# Patient Record
Sex: Female | Born: 1980 | Race: Black or African American | Hispanic: No | Marital: Single | Smoking: Current every day smoker
Health system: Southern US, Community
[De-identification: ages and names within clinical notes are randomized; demographics above are authoritative.]

## PROBLEM LIST (undated history)

## (undated) ENCOUNTER — Ambulatory Visit: Admission: EM | Payer: Medicare Other | Source: Home / Self Care

## (undated) DIAGNOSIS — R609 Edema, unspecified: Secondary | ICD-10-CM

## (undated) DIAGNOSIS — T884XXA Failed or difficult intubation, initial encounter: Secondary | ICD-10-CM

## (undated) DIAGNOSIS — E876 Hypokalemia: Secondary | ICD-10-CM

## (undated) DIAGNOSIS — R0600 Dyspnea, unspecified: Secondary | ICD-10-CM

## (undated) DIAGNOSIS — R55 Syncope and collapse: Secondary | ICD-10-CM

## (undated) DIAGNOSIS — R768 Other specified abnormal immunological findings in serum: Secondary | ICD-10-CM

## (undated) DIAGNOSIS — F329 Major depressive disorder, single episode, unspecified: Secondary | ICD-10-CM

## (undated) DIAGNOSIS — I890 Lymphedema, not elsewhere classified: Secondary | ICD-10-CM

## (undated) DIAGNOSIS — N840 Polyp of corpus uteri: Secondary | ICD-10-CM

## (undated) DIAGNOSIS — E559 Vitamin D deficiency, unspecified: Secondary | ICD-10-CM

## (undated) DIAGNOSIS — R600 Localized edema: Secondary | ICD-10-CM

## (undated) DIAGNOSIS — N920 Excessive and frequent menstruation with regular cycle: Secondary | ICD-10-CM

## (undated) DIAGNOSIS — G894 Chronic pain syndrome: Secondary | ICD-10-CM

## (undated) DIAGNOSIS — G8929 Other chronic pain: Secondary | ICD-10-CM

## (undated) DIAGNOSIS — M419 Scoliosis, unspecified: Secondary | ICD-10-CM

## (undated) DIAGNOSIS — G4733 Obstructive sleep apnea (adult) (pediatric): Secondary | ICD-10-CM

## (undated) DIAGNOSIS — K2970 Gastritis, unspecified, without bleeding: Secondary | ICD-10-CM

## (undated) DIAGNOSIS — F32A Depression, unspecified: Secondary | ICD-10-CM

## (undated) DIAGNOSIS — E538 Deficiency of other specified B group vitamins: Secondary | ICD-10-CM

## (undated) DIAGNOSIS — R7303 Prediabetes: Secondary | ICD-10-CM

## (undated) DIAGNOSIS — R0609 Other forms of dyspnea: Secondary | ICD-10-CM

## (undated) DIAGNOSIS — F419 Anxiety disorder, unspecified: Secondary | ICD-10-CM

## (undated) DIAGNOSIS — N3281 Overactive bladder: Secondary | ICD-10-CM

## (undated) DIAGNOSIS — I509 Heart failure, unspecified: Secondary | ICD-10-CM

## (undated) DIAGNOSIS — J45909 Unspecified asthma, uncomplicated: Secondary | ICD-10-CM

## (undated) DIAGNOSIS — R000 Tachycardia, unspecified: Secondary | ICD-10-CM

## (undated) DIAGNOSIS — M45A8 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: Secondary | ICD-10-CM

## (undated) DIAGNOSIS — R0902 Hypoxemia: Secondary | ICD-10-CM

## (undated) DIAGNOSIS — K219 Gastro-esophageal reflux disease without esophagitis: Secondary | ICD-10-CM

## (undated) DIAGNOSIS — Z9884 Bariatric surgery status: Secondary | ICD-10-CM

## (undated) DIAGNOSIS — M47817 Spondylosis without myelopathy or radiculopathy, lumbosacral region: Secondary | ICD-10-CM

## (undated) DIAGNOSIS — D509 Iron deficiency anemia, unspecified: Secondary | ICD-10-CM

## (undated) HISTORY — DX: Hypoxemia: R09.02

## (undated) HISTORY — PX: OTHER SURGICAL HISTORY: SHX169

## (undated) HISTORY — PX: HERNIA REPAIR: SHX51

## (undated) HISTORY — PX: TUBAL LIGATION: SHX77

---

## 2004-10-28 ENCOUNTER — Emergency Department: Payer: Self-pay | Admitting: Emergency Medicine

## 2004-10-28 IMAGING — CT CT HEAD WITHOUT CONTRAST
1 series · 16 of 28 positions shown, 20 images · non-contrast
Comparison: none

REASON FOR EXAM: Headache
COMMENTS:  LMP: N/A

PROCEDURE:     CT  - CT HEAD WITHOUT CONTRAST  - [DATE]  [DATE]
RESULT:     There is no evidence of intra-axial or extra-axial fluid
collections or evidence of acute hemorrhage. No secondary signs are
appreciated to suggest mass effect or subacute or chronic infarction.

[Series 2: without · axial · non-contrast · 0.42mm/px · z∈[-175,-50]mm · 16 of 28 slices shown, 20 images]
[im 2/28  brain]
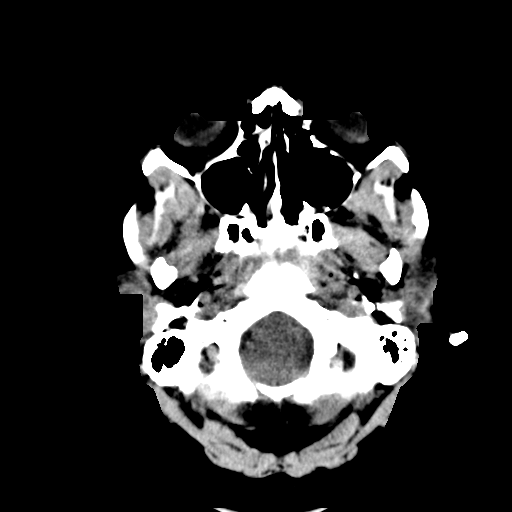
[im 2/28  bone]
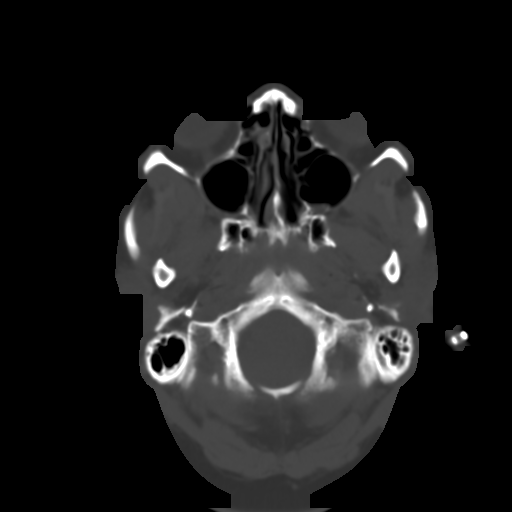
[im 4/28  brain]
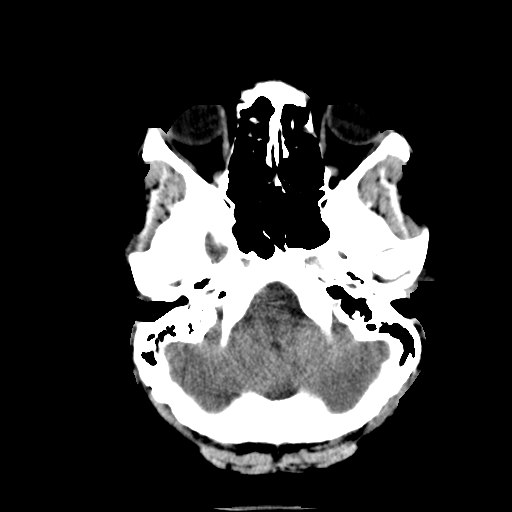
[im 6/28  brain]
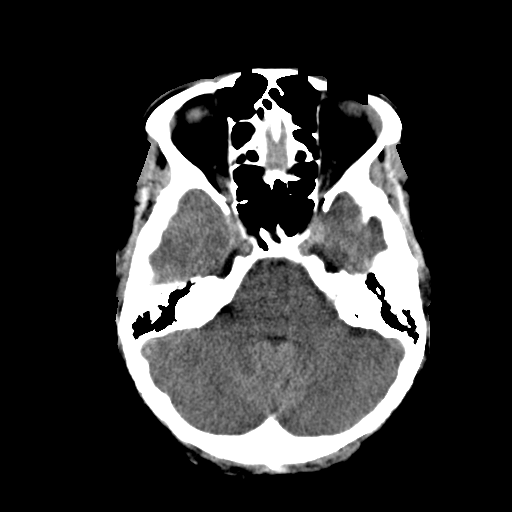
[im 7/28  brain]
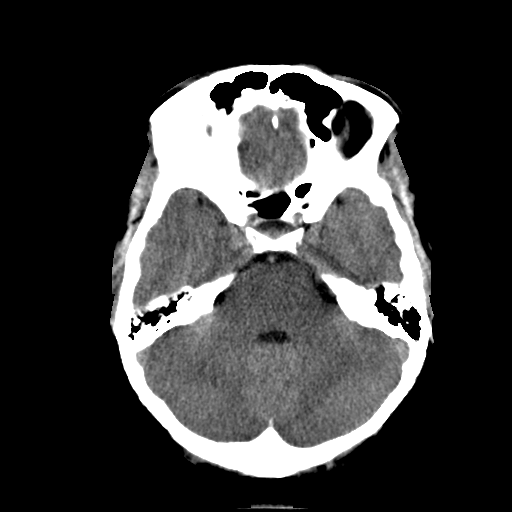
[im 9/28  brain]
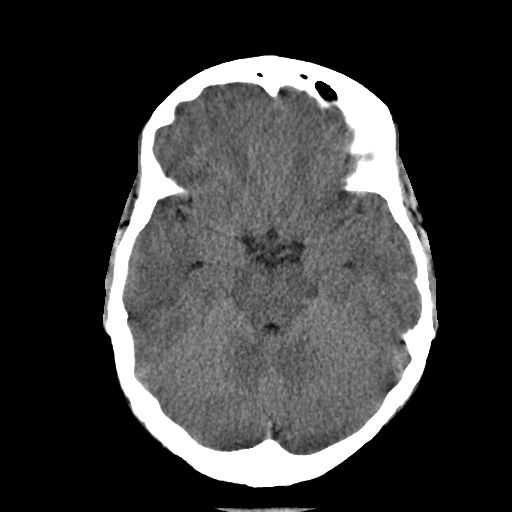
[im 9/28  bone]
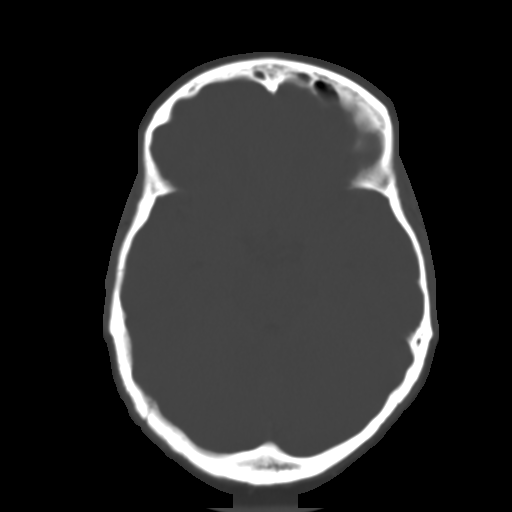
[im 10/28  brain]
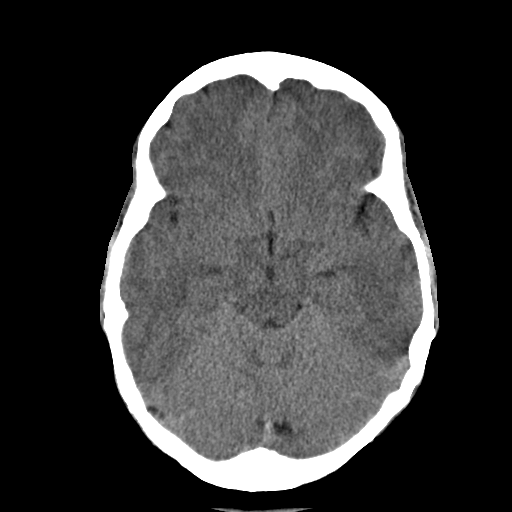
[im 12/28  brain]
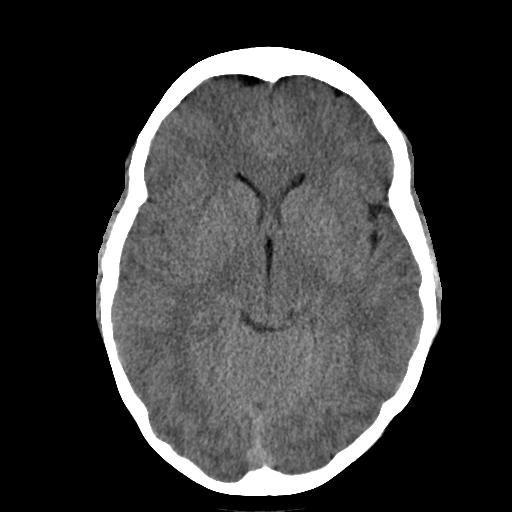
[im 14/28  brain]
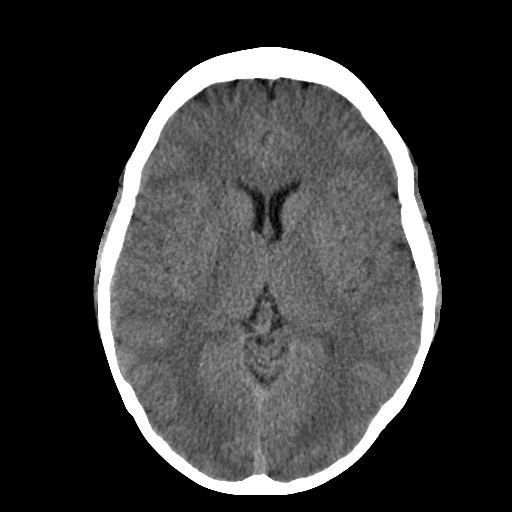
[im 15/28  brain]
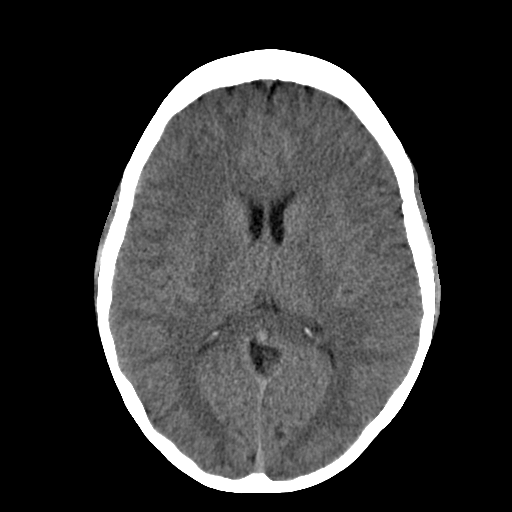
[im 15/28  bone]
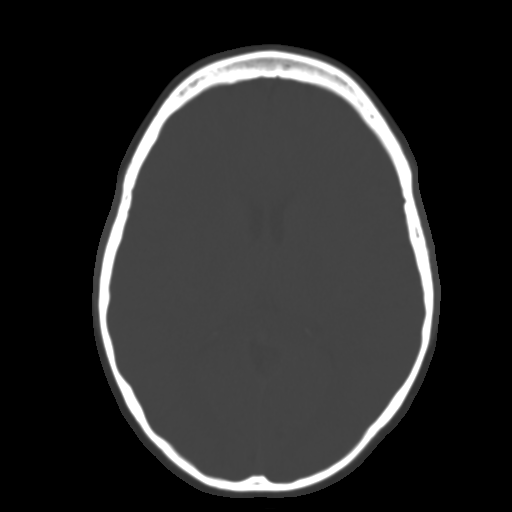
[im 17/28  brain]
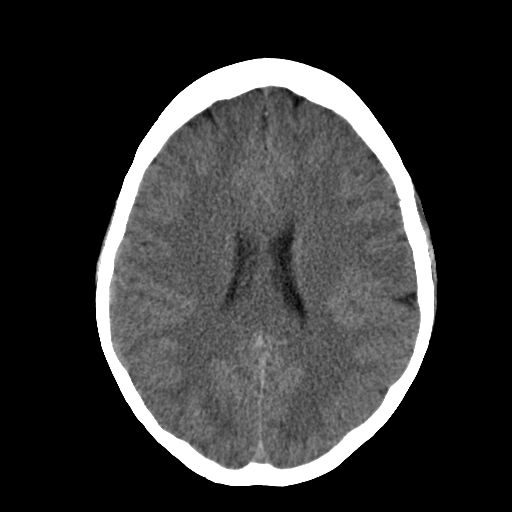
[im 19/28  brain]
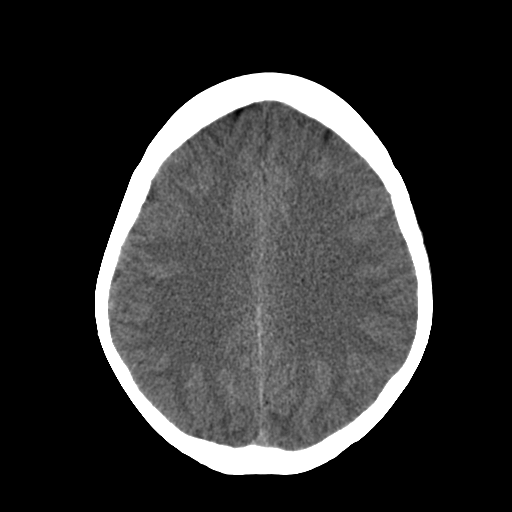
[im 20/28  brain]
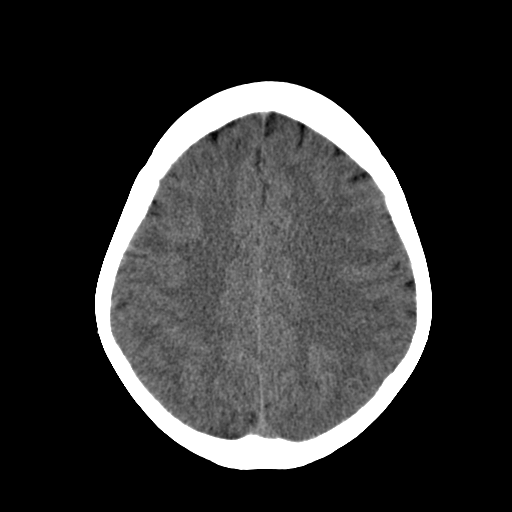
[im 22/28  brain]
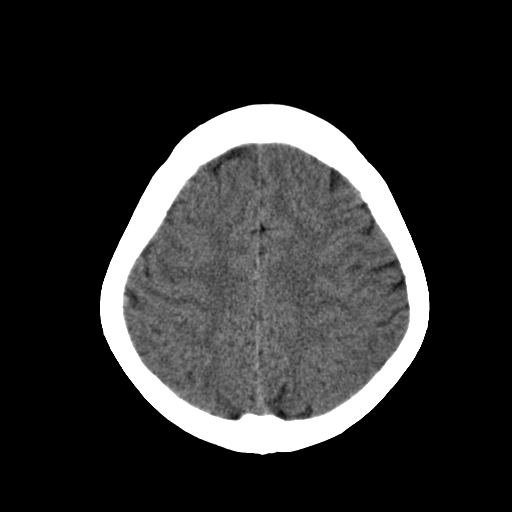
[im 22/28  bone]
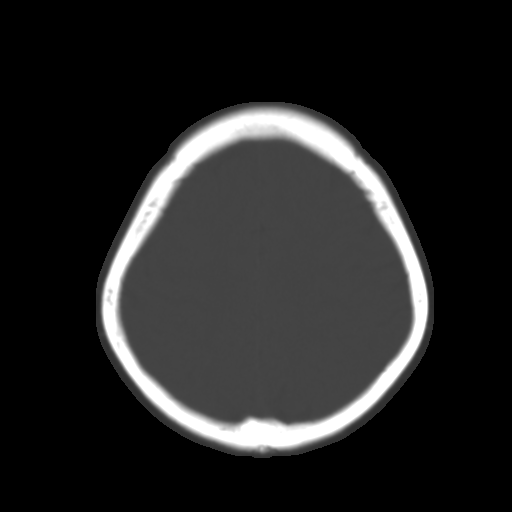
[im 23/28  brain]
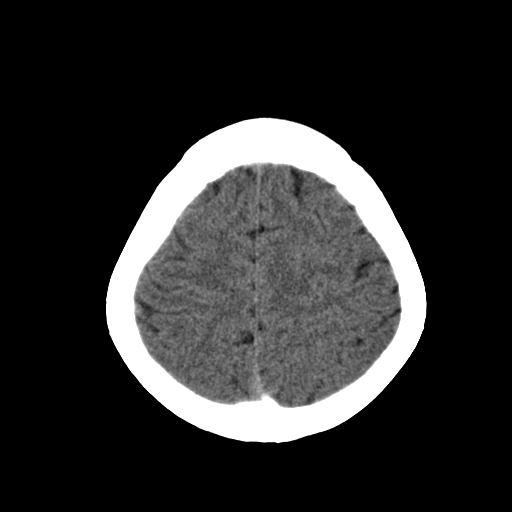
[im 25/28  brain]
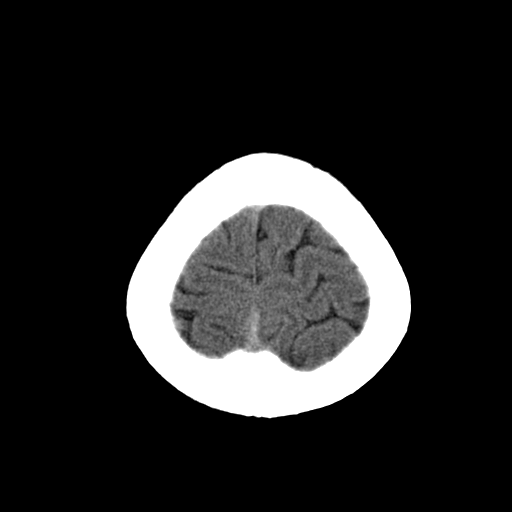
[im 27/28  brain]
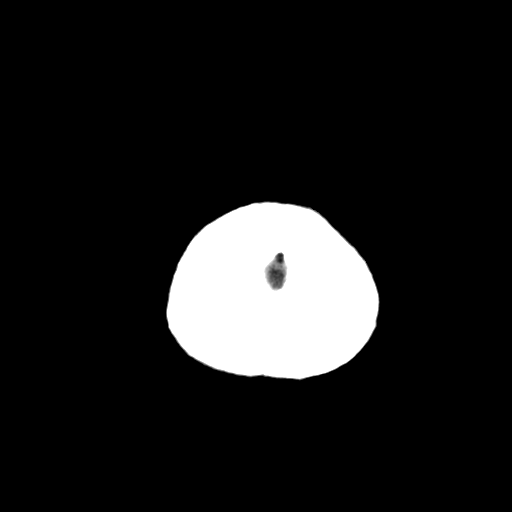

[16 of 28 positions shown; findings below may reference images not displayed]

IMPRESSION: 1.     Unremarkable emergent head CT as described above.
2.     Dr. TIGER of the Emergency Department was informed of these
findings at the time of the initial interpretation.

## 2006-01-06 ENCOUNTER — Emergency Department: Payer: Self-pay | Admitting: Emergency Medicine

## 2006-08-29 ENCOUNTER — Emergency Department: Payer: Self-pay | Admitting: Emergency Medicine

## 2006-11-26 ENCOUNTER — Ambulatory Visit: Payer: Self-pay | Admitting: Certified Nurse Midwife

## 2006-11-26 IMAGING — US US OB US >=[ID] SNGL FETUS
1 series · 17 of 28 positions shown · non-contrast
Comparison: none

REASON FOR EXAM: Anatomy and Placenta Location
COMMENTS:

[Series 1: us ob us >=(id) sngl fetus · 17 of 58 slices shown]
[im 1/58]
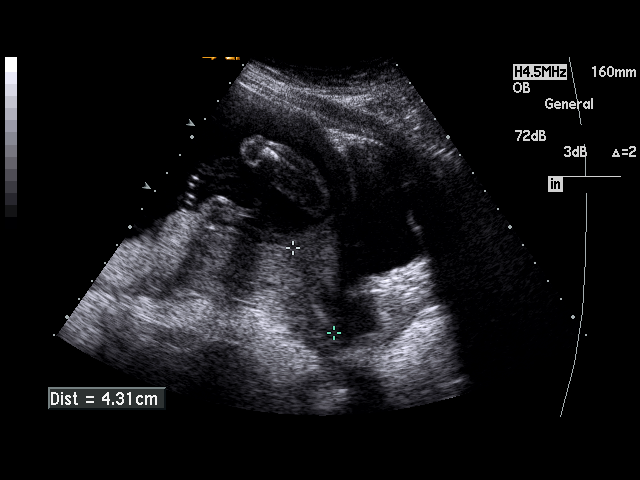
[im 5/58]
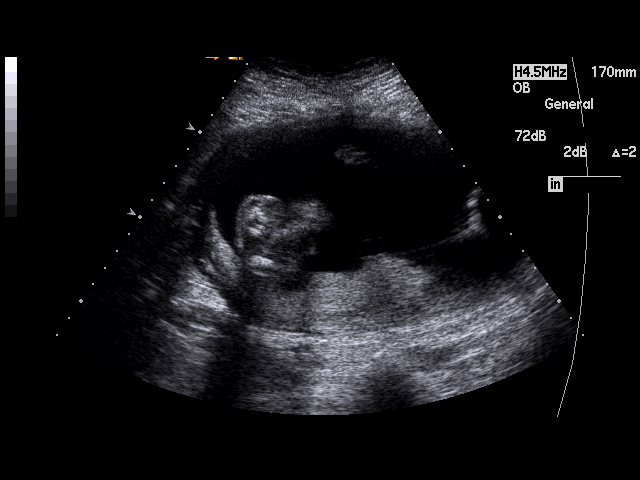
[im 9/58]
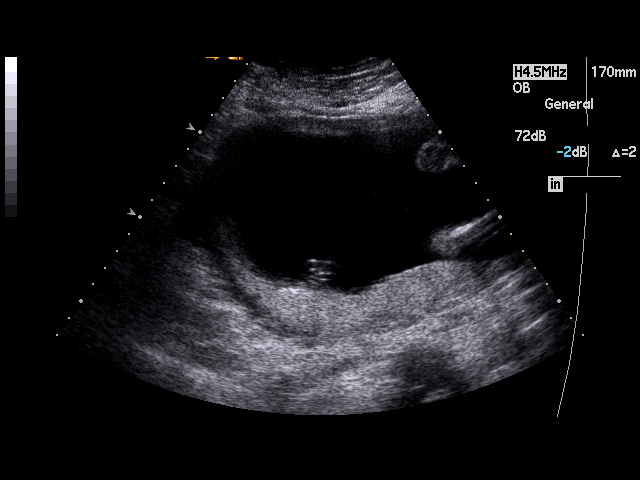
[im 11/58]
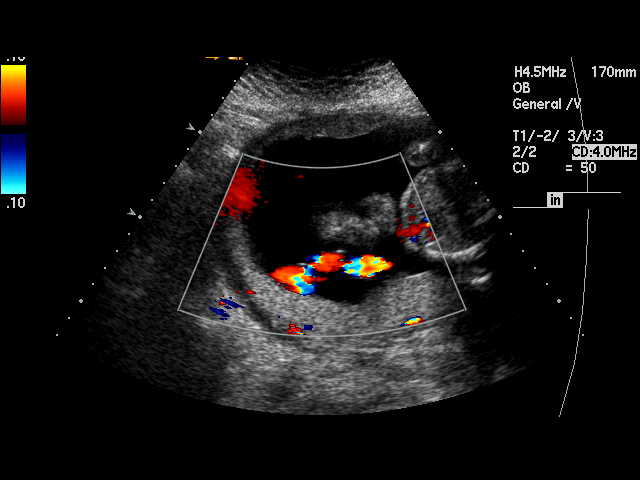
[im 15/58]
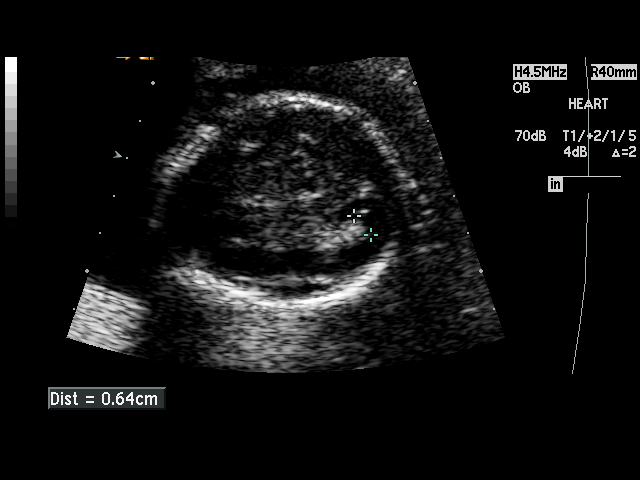
[im 20/58]
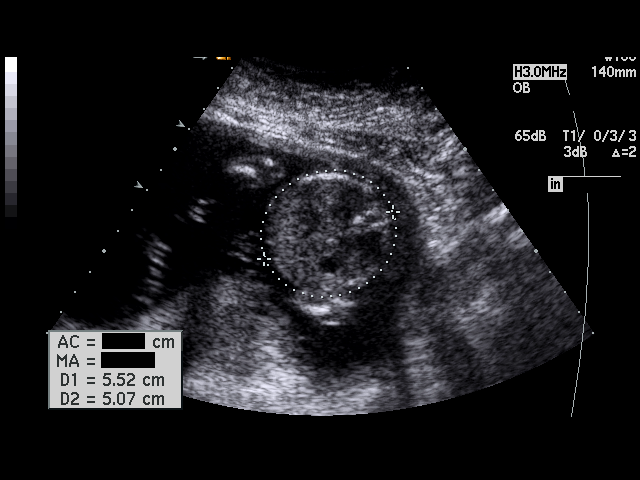
[im 22/58]
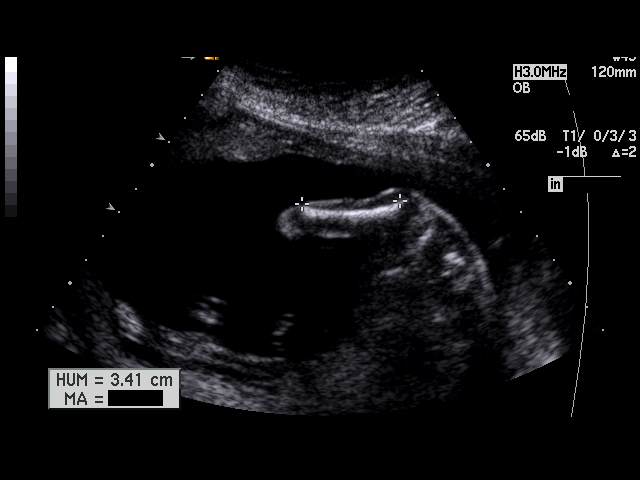
[im 26/58]
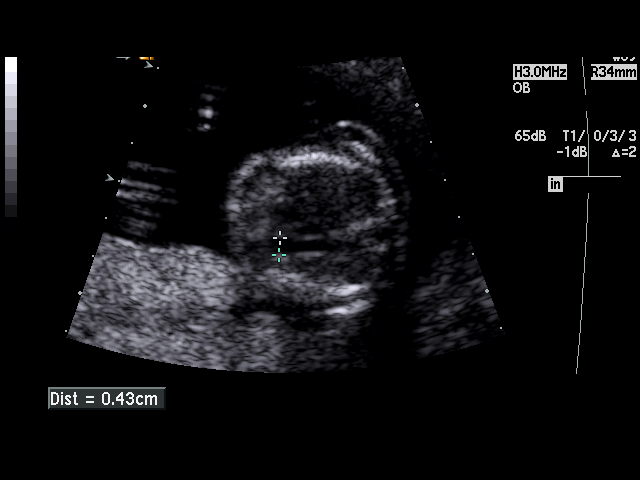
[im 30/58]
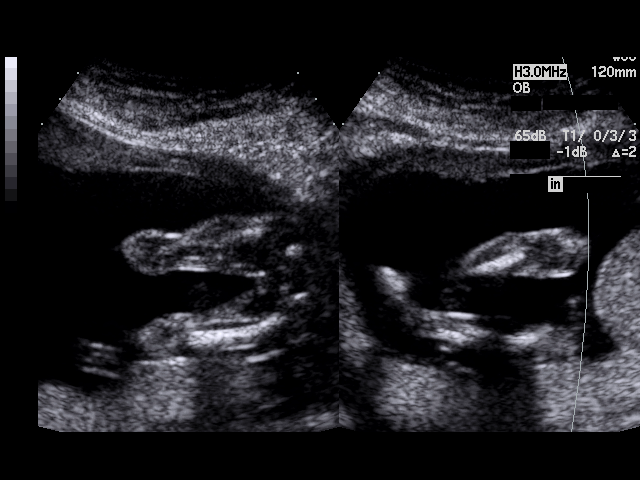
[im 32/58]
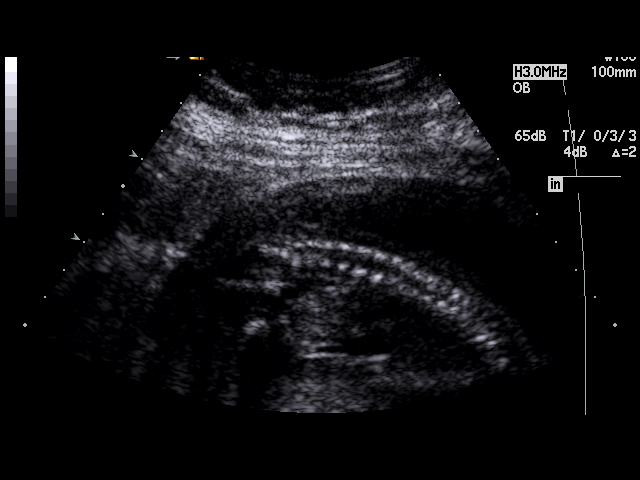
[im 36/58]
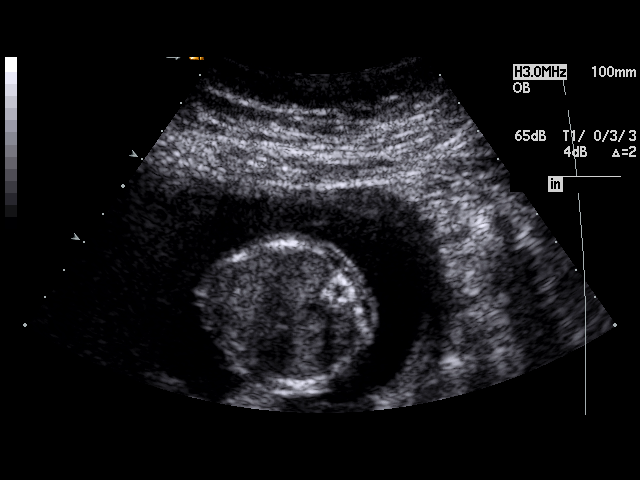
[im 39/58]
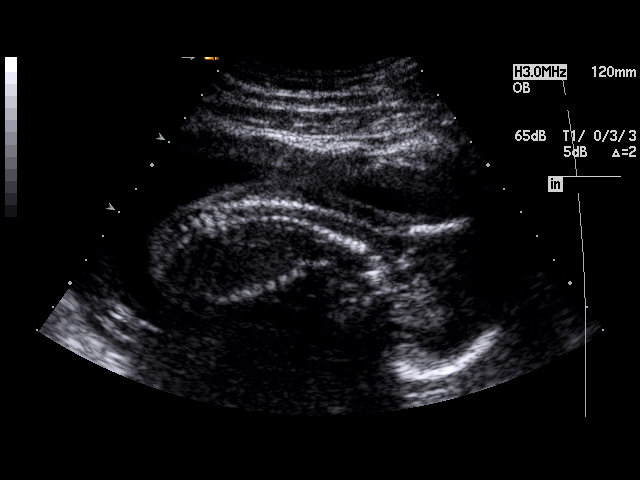
[im 43/58]
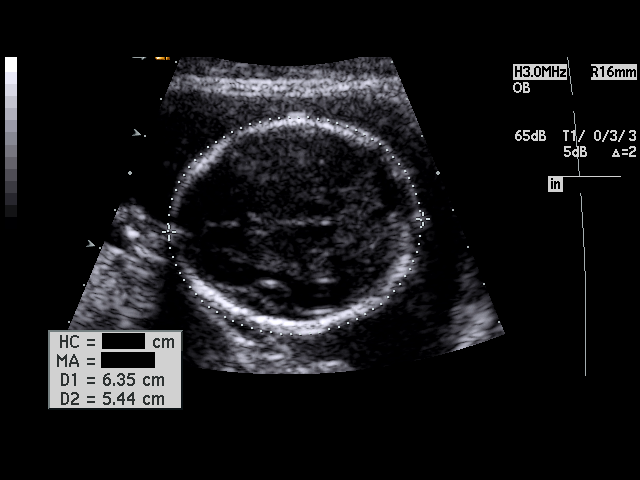
[im 47/58]
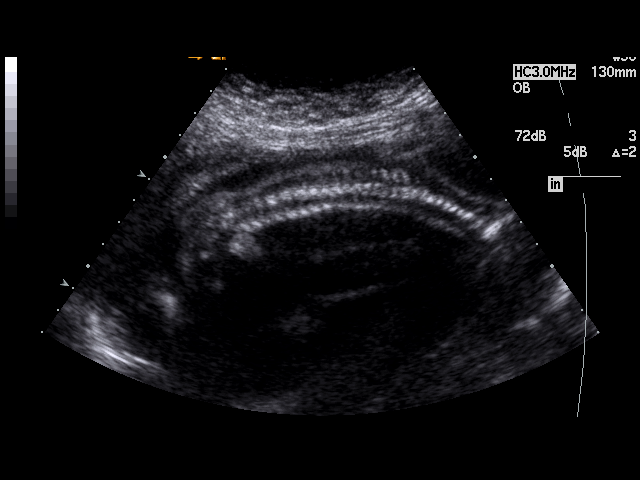
[im 49/58]
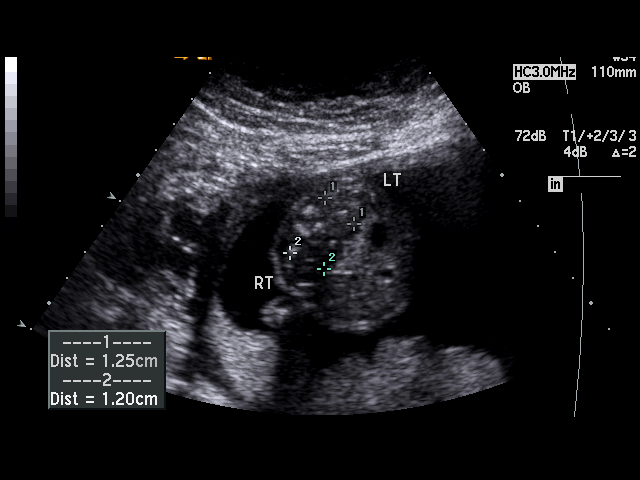
[im 53/58]
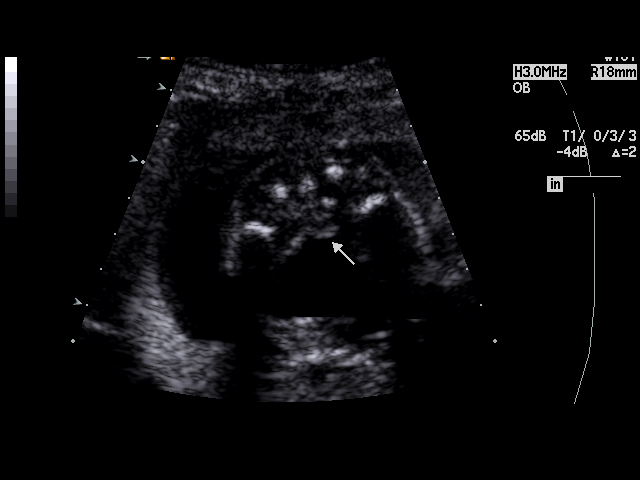
[im 58/58]
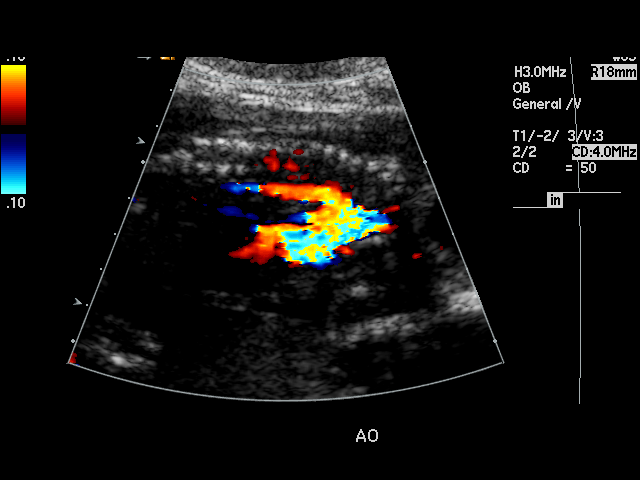

[17 of 28 positions shown; findings below may reference images not displayed]

PROCEDURE:     US  - US OB GREATER/OR EQUAL TO [XU]  - [DATE] [DATE]

RESULT:     There is noted a single living intrauterine gestation.
Presentation is variable during the course of this exam. Fetal heart rate
was monitored at 141 beats per minute. Amnionic fluid volume appears normal.
The placenta is posterior, to the RIGHT and high. The fetal heart, stomach,
and urinary bladder are visualized. No hydrocephalus or hydronephrosis is
seen. Fetal measurements are as follows:

BPD: 5.06 cm (21 [XU] days)
HC: 18.56 cm (20 [XU] days)
AC: 16.43 cm (21 [XU] days)
FL 3.27 cm (20 [XU] days)

EFW = 414 grams. Average ultrasound age is 21 [XU] day. Ultrasound EDD is
[DATE].
IMPRESSION: 1.     Please see above.

## 2007-02-06 ENCOUNTER — Ambulatory Visit: Payer: Self-pay | Admitting: Certified Nurse Midwife

## 2007-02-06 IMAGING — US US OB US >=[ID] SNGL FETUS
1 series · 17 of 28 positions shown · non-contrast
Comparison: none

REASON FOR EXAM: EFW  growth
COMMENTS:

[Series 1: us ob us >=(id) sngl fetus · 17 of 37 slices shown]
[im 1/37]
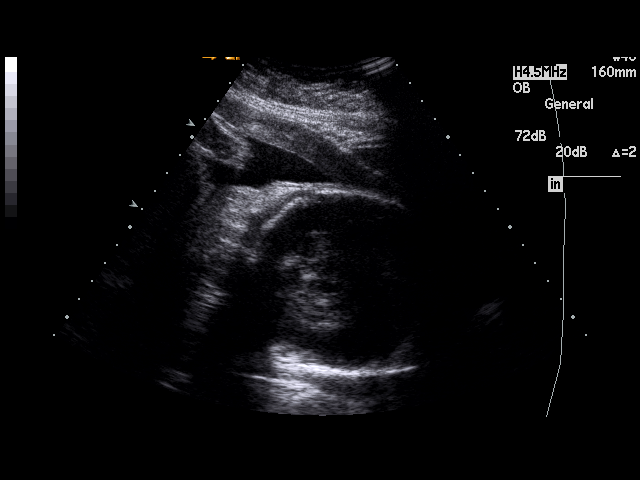
[im 3/37]
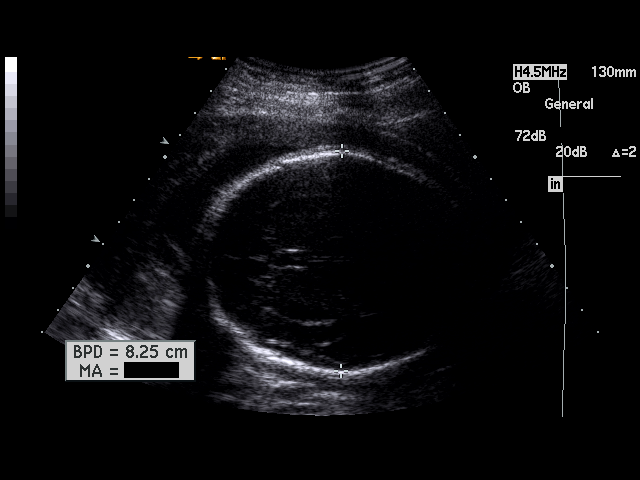
[im 6/37]
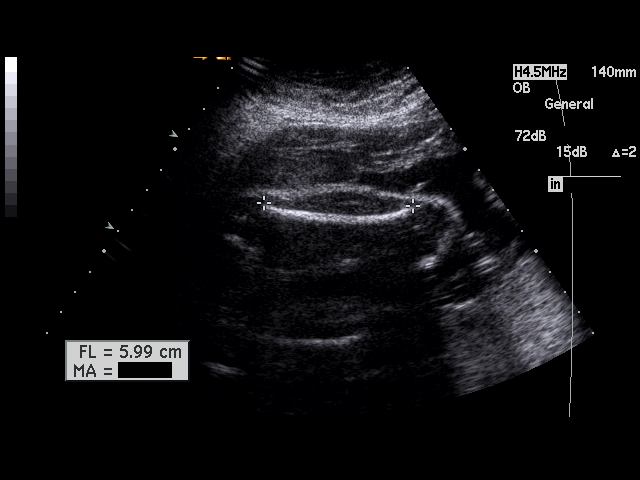
[im 7/37]
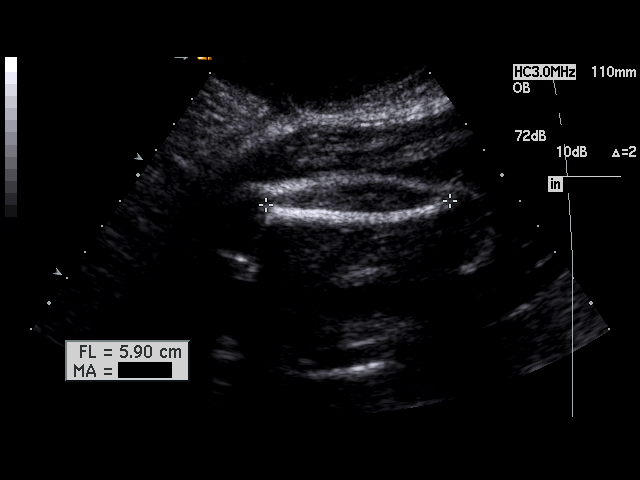
[im 10/37]
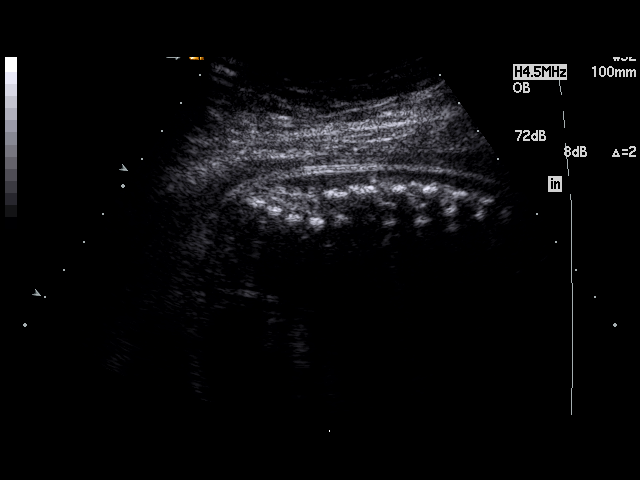
[im 13/37]
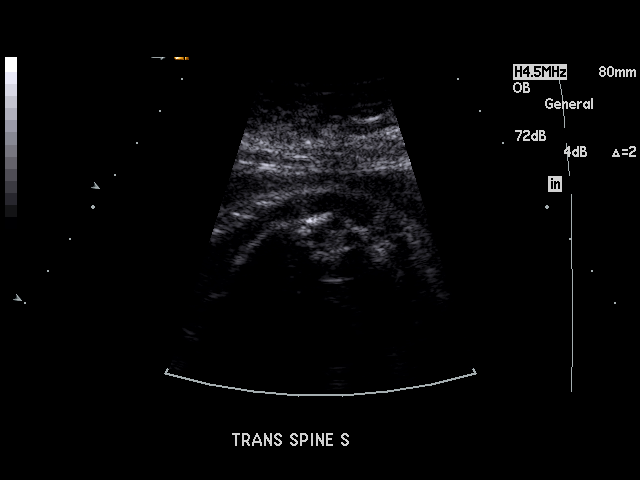
[im 14/37]
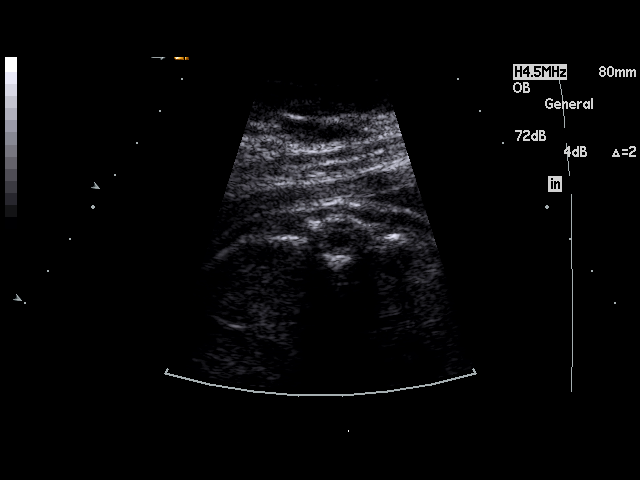
[im 17/37]
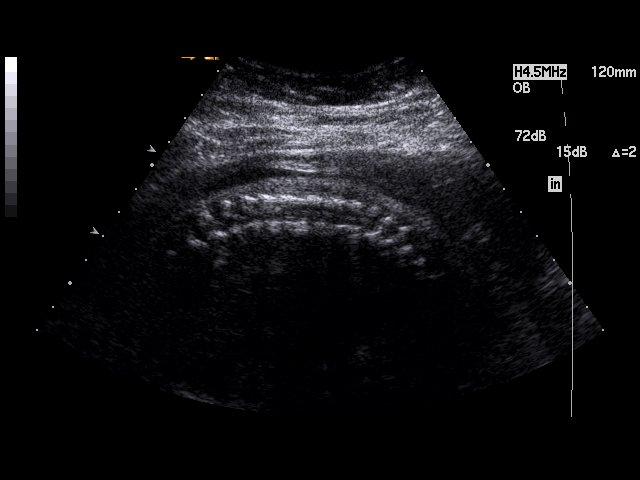
[im 19/37]
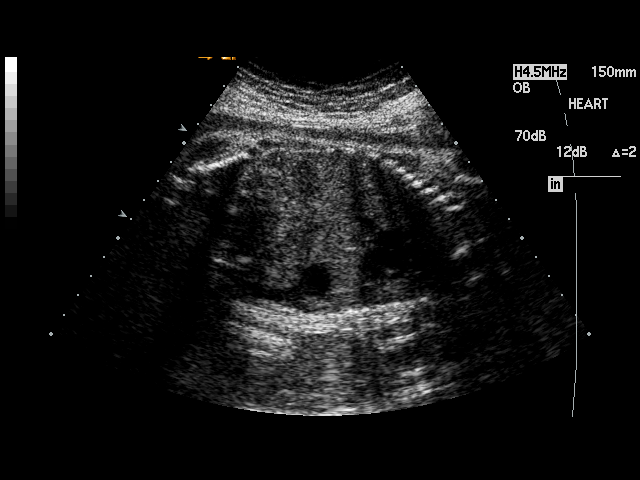
[im 21/37]
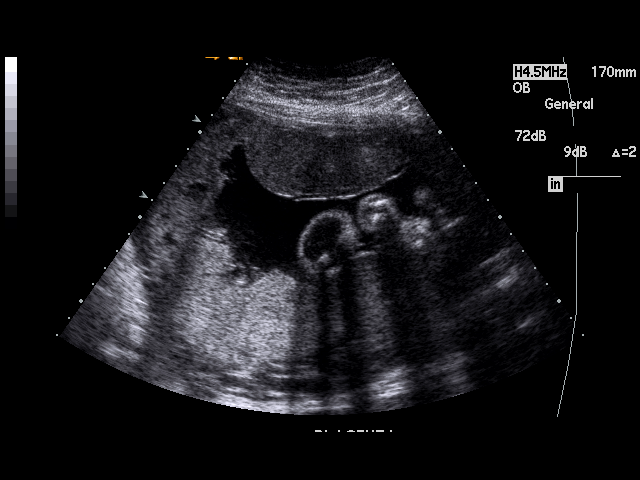
[im 23/37]
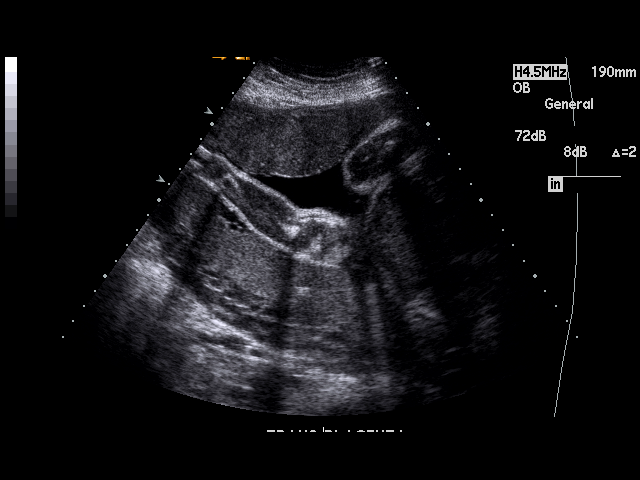
[im 25/37]
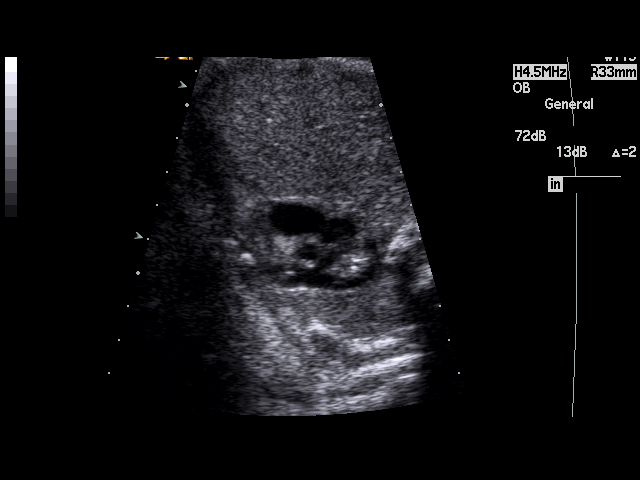
[im 27/37]
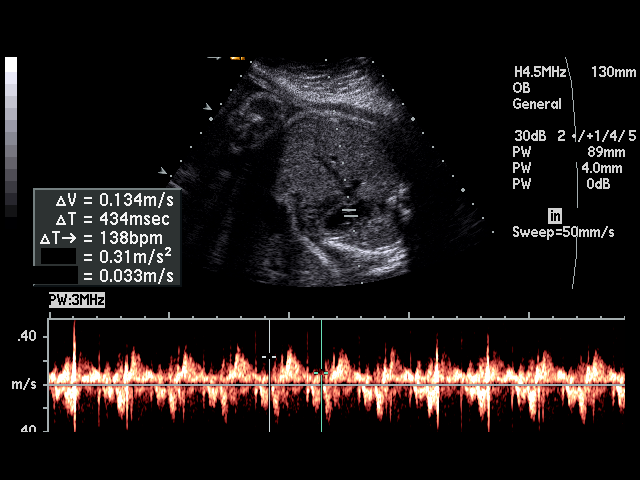
[im 30/37]
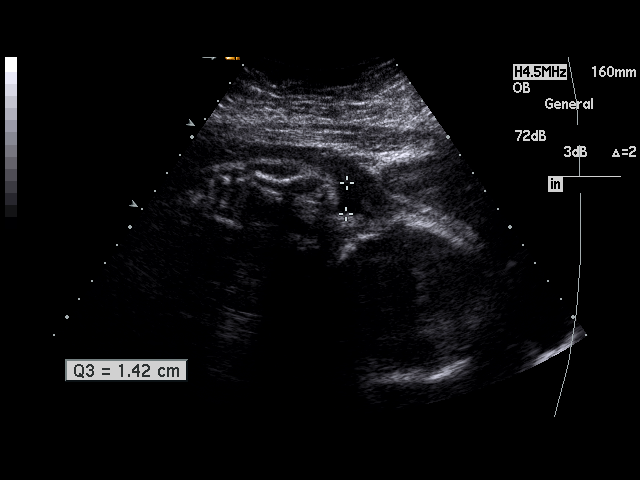
[im 31/37]
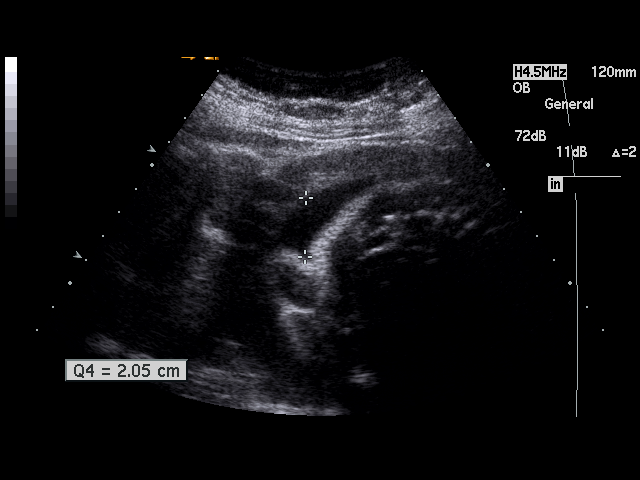
[im 34/37]
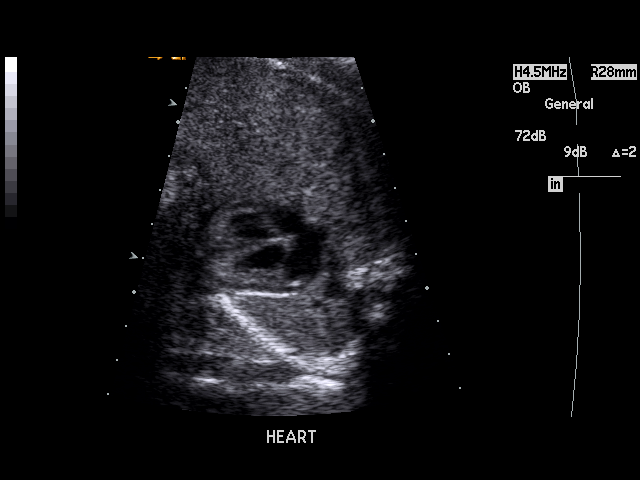
[im 37/37]
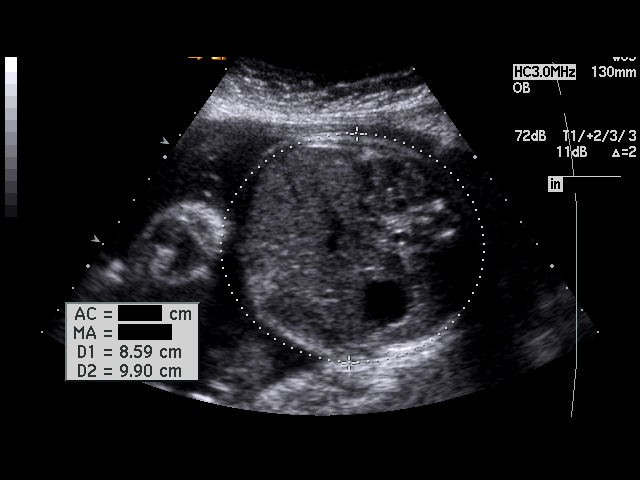

[17 of 28 positions shown; findings below may reference images not displayed]

PROCEDURE:     US  - US OB GREATER/OR EQUAL TO [W3]  - [DATE]  [DATE]

RESULT:     There is observed a living intrauterine gestation. Presentation
currently is cephalic. Amnionic fluid volume appears normal. The placenta is
fundal and to the RIGHT.  Fetal heart rate was monitored at 138 beats per
minute. The fetal heart, stomach and urinary bladder are visualized.  No
hydrocephalus or hydronephrosis is seen. Fetal measurements are as follows:

BPD: 8.21 cm (33 [W3] days)
HC: 29.44 cm (32 [W3] days)
AC: 29.09 cm (33 [W3] day)
FL: 5.91 cm (30 [W3] days)

EFW is 1,957 grams.  AFI measures 11.49 cm which is between the 5th and 50th
percentile. Average ultrasound age based on today's measurements is 32
[W3] days. Ultrasound EDD is [DATE]. This estimation as compared
with previous ultrasound EDD report on the prior exam of [DATE] is
compatible with appropriate interval growth.
IMPRESSION: 1.     Please see above.

## 2007-04-14 ENCOUNTER — Observation Stay: Payer: Self-pay | Admitting: Obstetrics and Gynecology

## 2007-04-14 ENCOUNTER — Inpatient Hospital Stay: Payer: Self-pay

## 2007-05-16 ENCOUNTER — Inpatient Hospital Stay: Payer: Self-pay | Admitting: Vascular Surgery

## 2007-05-16 IMAGING — CT CT ABD-PELV W/ CM
1 of 2 series · 15 of 32 positions shown, 19 images · non-contrast
Comparison: none

REASON FOR EXAM: (1) abd pain; (2) abd pain
COMMENTS:

PROCEDURE:     CT  - CT ABDOMEN / PELVIS  W  - [DATE]  [DATE]
RESULT:     Comparison: No available comparison exam.
TECHNIQUE: CT examination of the abdomen and pelvis was performed after
intravenous administration of 100 ml of [29] nonionic contrast in
addition to oral contrast. Collimation is 3 mm.

[Series 2: abd/pelvis · axial · 0.83mm/px · z∈[-416,-47]mm · 15 of 135 slices shown, 19 images]
[im 6/135  soft-tissue]
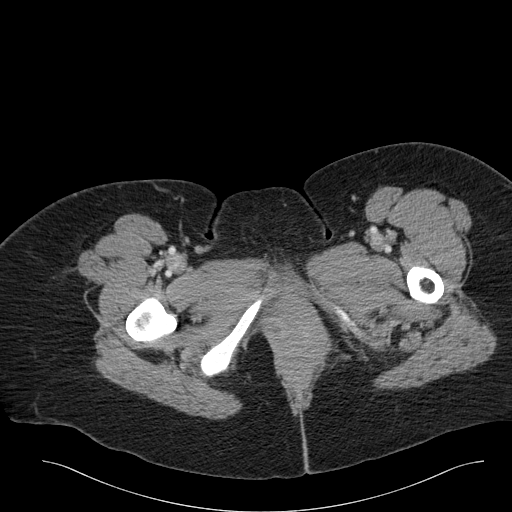
[im 6/135  bone]
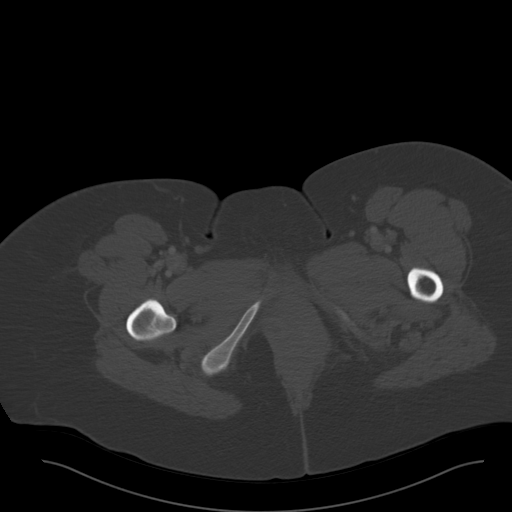
[im 17/135  soft-tissue]
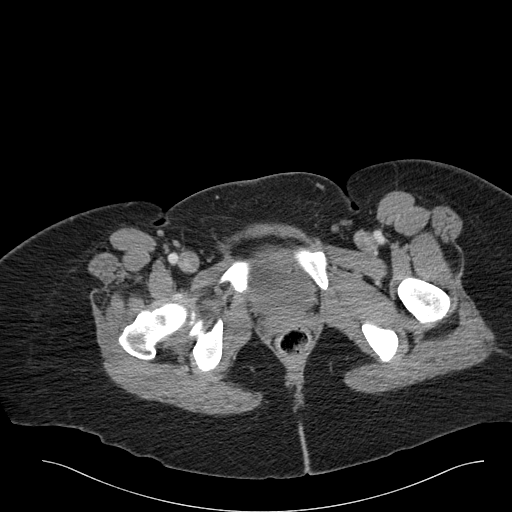
[im 28/135  soft-tissue]
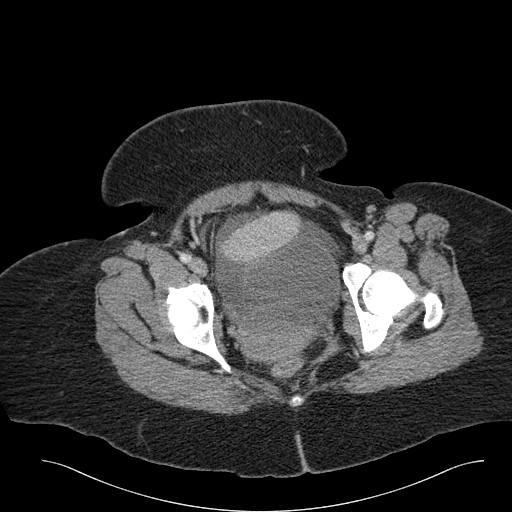
[im 40/135  soft-tissue]
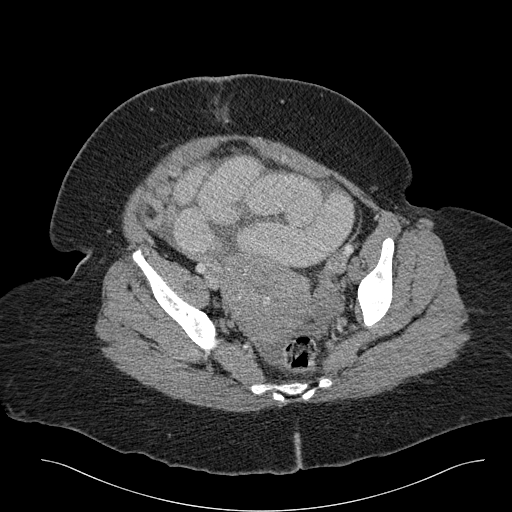
[im 45/135  soft-tissue]
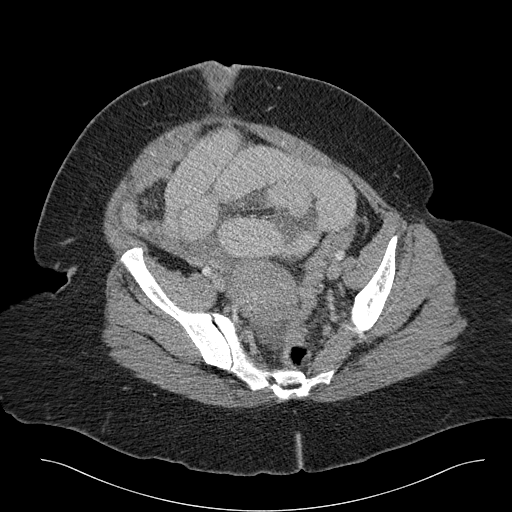
[im 56/135  soft-tissue]
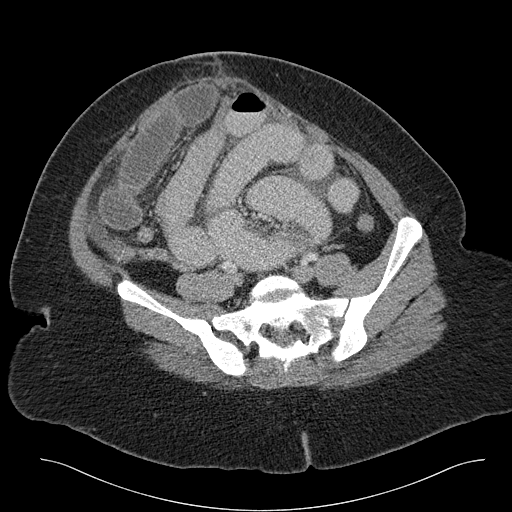
[im 68/135  soft-tissue]
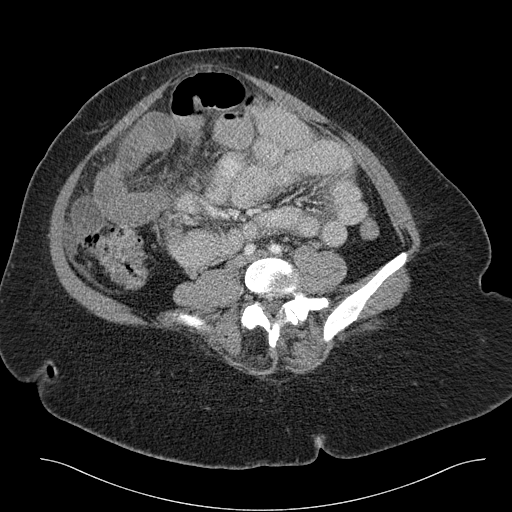
[im 79/135  soft-tissue]
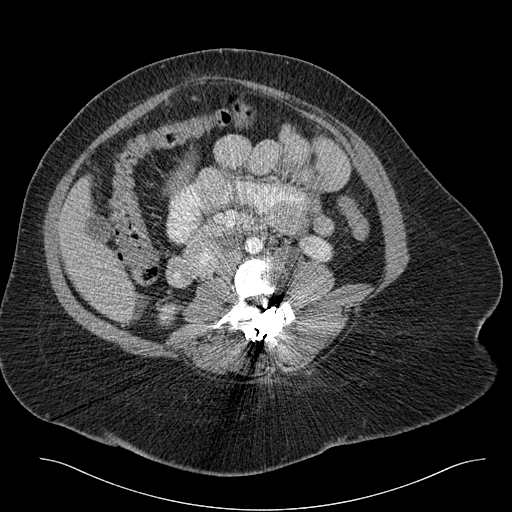
[im 90/135  soft-tissue]
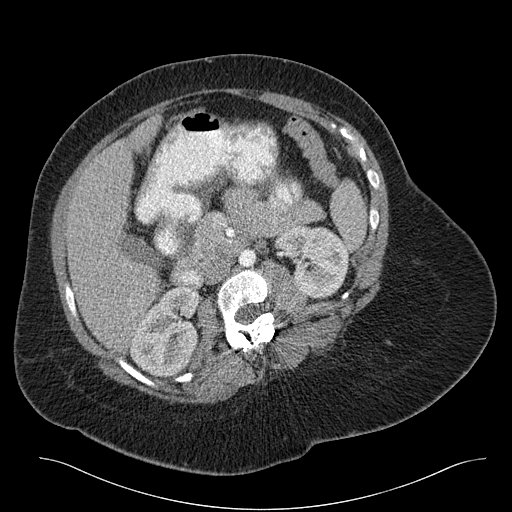
[im 90/135  bone]
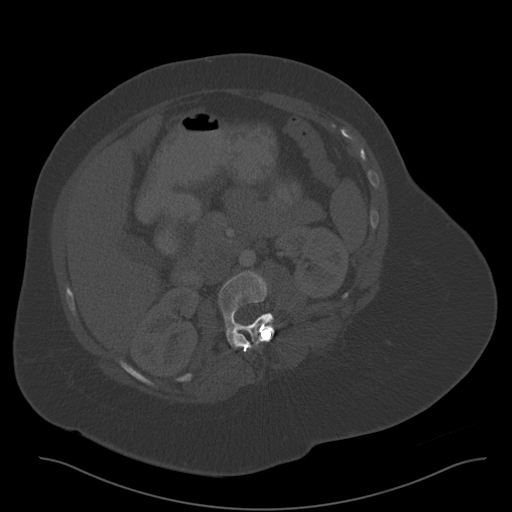
[im 95/135  soft-tissue]
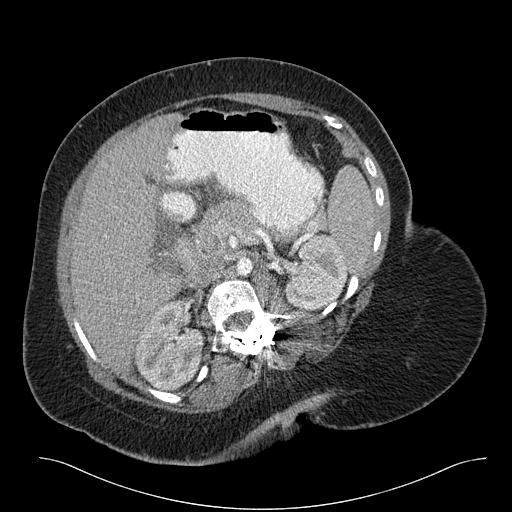
[im 107/135  soft-tissue]
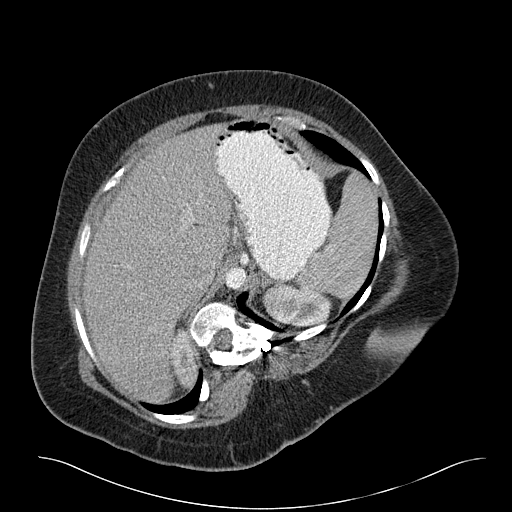
[im 112/135  lung]
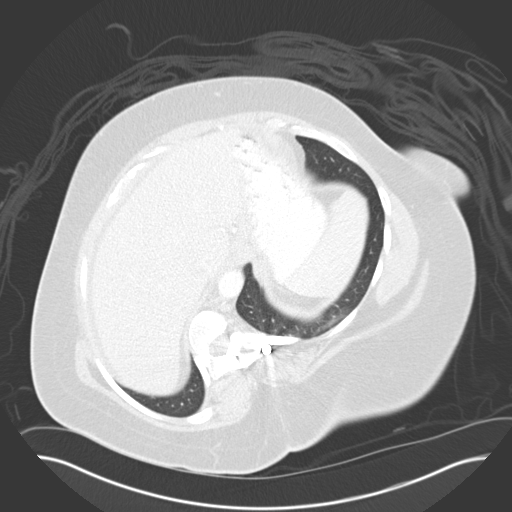
[im 118/135  soft-tissue]
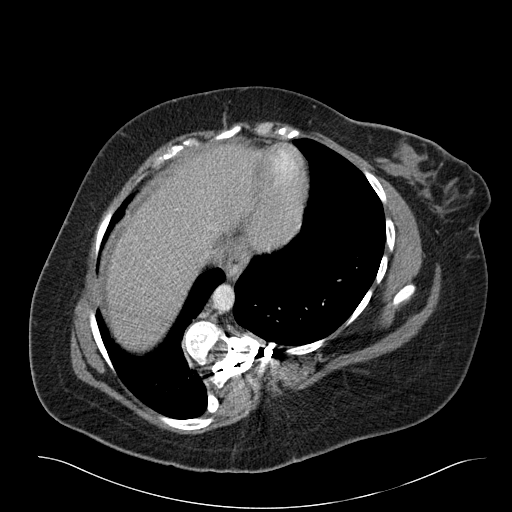
[im 118/135  lung]
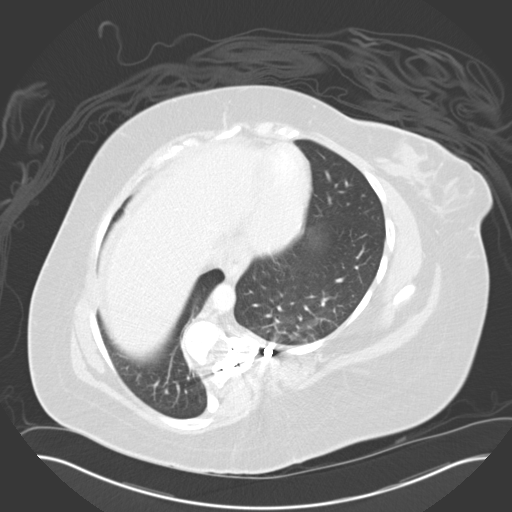
[im 123/135  lung]
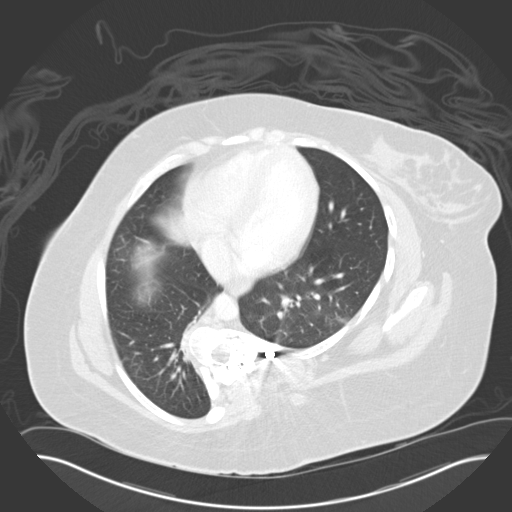
[im 129/135  soft-tissue]
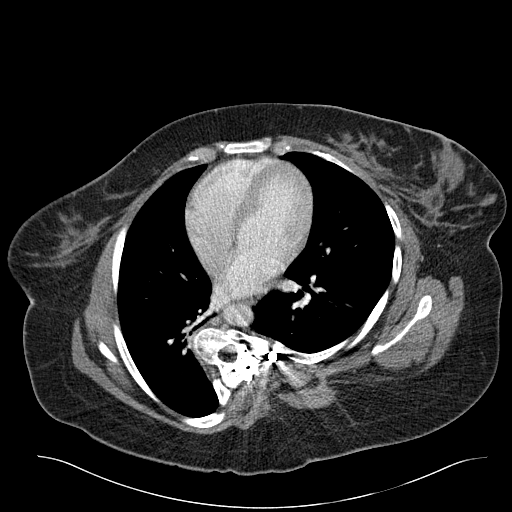
[im 129/135  lung]
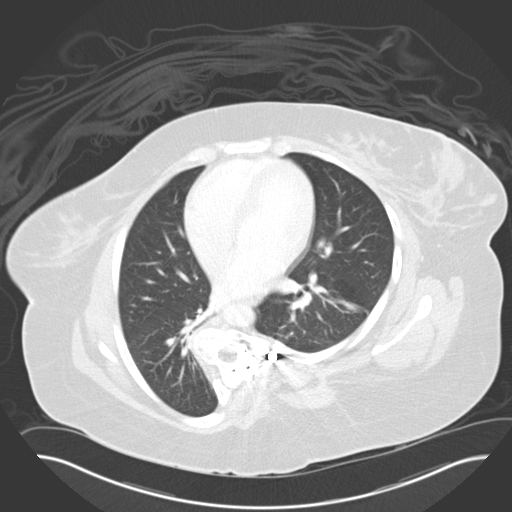

[15 of 32 positions shown; findings below may reference images not displayed]

FINDINGS: Limited evaluation of the lung bases is unremarkable.

A small calcified gallstone is noted. The liver, spleen, pancreas, adrenal
glands, and kidneys are unremarkable.

There is mild dilatation of multiple loops of small bowel. The distal-most
small bowel is decompressed. Findings are suggestive of small bowel
obstruction. Transition point is noted at the periumbilical hernia
containing a mildly dilated loop of small bowel. Minimal fat stranding is
seen in the region of the hernia. A small amount of abdominal free down and
pelvic free fluid is noted. There is no intraperitoneal free air. There is
no pneumatosis. There is no discrete fluid collection to suggest an abscess.
The appendix is unremarkable. No enlarged abdominal or pelvic lymph nodes
are noted. The uterus is present.

There is moderate dextro-convex curvature of the thoracolumbar spine.
Postsurgical changes are seen involving the thoracolumbar spine. Hardware
involving the thoracolumbar spine are partially visualized.
IMPRESSION: 1. Findings are suggestive of small bowel obstruction secondary to
incarcerated right periumbilical hernia that contains a loop of mildly
dilated small bowel. Small amount of ascites is noted.

2. A small gallstone is noted within the gallbladder.

Preliminary report was made available to the clinical service and faxed to
the emergency room by the night radiologist shortly after the study was
performed.

## 2008-09-13 ENCOUNTER — Emergency Department: Payer: Self-pay | Admitting: Emergency Medicine

## 2010-02-24 ENCOUNTER — Emergency Department: Payer: Self-pay | Admitting: Emergency Medicine

## 2010-02-24 IMAGING — CT CT ABD-PELV W/ CM
1 of 2 series · 15 of 32 positions shown, 19 images · non-contrast
Comparison: none

REASON FOR EXAM: (1) abdominal pain, hx of hernia; (2) abdominal pain, hx
of hernia
COMMENTS:

[Series 2: 3mm soft tissue · axial · 0.93mm/px · z∈[-944,-562]mm · 15 of 141 slices shown, 19 images]
[im 7/141  soft-tissue]
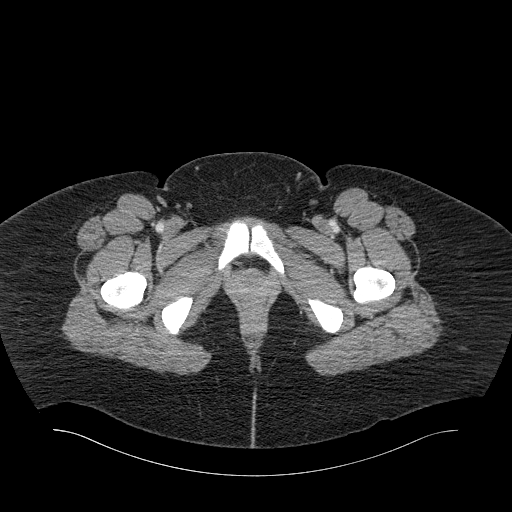
[im 7/141  bone]
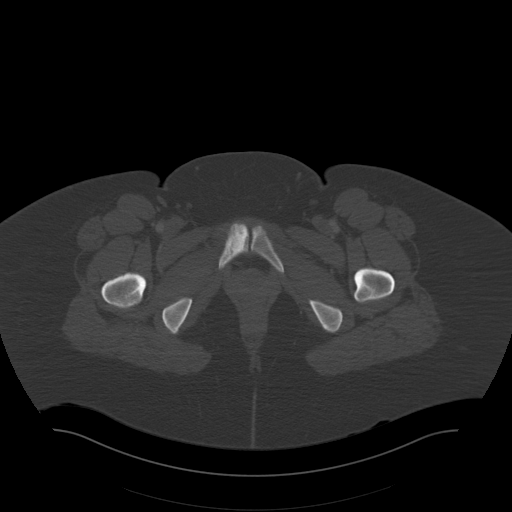
[im 19/141  soft-tissue]
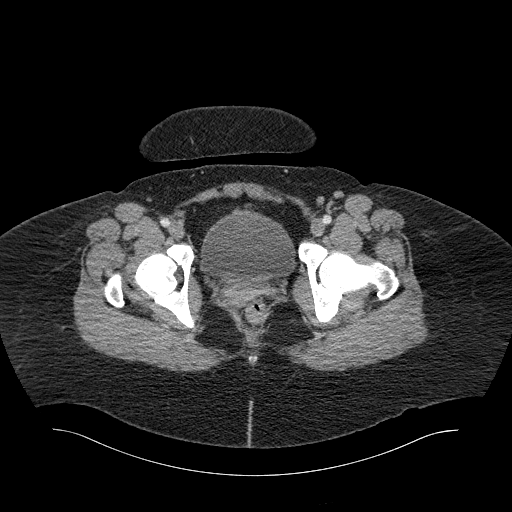
[im 31/141  soft-tissue]
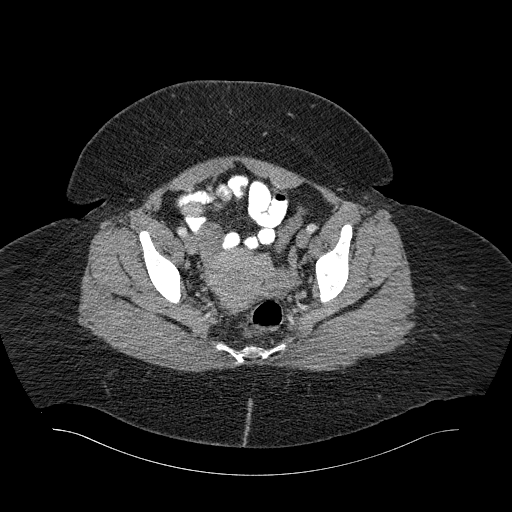
[im 37/141  soft-tissue]
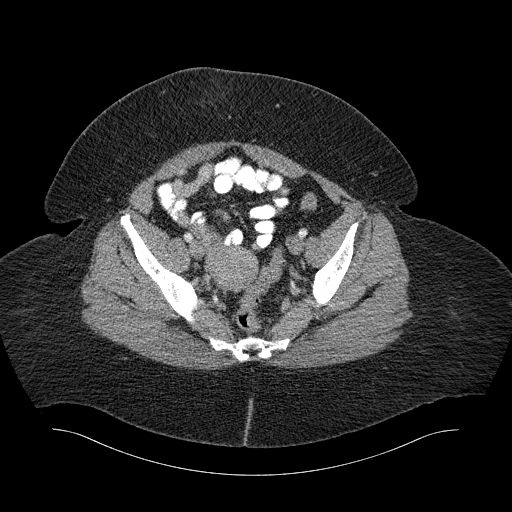
[im 49/141  soft-tissue]
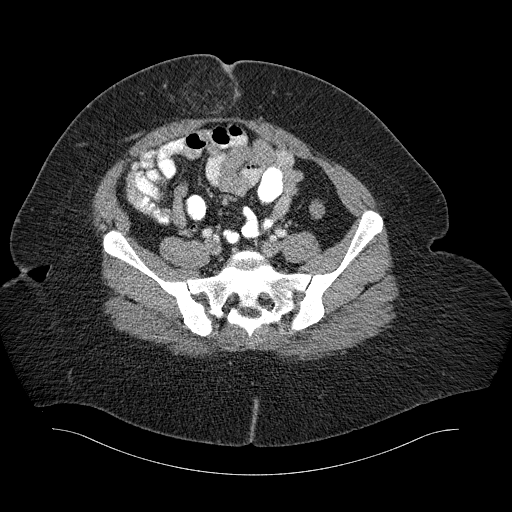
[im 61/141  soft-tissue]
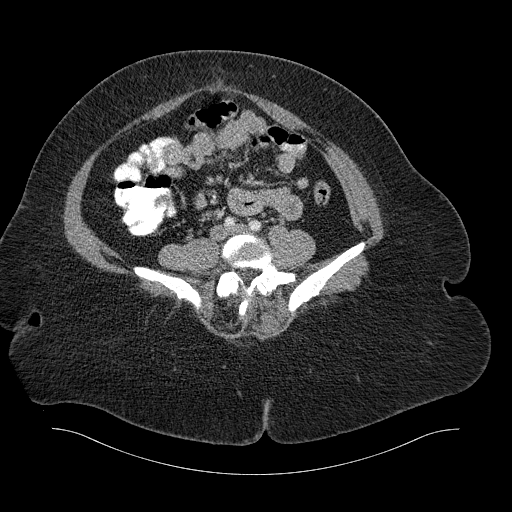
[im 74/141  soft-tissue]
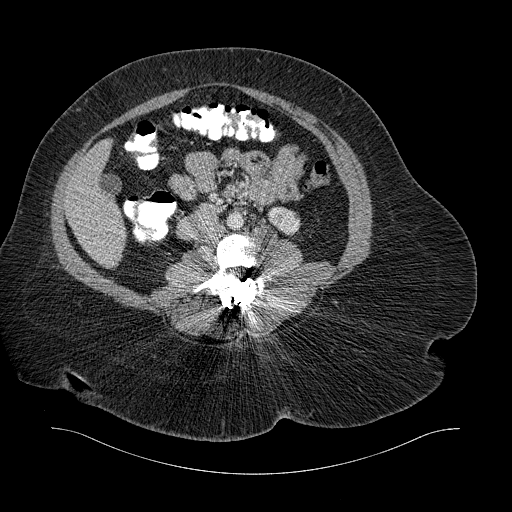
[im 80/141  soft-tissue]
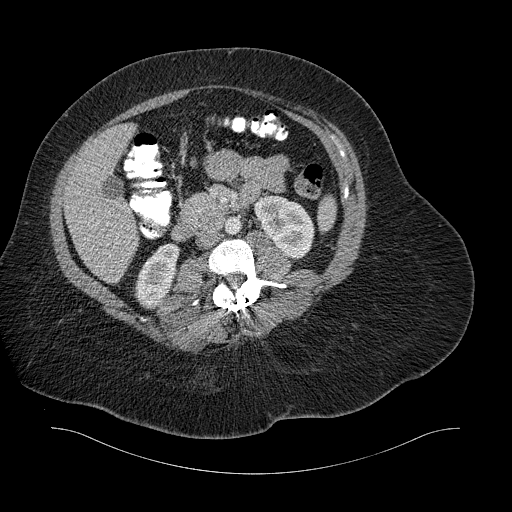
[im 92/141  soft-tissue]
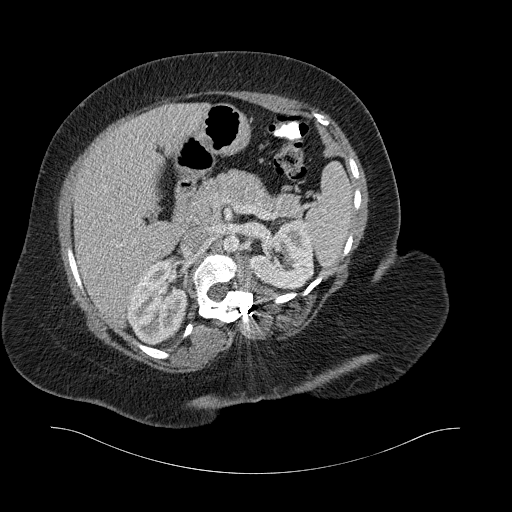
[im 92/141  bone]
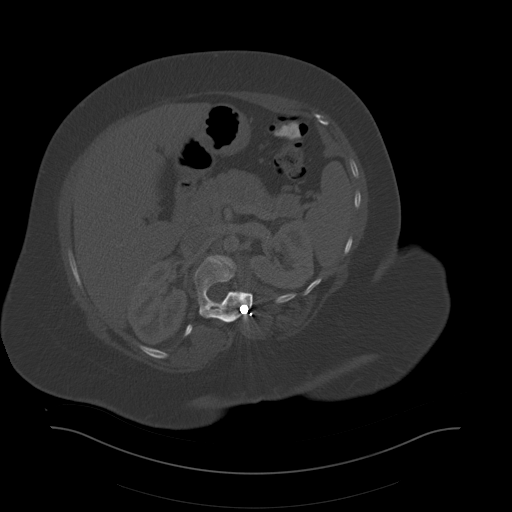
[im 104/141  soft-tissue]
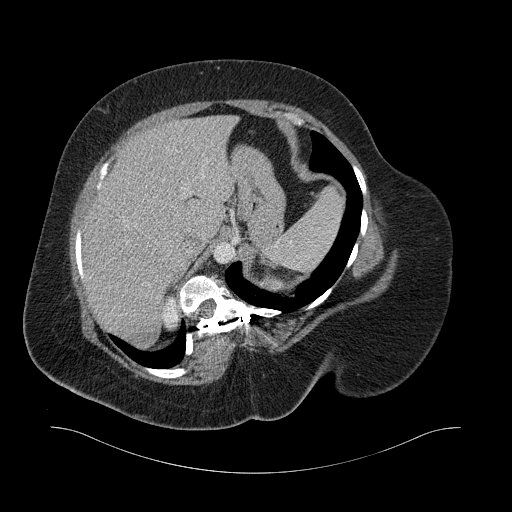
[im 110/141  soft-tissue]
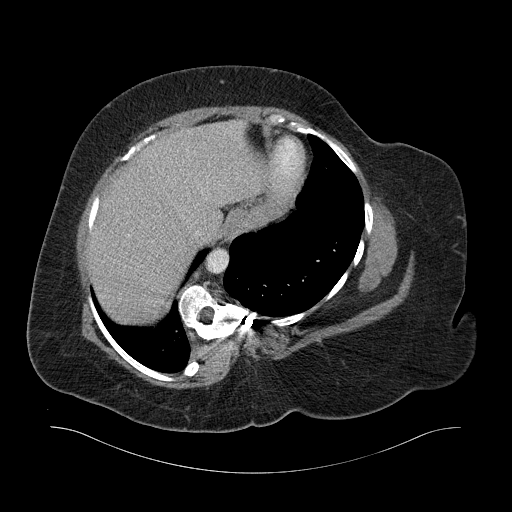
[im 116/141  lung]
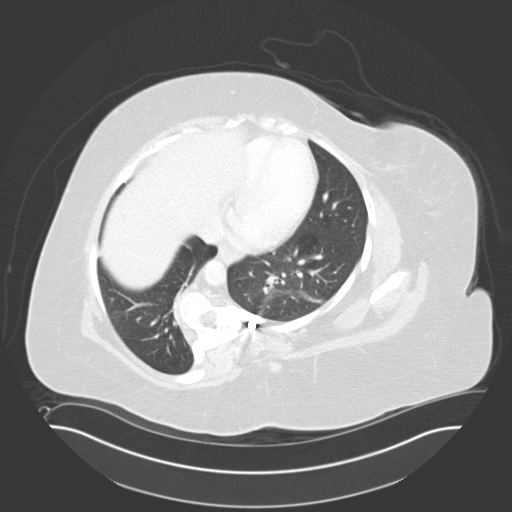
[im 122/141  soft-tissue]
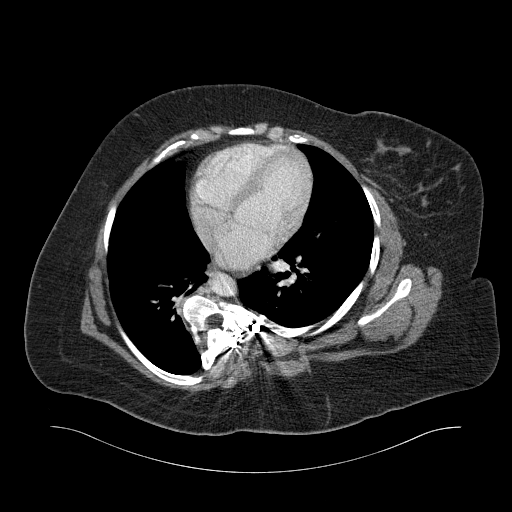
[im 122/141  lung]
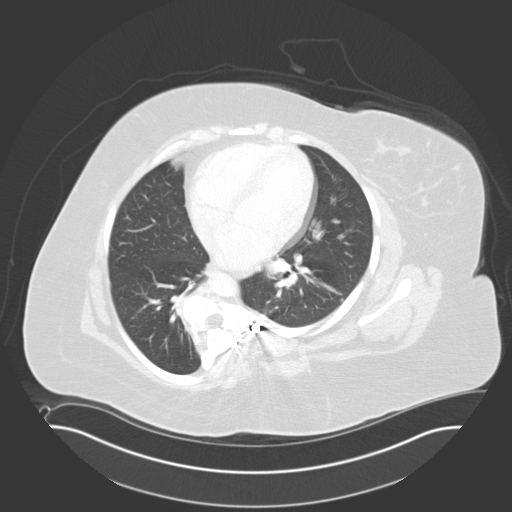
[im 128/141  lung]
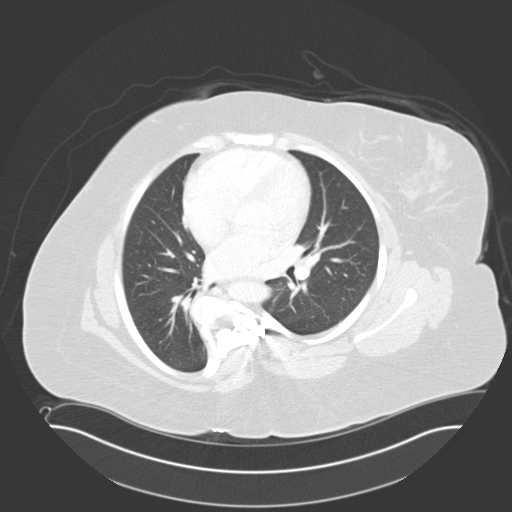
[im 134/141  soft-tissue]
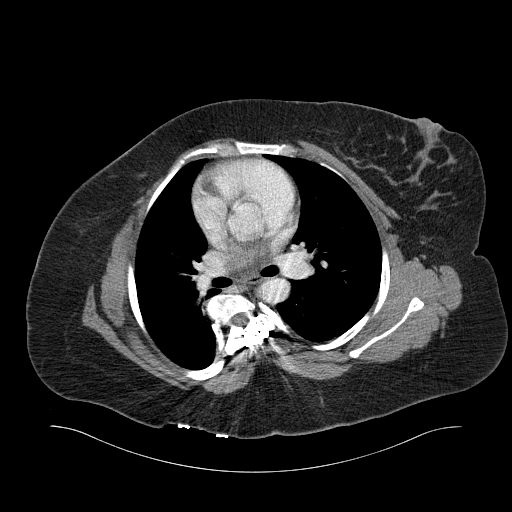
[im 134/141  lung]
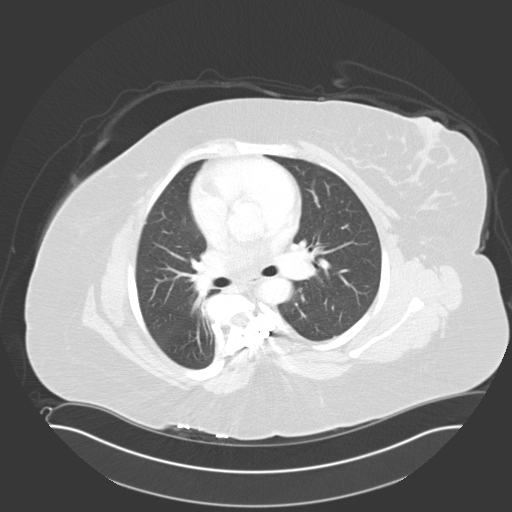

[15 of 32 positions shown; findings below may reference images not displayed]

PROCEDURE:     CT  - CT ABDOMEN / PELVIS  W  - [DATE] [DATE]

RESULT:     CT of the abdomen and pelvis is performed with 100 mL of
[B6] iodinated intravenous contrast. Images are reconstructed at 3 mm
slice thickness.

The lung bases appear clear. Thoracolumbar scoliosis is present concave to
the left with postoperative changes present. There is a ventral hernia with
some increased attenuation suggestive of edema in the periumbilical region.
The urinary bladder, uterus and adnexal structures appear unremarkable.
There is no ascites or abscess. The appendix appears normal. No abnormal
bowel distention is present. There is a small low-attenuation area in the
dome of the right hepatic lobe which is too small for accurate
characterization. The kidneys show no obstruction or discrete mass. The
spleen is unremarkable. The pancreas is within normal limits. There is a
stone near the fundus of the gallbladder in the dependent region. The aorta
appears normal. There is no adenopathy.
IMPRESSION: 1. Fat-filled ventral hernia possibly with some edema.
2. Low-attenuation area in the dome of the right hepatic lobe which is of
uncertain significance.
3. Cholelithiasis.
3. Scoliosis with spinal rod present.

## 2010-03-26 ENCOUNTER — Ambulatory Visit: Payer: Self-pay | Admitting: Surgery

## 2010-07-08 ENCOUNTER — Emergency Department: Payer: Self-pay | Admitting: Emergency Medicine

## 2010-07-08 IMAGING — CR DG ABDOMEN 1V
1 series · 2 of 2 positions shown · non-contrast
Comparison: none

REASON FOR EXAM: constipation; pt in [facility]
COMMENTS:   LMP: One week ago

[Series 1: view not recorded · 0.17mm/px · 2 of 2 slices shown]
[im 1/2]
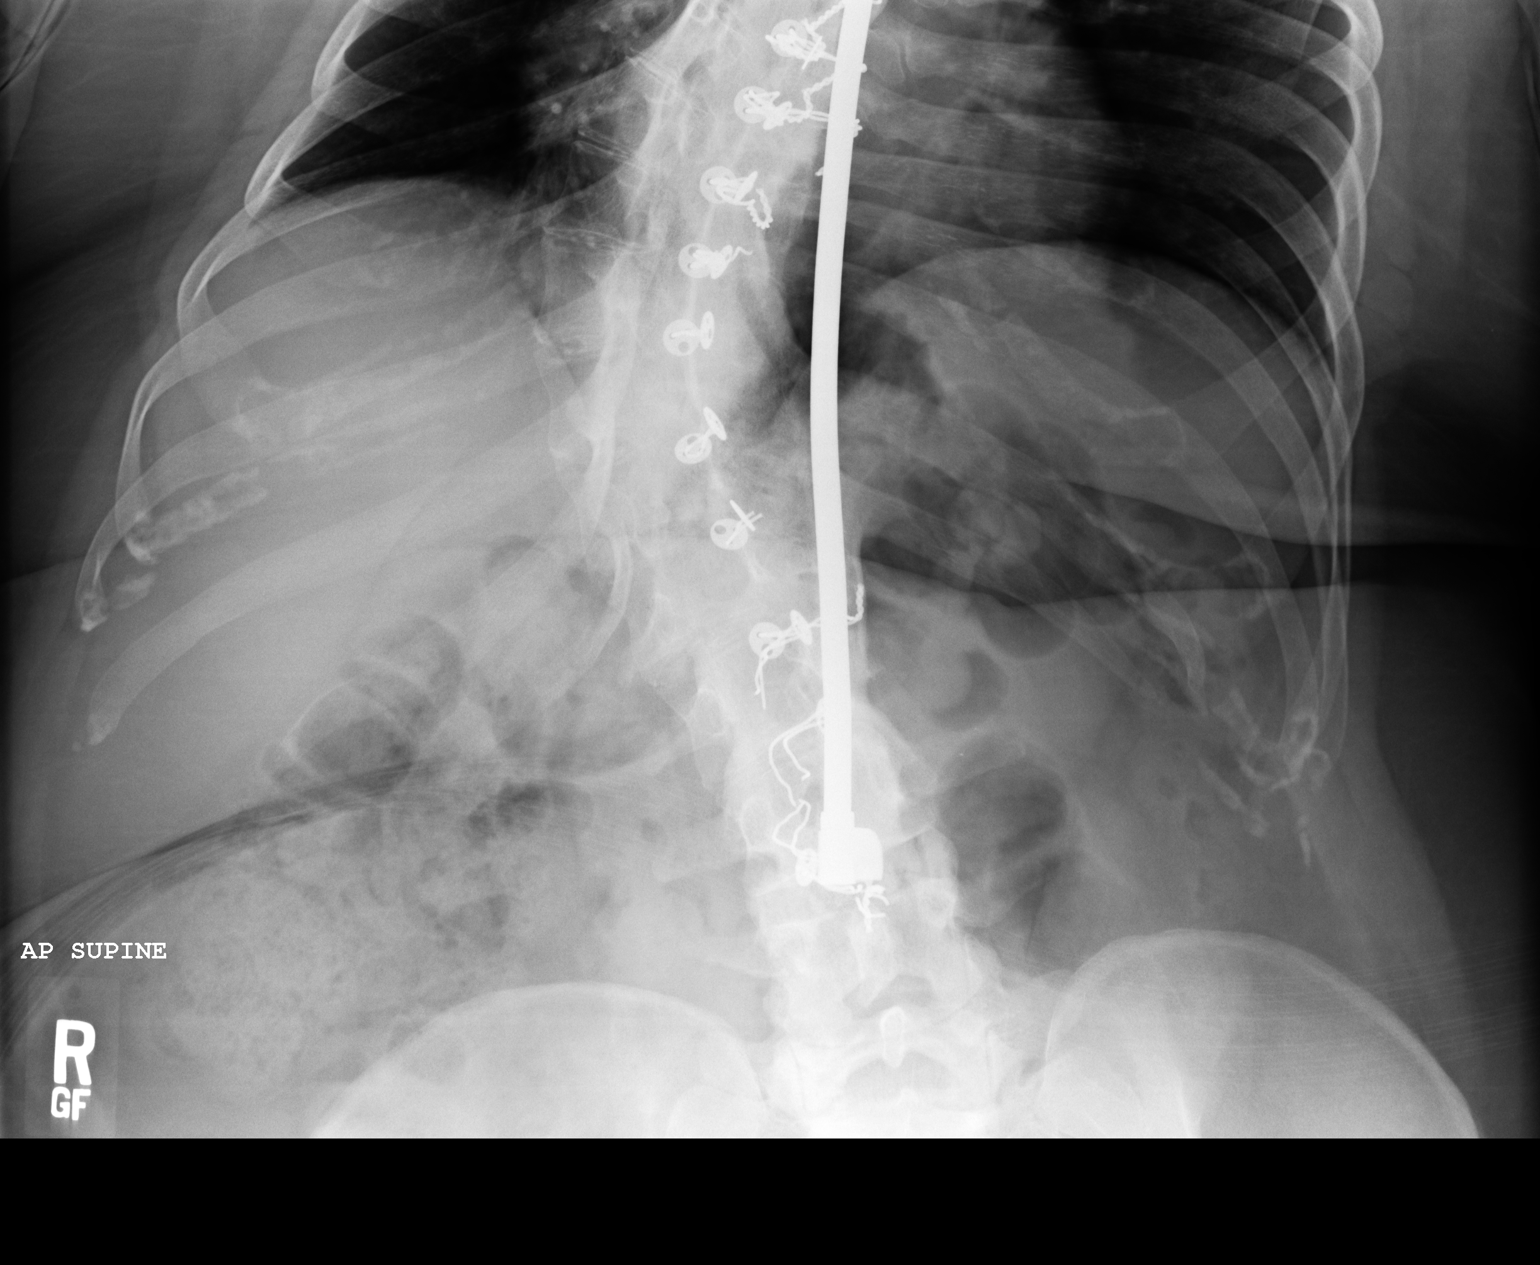
[im 2/2]
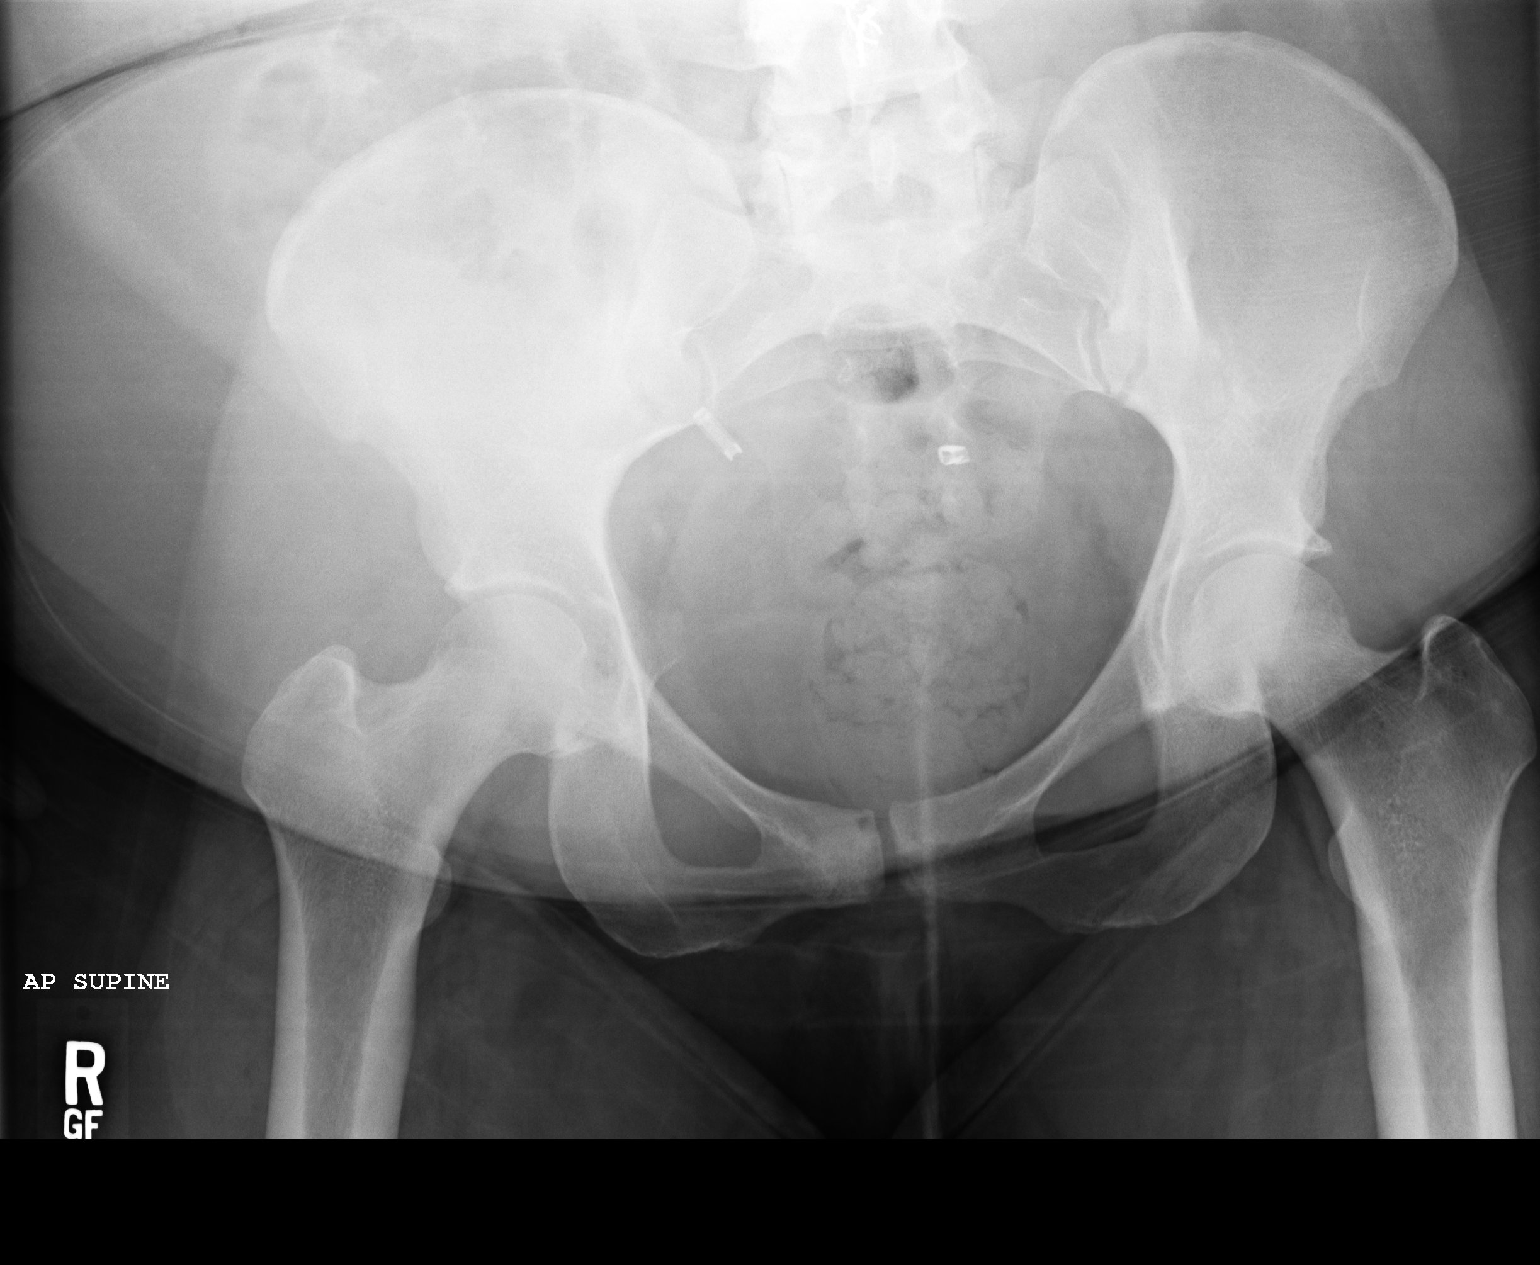

[2 of 2 positions shown; findings below may reference images not displayed]

PROCEDURE:     DXR - DXR KIDNEY URETER BLADDER  - [DATE]  [DATE]

RESULT:     Air is seen within nondilated loops of large and small bowel. A
moderate to large amount of stool is appreciated. The patient is status post
spinal fixation. Hardware appears intact. There is marked dextroscoliosis of
the thoracolumbar spine.
IMPRESSION: Nonobstructive bowel gas pattern with a moderate to large amount of stool.

## 2010-11-14 ENCOUNTER — Other Ambulatory Visit: Payer: Self-pay | Admitting: Internal Medicine

## 2010-11-14 IMAGING — CR DG CHEST 2V
1 series · 3 of 3 positions shown · non-contrast
Comparison: none

REASON FOR EXAM: shortness of breath
COMMENTS:

[Series 1: w chest pa · 0.14mm/px · 3 of 3 slices shown]
[im 1/3]
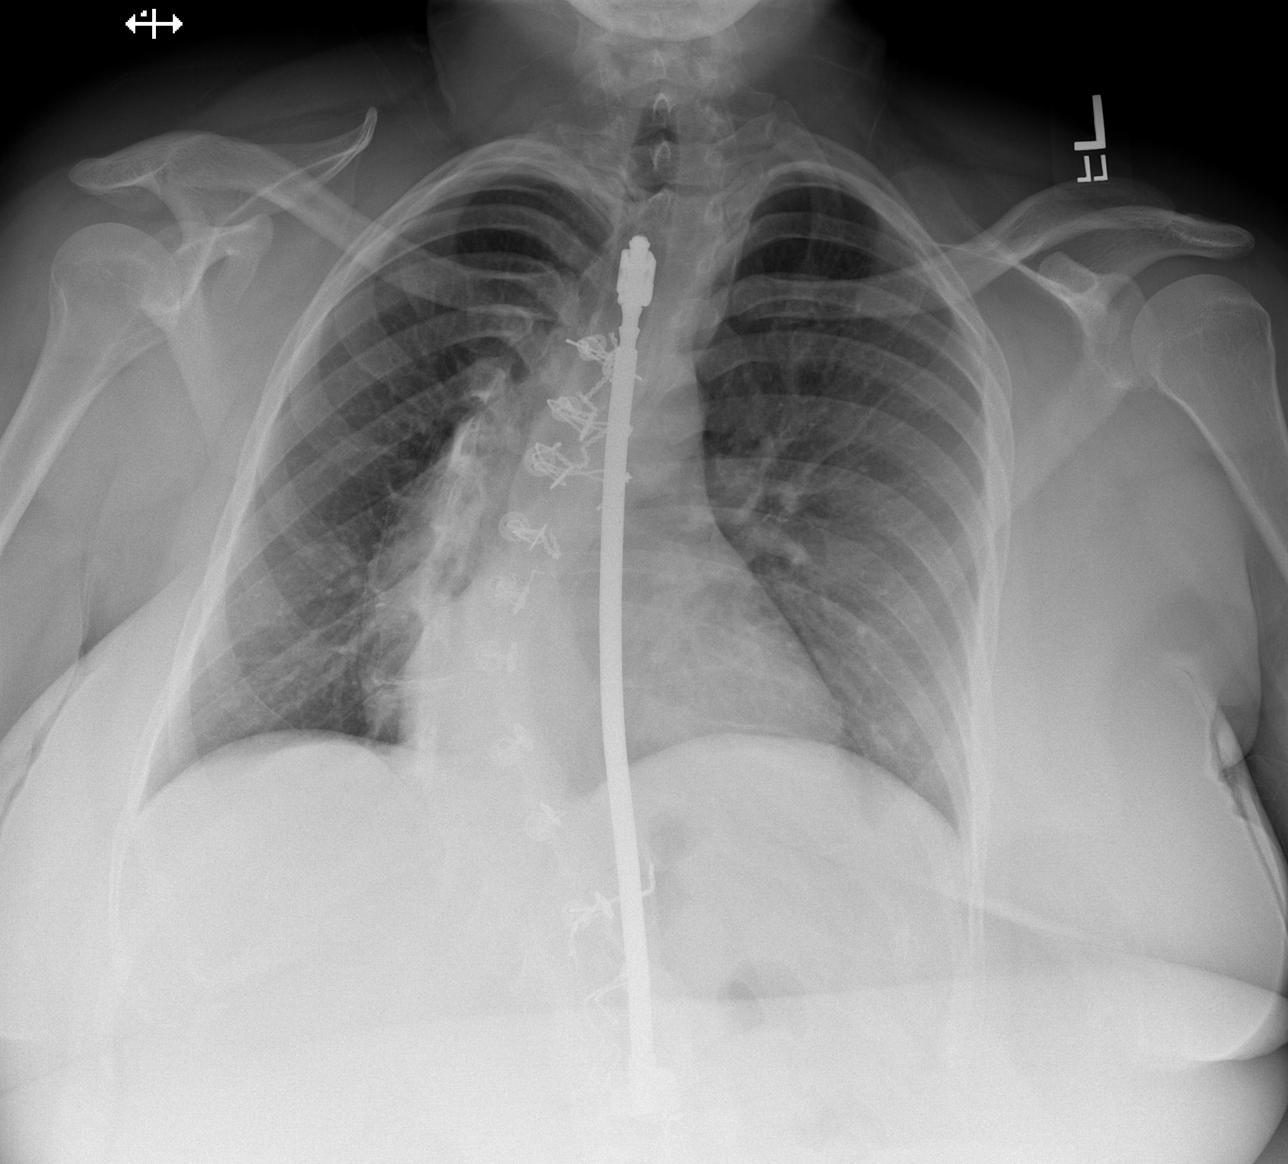
[im 2/3]
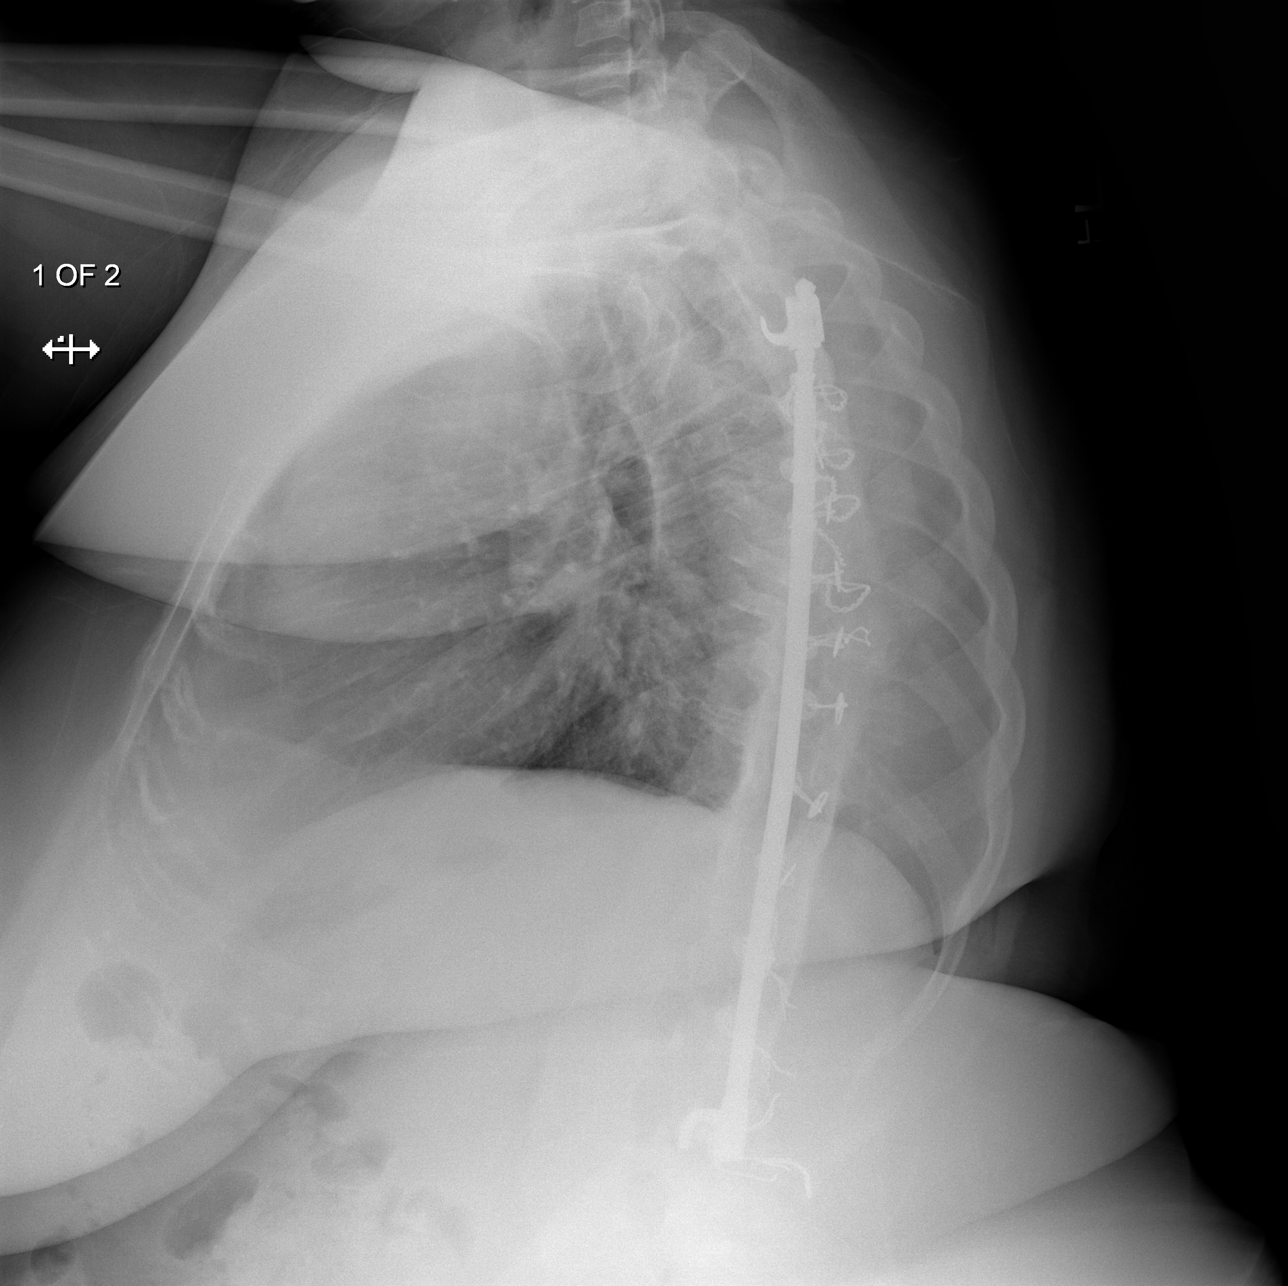
[im 3/3]
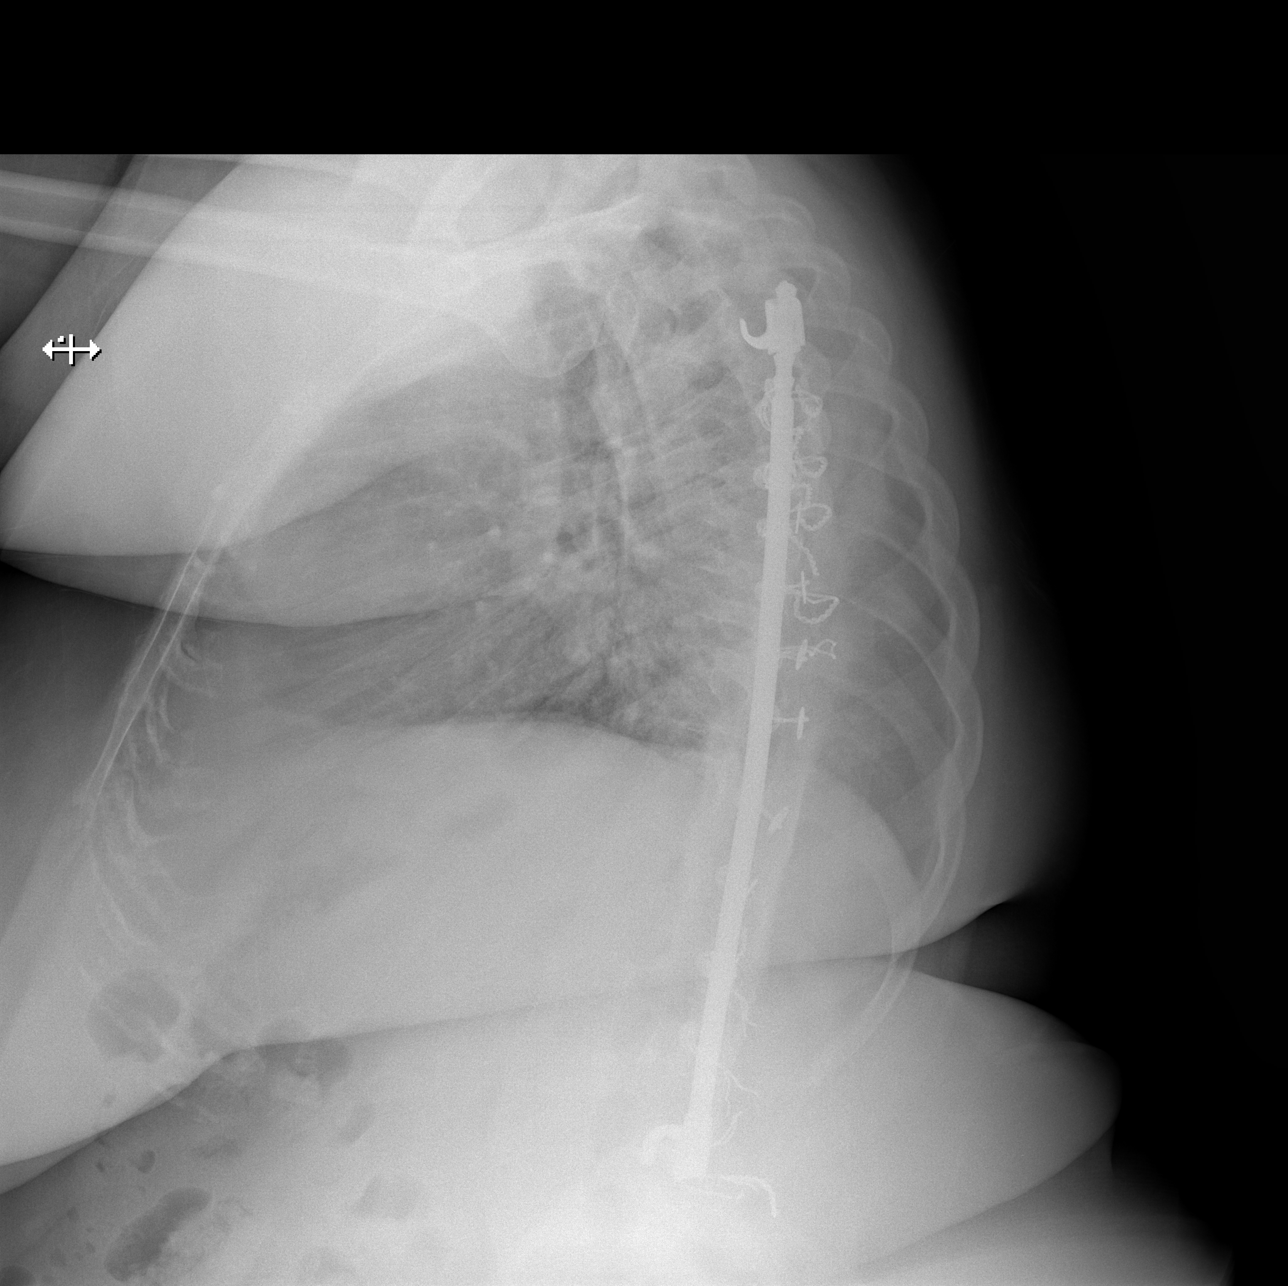

[3 of 3 positions shown; findings below may reference images not displayed]

PROCEDURE:     DXR - DXR CHEST PA (OR AP) AND LATERAL  - [DATE]  [DATE]

RESULT:     The lung fields are clear. No pneumonia, pneumothorax or pleural
effusion is seen. Heart size is normal. There is observed a thoracic
scoliosis that has been stabilized by a Harrington rod. No acute bony
abnormalities are seen.
IMPRESSION: 1. The lung fields are clear.
2. Heart size is normal.
3. There is a moderate thoracic scoliosis that has been stabilized by a
Harrington rod.

## 2013-08-30 IMAGING — US US EXTREM LOW VENOUS*R*
1 series · 13 of 24 positions shown · non-contrast
Comparison: None.

CLINICAL DATA: Right lower extremity swelling 1 month with lower
extremity pain 3 days.



[Series 1: us extrem low venous*right* · 0.09mm/px · 13 of 31 slices shown]
[im 1/31]
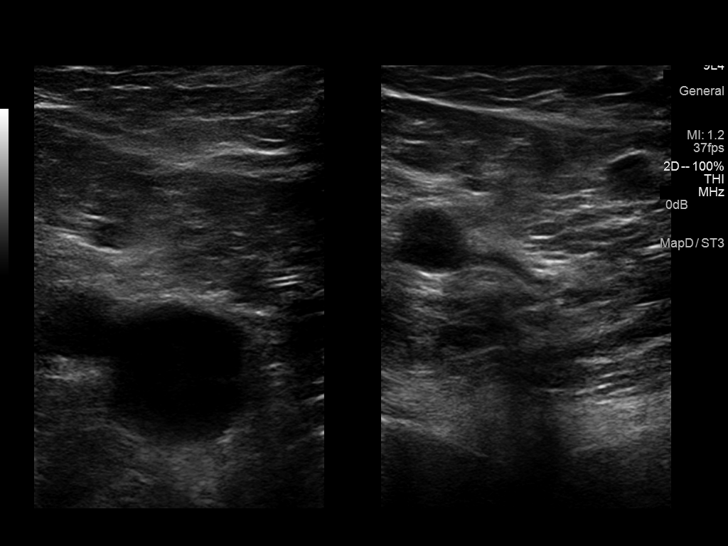
[im 3/31]
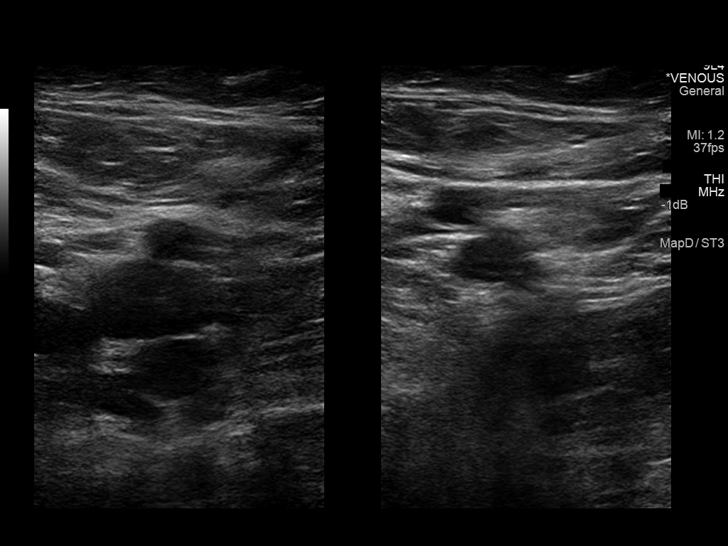
[im 6/31]
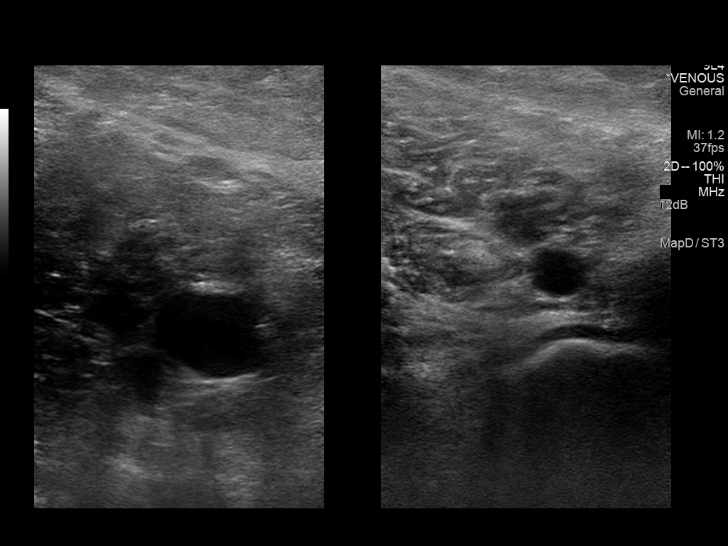
[im 8/31]
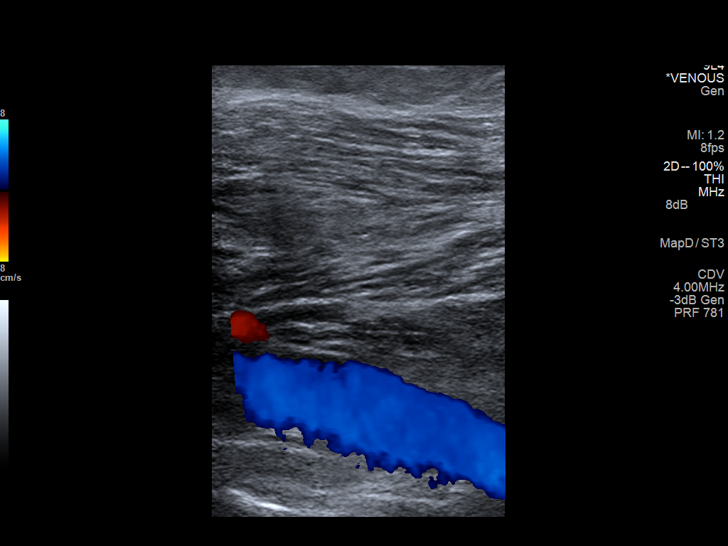
[im 11/31]
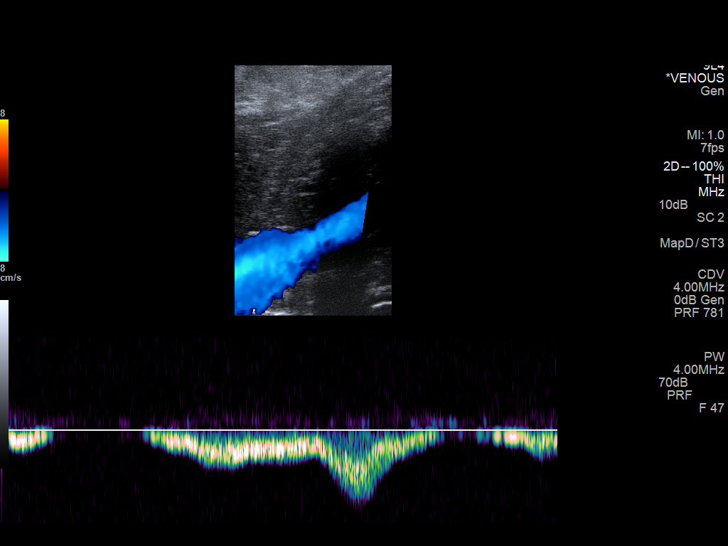
[im 14/31]
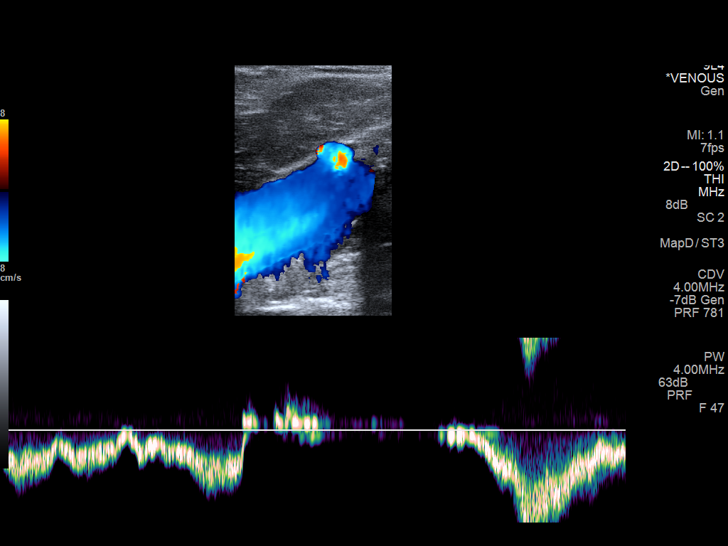
[im 16/31]
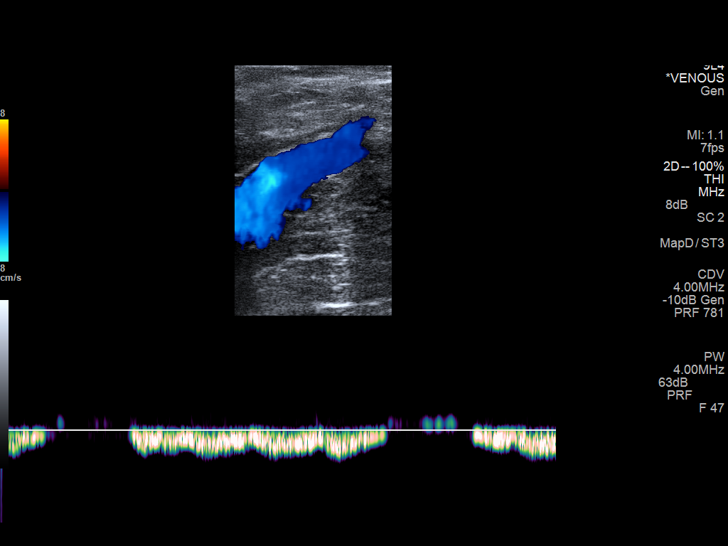
[im 17/31]
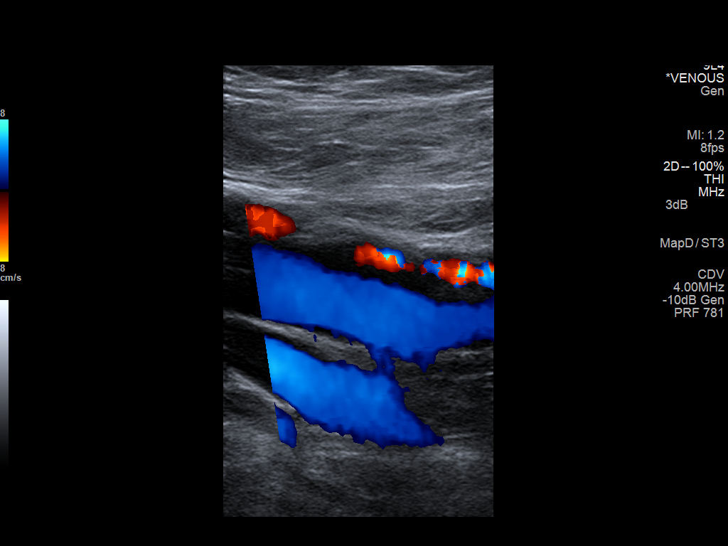
[im 20/31]
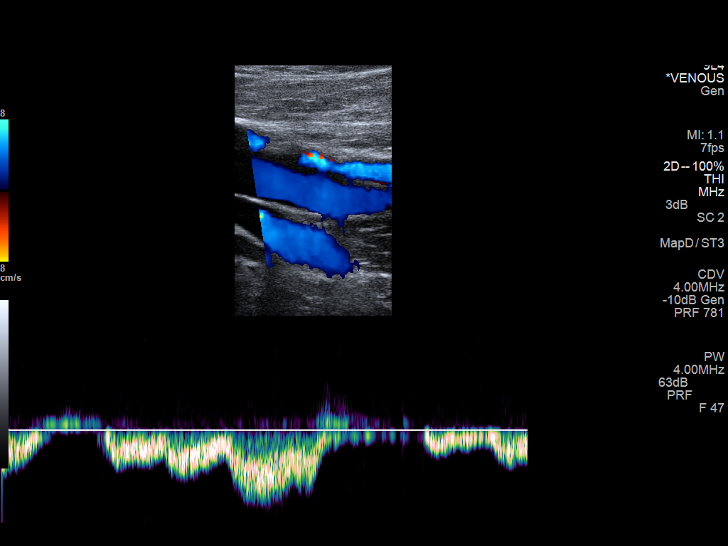
[im 23/31]
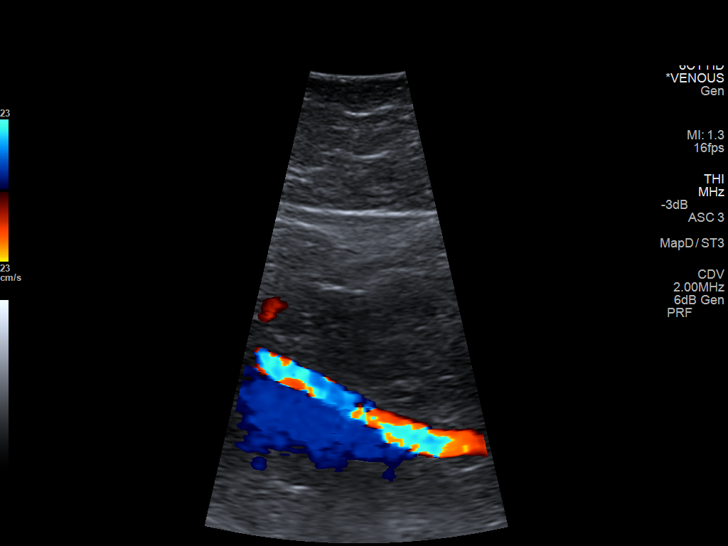
[im 25/31]
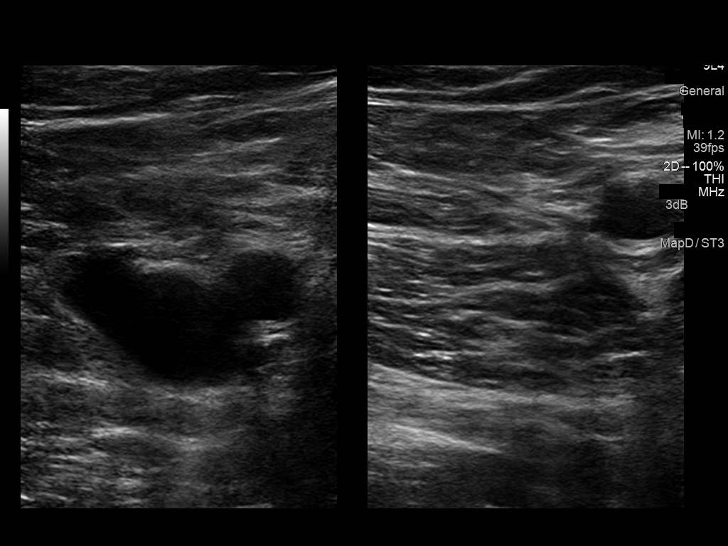
[im 28/31]
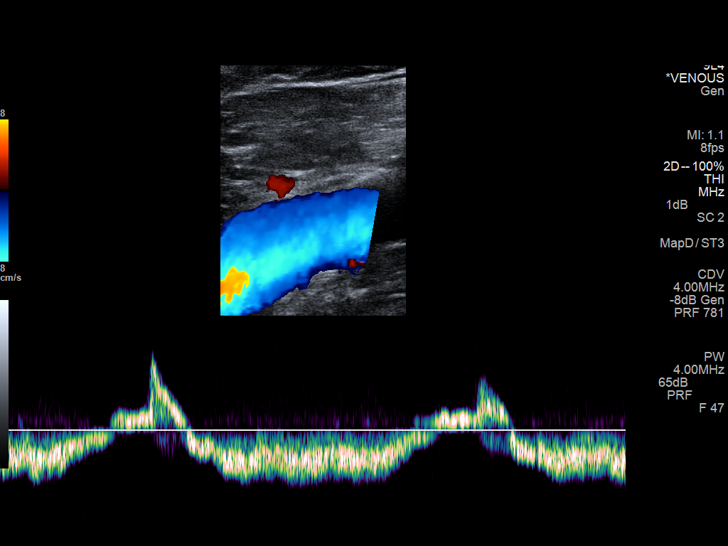
[im 31/31]
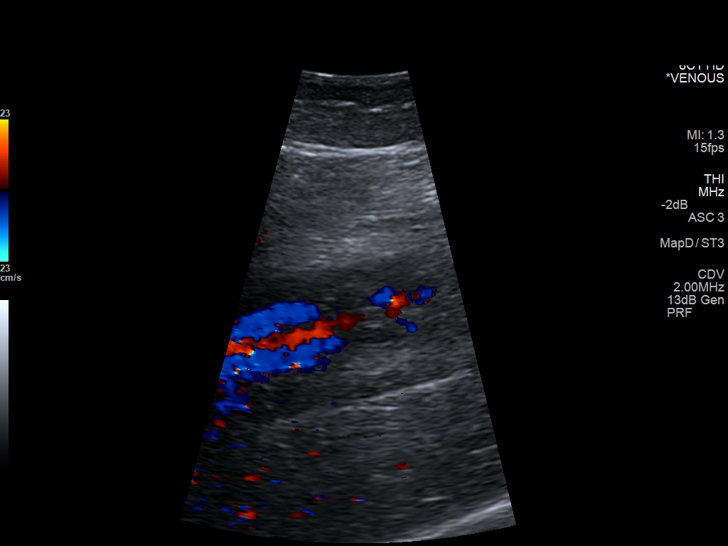

[13 of 24 positions shown; findings below may reference images not displayed]

FINDINGS: Contralateral Common Femoral Vein: Respiratory phasicity is normal
and symmetric with the symptomatic side. No evidence of thrombus.
Normal compressibility.

Common Femoral Vein: No evidence of thrombus. Normal
compressibility, respiratory phasicity and response to augmentation.

Saphenofemoral Junction: No evidence of thrombus. Normal
compressibility and flow on color Doppler imaging.

Profunda Femoral Vein: No evidence of thrombus. Normal
compressibility and flow on color Doppler imaging.

Femoral Vein: No evidence of thrombus. Normal compressibility,
respiratory phasicity and response to augmentation.

Popliteal Vein: No evidence of thrombus. Normal compressibility,
respiratory phasicity and response to augmentation.

Calf Veins: Posterior tibial veins within normal. Peroneal veins not
visualized.

Superficial Great Saphenous Vein: No evidence of thrombus. Normal
compressibility and flow on color Doppler imaging.

Venous Reflux:  None.

Other Findings:  None.
IMPRESSION: No evidence of deep venous thrombosis.

## 2013-12-18 ENCOUNTER — Emergency Department: Payer: Self-pay | Admitting: Emergency Medicine

## 2013-12-18 LAB — CBC
HCT: 37.5 % (ref 35.0–47.0)
HGB: 11.7 g/dL — AB (ref 12.0–16.0)
MCH: 25.4 pg — AB (ref 26.0–34.0)
MCHC: 31.1 g/dL — AB (ref 32.0–36.0)
MCV: 82 fL (ref 80–100)
PLATELETS: 261 10*3/uL (ref 150–440)
RBC: 4.58 10*6/uL (ref 3.80–5.20)
RDW: 14.8 % — ABNORMAL HIGH (ref 11.5–14.5)
WBC: 11.3 10*3/uL — ABNORMAL HIGH (ref 3.6–11.0)

## 2013-12-18 LAB — BASIC METABOLIC PANEL
Anion Gap: 9 (ref 7–16)
BUN: 10 mg/dL (ref 7–18)
CALCIUM: 7.8 mg/dL — AB (ref 8.5–10.1)
CO2: 28 mmol/L (ref 21–32)
Chloride: 105 mmol/L (ref 98–107)
Creatinine: 0.84 mg/dL (ref 0.60–1.30)
EGFR (Non-African Amer.): >60
Glucose: 97 mg/dL (ref 65–99)
Osmolality: 282 (ref 275–301)
Potassium: 3.8 mmol/L (ref 3.5–5.1)
SODIUM: 142 mmol/L (ref 136–145)

## 2013-12-18 LAB — TROPONIN I: Troponin-I: <0.02 ng/mL

## 2013-12-18 IMAGING — CR DG CHEST 2V
1 series · 3 of 3 positions shown · non-contrast
Comparison: Chest radiographs [DATE].

CLINICAL DATA: Generalized chest pain with shortness of breath and
cough for 2 days. Initial encounter.

EXAM:
CHEST  2 VIEW

[Series 1: dxr chest pa (or ap) and lateral · 0.14mm/px · 3 of 3 slices shown]
[im 1/3]
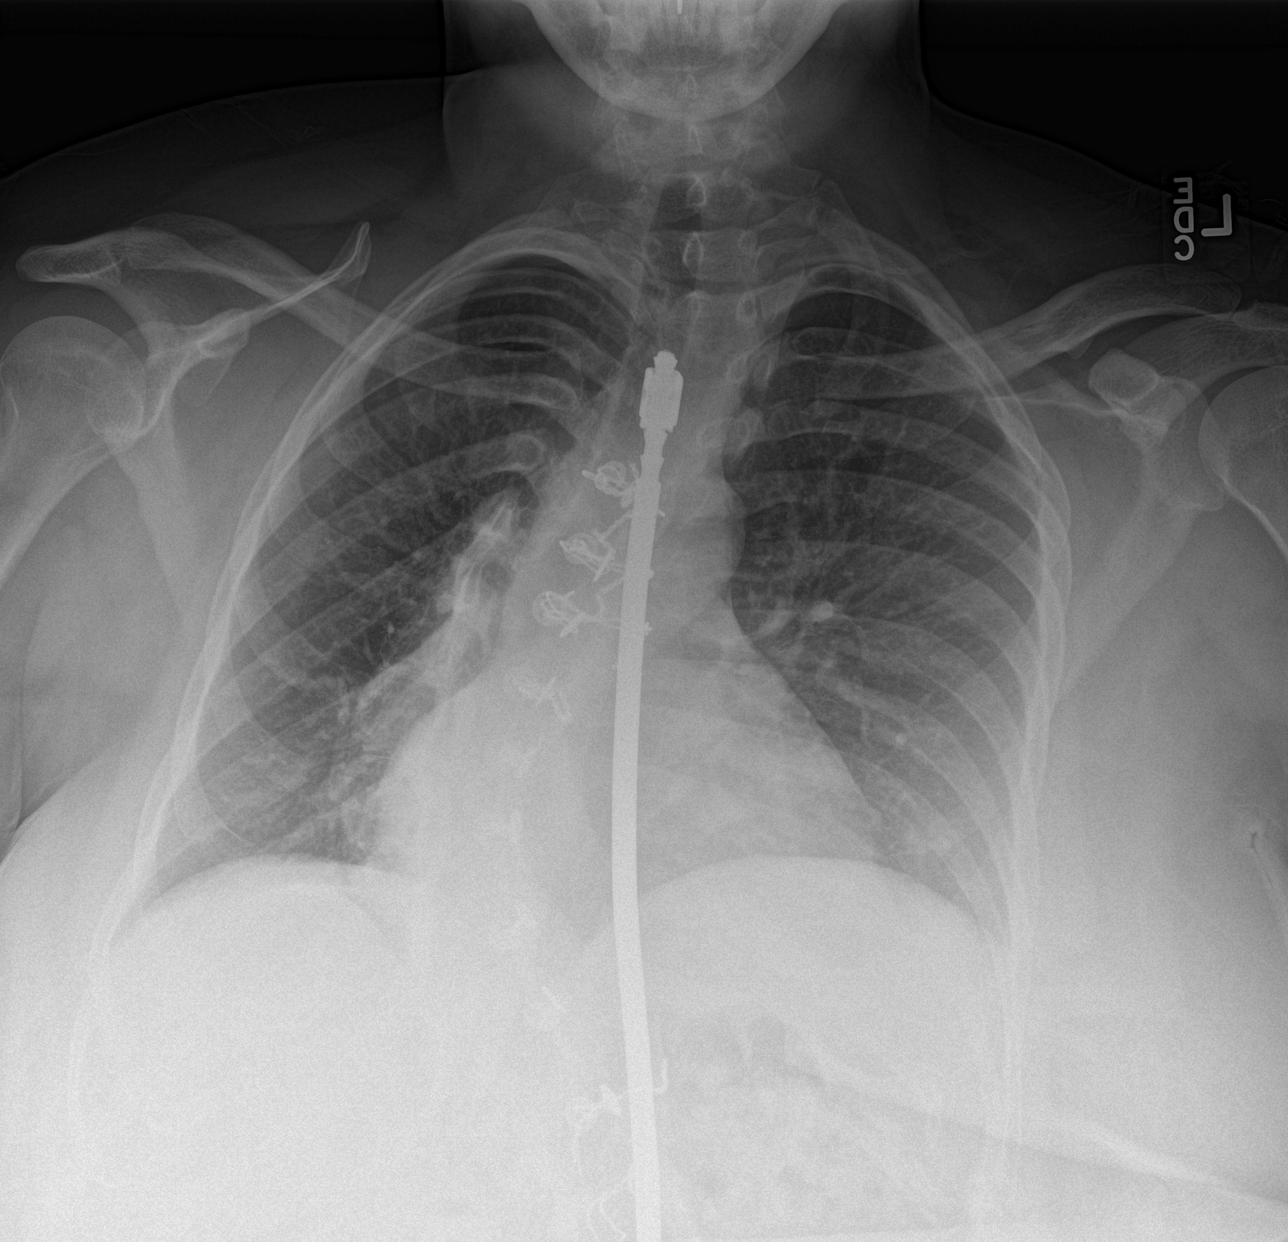
[im 2/3]
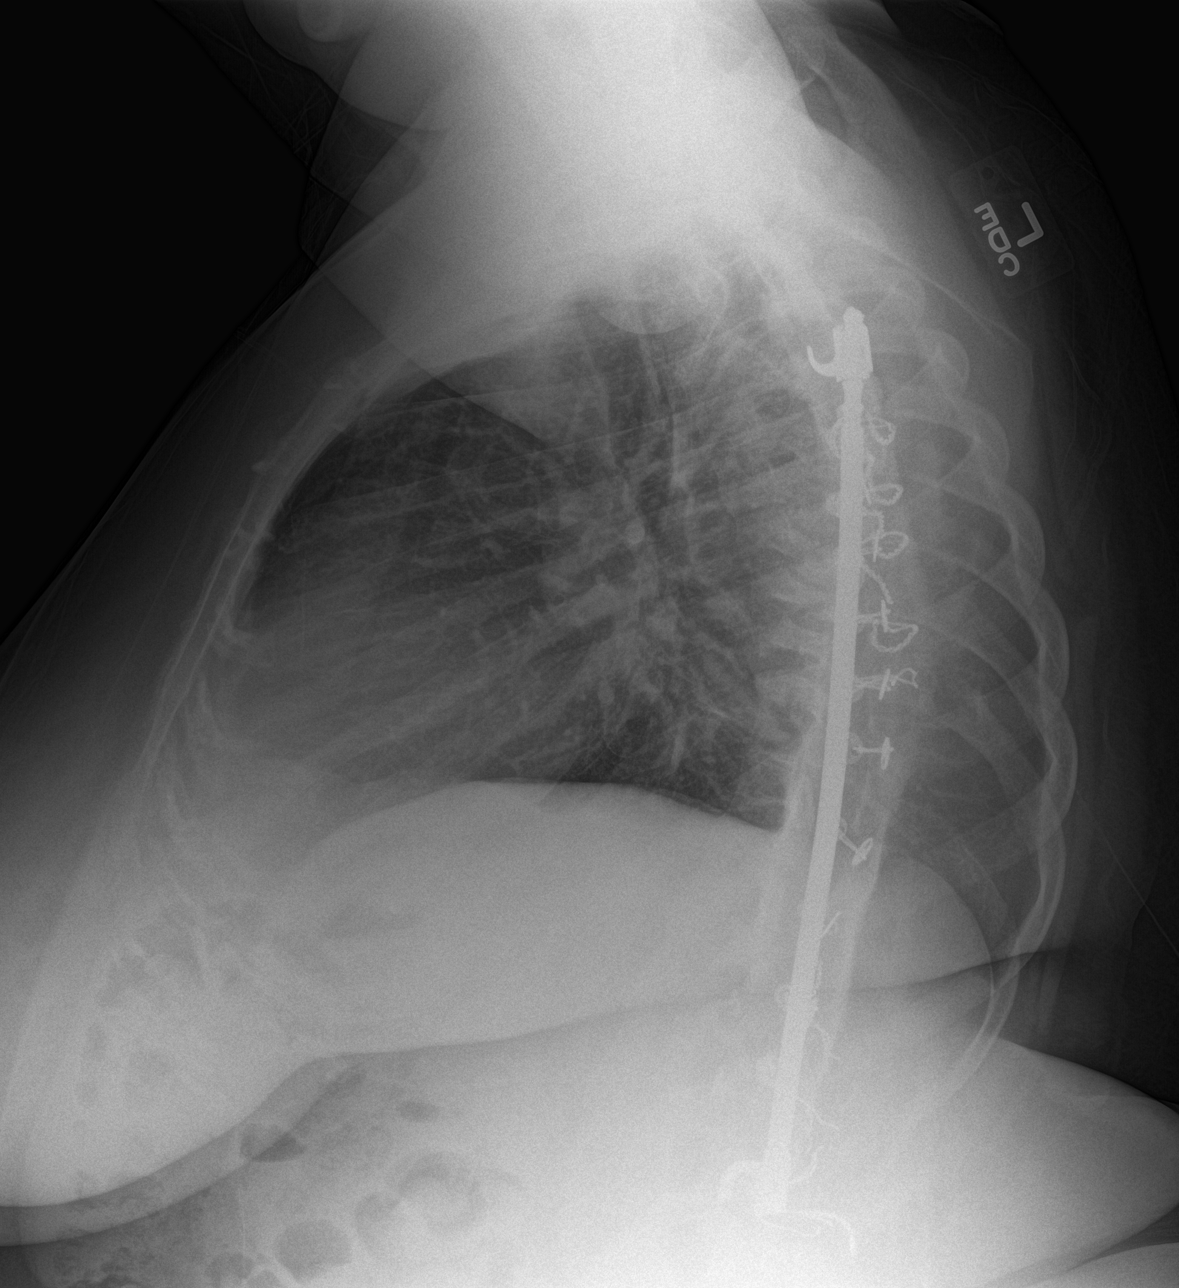
[im 3/3]
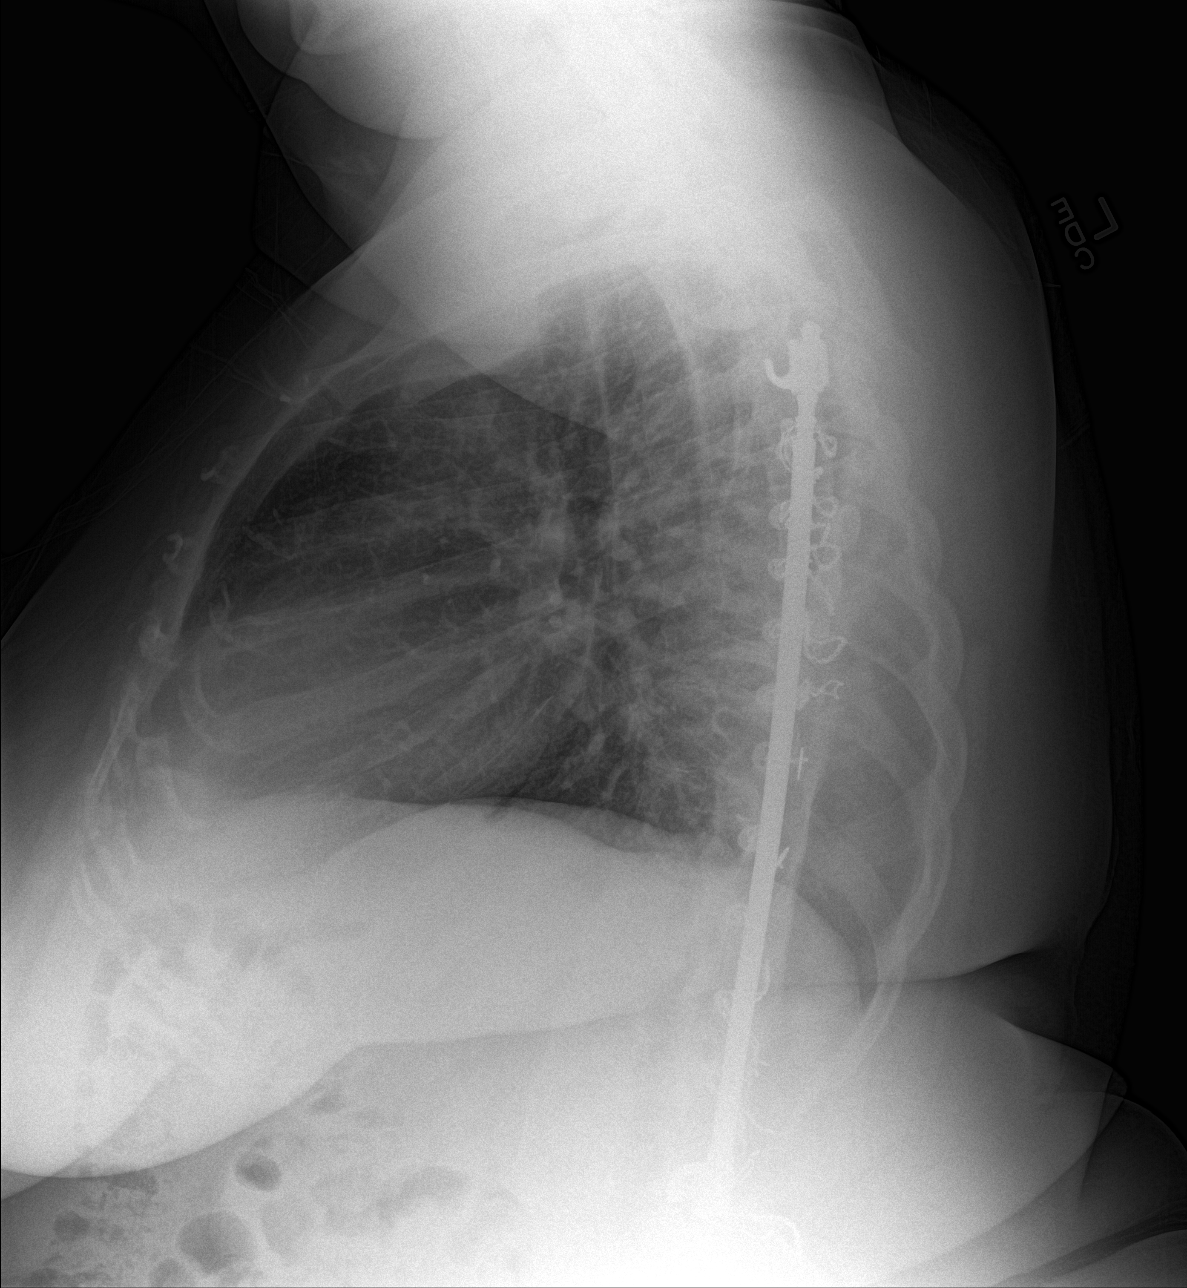

[3 of 3 positions shown; findings below may reference images not displayed]

FINDINGS: The heart size and mediastinal contours are stable. The lungs are
clear. There is no pleural effusion or pneumothorax. Thoracolumbar
Harrington rods and moderate convex right scoliosis appear
unchanged. No acute osseous findings demonstrated.
IMPRESSION: Stable examination.  No acute cardiopulmonary process identified.

## 2014-01-19 ENCOUNTER — Emergency Department: Payer: Self-pay | Admitting: Emergency Medicine
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
## 2014-01-25 ENCOUNTER — Emergency Department: Payer: Self-pay | Admitting: Internal Medicine

## 2014-06-28 ENCOUNTER — Other Ambulatory Visit: Payer: Self-pay

## 2014-06-28 ENCOUNTER — Emergency Department: Payer: BLUE CROSS/BLUE SHIELD

## 2014-06-28 ENCOUNTER — Emergency Department
Admission: EM | Admit: 2014-06-28 | Discharge: 2014-06-28 | Disposition: A | Discharge status: Home / Self Care | Payer: BLUE CROSS/BLUE SHIELD | Attending: Emergency Medicine | Admitting: Emergency Medicine

## 2014-06-28 DIAGNOSIS — Z720 Tobacco use: Secondary | ICD-10-CM | POA: Insufficient documentation

## 2014-06-28 DIAGNOSIS — R0789 Other chest pain: Secondary | ICD-10-CM | POA: Diagnosis not present

## 2014-06-28 DIAGNOSIS — R0602 Shortness of breath: Secondary | ICD-10-CM | POA: Diagnosis not present

## 2014-06-28 DIAGNOSIS — Z79899 Other long term (current) drug therapy: Secondary | ICD-10-CM | POA: Insufficient documentation

## 2014-06-28 DIAGNOSIS — R079 Chest pain, unspecified: Secondary | ICD-10-CM

## 2014-06-28 HISTORY — DX: Scoliosis, unspecified: M41.9

## 2014-06-28 LAB — CBC
HEMATOCRIT: 36.9 % (ref 35.0–47.0)
HEMOGLOBIN: 11.5 g/dL — AB (ref 12.0–16.0)
MCH: 24.3 pg — ABNORMAL LOW (ref 26.0–34.0)
MCHC: 31.2 g/dL — AB (ref 32.0–36.0)
MCV: 77.9 fL — ABNORMAL LOW (ref 80.0–100.0)
Platelets: 245 10*3/uL (ref 150–440)
RBC: 4.74 MIL/uL (ref 3.80–5.20)
RDW: 14.5 % (ref 11.5–14.5)
WBC: 8.3 10*3/uL (ref 3.6–11.0)

## 2014-06-28 LAB — TROPONIN I: Troponin I: <0.03 ng/mL (ref <0.031)

## 2014-06-28 LAB — BASIC METABOLIC PANEL
ANION GAP: 11 (ref 5–15)
BUN: 15 mg/dL (ref 6–20)
CALCIUM: 9.3 mg/dL (ref 8.9–10.3)
CO2: 26 mmol/L (ref 22–32)
CREATININE: 0.9 mg/dL (ref 0.44–1.00)
Chloride: 102 mmol/L (ref 101–111)
GFR calc Af Amer: >60 mL/min (ref >60)
GFR calc non Af Amer: >60 mL/min (ref >60)
GLUCOSE: 109 mg/dL — AB (ref 65–99)
Potassium: 3.8 mmol/L (ref 3.5–5.1)
Sodium: 139 mmol/L (ref 135–145)

## 2014-06-28 IMAGING — DX DG CHEST 2V
2 series · 2 of 2 positions shown · non-contrast
Comparison: [DATE]

CLINICAL DATA: Left-sided chest pain

EXAM:
CHEST  2 VIEW

[chest pa]
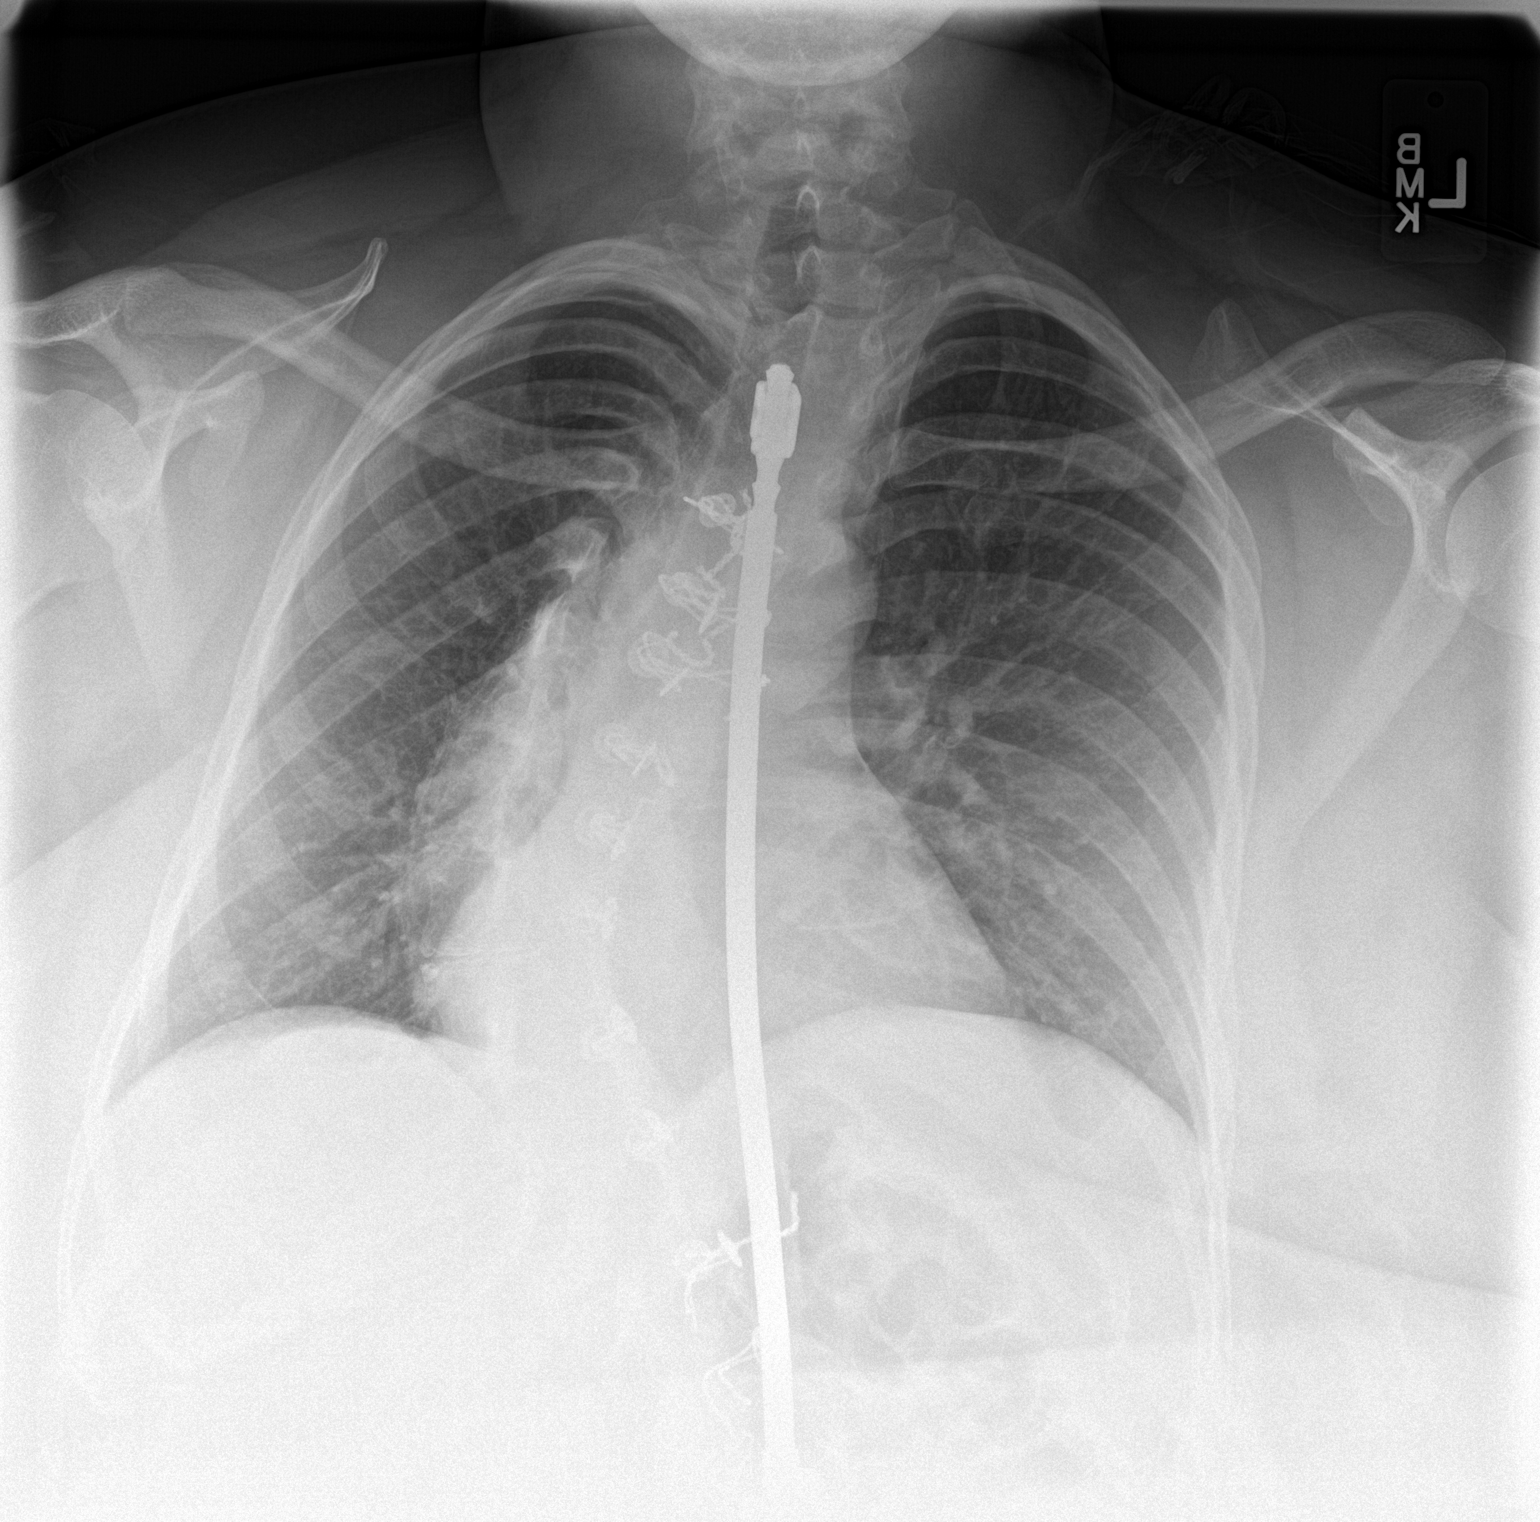

[chest lat]
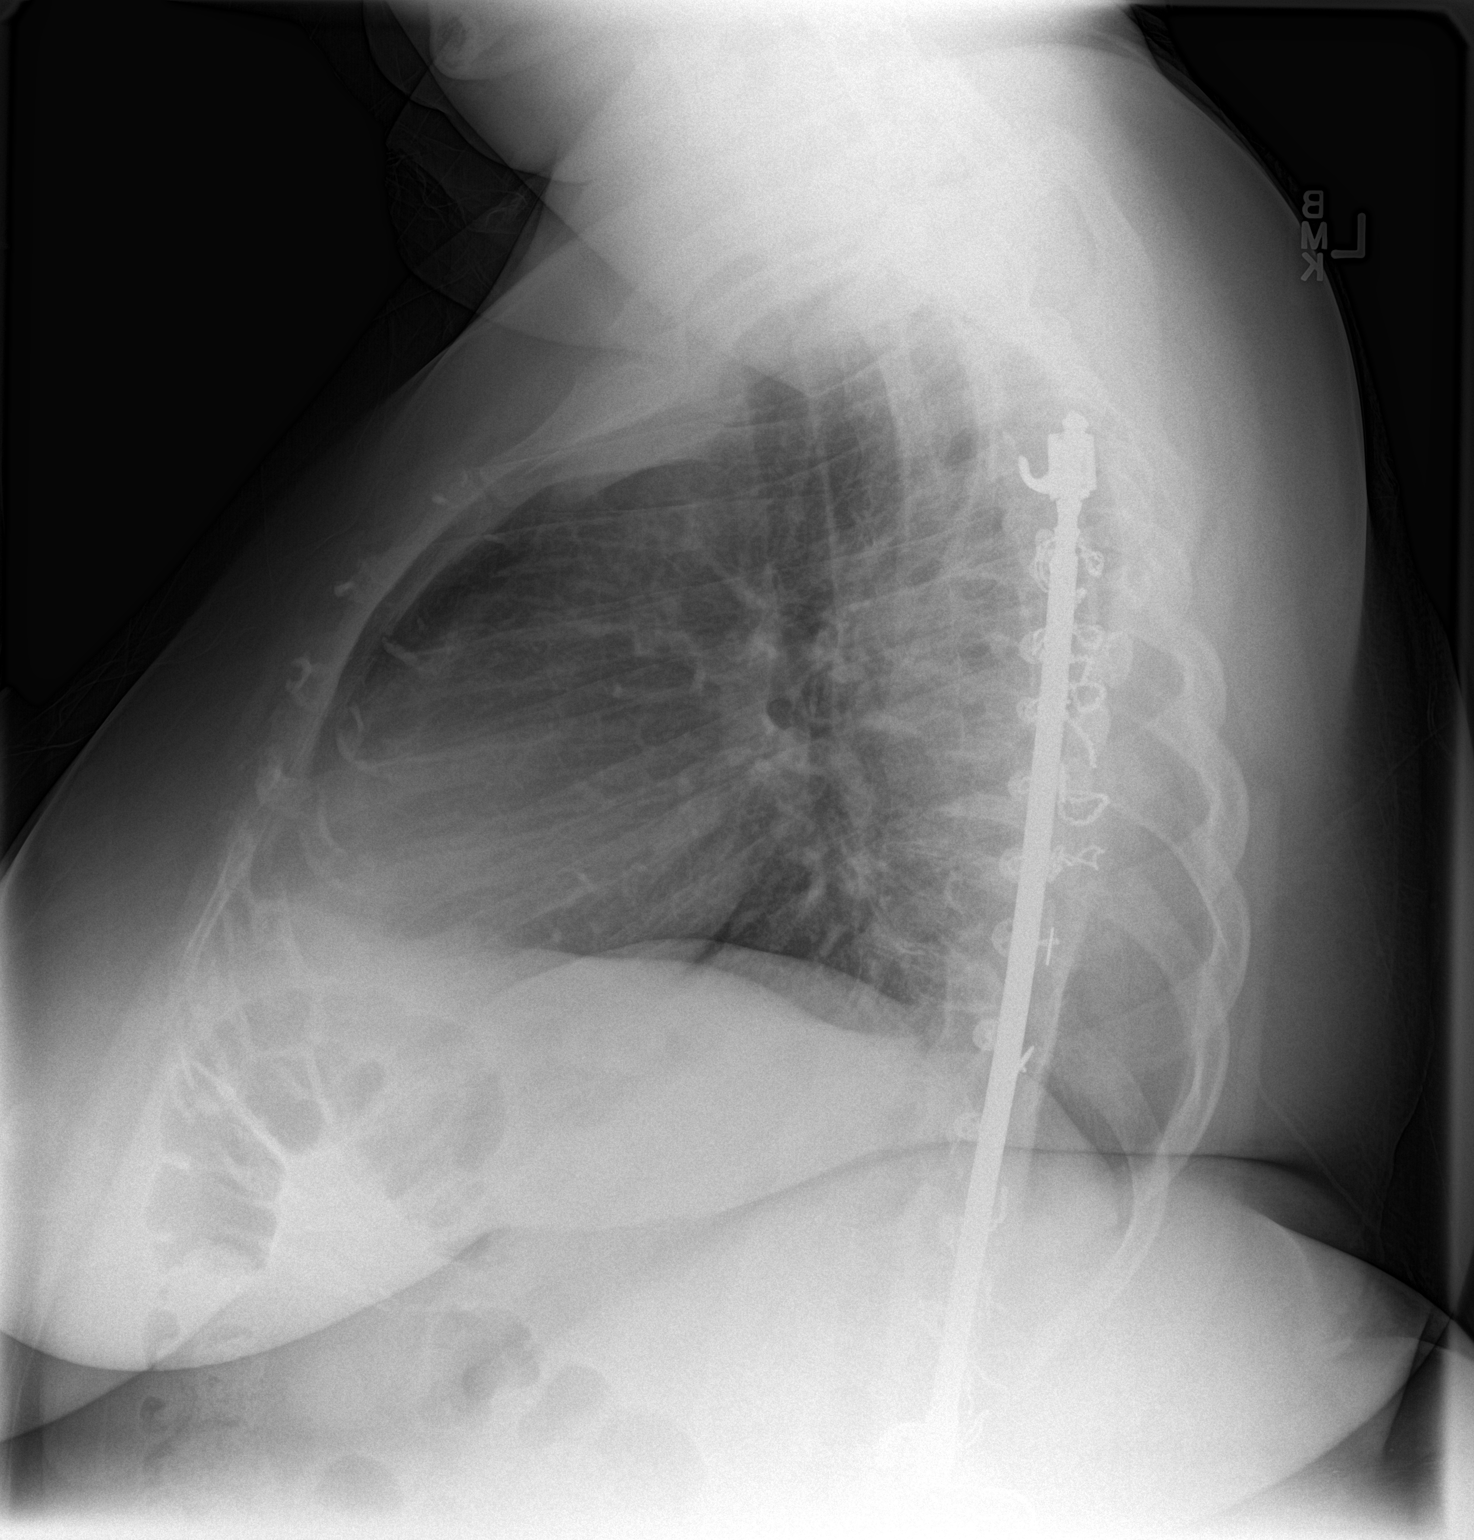

[2 of 2 positions shown; findings below may reference images not displayed]

FINDINGS: Cardiac shadow is at the upper limits of normal in size. Scoliosis
with previous surgical changes is seen. The lungs are clear
bilaterally. No sizable effusion is noted. No acute bony abnormality
is noted.
IMPRESSION: Chronic changes without acute abnormality.

## 2014-06-28 MED ORDER — IBUPROFEN 800 MG PO TABS
ORAL_TABLET | ORAL | Status: AC
  Administered 2014-06-28: 800 mg via ORAL
  Filled 2014-06-28: qty 1

## 2014-06-28 MED ORDER — IBUPROFEN 800 MG PO TABS
800.0000 mg | ORAL_TABLET | Freq: Once | ORAL | Status: AC
  Administered 2014-06-28: 800 mg via ORAL

## 2014-06-28 NOTE — ED Provider Notes (Signed)
[facility] Emergency Department Provider Note   ____________________________________________  Time seen: 2218  I have reviewed the triage vital signs and the nursing notes.   HISTORY  Chief Complaint Chest Pain and Shortness of Breath   History limited by: Not Limited   HPI Tracey White is a 34 y.o. female who presents to the emergency department because of left-sided chest pain. She has been having symptoms on and off for the past week. She states for the past 2 days and is been worse. It is located in the left chest and radiates to her left shoulder and left neck. Etc. these have to do with anxiety and has been working with her primary care doctor to get her anxiety and stress under control. Patient denies any shortness of breath. Denies any fevers.   Patient denied any family history of heart disease.     Past Medical History  Diagnosis Date  . Scoliosis     There are no active problems to display for this patient.   Past Surgical History  Procedure Laterality Date  . Tubal ligation    . Hernia repair    . Scoliosis repair      Current Outpatient Rx  Name  Route  Sig  Dispense  Refill  . ferrous fumarate (HEMOCYTE - 106 MG FE) 325 (106 FE) MG TABS tablet   Oral   Take 1 tablet by mouth 2 (two) times daily.         . sertraline (ZOLOFT) 100 MG tablet   Oral   Take 100 mg by mouth daily.           Allergies Aspirin  No family history on file.  Social History History  Substance Use Topics  . Smoking status: Current Some Day Smoker -- 0.50 packs/day    Types: Cigarettes  . Smokeless tobacco: Never Used  . Alcohol Use: No    Review of Systems  Constitutional: Negative for fever. Cardiovascular: Positive for chest pain. Respiratory: Negative for shortness of breath. Gastrointestinal: Negative for abdominal pain, vomiting and diarrhea. Genitourinary: Negative for dysuria. Musculoskeletal: Negative for back  pain. Skin: Negative for rash. Neurological: Negative for headaches, focal weakness or numbness.   10-point ROS otherwise negative.  ____________________________________________   PHYSICAL EXAM:  VITAL SIGNS: ED Triage Vitals  Enc Vitals Group     BP 06/28/14 1638 124/79 mmHg     Pulse Rate 06/28/14 1638 79     Resp 06/28/14 1638 18     Temp 06/28/14 1638 98.1 F (36.7 C)     Temp Source 06/28/14 1638 Oral     SpO2 06/28/14 1638 99 %     Weight 06/28/14 1638 283 lb (128.368 kg)     Height 06/28/14 1638 5\' 1"  (1.549 m)     Head Cir --      Peak Flow --      Pain Score 06/28/14 1639 8     Pain Loc --      Pain Edu? --      Excl. in GC? --      Constitutional: Alert and oriented. Well appearing and in no distress. Eyes: Conjunctivae are normal. PERRL. Normal extraocular movements. ENT   Head: Normocephalic and atraumatic.   Nose: No congestion/rhinnorhea.   Mouth/Throat: Mucous membranes are moist.   Neck: No stridor. Hematological/Lymphatic/Immunilogical: No cervical lymphadenopathy. Cardiovascular: Normal rate, regular rhythm.  No murmurs, rubs, or gallops. Mildly tender to palpation of the left upper chest. Respiratory: Normal  respiratory effort without tachypnea nor retractions. Breath sounds are clear and equal bilaterally. No wheezes/rales/rhonchi. Gastrointestinal: Soft and nontender. No distention.  Genitourinary: Deferred Musculoskeletal: Normal range of motion in all extremities. No joint effusions.  No lower extremity tenderness nor edema. Neurologic:  Normal speech and language. No gross focal neurologic deficits are appreciated. Speech is normal.  Skin:  Skin is warm, dry and intact. No rash noted. Psychiatric: Mood and affect are normal. Speech and behavior are normal. Patient exhibits appropriate insight and judgment.  ____________________________________________    LABS (pertinent positives/negatives)  Labs Reviewed  CBC - Abnormal;  Notable for the following:    Hemoglobin 11.5 (*)    MCV 77.9 (*)    MCH 24.3 (*)    MCHC 31.2 (*)    All other components within normal limits  BASIC METABOLIC PANEL - Abnormal; Notable for the following:    Glucose, Bld 109 (*)    All other components within normal limits  TROPONIN I  TROPONIN I    ____________________________________________   EKG  I, Phineas Semen, attending physician, personally viewed and interpreted this EKG  EKG Time: 1656 Rate: 81 Rhythm: normal sinus rhythm Axis: normal Intervals: normal QRS: normal ST changes: no st elevation   ____________________________________________    RADIOLOGY  Chest x-ray IMPRESSION: Chronic changes without acute abnormality. ____________________________________________   PROCEDURES  Procedure(s) performed: None  Critical Care performed: No  ____________________________________________   INITIAL IMPRESSION / ASSESSMENT AND PLAN / ED COURSE  Pertinent labs & imaging results that were available during my care of the patient were reviewed by me and considered in my medical decision making (see chart for details).  Patient here with left-sided chest pain that is been on and off for roughly 1 week but worse the past couple of days. On exam patient in no acute distress. Some tenderness to palpation of the chest wall. EKG, blood work and chest x-ray without any acute pathology. Discussed and encouraged patient to follow up with her primary care physician.  ____________________________________________   FINAL CLINICAL IMPRESSION(S) / ED DIAGNOSES  Final diagnoses:  Chest pain, unspecified chest pain type     Phineas Semen, MD 06/28/14 2253

## 2014-06-28 NOTE — ED Notes (Signed)
Pt c/o chest pain/pressure on the left side that radiates into the left shoulder intermittent for the past 2 days, "feels like I have to make an effort to take a deep breathe".the patient is in NAD, respirations WNL, skin is warm and dry..Marland Kitchen

## 2014-06-28 NOTE — Discharge Instructions (Signed)
Please seek medical attention for any high fevers, chest pain, shortness of breath, change in behavior, persistent vomiting, bloody stool or any other new or concerning symptoms. ° °Chest Pain (Nonspecific) °It is often hard to give a specific diagnosis for the cause of chest pain. There is always a chance that your pain could be related to something serious, such as a heart attack or a blood clot in the lungs. You need to follow up with your health care provider for further evaluation. °CAUSES  °· Heartburn. °· Pneumonia or bronchitis. °· Anxiety or stress. °· Inflammation around your heart (pericarditis) or lung (pleuritis or pleurisy). °· A blood clot in the lung. °· A collapsed lung (pneumothorax). It can develop suddenly on its own (spontaneous pneumothorax) or from trauma to the chest. °· Shingles infection (herpes zoster virus). °The chest wall is composed of bones, muscles, and cartilage. Any of these can be the source of the pain. °· The bones can be bruised by injury. °· The muscles or cartilage can be strained by coughing or overwork. °· The cartilage can be affected by inflammation and become sore (costochondritis). °DIAGNOSIS  °Lab tests or other studies may be needed to find the cause of your pain. Your health care provider may have you take a test called an ambulatory electrocardiogram (ECG). An ECG records your heartbeat patterns over a 24-hour period. You may also have other tests, such as: °· Transthoracic echocardiogram (TTE). During echocardiography, sound waves are used to evaluate how blood flows through your heart. °· Transesophageal echocardiogram (TEE). °· Cardiac monitoring. This allows your health care provider to monitor your heart rate and rhythm in real time. °· Holter monitor. This is a portable device that records your heartbeat and can help diagnose heart arrhythmias. It allows your health care provider to track your heart activity for several days, if needed. °· Stress tests by  exercise or by giving medicine that makes the heart beat faster. °TREATMENT  °· Treatment depends on what may be causing your chest pain. Treatment may include: °¨ Acid blockers for heartburn. °¨ Anti-inflammatory medicine. °¨ Pain medicine for inflammatory conditions. °¨ Antibiotics if an infection is present. °· You may be advised to change lifestyle habits. This includes stopping smoking and avoiding alcohol, caffeine, and chocolate. °· You may be advised to keep your head raised (elevated) when sleeping. This reduces the chance of acid going backward from your stomach into your esophagus. °Most of the time, nonspecific chest pain will improve within 2-3 days with rest and mild pain medicine.  °HOME CARE INSTRUCTIONS  °· If antibiotics were prescribed, take them as directed. Finish them even if you start to feel better. °· For the next few days, avoid physical activities that bring on chest pain. Continue physical activities as directed. °· Do not use any tobacco products, including cigarettes, chewing tobacco, or electronic cigarettes. °· Avoid drinking alcohol. °· Only take medicine as directed by your health care provider. °· Follow your health care provider's suggestions for further testing if your chest pain does not go away. °· Keep any follow-up appointments you made. If you do not go to an appointment, you could develop lasting (chronic) problems with pain. If there is any problem keeping an appointment, call to reschedule. °SEEK MEDICAL CARE IF:  °· Your chest pain does not go away, even after treatment. °· You have a rash with blisters on your chest. °· You have a fever. °SEEK IMMEDIATE MEDICAL CARE IF:  °· You have increased chest   pain or pain that spreads to your arm, neck, jaw, back, or abdomen. °· You have shortness of breath. °· You have an increasing cough, or you cough up blood. °· You have severe back or abdominal pain. °· You feel nauseous or vomit. °· You have severe weakness. °· You  faint. °· You have chills. °This is an emergency. Do not wait to see if the pain will go away. Get medical help at once. Call your local emergency services (911 in [place] Do not drive yourself to the hospital. °MAKE SURE YOU:  °· Understand these instructions. °· Will watch your condition. °· Will get help right away if you are not doing well or get worse. °Document Released: 10/24/2004 Document Revised: 01/19/2013 Document Reviewed: 08/20/2007 °[employer]® Patient Information ©2015 [employer]. This information is not intended to replace advice given to you by your health care provider. Make sure you discuss any questions you have with your health care provider. ° °

## 2014-08-10 ENCOUNTER — Emergency Department: Payer: 59

## 2014-08-10 ENCOUNTER — Encounter: Payer: Self-pay | Admitting: Emergency Medicine

## 2014-08-10 ENCOUNTER — Emergency Department
Admission: EM | Admit: 2014-08-10 | Discharge: 2014-08-10 | Disposition: A | Discharge status: Home / Self Care | Payer: 59 | Attending: Emergency Medicine | Admitting: Emergency Medicine

## 2014-08-10 ENCOUNTER — Other Ambulatory Visit: Payer: Self-pay

## 2014-08-10 DIAGNOSIS — R079 Chest pain, unspecified: Secondary | ICD-10-CM | POA: Diagnosis not present

## 2014-08-10 DIAGNOSIS — Z79899 Other long term (current) drug therapy: Secondary | ICD-10-CM | POA: Diagnosis not present

## 2014-08-10 DIAGNOSIS — Z720 Tobacco use: Secondary | ICD-10-CM | POA: Diagnosis not present

## 2014-08-10 LAB — CBC
HEMATOCRIT: 36.2 % (ref 35.0–47.0)
HEMOGLOBIN: 11.3 g/dL — AB (ref 12.0–16.0)
MCH: 23.8 pg — ABNORMAL LOW (ref 26.0–34.0)
MCHC: 31.1 g/dL — AB (ref 32.0–36.0)
MCV: 76.4 fL — AB (ref 80.0–100.0)
Platelets: 256 10*3/uL (ref 150–440)
RBC: 4.74 MIL/uL (ref 3.80–5.20)
RDW: 14.1 % (ref 11.5–14.5)
WBC: 7.5 10*3/uL (ref 3.6–11.0)

## 2014-08-10 LAB — BASIC METABOLIC PANEL
ANION GAP: 5 (ref 5–15)
BUN: 12 mg/dL (ref 6–20)
CALCIUM: 8.8 mg/dL — AB (ref 8.9–10.3)
CO2: 26 mmol/L (ref 22–32)
Chloride: 105 mmol/L (ref 101–111)
Creatinine, Ser: 0.74 mg/dL (ref 0.44–1.00)
GFR calc Af Amer: >60 mL/min (ref >60)
GFR calc non Af Amer: >60 mL/min (ref >60)
GLUCOSE: 96 mg/dL (ref 65–99)
POTASSIUM: 4 mmol/L (ref 3.5–5.1)
Sodium: 136 mmol/L (ref 135–145)

## 2014-08-10 LAB — BRAIN NATRIURETIC PEPTIDE: B NATRIURETIC PEPTIDE 5: 6 pg/mL (ref 0.0–100.0)

## 2014-08-10 LAB — TROPONIN I: Troponin I: <0.03 ng/mL (ref <0.031)

## 2014-08-10 IMAGING — CR DG CHEST 2V
2 series · 2 of 2 positions shown · non-contrast
Comparison: [DATE]

CLINICAL DATA: Left side chest pain

EXAM:
CHEST  2 VIEW

[chest pa]
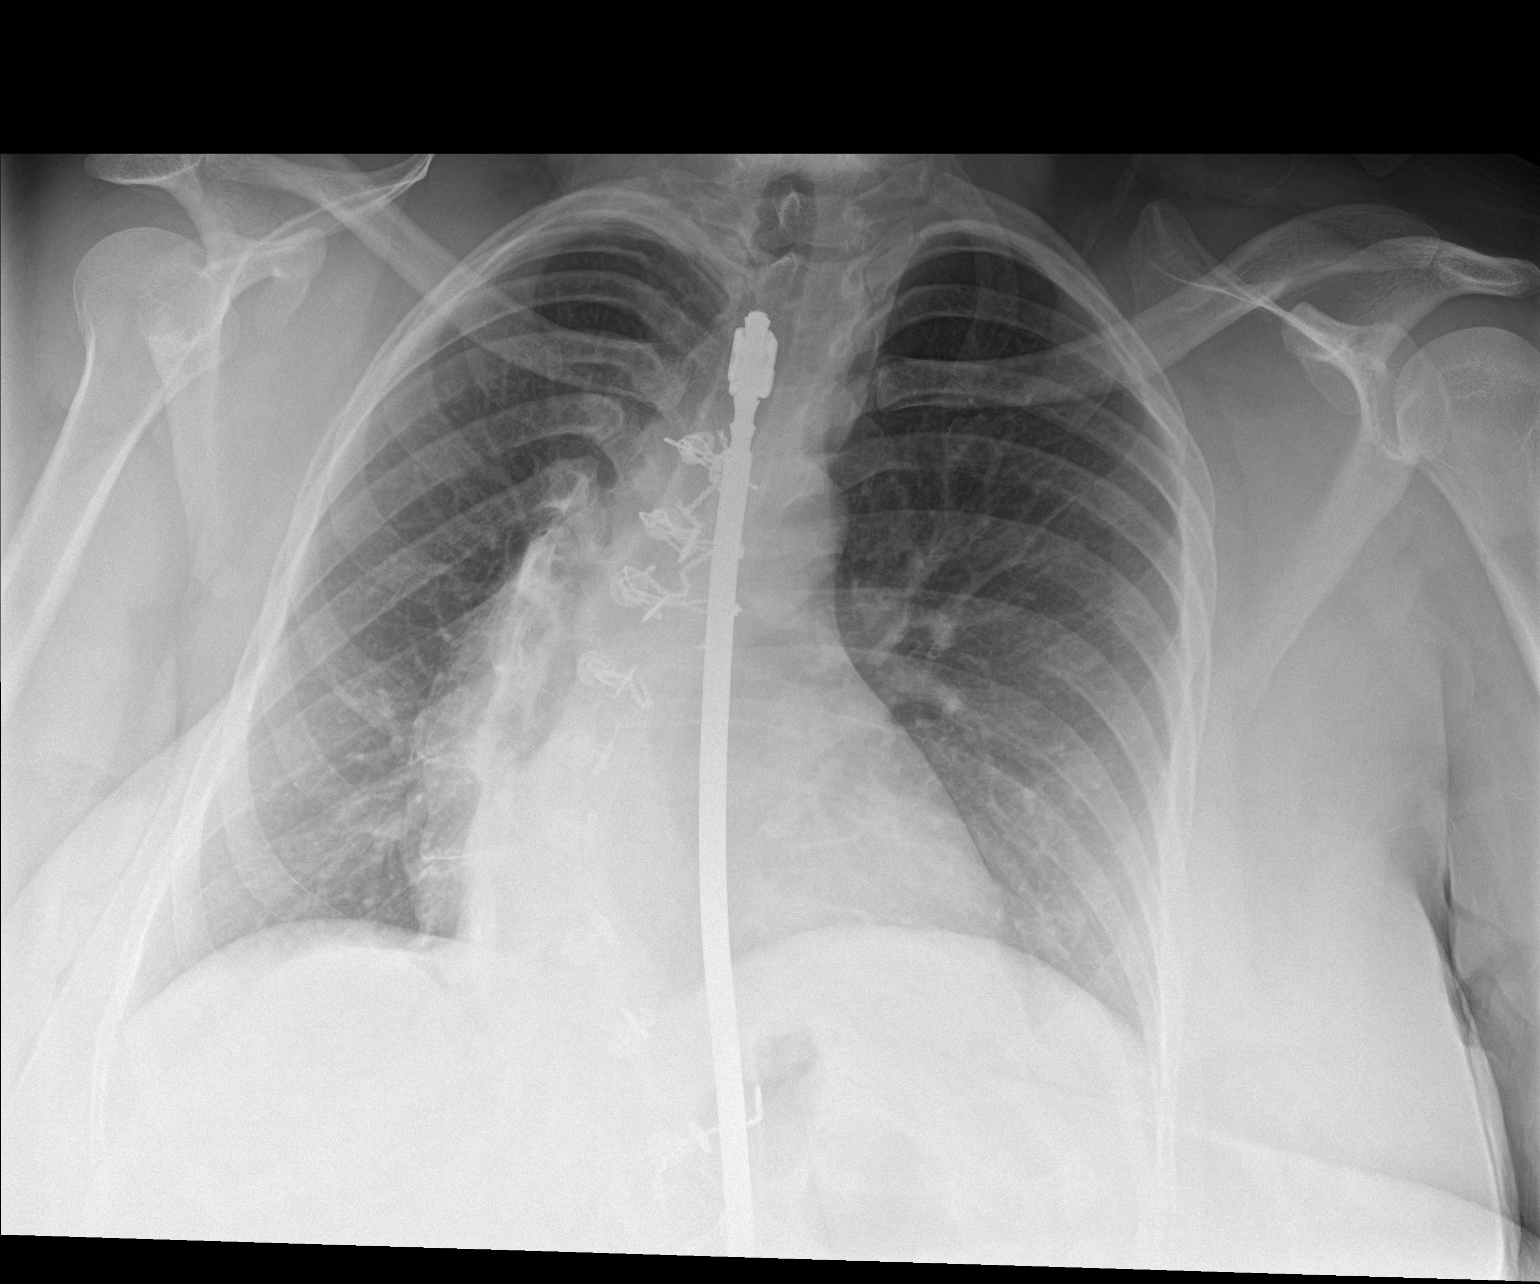

[chest lat]
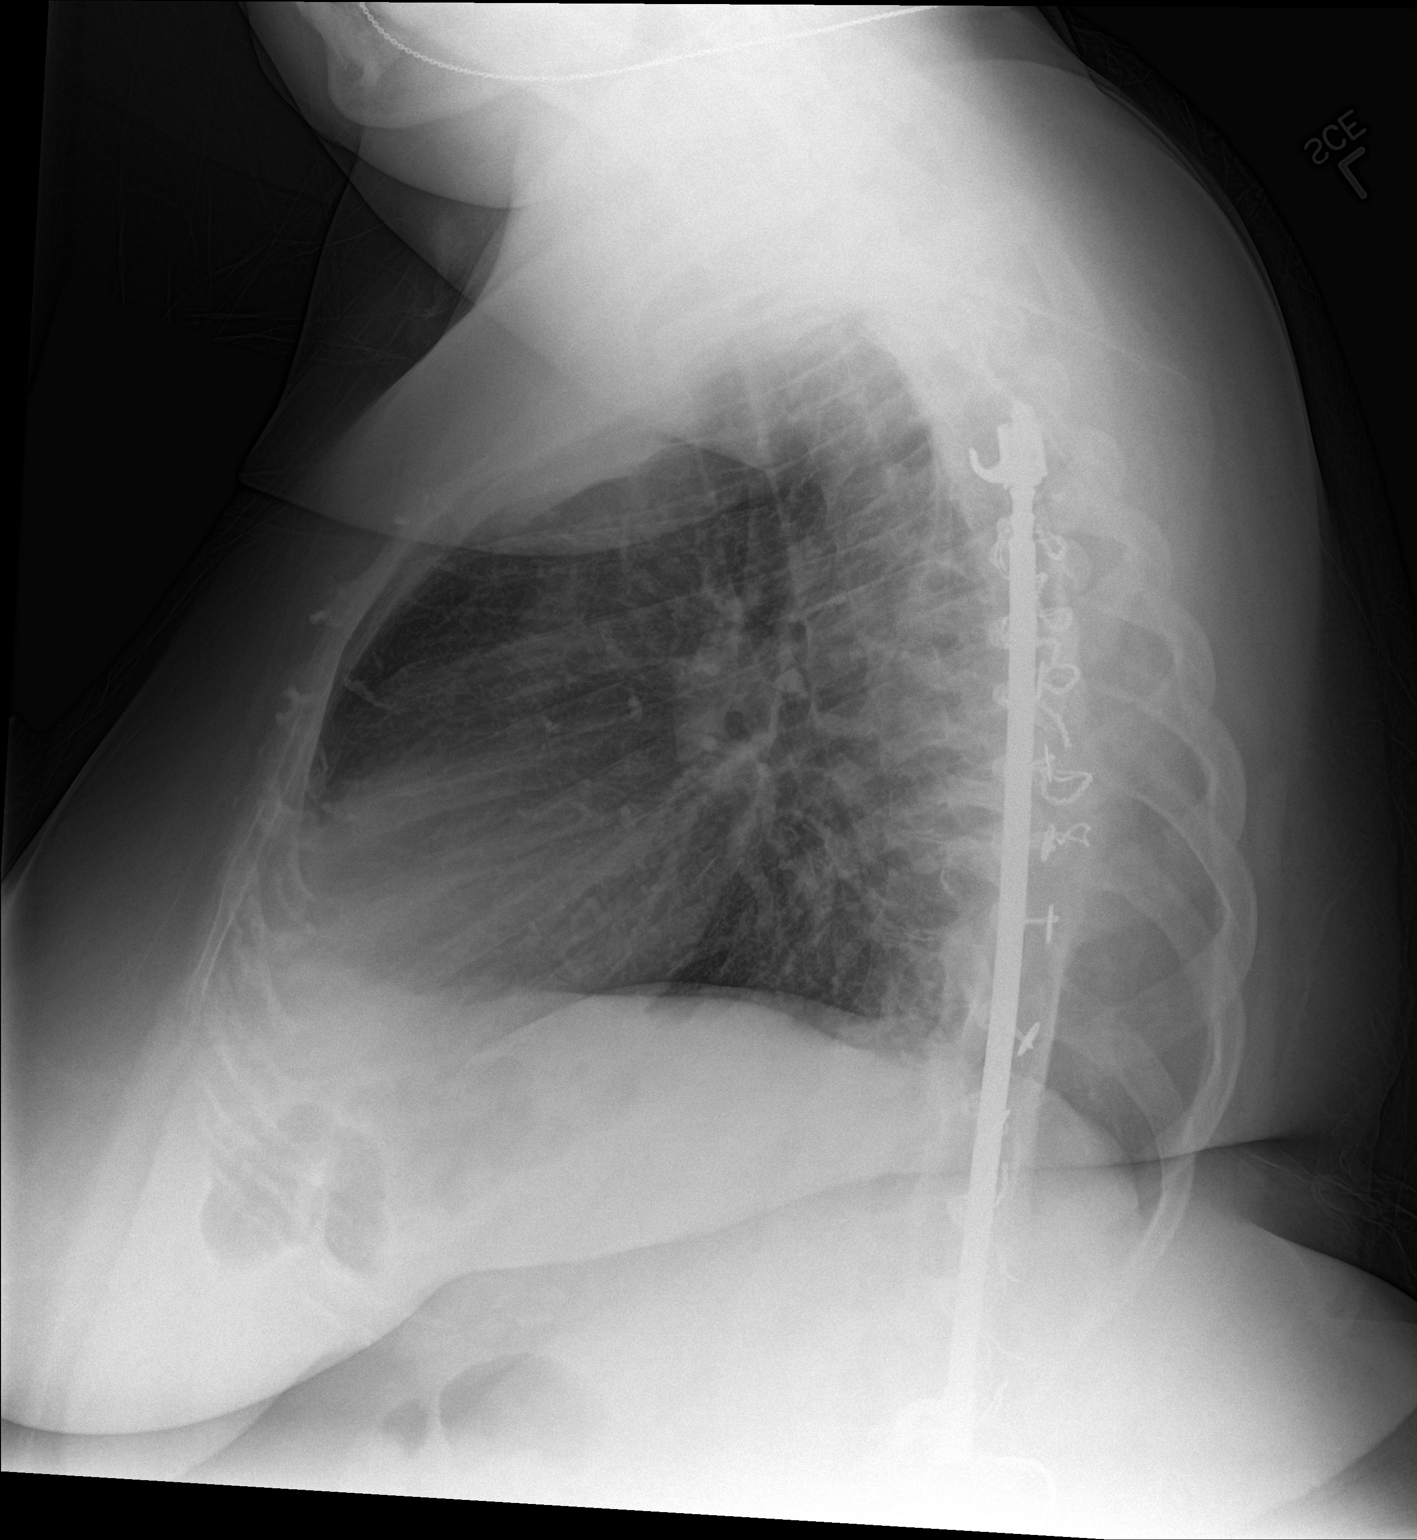

[2 of 2 positions shown; findings below may reference images not displayed]

FINDINGS: Cardiomediastinal silhouette is stable. Thoracic dextroscoliosis
again noted. Metallic fixation rods thoracolumbar spine again noted.
No acute infiltrate or pleural effusion. No pulmonary edema.
IMPRESSION: No active disease. Stable thoracic dextroscoliosis and postsurgical
changes thoracolumbar spine.

## 2014-08-10 MED ORDER — CYCLOBENZAPRINE HCL 10 MG PO TABS: 10.0000 mg | ORAL_TABLET | Freq: Three times a day (TID) | ORAL | Status: DC | PRN

## 2014-08-10 MED ORDER — NAPROXEN 500 MG PO TABS: 500.0000 mg | ORAL_TABLET | Freq: Two times a day (BID) | ORAL | Status: DC | PRN

## 2014-08-10 MED ORDER — KETOROLAC TROMETHAMINE 60 MG/2ML IM SOLN
60.0000 mg | Freq: Once | INTRAMUSCULAR | Status: AC
  Administered 2014-08-10: 60 mg via INTRAMUSCULAR
  Filled 2014-08-10: qty 2

## 2014-08-10 NOTE — ED Provider Notes (Signed)
[facility] Emergency Department Provider Note  ____________________________________________  Time seen: Approximately 110 PM  I have reviewed the triage vital signs and the nursing notes.   HISTORY  Chief Complaint Chest Pain    HPI Tracey White is a 34 y.o. female with history of anxiety who presents today with chest pain since 7 AM. The patient says that she awoke with this pain that is across her chest and radiating into her left arm into her back. She says that she has a heaviness with breathing. She says that pushing on her chest as well as moving her left arm hurts. She does not have any family history of cardiac disease. So does not have any history of blood clots. Does not take any hormone supplements. Has nausea but no vomiting. No diaphoresis.Denies any recent heavy lifting or strenuous activity that could've caused the pain to her chest and back.   Past Medical History  Diagnosis Date  . Scoliosis     There are no active problems to display for this patient.   Past Surgical History  Procedure Laterality Date  . Tubal ligation    . Hernia repair    . Scoliosis repair      Current Outpatient Rx  Name  Route  Sig  Dispense  Refill  . sertraline (ZOLOFT) 100 MG tablet   Oral   Take 100 mg by mouth daily.           Allergies Aspirin  No family history on file.  Social History History  Substance Use Topics  . Smoking status: Current Every Day Smoker -- 0.50 packs/day    Types: Cigarettes  . Smokeless tobacco: Never Used  . Alcohol Use: No    Review of Systems Constitutional: No fever/chills Eyes: No visual changes. ENT: No sore throat. Cardiovascular: As above  Respiratory: As above  Gastrointestinal: No abdominal pain.  no vomiting.  No diarrhea.  No constipation. Genitourinary: Negative for dysuria. Musculoskeletal: Negative for back pain. Skin: Negative for rash. Neurological: Negative for headaches, focal weakness  or numbness.  10-point ROS otherwise negative.  ____________________________________________   PHYSICAL EXAM:  VITAL SIGNS: ED Triage Vitals  Enc Vitals Group     BP 08/10/14 1011 121/90 mmHg     Pulse Rate 08/10/14 1011 81     Resp 08/10/14 1011 20     Temp 08/10/14 1011 97.9 F (36.6 C)     Temp Source 08/10/14 1011 Oral     SpO2 08/10/14 1011 99 %     Weight 08/10/14 1011 283 lb (128.368 kg)     Height 08/10/14 1011  (1.549 m)     Head Cir --      Peak Flow --      Pain Score 08/10/14 1012 10     Pain Loc --      Pain Edu? --      Excl. in GC? --     Constitutional: Alert and oriented. Well appearing and in no acute distress. Eyes: Conjunctivae are normal. PERRL. EOMI. Head: Atraumatic. Nose: No congestion/rhinnorhea. Mouth/Throat: Mucous membranes are moist.  Oropharynx non-erythematous. Neck: No stridor.   Cardiovascular: Normal rate, regular rhythm. Grossly normal heart sounds.  Good peripheral circulation. Reproducible tenderness to the anterior chest wall as well as the left thoracic spine over the soft tissue.   Respiratory: Normal respiratory effort.  No retractions. Lungs CTAB. Gastrointestinal: Soft and nontender. No distention. No abdominal bruits. No CVA tenderness. Musculoskeletal: No lower extremity  tenderness nor edema.  No joint effusions. Neurologic:  Normal speech and language. No gross focal neurologic deficits are appreciated. No gait instability. Skin:  Skin is warm, dry and intact. No rash noted. Psychiatric: Mood and affect are normal. Speech and behavior are normal.  ____________________________________________   LABS (all labs ordered are listed, but only abnormal results are displayed)  Labs Reviewed  BASIC METABOLIC PANEL - Abnormal; Notable for the following:    Calcium 8.8 (*)    All other components within normal limits  CBC - Abnormal; Notable for the following:    Hemoglobin 11.3 (*)    MCV 76.4 (*)    MCH 23.8 (*)    MCHC  31.1 (*)    All other components within normal limits  TROPONIN I  BRAIN NATRIURETIC PEPTIDE   ____________________________________________  EKG  ED ECG REPORT I, Arelia LongestSchaevitz,  Sujata Maines M, the attending physician, personally viewed and interpreted this ECG.   Date: 08/10/2014  EKG Time: 1011  Rate: 86  Rhythm: normal sinus rhythm. EKG read is undetermined rhythm but P waves seen in the rhythm strip of lead 2.  Axis: Normal axis  Intervals:none  ST&T Change: No abnormal T-wave inversions. No ST elevations or depressions.  ____________________________________________  RADIOLOGY  Chest x-ray without acute disease. I personally reviewed these images. ____________________________________________   PROCEDURES    ____________________________________________   INITIAL IMPRESSION / ASSESSMENT AND PLAN / ED COURSE  Pertinent labs & imaging results that were available during my care of the patient were reviewed by me and considered in my medical decision making (see chart for details).  ----------------------------------------- 2:48 PM on 08/10/2014 -----------------------------------------  Patient is resting comfortably. Asleep when I entered the room. Says the pain is improved after Toradol. Equal and bilateral radial as well as posterior tibial pulses. Feel that her pain is likely related to musculoskeletal issues. Furthermore, there was no neck tenderness and unlikely to be radiculopathy. We'll discharge home with Flexeril as well as Naprosyn. Counseled that she may also use icy hot or BenGay for muscle relief. [facility]. Patient is perc negative. ____________________________________________   FINAL CLINICAL IMPRESSION(S) / ED DIAGNOSES  Acute chest pain. Initial visit.    Myrna Blazeravid Matthew Takari Duncombe, MD 08/10/14 1450

## 2014-08-10 NOTE — Discharge Instructions (Signed)

## 2014-08-10 NOTE — ED Notes (Signed)
Pt presents with left sided chest pain and heaviness going down into left arm started this am.

## 2014-09-07 IMAGING — US US EXTREM LOW VENOUS BILAT
1 series · 13 of 24 positions shown · non-contrast
Comparison: None.

CLINICAL DATA: Bilateral lower extremity pain for 3 days



[Series 1: us extrem low venous bilat · 0.12mm/px · 13 of 59 slices shown]
[im 1/59]
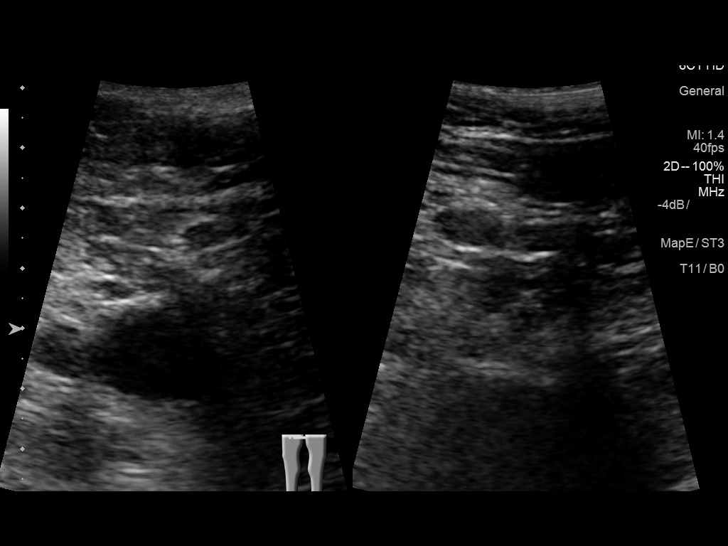
[im 6/59]
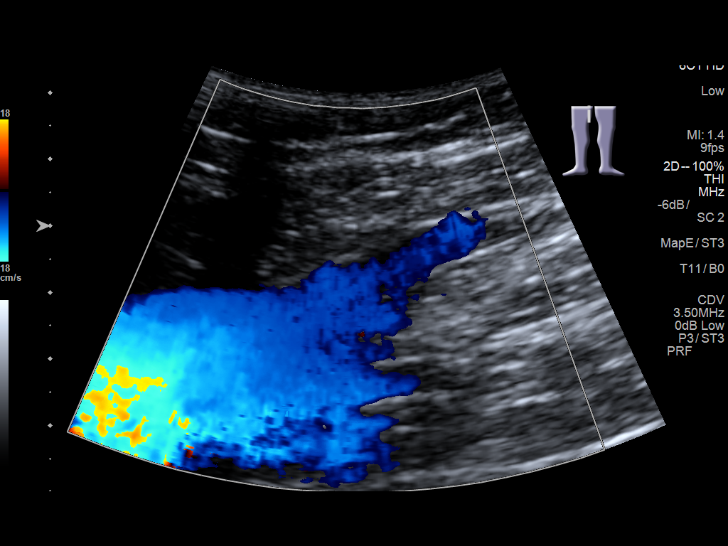
[im 11/59]
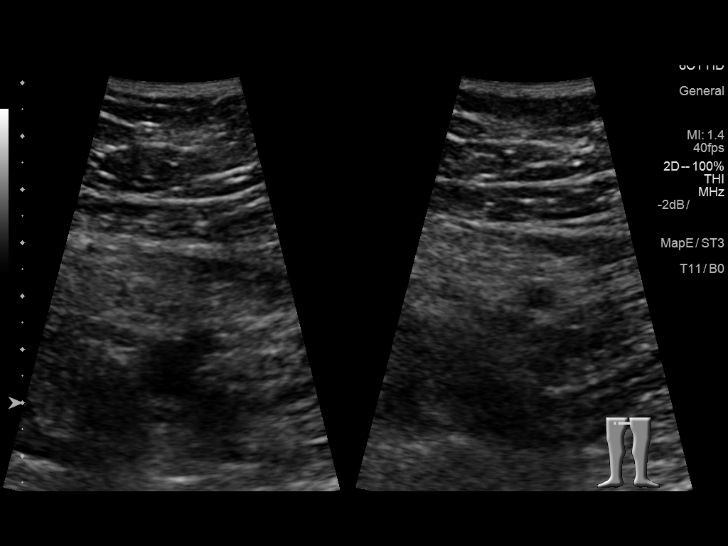
[im 16/59]
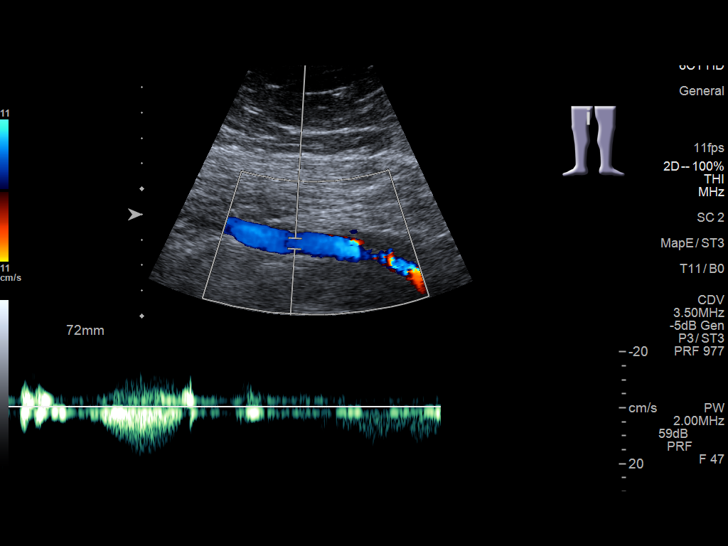
[im 21/59]
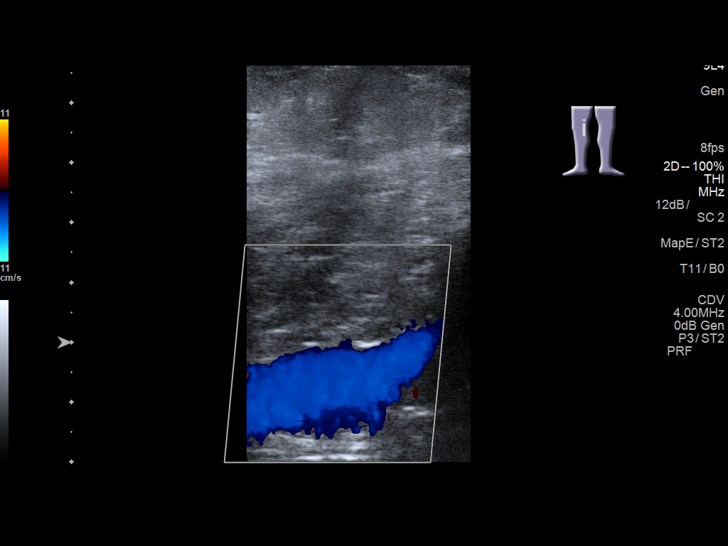
[im 26/59]
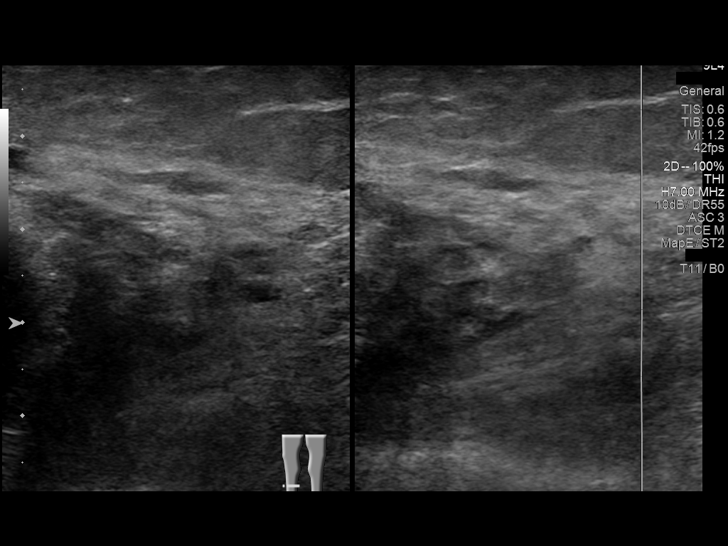
[im 31/59]
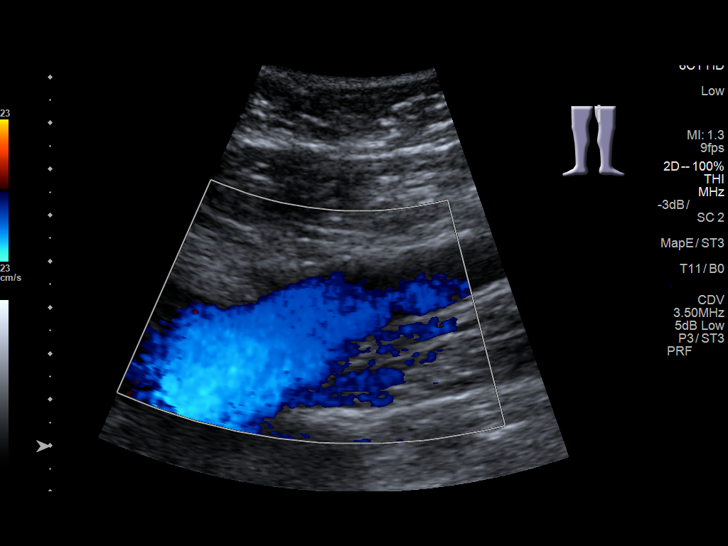
[im 33/59]
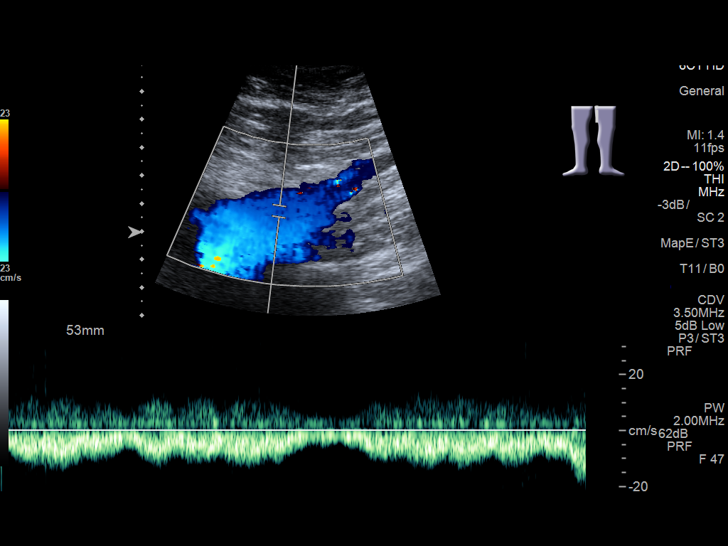
[im 38/59]
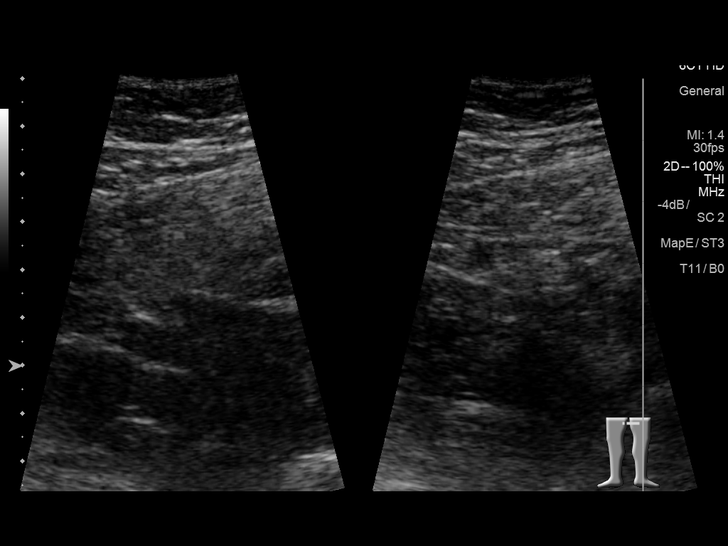
[im 43/59]
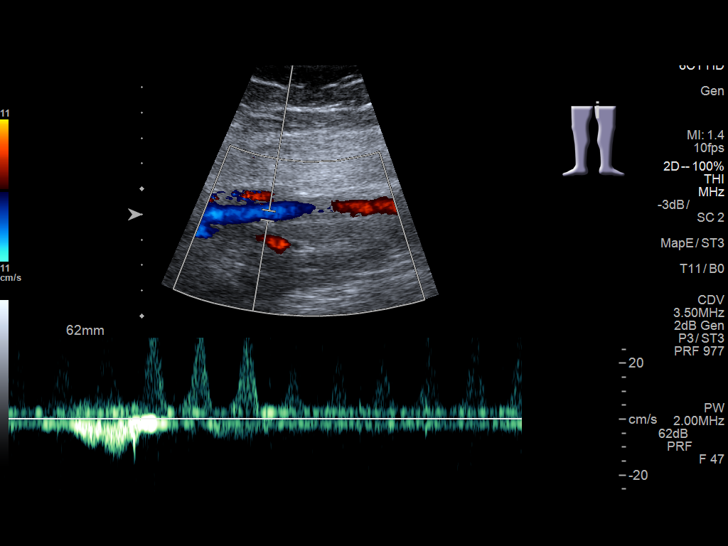
[im 48/59]
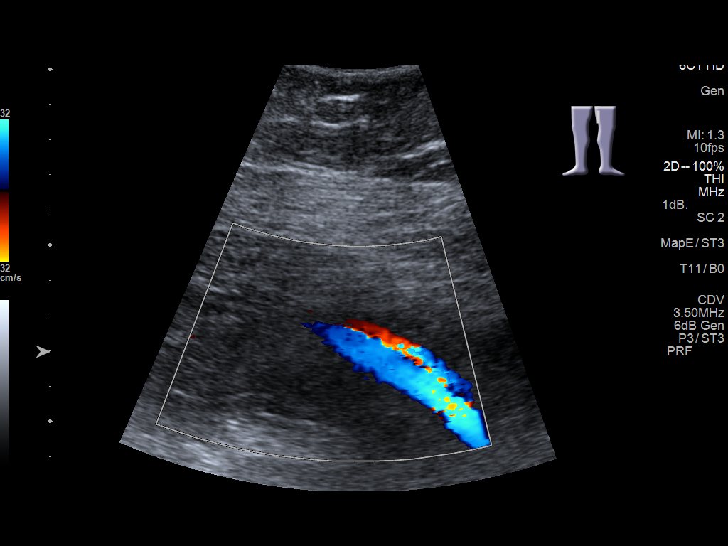
[im 53/59]
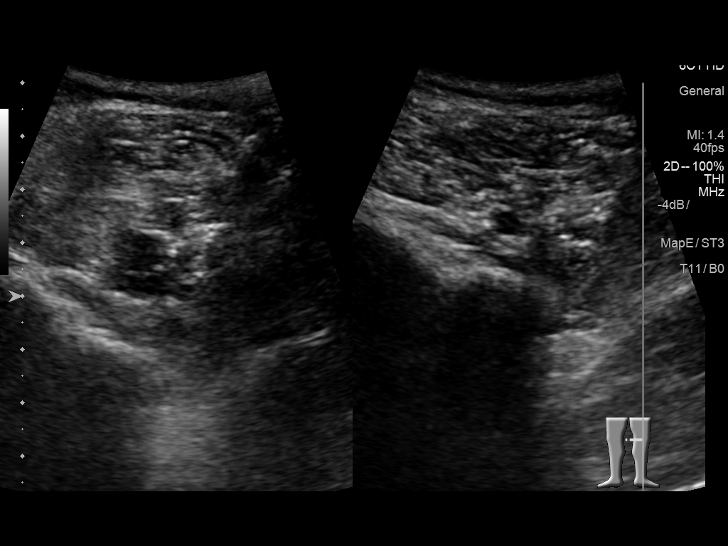
[im 59/59]
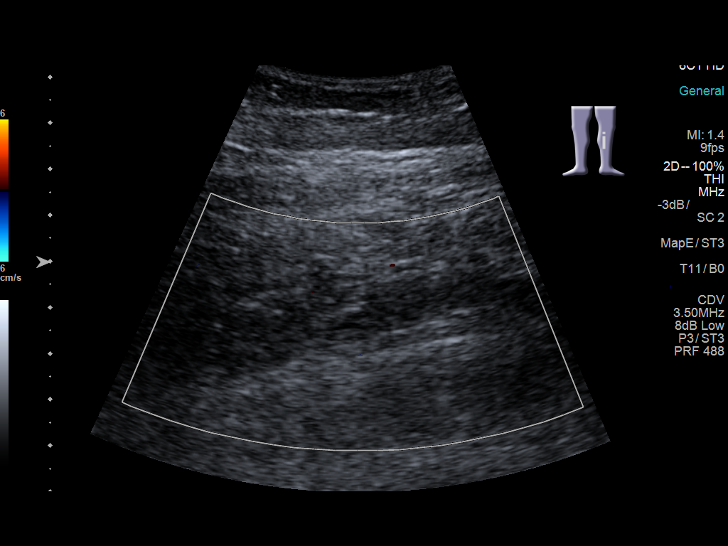

[13 of 24 positions shown; findings below may reference images not displayed]

FINDINGS: RIGHT LOWER EXTREMITY

Common Femoral Vein: No evidence of thrombus. Normal
compressibility, respiratory phasicity and response to augmentation.

Saphenofemoral Junction: No evidence of thrombus. Normal
compressibility and flow on color Doppler imaging.

Profunda Femoral Vein: No evidence of thrombus. Normal
compressibility and flow on color Doppler imaging.

Femoral Vein: No evidence of thrombus. Normal compressibility,
respiratory phasicity and response to augmentation.

Popliteal Vein: No evidence of thrombus. Normal compressibility,
respiratory phasicity and response to augmentation.

Calf Veins: Posterior tibial veins are visualized, compressible and
appear patent. Peroneal veins not visualized.

Superficial Great Saphenous Vein: No evidence of thrombus. Normal
compressibility and flow on color Doppler imaging.

Venous Reflux:  None.

Other Findings:  None.

LEFT LOWER EXTREMITY

Common Femoral Vein: No evidence of thrombus. Normal
compressibility, respiratory phasicity and response to augmentation.

Saphenofemoral Junction: No evidence of thrombus. Normal
compressibility and flow on color Doppler imaging.

Profunda Femoral Vein: No evidence of thrombus. Normal
compressibility and flow on color Doppler imaging.

Femoral Vein: No evidence of thrombus. Normal compressibility,
respiratory phasicity and response to augmentation.

Popliteal Vein: No evidence of thrombus. Normal compressibility,
respiratory phasicity and response to augmentation.

Calf Veins: Posterior tibial veins are visualized, compressible and
appear patent. Peroneal veins not visualized.

Superficial Great Saphenous Vein: No evidence of thrombus. Normal
compressibility and flow on color Doppler imaging.

Venous Reflux:  None.

Other Findings:  None.
IMPRESSION: No significant occlusive femoral popliteal DVT in either extremity.
Limited assessment of the calf veins because of body habitus.

## 2014-10-10 ENCOUNTER — Emergency Department: Payer: 59

## 2014-10-10 ENCOUNTER — Emergency Department
Admission: EM | Admit: 2014-10-10 | Discharge: 2014-10-10 | Disposition: A | Discharge status: Home / Self Care | Payer: 59 | Attending: Emergency Medicine | Admitting: Emergency Medicine

## 2014-10-10 ENCOUNTER — Encounter: Payer: Self-pay | Admitting: Emergency Medicine

## 2014-10-10 DIAGNOSIS — R0602 Shortness of breath: Secondary | ICD-10-CM | POA: Diagnosis not present

## 2014-10-10 DIAGNOSIS — Z720 Tobacco use: Secondary | ICD-10-CM | POA: Insufficient documentation

## 2014-10-10 DIAGNOSIS — M79604 Pain in right leg: Secondary | ICD-10-CM | POA: Diagnosis not present

## 2014-10-10 DIAGNOSIS — Z79899 Other long term (current) drug therapy: Secondary | ICD-10-CM | POA: Diagnosis not present

## 2014-10-10 DIAGNOSIS — R609 Edema, unspecified: Secondary | ICD-10-CM

## 2014-10-10 DIAGNOSIS — R05 Cough: Secondary | ICD-10-CM | POA: Insufficient documentation

## 2014-10-10 DIAGNOSIS — R2243 Localized swelling, mass and lump, lower limb, bilateral: Secondary | ICD-10-CM | POA: Insufficient documentation

## 2014-10-10 LAB — CBC WITH DIFFERENTIAL/PLATELET
Basophils Absolute: 0 10*3/uL (ref 0–0.1)
Basophils Relative: 0 %
EOS ABS: 0.3 10*3/uL (ref 0–0.7)
EOS PCT: 3 %
HCT: 35.1 % (ref 35.0–47.0)
HEMOGLOBIN: 11.3 g/dL — AB (ref 12.0–16.0)
LYMPHS ABS: 1.8 10*3/uL (ref 1.0–3.6)
Lymphocytes Relative: 20 %
MCH: 24.7 pg — AB (ref 26.0–34.0)
MCHC: 32.1 g/dL (ref 32.0–36.0)
MCV: 76.9 fL — ABNORMAL LOW (ref 80.0–100.0)
MONO ABS: 0.5 10*3/uL (ref 0.2–0.9)
Monocytes Relative: 6 %
NEUTROS PCT: 71 %
Neutro Abs: 6.2 10*3/uL (ref 1.4–6.5)
Platelets: 256 10*3/uL (ref 150–440)
RBC: 4.57 MIL/uL (ref 3.80–5.20)
RDW: 15.1 % — ABNORMAL HIGH (ref 11.5–14.5)
WBC: 8.8 10*3/uL (ref 3.6–11.0)

## 2014-10-10 LAB — BASIC METABOLIC PANEL
Anion gap: 6 (ref 5–15)
BUN: 10 mg/dL (ref 6–20)
CHLORIDE: 106 mmol/L (ref 101–111)
CO2: 24 mmol/L (ref 22–32)
CREATININE: 0.69 mg/dL (ref 0.44–1.00)
Calcium: 8.9 mg/dL (ref 8.9–10.3)
GFR calc Af Amer: >60 mL/min (ref >60)
GFR calc non Af Amer: >60 mL/min (ref >60)
Glucose, Bld: 93 mg/dL (ref 65–99)
Potassium: 4.1 mmol/L (ref 3.5–5.1)
SODIUM: 136 mmol/L (ref 135–145)

## 2014-10-10 LAB — BRAIN NATRIURETIC PEPTIDE: B NATRIURETIC PEPTIDE 5: 12 pg/mL (ref 0.0–100.0)

## 2014-10-10 LAB — TROPONIN I: Troponin I: <0.03 ng/mL (ref <0.031)

## 2014-10-10 IMAGING — CR DG CHEST 2V
2 series · 2 of 2 positions shown · non-contrast
Comparison: [DATE] and prior radiographs dating back to
[DATE]

CLINICAL DATA: Shortness of breath, cough and headache for 3 days.

EXAM:
CHEST  2 VIEW

[chest pa]
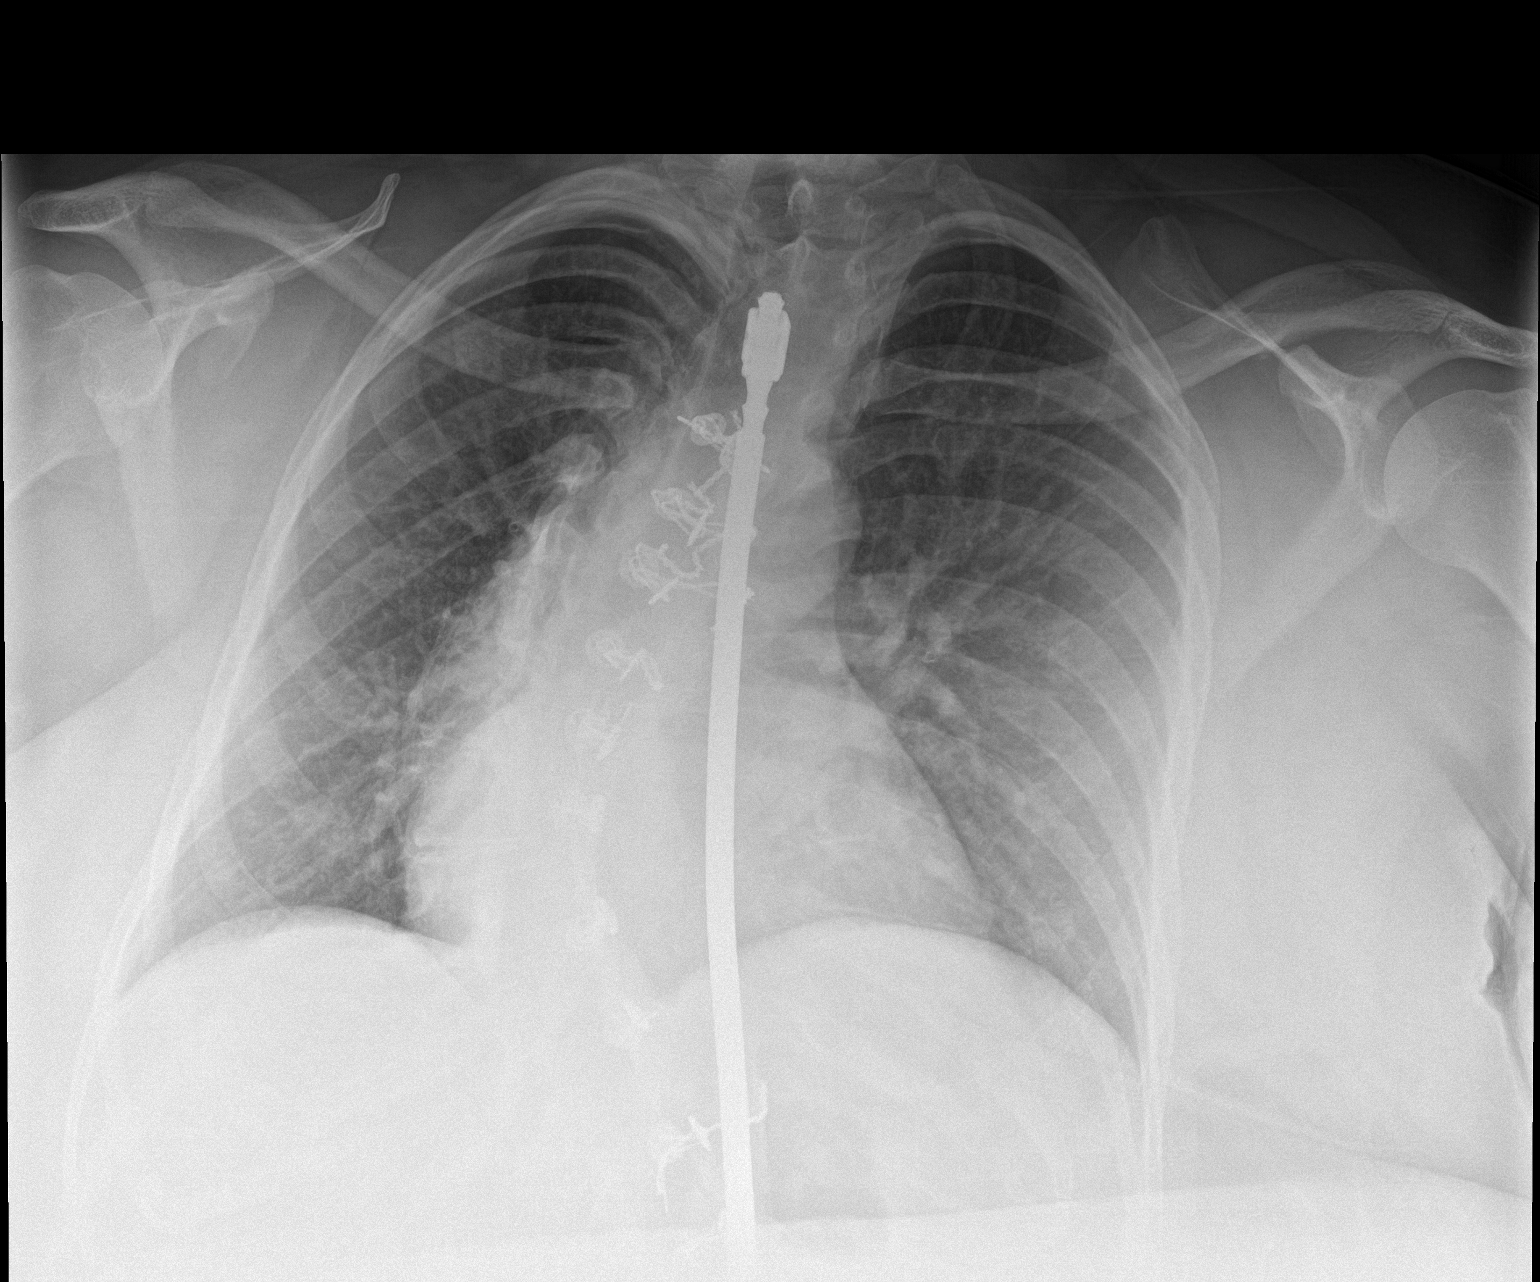

[chest lat]
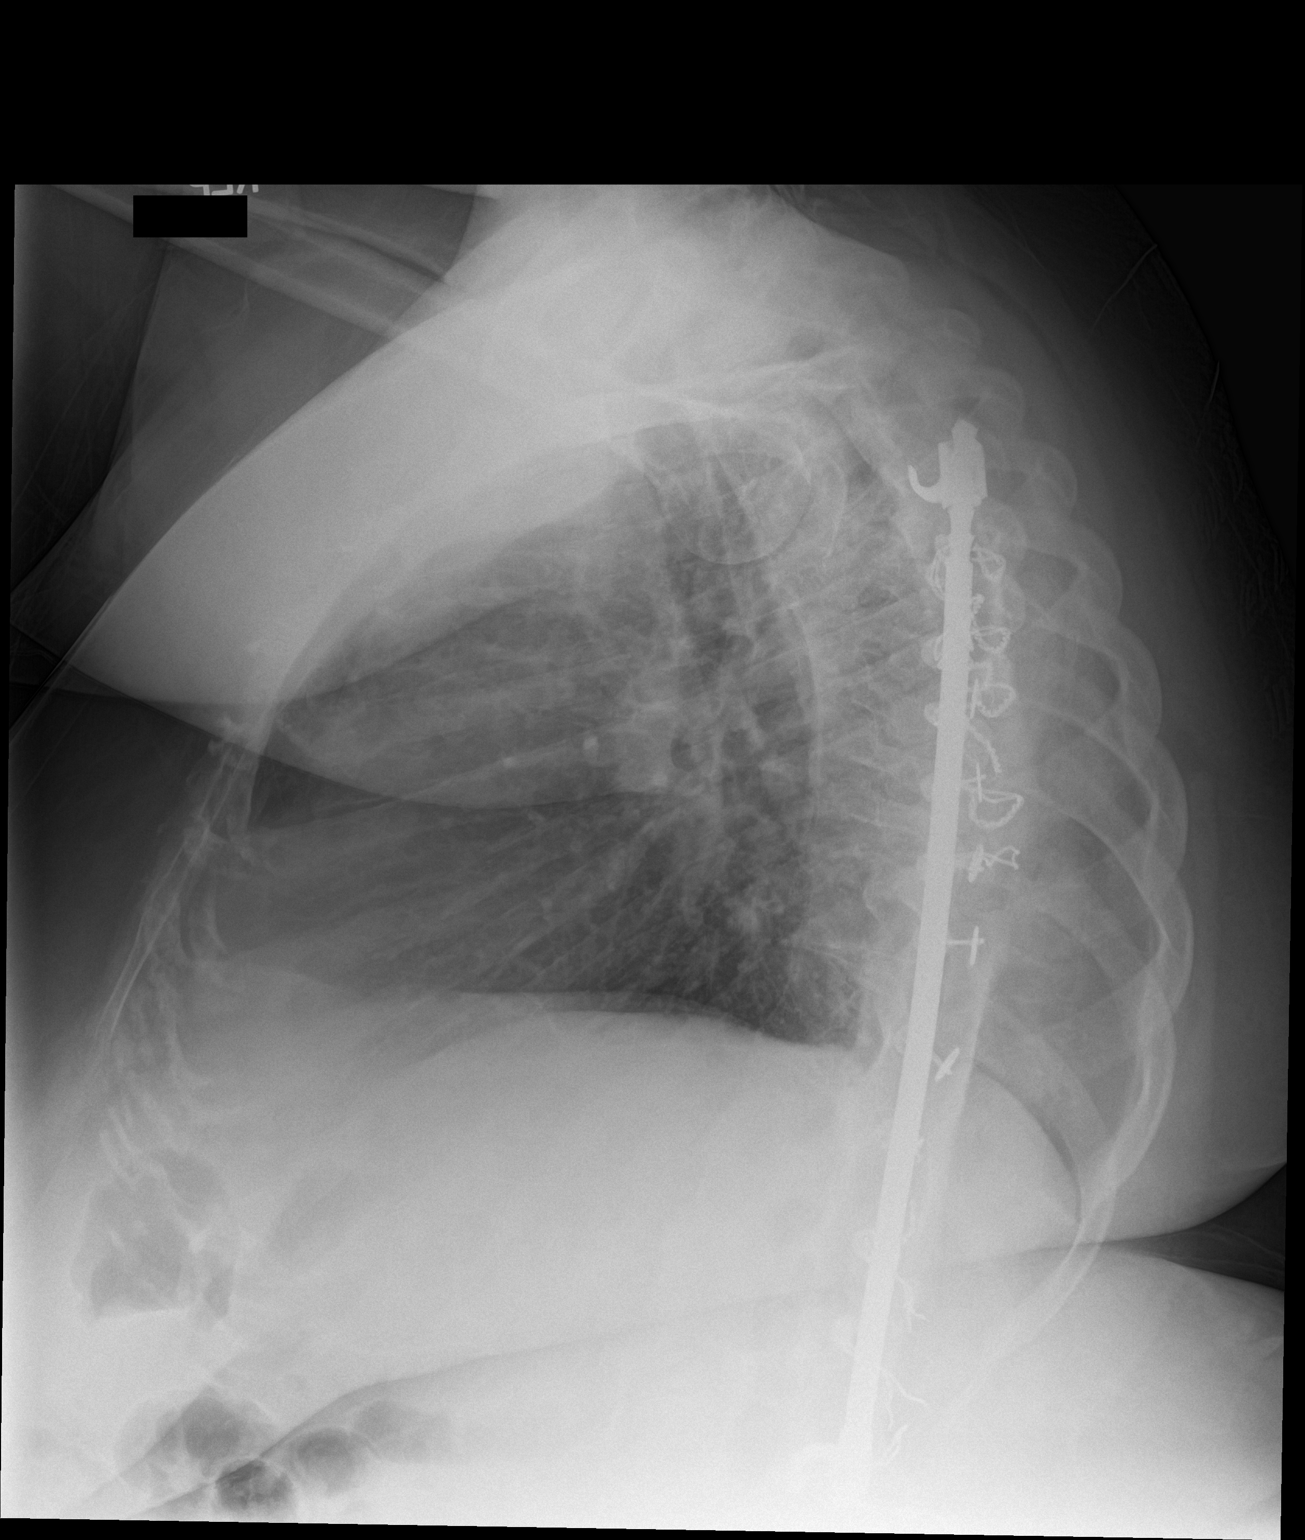

[2 of 2 positions shown; findings below may reference images not displayed]

FINDINGS: Mild cardiomegaly and mild pulmonary vascular congestion noted.

There is no evidence of focal airspace disease, pulmonary edema,
suspicious pulmonary nodule/mass, pleural effusion, or pneumothorax.

No acute bony abnormalities are identified.

Severe thoracolumbar scoliosis and surgical hardware again noted.
IMPRESSION: Mild cardiomegaly with mild pulmonary vascular congestion.

## 2014-10-10 MED ORDER — ACETAMINOPHEN 325 MG PO TABS
650.0000 mg | ORAL_TABLET | Freq: Once | ORAL | Status: AC
  Administered 2014-10-10: 650 mg via ORAL
  Filled 2014-10-10: qty 2

## 2014-10-10 NOTE — ED Provider Notes (Signed)
[facility] Emergency Department Provider Note   ____________________________________________  Time seen: 1830  I have reviewed the triage vital signs and the nursing notes.   HISTORY  Chief Complaint Leg Swelling   History limited by: Not Limited   HPI Tracey White is a 34 y.o. female who presents to the emergency department today because of concerns for lower extremity swelling, difficulty breathing and cough.The patient states that she has been having bilateral lower extremity swelling for the past month. She states it has been progressive. She states that she has also had some right lower extremity pain. Patient denies any recent travel however is a smoker. Denies any estrogen use. Denies any history of blood clots. Today the patient had worsening shortness of breath. She states this started yesterday. She states overnight she had to sit up. She has had accompanying cough. She has never had symptoms like these in the past. No fevers.     Past Medical History  Diagnosis Date  . Scoliosis     There are no active problems to display for this patient.   Past Surgical History  Procedure Laterality Date  . Tubal ligation    . Hernia repair    . Scoliosis repair      Current Outpatient Rx  Name  Route  Sig  Dispense  Refill  . cyclobenzaprine (FLEXERIL) 10 MG tablet   Oral   Take 1 tablet (10 mg total) by mouth 3 (three) times daily as needed for muscle spasms.   15 tablet   1   . naproxen (NAPROSYN) 500 MG tablet   Oral   Take 1 tablet (500 mg total) by mouth 2 (two) times daily as needed for mild pain or moderate pain.   10 tablet   0   . sertraline (ZOLOFT) 100 MG tablet   Oral   Take 100 mg by mouth daily.           Allergies Aspirin  No family history on file.  Social History Social History  Substance Use Topics  . Smoking status: Current Every Day Smoker -- 0.50 packs/day    Types: Cigarettes  . Smokeless tobacco: Never  Used  . Alcohol Use: No    Review of Systems  Constitutional: Negative for fever. Cardiovascular: Negative for chest pain. Respiratory: Positive for shortness of breath. Gastrointestinal: Negative for abdominal pain, vomiting and diarrhea. Genitourinary: Negative for dysuria. Musculoskeletal: Negative for back pain. Positive for lower extremity swelling Skin: Negative for rash. Neurological: Negative for headaches, focal weakness or numbness.   10-point ROS otherwise negative.  ____________________________________________   PHYSICAL EXAM:  VITAL SIGNS: ED Triage Vitals  Enc Vitals Group     BP 10/10/14 1548 134/85 mmHg     Pulse Rate 10/10/14 1548 99     Resp 10/10/14 1548 22     Temp 10/10/14 1548 98.2 F (36.8 C)     Temp Source 10/10/14 1548 Oral     SpO2 10/10/14 1548 96 %     Weight 10/10/14 1548 294 lb (133.358 kg)     Height 10/10/14 1548 5\' 1"  (1.549 m)     Head Cir --      Peak Flow --      Pain Score 10/10/14 1549 10   Constitutional: Alert and oriented. Well appearing and in no distress. Eyes: Conjunctivae are normal. PERRL. Normal extraocular movements. ENT   Head: Normocephalic and atraumatic.   Nose: No congestion/rhinnorhea.   Mouth/Throat: Mucous membranes are moist.  Neck: No stridor. Hematological/Lymphatic/Immunilogical: No cervical lymphadenopathy. Cardiovascular: Normal rate, regular rhythm.  No murmurs, rubs, or gallops. Respiratory: Normal respiratory effort without tachypnea nor retractions. Breath sounds are clear and equal bilaterally. No wheezes/rales/rhonchi. Gastrointestinal: Soft and nontender. No distention. Genitourinary: Deferred Musculoskeletal: Normal range of motion in all extremities. No joint effusions. Bilateral lower extremity swelling, right greater than left. Additionally patient with tenderness to palpation of the right calf and positive Homans sign on the right. Neurologic:  Normal speech and language. No  gross focal neurologic deficits are appreciated. Speech is normal.  Skin:  Skin is warm, dry and intact. No rash noted. Psychiatric: Mood and affect are normal. Speech and behavior are normal. Patient exhibits appropriate insight and judgment.  ____________________________________________    LABS (pertinent positives/negatives)  Labs Reviewed  CBC WITH DIFFERENTIAL/PLATELET - Abnormal; Notable for the following:    Hemoglobin 11.3 (*)    MCV 76.9 (*)    MCH 24.7 (*)    RDW 15.1 (*)    All other components within normal limits  BASIC METABOLIC PANEL  BRAIN NATRIURETIC PEPTIDE  TROPONIN I     ____________________________________________   EKG  I, [facility] Semen, attending physician, personally viewed and interpreted this EKG  EKG Time: 1601 Rate: 90 Rhythm: NSR Axis: normal Intervals: qtc 440 QRS: narrow ST changes: no st elevation Impression: normal ekg   ____________________________________________    RADIOLOGY  CXR  IMPRESSION: Mild cardiomegaly with mild pulmonary vascular congestion.  US Venous Img Lower IMPRESSION: No evidence of deep venous thrombosis.  ____________________________________________   PROCEDURES  Procedure(s) performed: None  Critical Care performed: No  ____________________________________________   INITIAL IMPRESSION / ASSESSMENT AND PLAN / ED COURSE  Pertinent labs & imaging results that were available during my care of the patient were reviewed by me and considered in my medical decision making (see chart for details).  Patient presented to the emergency department today with primary complaints of bilateral lower extremity swelling and shortness of breath. Doppler was performed of the right lower extremity which is tender, this did not show any deep vein thrombosis. Additionally chest x-ray did not show any infiltrate or pulmonary edema. Blood work did not show any elevation of BNP.at this point unclear etiology bilateral  lower extremity swelling. I did recommend compression stockings following up with her primary care doctor. At this point I doubt blood clots or heart failure. Furthermore the shortness breath is of unclear etiology, however with a negative chest x-ray , pneumonia, pneumothorax, pulmonary edema extremely unlikely.  ____________________________________________   FINAL CLINICAL IMPRESSION(S) / ED DIAGNOSES  Final diagnoses:  Swelling     [facility] Semen, MD 10/10/14 2157

## 2014-10-10 NOTE — ED Notes (Signed)
BLE swelling for about 1 month  But today noticed SOB and palpation and headache

## 2014-10-10 NOTE — ED Notes (Signed)
Patient transported to X-ray 

## 2014-10-10 NOTE — Discharge Instructions (Signed)
As we discussed please try using compression stockings for your legs. Please seek medical attention for any high fevers, chest pain, shortness of breath, change in behavior, persistent vomiting, bloody stool or any other new or concerning symptoms.   Peripheral Edema You have swelling in your legs (peripheral edema). This swelling is due to excess accumulation of salt and water in your body. Edema may be a sign of heart, kidney or liver disease, or a side effect of a medication. It may also be due to problems in the leg veins. Elevating your legs and using special support stockings may be very helpful, if the cause of the swelling is due to poor venous circulation. Avoid long periods of standing, whatever the cause. Treatment of edema depends on identifying the cause. Chips, pretzels, pickles and other salty foods should be avoided. Restricting salt in your diet is almost always needed. Water pills (diuretics) are often used to remove the excess salt and water from your body via urine. These medicines prevent the kidney from reabsorbing sodium. This increases urine flow. Diuretic treatment may also result in lowering of potassium levels in your body. Potassium supplements may be needed if you have to use diuretics daily. Daily weights can help you keep track of your progress in clearing your edema. You should call your caregiver for follow up care as recommended. SEEK IMMEDIATE MEDICAL CARE IF:   You have increased swelling, pain, redness, or heat in your legs.  You develop shortness of breath, especially when lying down.  You develop chest or abdominal pain, weakness, or fainting.  You have a fever. Document Released: 02/22/2004 Document Revised: 04/08/2011 Document Reviewed: 02/01/2009 [employer] Patient Information 2015 [place], [place]. This information is not intended to replace advice given to you by your health care provider. Make sure you discuss any questions you have with your health care  provider.

## 2014-11-14 IMAGING — US US EXTREM LOW VENOUS*R*
1 series · 13 of 24 positions shown · non-contrast
Comparison: [DATE].

CLINICAL DATA: Right lower extremity swelling times 2-3 days



[Series 1: us extrem low venous*right* · 0.10mm/px · 13 of 34 slices shown]
[im 1/34]
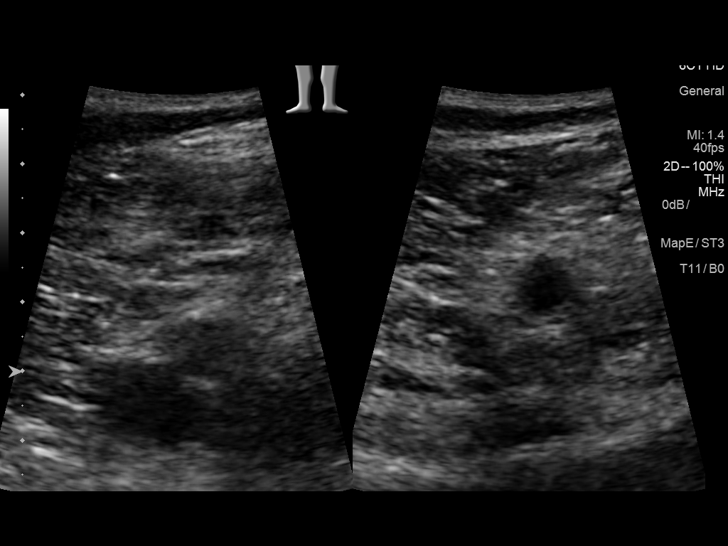
[im 3/34]
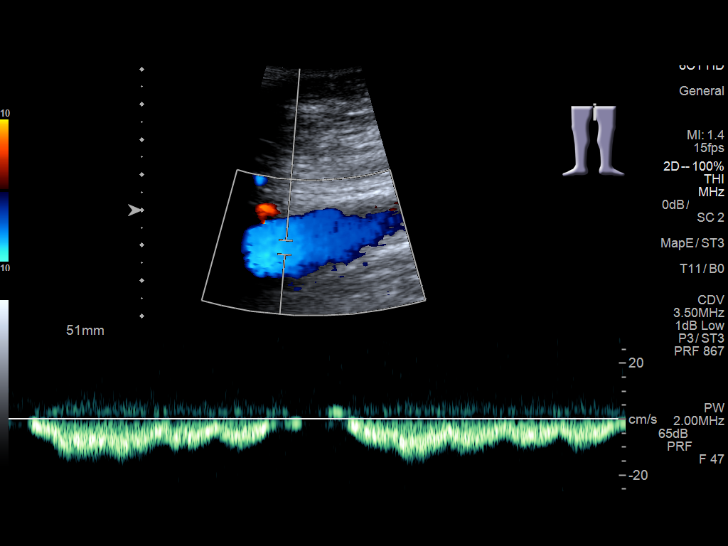
[im 6/34]
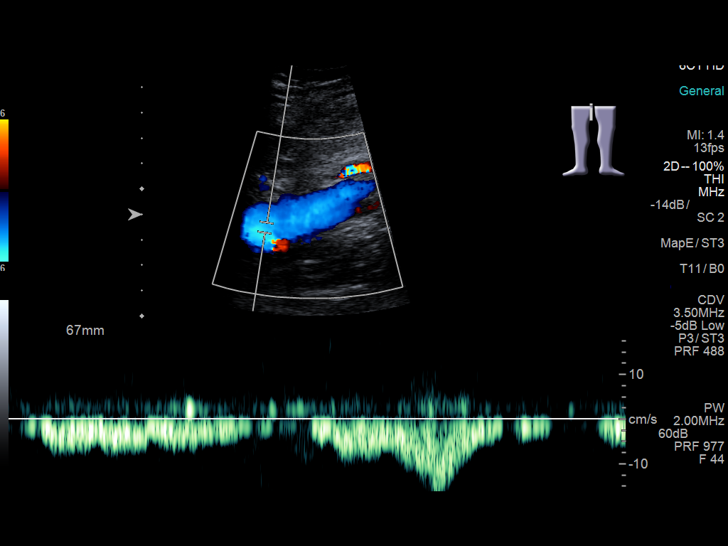
[im 9/34]
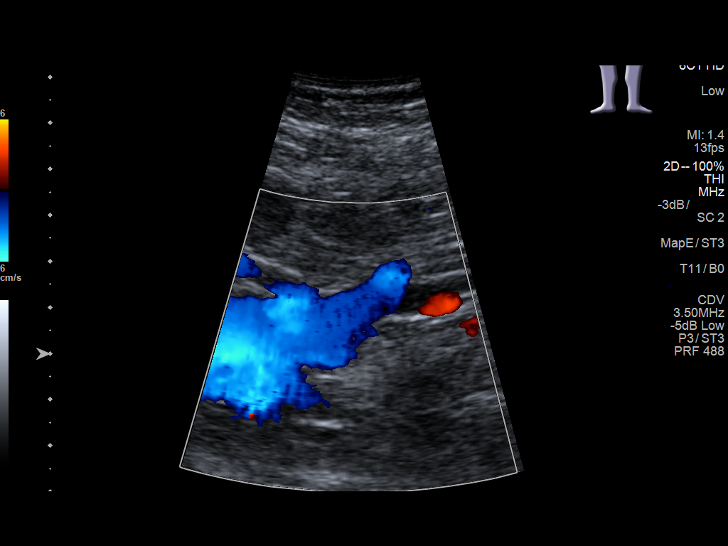
[im 12/34]
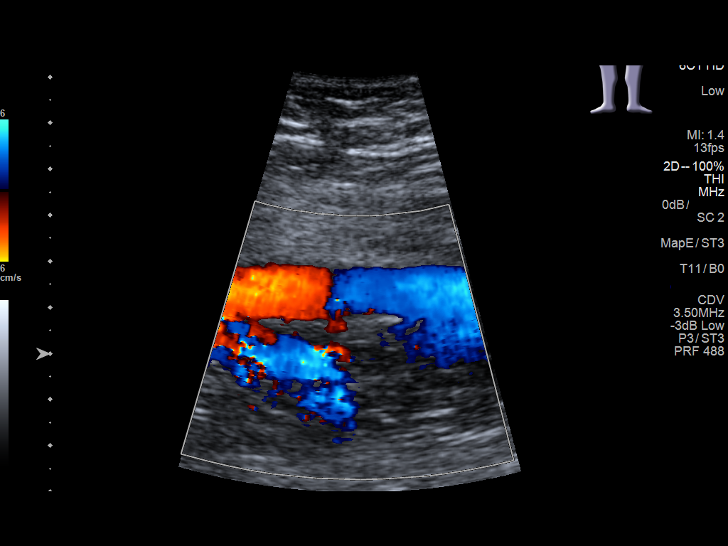
[im 15/34]
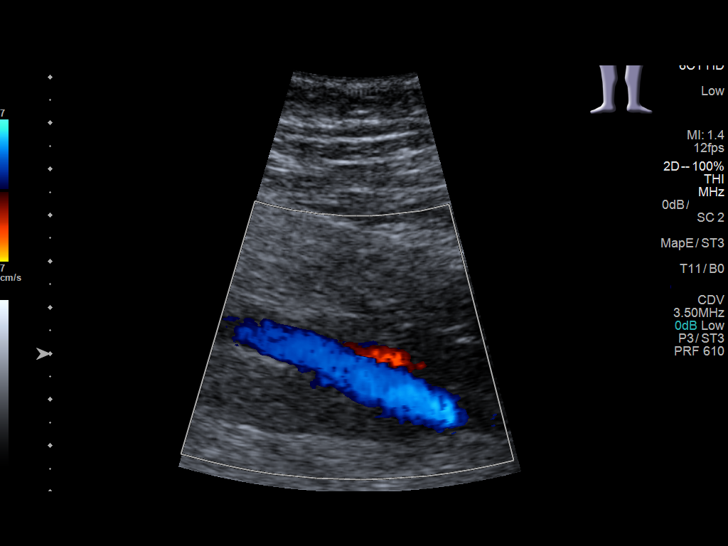
[im 18/34]
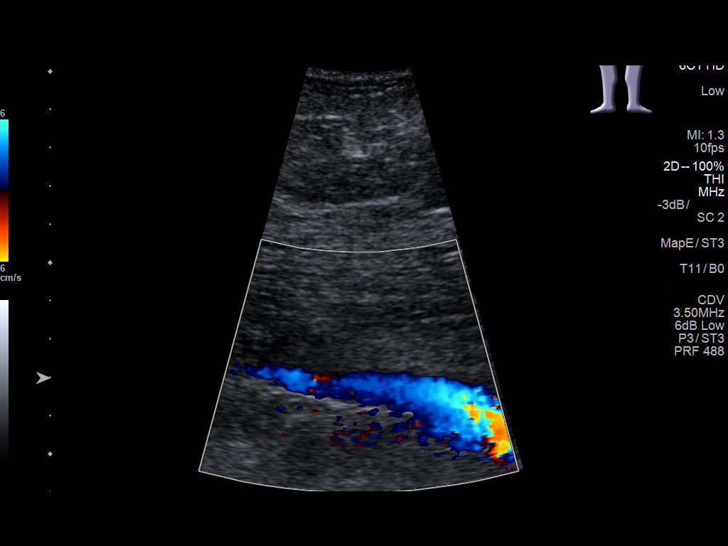
[im 19/34]
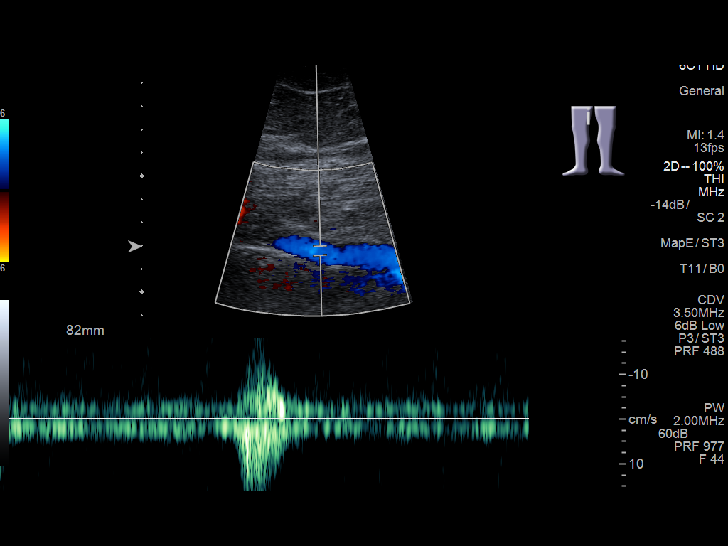
[im 22/34]
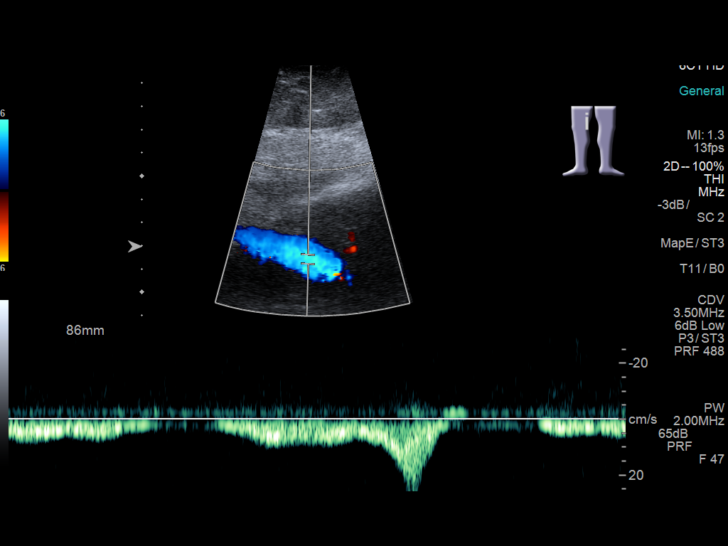
[im 25/34]
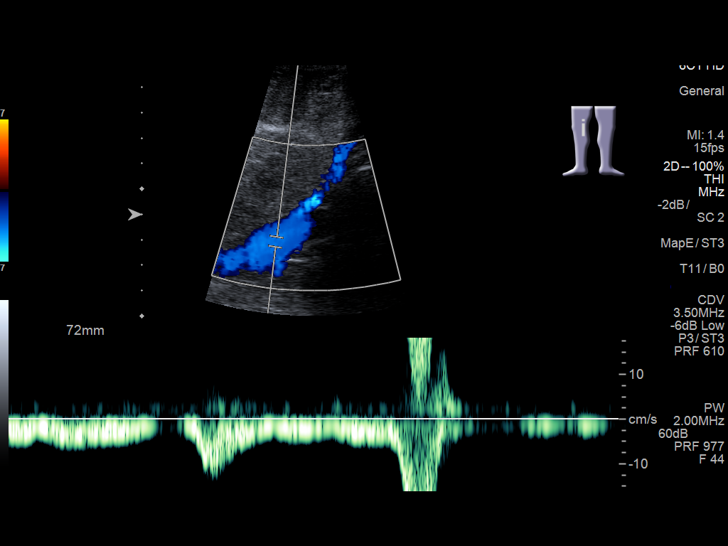
[im 28/34]
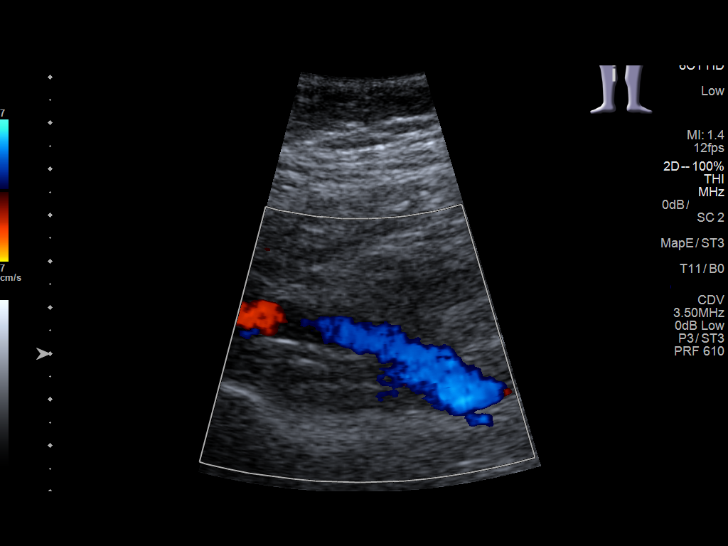
[im 31/34]
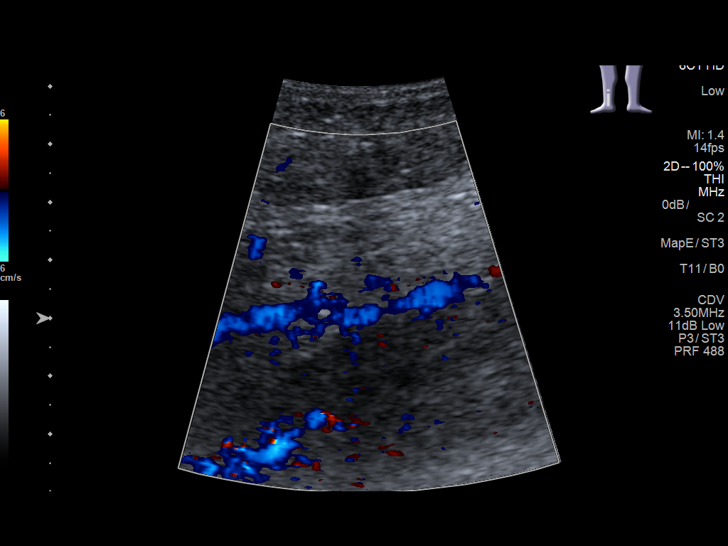
[im 34/34]
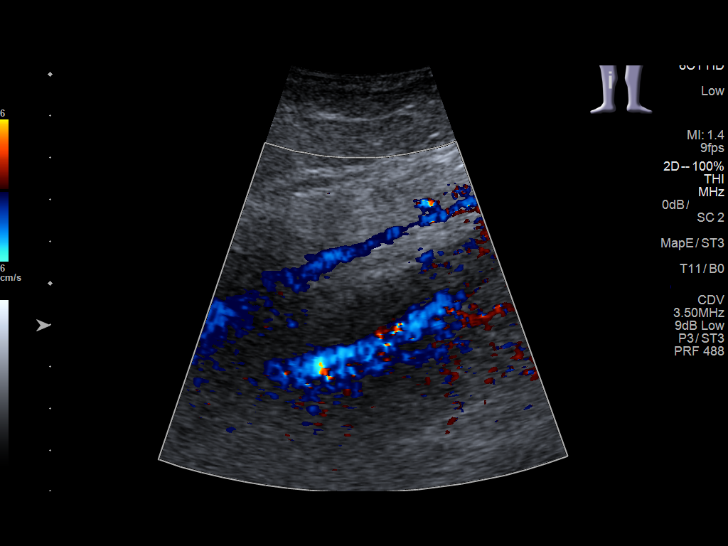

[13 of 24 positions shown; findings below may reference images not displayed]

FINDINGS: Contralateral Common Femoral Vein: Respiratory phasicity is normal
and symmetric with the symptomatic side. No evidence of thrombus.
Normal compressibility.

Common Femoral Vein: No evidence of thrombus. Normal
compressibility, respiratory phasicity and response to augmentation
and valsalva.

Saphenofemoral Junction: No evidence of thrombus. Normal
compressibility and flow on color Doppler imaging.

Profunda Femoral Vein: No evidence of thrombus. Normal
compressibility and flow on color Doppler imaging.

Femoral Vein: No evidence of thrombus. Normal compressibility,
respiratory phasicity and response to augmentation.

Popliteal Vein: No evidence of thrombus. Normal compressibility,
respiratory phasicity and response to augmentation.

Calf Veins: No evidence of thrombus. Normal compressibility and flow
on color Doppler imaging. The right posterior tibial and peroneal
veins were not well visualized due to patient body habitus.

Superficial Great Saphenous Vein: No evidence of thrombus. Normal
compressibility and flow on color Doppler imaging.

Venous Reflux:  None.

Other Findings:  None.
IMPRESSION: No evidence of deep venous thrombosis.

## 2014-12-19 ENCOUNTER — Emergency Department: Payer: 59

## 2014-12-19 ENCOUNTER — Emergency Department
Admission: EM | Admit: 2014-12-19 | Discharge: 2014-12-19 | Disposition: A | Discharge status: Home / Self Care | Payer: 59 | Attending: Emergency Medicine | Admitting: Emergency Medicine

## 2014-12-19 ENCOUNTER — Encounter: Payer: Self-pay | Admitting: Emergency Medicine

## 2014-12-19 DIAGNOSIS — F17210 Nicotine dependence, cigarettes, uncomplicated: Secondary | ICD-10-CM | POA: Insufficient documentation

## 2014-12-19 DIAGNOSIS — R0600 Dyspnea, unspecified: Secondary | ICD-10-CM | POA: Diagnosis not present

## 2014-12-19 DIAGNOSIS — J0100 Acute maxillary sinusitis, unspecified: Secondary | ICD-10-CM

## 2014-12-19 DIAGNOSIS — Z79899 Other long term (current) drug therapy: Secondary | ICD-10-CM | POA: Insufficient documentation

## 2014-12-19 DIAGNOSIS — R0602 Shortness of breath: Secondary | ICD-10-CM | POA: Diagnosis present

## 2014-12-19 DIAGNOSIS — Z3202 Encounter for pregnancy test, result negative: Secondary | ICD-10-CM | POA: Diagnosis not present

## 2014-12-19 DIAGNOSIS — R600 Localized edema: Secondary | ICD-10-CM | POA: Diagnosis not present

## 2014-12-19 LAB — URINALYSIS COMPLETE WITH MICROSCOPIC (ARMC ONLY)
Bilirubin Urine: NEGATIVE
Glucose, UA: NEGATIVE mg/dL
HGB URINE DIPSTICK: NEGATIVE
Ketones, ur: NEGATIVE mg/dL
LEUKOCYTES UA: NEGATIVE
NITRITE: NEGATIVE
PH: 6 (ref 5.0–8.0)
PROTEIN: NEGATIVE mg/dL
Specific Gravity, Urine: 1.013 (ref 1.005–1.030)

## 2014-12-19 LAB — CBC
HEMATOCRIT: 34 % — AB (ref 35.0–47.0)
HEMOGLOBIN: 10.7 g/dL — AB (ref 12.0–16.0)
MCH: 23.9 pg — ABNORMAL LOW (ref 26.0–34.0)
MCHC: 31.4 g/dL — ABNORMAL LOW (ref 32.0–36.0)
MCV: 76 fL — AB (ref 80.0–100.0)
Platelets: 234 10*3/uL (ref 150–440)
RBC: 4.47 MIL/uL (ref 3.80–5.20)
RDW: 15.1 % — ABNORMAL HIGH (ref 11.5–14.5)
WBC: 10 10*3/uL (ref 3.6–11.0)

## 2014-12-19 LAB — BASIC METABOLIC PANEL
ANION GAP: 7 (ref 5–15)
BUN: 11 mg/dL (ref 6–20)
CHLORIDE: 99 mmol/L — AB (ref 101–111)
CO2: 24 mmol/L (ref 22–32)
Calcium: 8.3 mg/dL — ABNORMAL LOW (ref 8.9–10.3)
Creatinine, Ser: 0.65 mg/dL (ref 0.44–1.00)
GFR calc Af Amer: >60 mL/min (ref >60)
Glucose, Bld: 98 mg/dL (ref 65–99)
POTASSIUM: 3.5 mmol/L (ref 3.5–5.1)
SODIUM: 130 mmol/L — AB (ref 135–145)

## 2014-12-19 LAB — POCT PREGNANCY, URINE: Preg Test, Ur: NEGATIVE

## 2014-12-19 LAB — FIBRIN DERIVATIVES D-DIMER (ARMC ONLY): FIBRIN DERIVATIVES D-DIMER (ARMC): 335 (ref 0–499)

## 2014-12-19 LAB — TROPONIN I: TROPONIN I: 0.03 ng/mL (ref <0.031)

## 2014-12-19 LAB — BRAIN NATRIURETIC PEPTIDE: B NATRIURETIC PEPTIDE 5: 6 pg/mL (ref 0.0–100.0)

## 2014-12-19 IMAGING — CR DG CHEST 2V
1 series · 2 of 2 positions shown · non-contrast
Comparison: [DATE]

CLINICAL DATA: Leg swelling.  Short of breath.

EXAM:
CHEST  2 VIEW

[Series 1: w chest pa · 0.14mm/px · 2 of 2 slices shown]
[im 1/2]
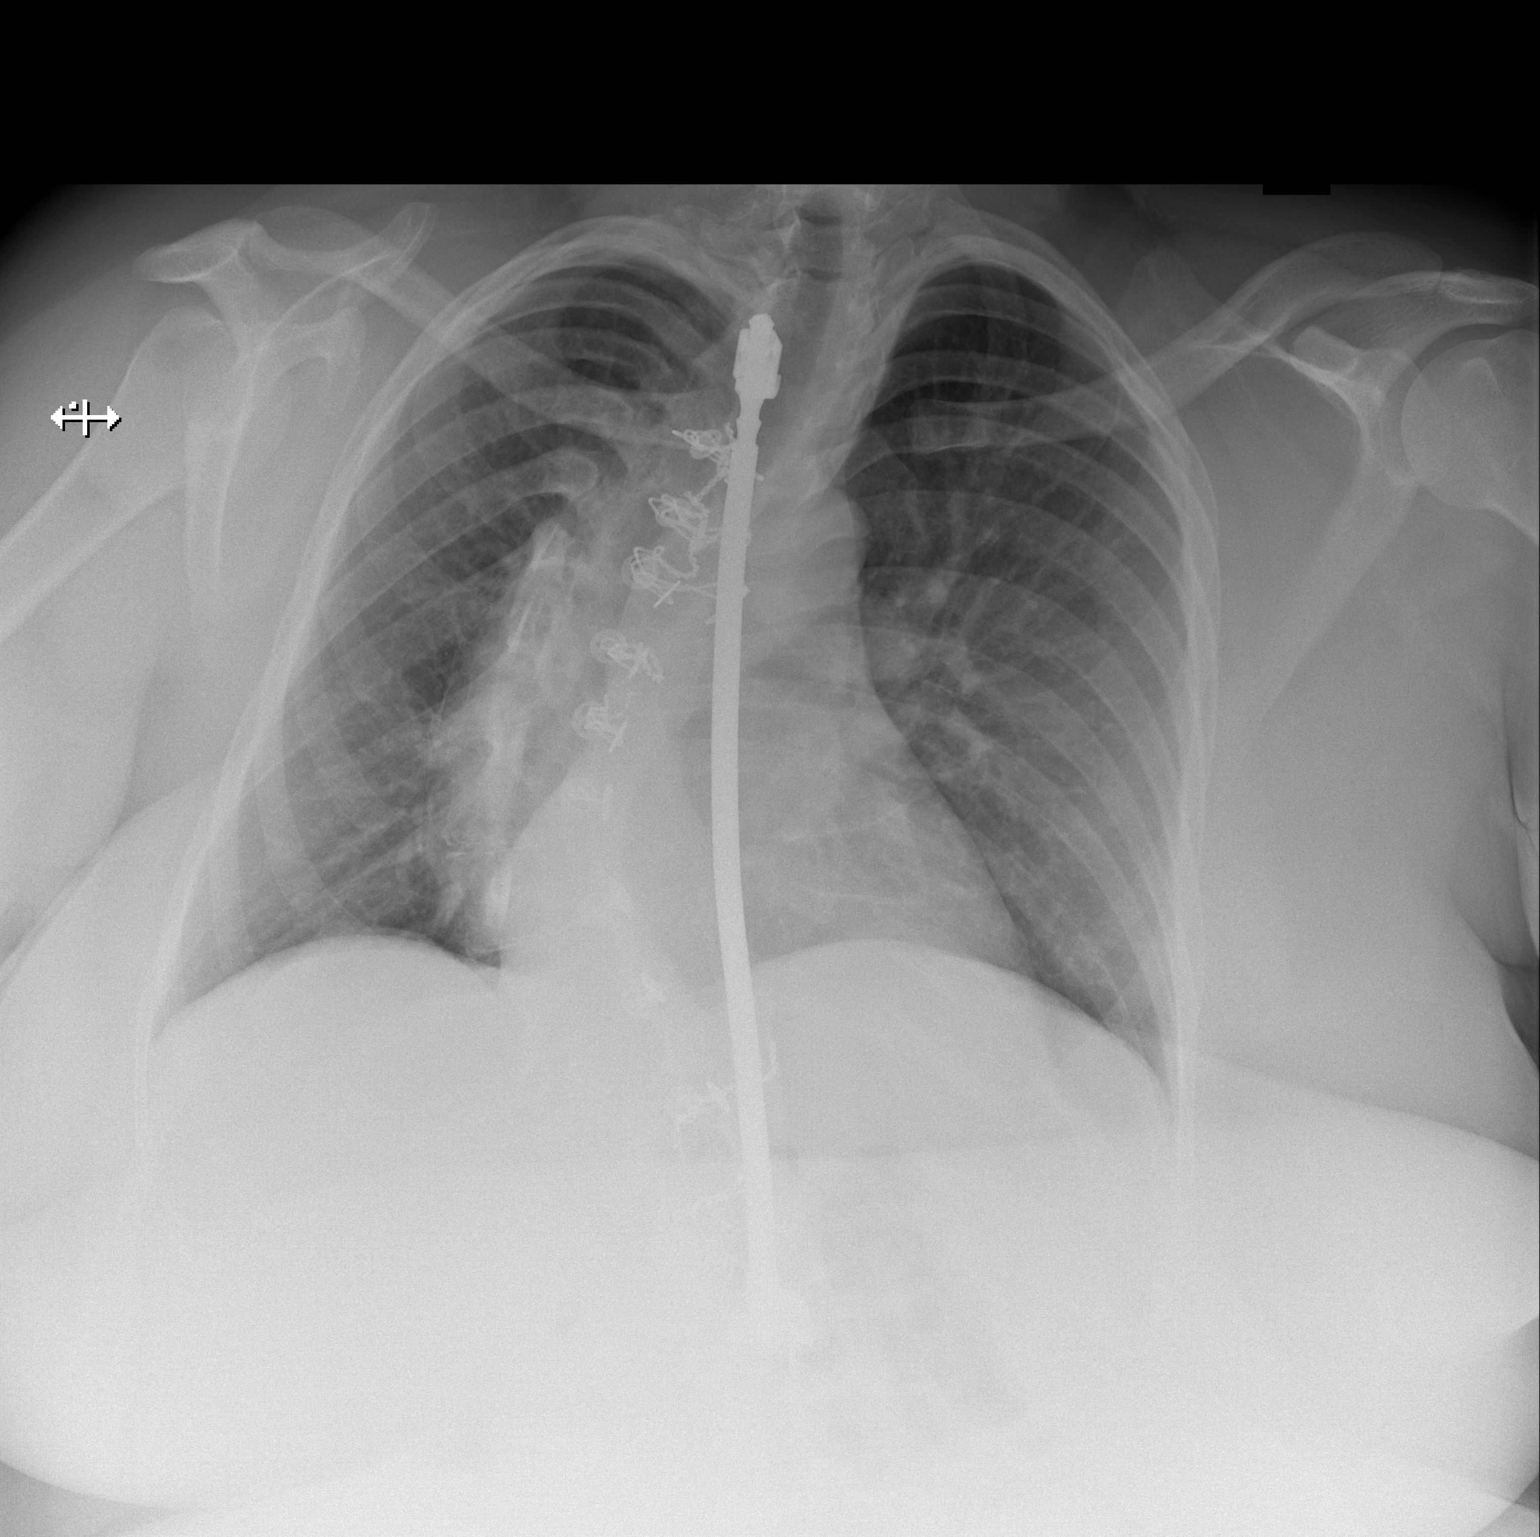
[im 2/2]
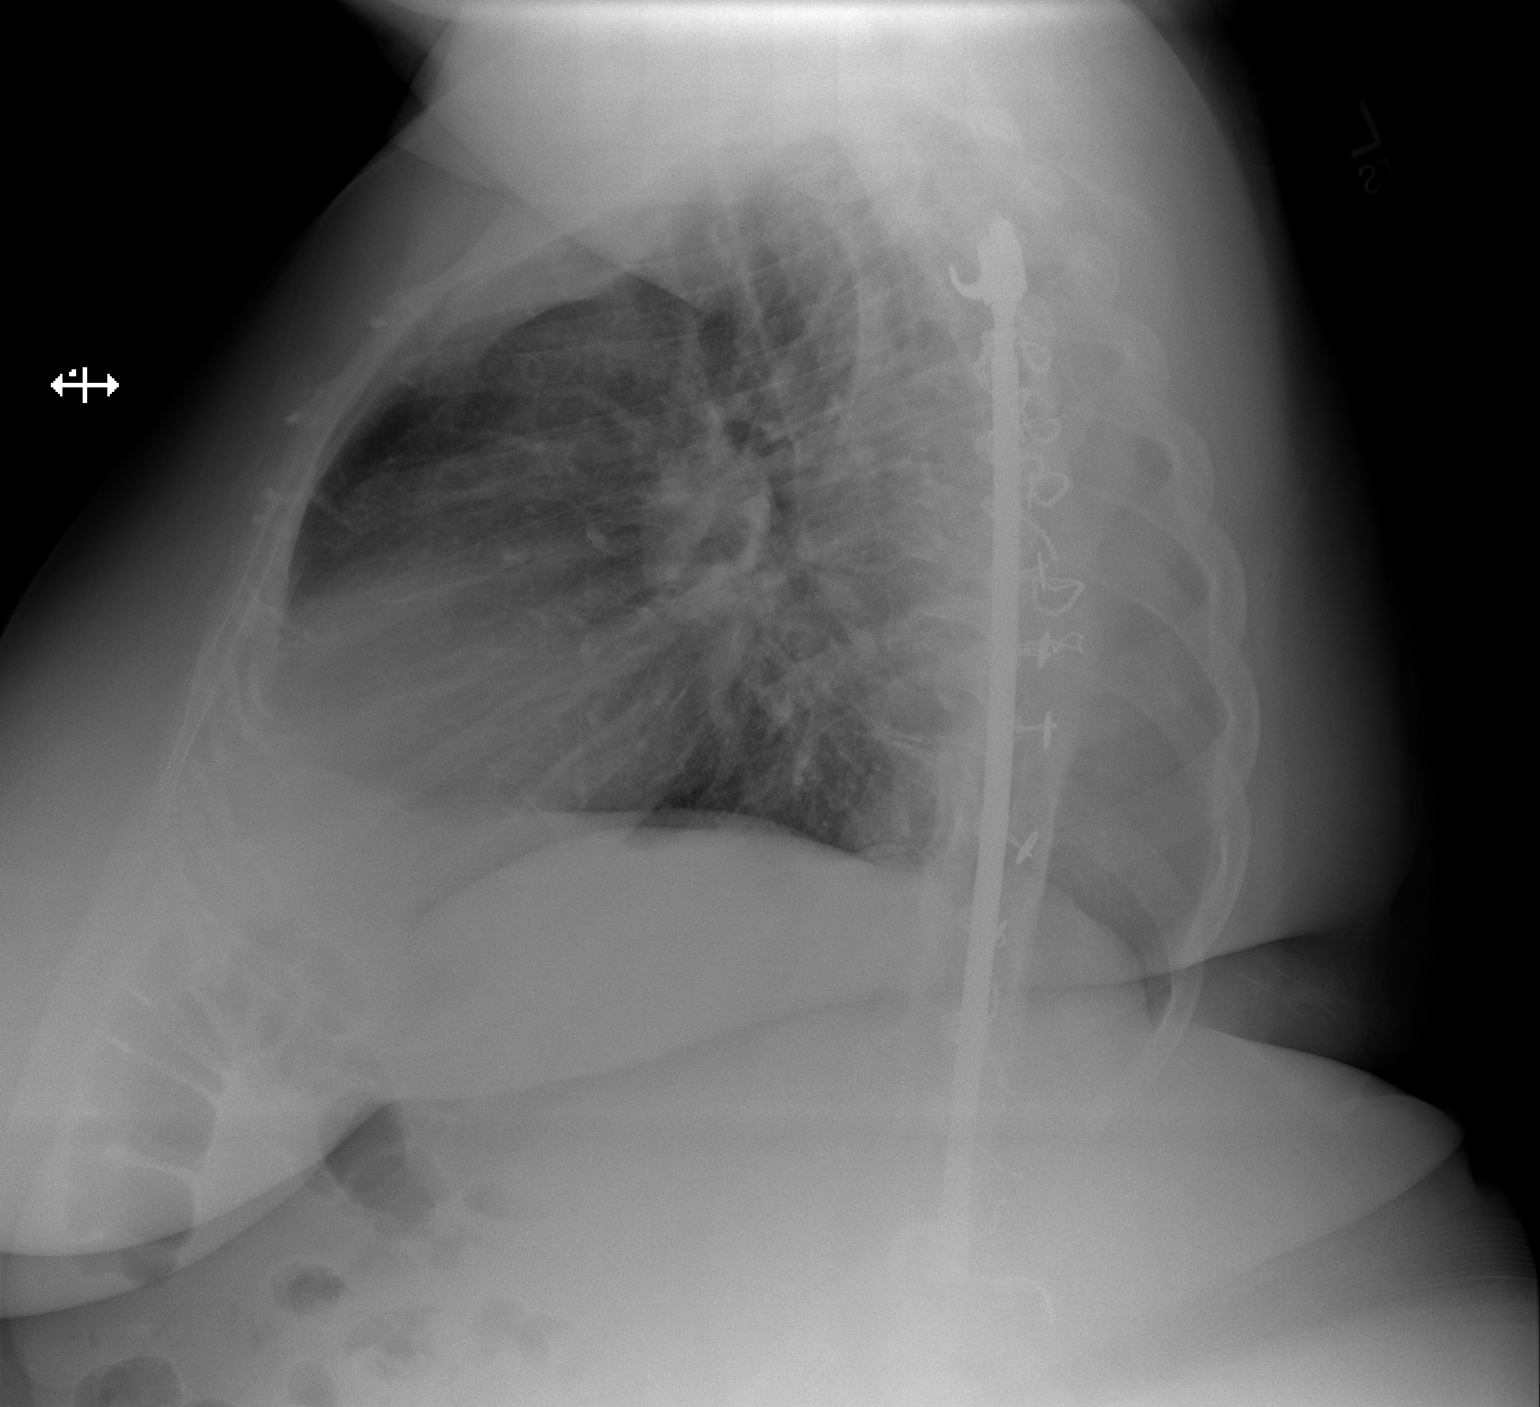

[2 of 2 positions shown; findings below may reference images not displayed]

FINDINGS: Mild cardiomegaly. Severe scoliosis with a single Harrington rod.
Lungs are grossly clear. No pneumothorax.
IMPRESSION: No active cardiopulmonary disease.

## 2014-12-19 MED ORDER — AMOXICILLIN-POT CLAVULANATE 875-125 MG PO TABS: 1.0000 | ORAL_TABLET | Freq: Two times a day (BID) | ORAL | Status: DC

## 2014-12-19 MED ORDER — AMOXICILLIN-POT CLAVULANATE 875-125 MG PO TABS
ORAL_TABLET | ORAL | Status: AC
  Filled 2014-12-19: qty 1

## 2014-12-19 MED ORDER — AMOXICILLIN-POT CLAVULANATE 875-125 MG PO TABS
1.0000 | ORAL_TABLET | Freq: Once | ORAL | Status: AC
  Administered 2014-12-19: 1 via ORAL

## 2014-12-19 NOTE — Discharge Instructions (Signed)

## 2014-12-19 NOTE — ED Notes (Signed)
BLE edema noted

## 2014-12-19 NOTE — ED Notes (Signed)
Pt reports sob and swelling in ankles and feet.  Sx began yesterday.  No chest pain.  cig smoker.  No cough.  No fever.  Pt alert.  Skin warm and dry.

## 2014-12-19 NOTE — ED Provider Notes (Signed)
[facility] Emergency Department Provider Note  ____________________________________________  Time seen: 8:10 PM  I have reviewed the triage vital signs and the nursing notes.   HISTORY  Chief Complaint Shortness of Breath and Dizziness      HPI Tracey White is a [age] y.o. female presents with intermittent dizziness since 10 AM today. Patient also admits to dyspnea and congestion for the past 2 weeks. Patient states dizziness is worse with head movement or change of position. In addition patient admits to bilateral ankle and feet swelling since yesterday.     Past Medical History  Diagnosis Date  . Scoliosis     There are no active problems to display for this patient.   Past Surgical History  Procedure Laterality Date  . Tubal ligation    . Hernia repair    . Scoliosis repair      Current Outpatient Rx  Name  Route  Sig  Dispense  Refill  . cyclobenzaprine (FLEXERIL) 10 MG tablet   Oral   Take 1 tablet (10 mg total) by mouth 3 (three) times daily as needed for muscle spasms.   15 tablet   1   . naproxen (NAPROSYN) 500 MG tablet   Oral   Take 1 tablet (500 mg total) by mouth 2 (two) times daily as needed for mild pain or moderate pain.   10 tablet   0   . sertraline (ZOLOFT) 100 MG tablet   Oral   Take 100 mg by mouth daily.           Allergies Aspirin  No family history on file.  Social History Social History  Substance Use Topics  . Smoking status: Current Every Day Smoker -- 0.50 packs/day    Types: Cigarettes  . Smokeless tobacco: Never Used  . Alcohol Use: No    Review of Systems  Constitutional: Negative for fever. Eyes: Negative for visual changes. ENT: Negative for sore throat.Positive congestion Cardiovascular: Negative for chest pain. Respiratory: positive dyspnea Gastrointestinal: Negative for abdominal pain, vomiting and diarrhea. Genitourinary: Negative for dysuria. Musculoskeletal: Negative for back  pain. Skin: Negative for rash. Neurological: Negative for headaches, focal weakness or numbness.   10-point ROS otherwise negative.  ____________________________________________   PHYSICAL EXAM:  VITAL SIGNS: ED Triage Vitals  Enc Vitals Group     BP 12/19/14 1811 141/80 mmHg     Pulse Rate 12/19/14 1811 88     Resp 12/19/14 1811 18     Temp 12/19/14 1811 97.7 F (36.5 C)     Temp Source 12/19/14 1811 Oral     SpO2 12/19/14 1811 98 %     Weight 12/19/14 1811 303 lb (137.44 kg)     Height 12/19/14 1811 5\' 1"  (1.549 m)     Head Cir --      Peak Flow --      Pain Score --      Pain Loc --      Pain Edu? --      Excl. in GC? --      Constitutional: Alert and oriented. Well appearing and in no distress. Eyes: Conjunctivae are normal. PERRL. Normal extraocular movements. ENT   Head: Normocephalic and atraumatic.   Nose: No congestion/rhinnorhea.   Mouth/Throat: Mucous membranes are moist.   Neck: No stridor. Ears: Clear fluid noted posterior to bilateral TMs Hematological/Lymphatic/Immunilogical: No cervical lymphadenopathy. Cardiovascular: Normal rate, regular rhythm. Normal and symmetric distal pulses are present in all extremities. No murmurs, rubs, or  gallops. Respiratory: Normal respiratory effort without tachypnea nor retractions. Breath sounds are clear and equal bilaterally. No wheezes/rales/rhonchi. Gastrointestinal: Soft and nontender. No distention. There is no CVA tenderness. Genitourinary: deferred Musculoskeletal: Nontender with normal range of motion in all extremities. No joint effusions.  No lower extremity tenderness. Trace pretibial edema Neurologic:  Normal speech and language. No gross focal neurologic deficits are appreciated. Speech is normal.  Skin:  Skin is warm, dry and intact. No rash noted. Psychiatric: Mood and affect are normal. Speech and behavior are normal. Patient exhibits appropriate insight and  judgment.  ____________________________________________    LABS (pertinent positives/negatives)  Labs Reviewed  BASIC METABOLIC PANEL - Abnormal; Notable for the following:    Sodium 130 (*)    Chloride 99 (*)    Calcium 8.3 (*)    All other components within normal limits  CBC - Abnormal; Notable for the following:    Hemoglobin 10.7 (*)    HCT 34.0 (*)    MCV 76.0 (*)    MCH 23.9 (*)    MCHC 31.4 (*)    RDW 15.1 (*)    All other components within normal limits  URINALYSIS COMPLETEWITH MICROSCOPIC ([facility] ONLY) - Abnormal; Notable for the following:    Color, Urine YELLOW (*)    APPearance HAZY (*)    Bacteria, UA RARE (*)    Squamous Epithelial / LPF 6-30 (*)    All other components within normal limits  TROPONIN I  BRAIN NATRIURETIC PEPTIDE  FIBRIN DERIVATIVES D-DIMER ([facility] ONLY)  CBG MONITORING, ED  POC URINE PREG, ED  POCT PREGNANCY, URINE     ____________________________________________   EKG  ED ECG REPORT I, BROWN, [place] N, the attending physician, personally viewed and interpreted this ECG.   Date: 12/20/2014  EKG Time: 6:22 PM  Rate: 82  Rhythm: Normal sinus rhythm  Axis: None  Intervals: Normal   ST&T Change: None   RADIOLOGY DG Chest 2 View (Final result) Result time: 12/19/14 18:58:14   Final result by Rad Results In Interface (12/19/14 18:58:14)   Narrative:   CLINICAL DATA: Leg swelling. Short of breath.  EXAM: CHEST 2 VIEW  COMPARISON: 10/10/2014  FINDINGS: Mild cardiomegaly. Severe scoliosis with a single Harrington rod. Lungs are grossly clear. No pneumothorax.  IMPRESSION: No active cardiopulmonary disease.   Electronically Signed By: Jolaine Click M.D. On: 12/19/2014 18:58      INITIAL IMPRESSION / ASSESSMENT AND PLAN / ED COURSE  Pertinent labs & imaging results that were available during my care of the patient were reviewed by me and considered in my medical decision making (see chart for  details).  Given history and physical exam concern for possible DVT or PE hence d-dimer performed which was negative. Patient's symptoms consistent with possible sinusitis given pain and palpation of the maxillary sinuses nasal congestion and clear fluid noted behind bilateral. TMs  ____________________________________________   FINAL CLINICAL IMPRESSION(S) / ED DIAGNOSES  Final diagnoses:  Acute maxillary sinusitis, recurrence not specified      Darci Current, MD 12/20/14 0401

## 2014-12-19 NOTE — ED Notes (Signed)
Pt states she has been feeling dizzy off and on since around 10am, states she is also sob at times as well. Pt appears in no distress, states she has had congestion over the past few weeks as well.

## 2015-05-25 IMAGING — US US EXTREM LOW VENOUS*R*
1 series · 13 of 24 positions shown · non-contrast
Comparison: [DATE]

CLINICAL DATA: Right lower extremity pain and edema



[Series 1: us extrem low venous*right* · 0.08mm/px · 13 of 32 slices shown]
[im 1/32]
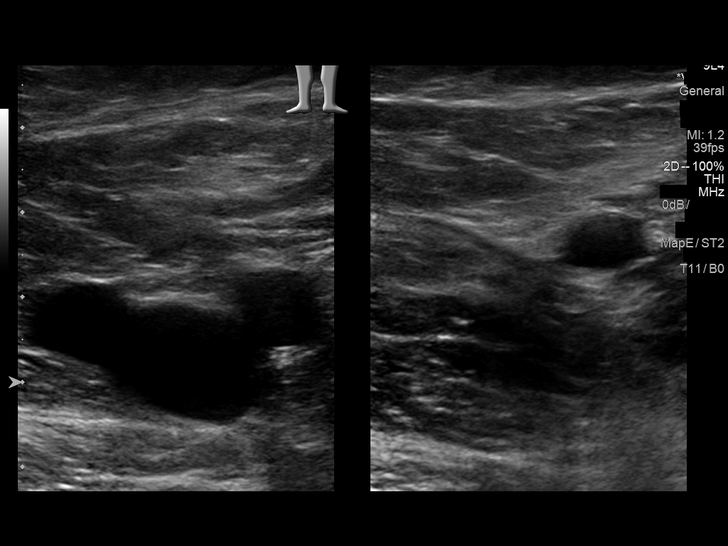
[im 3/32]
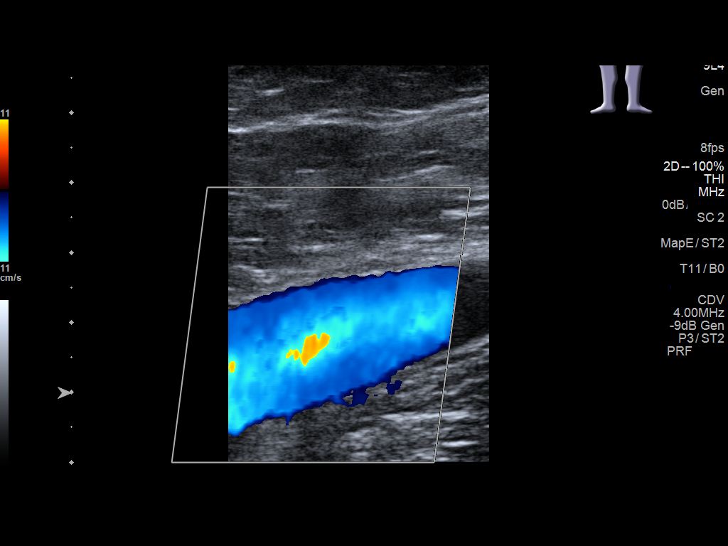
[im 6/32]
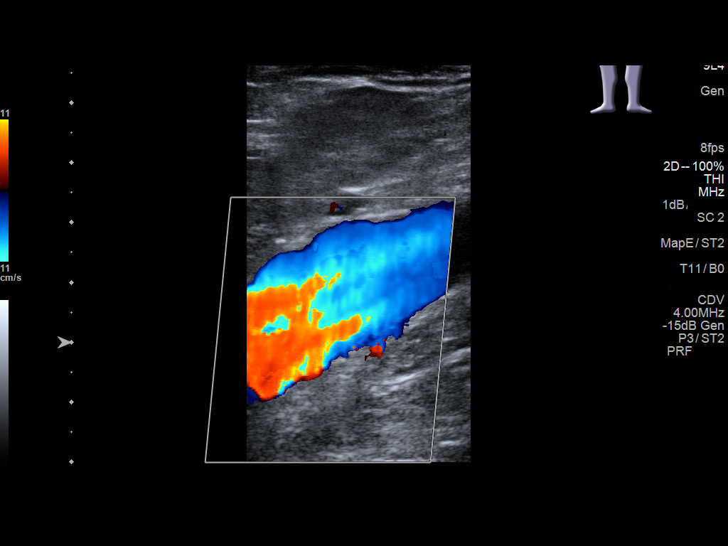
[im 9/32]
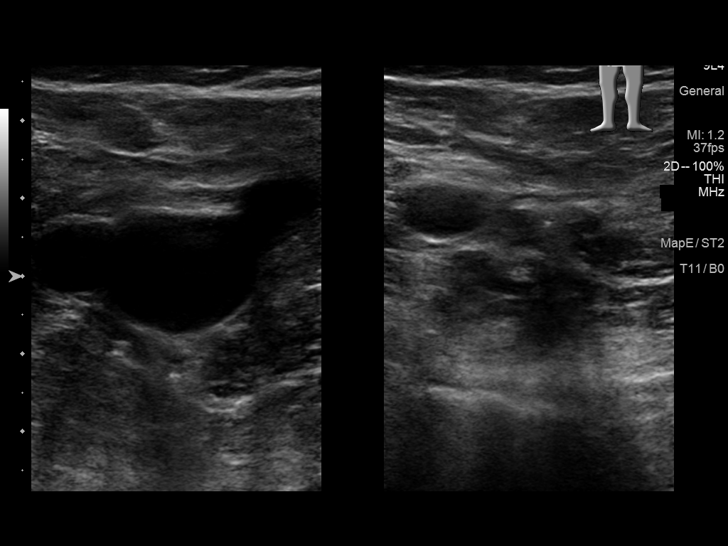
[im 11/32]
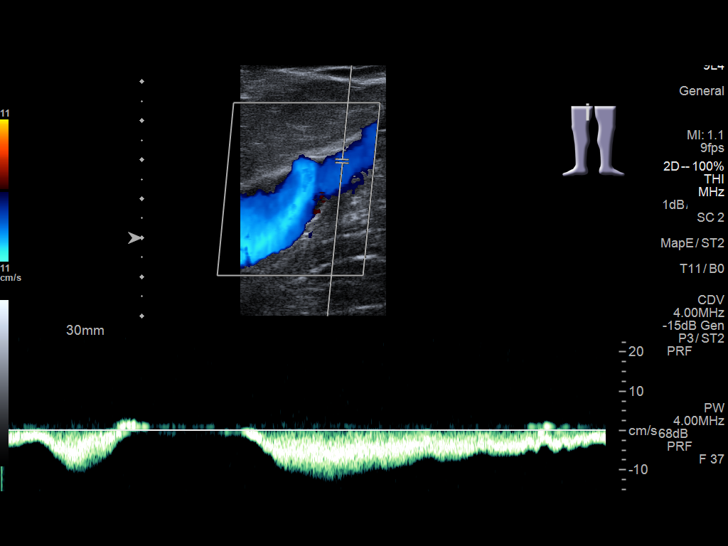
[im 14/32]
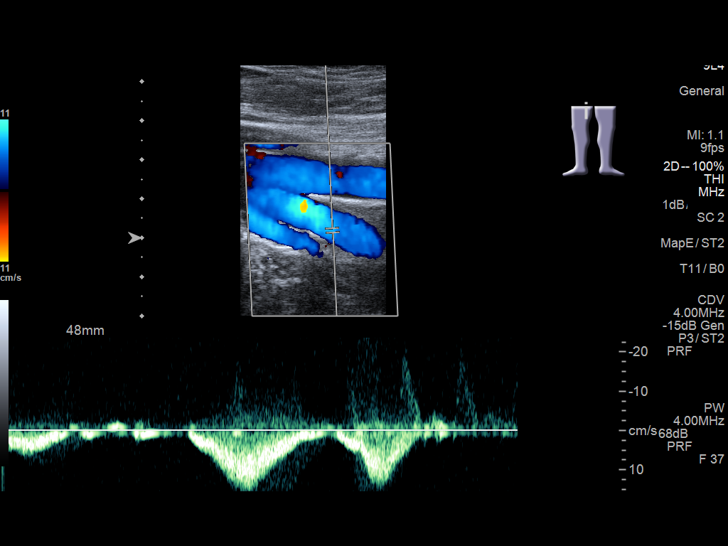
[im 17/32]
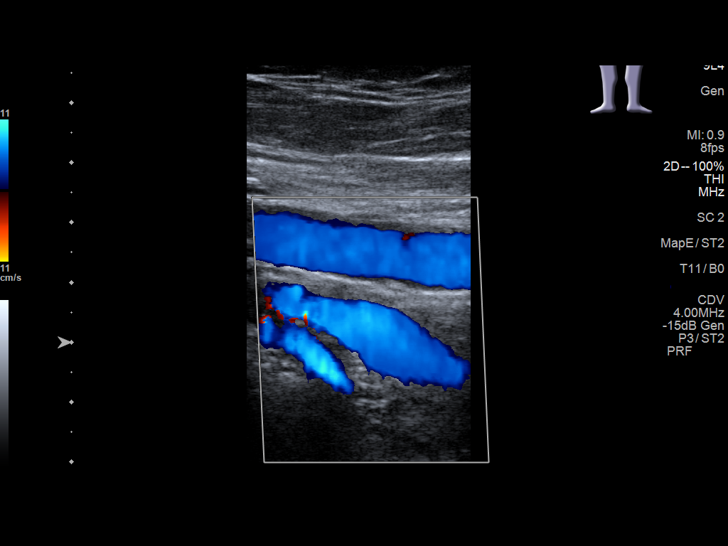
[im 18/32]
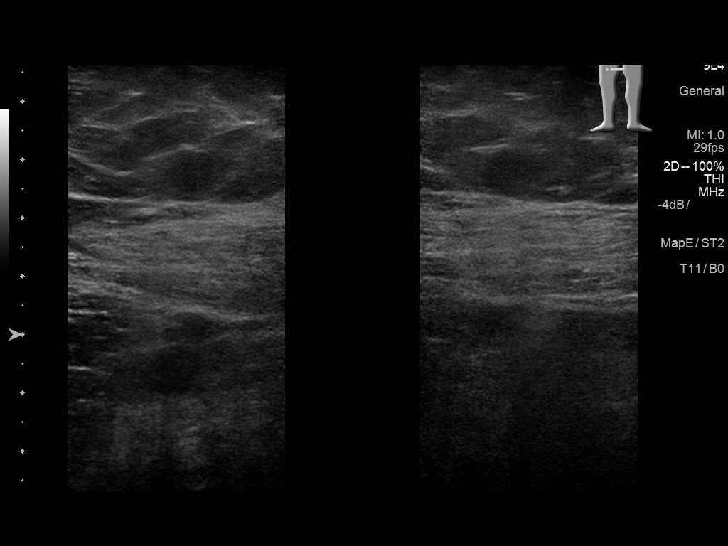
[im 21/32]
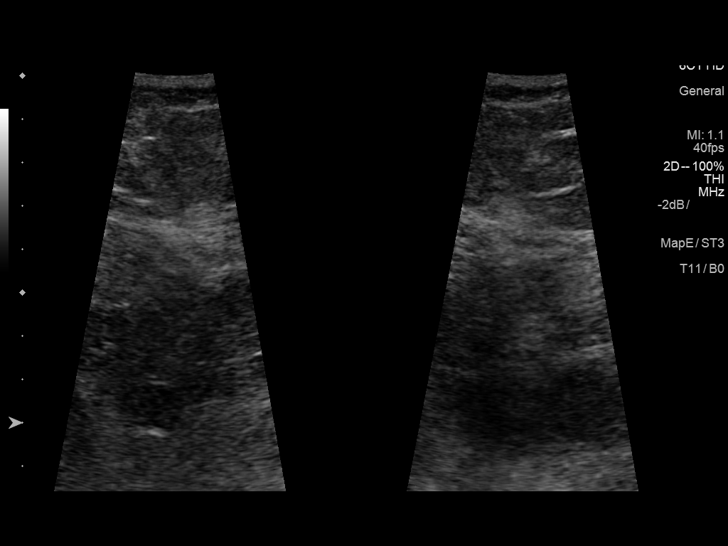
[im 23/32]
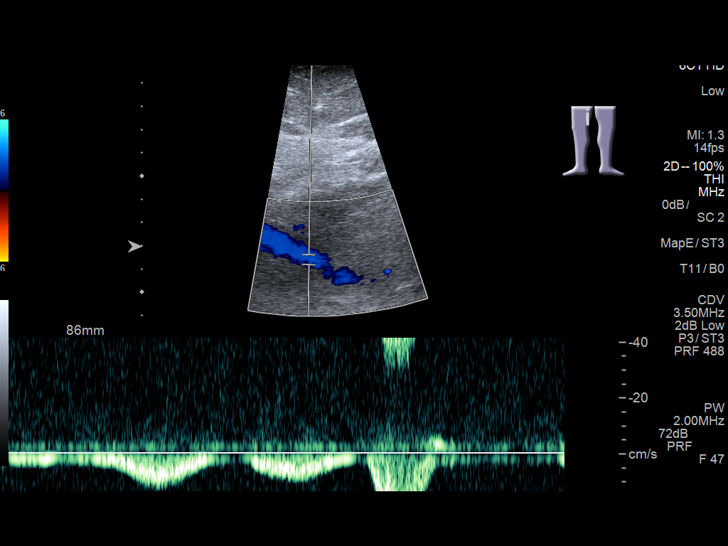
[im 26/32]
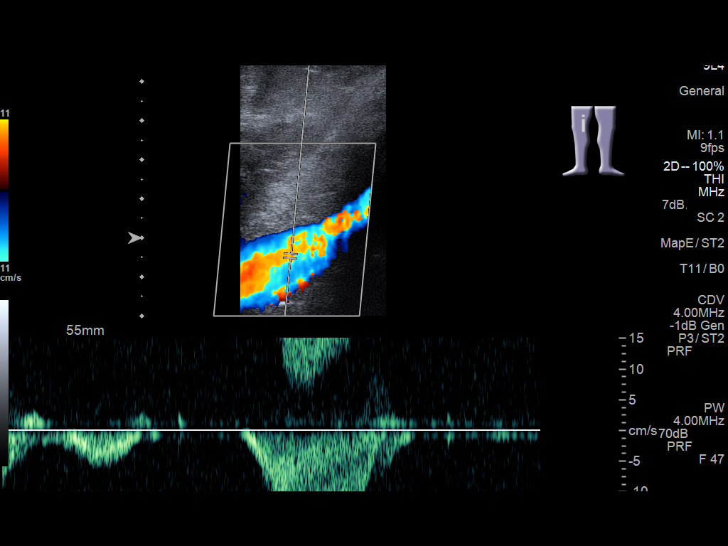
[im 29/32]
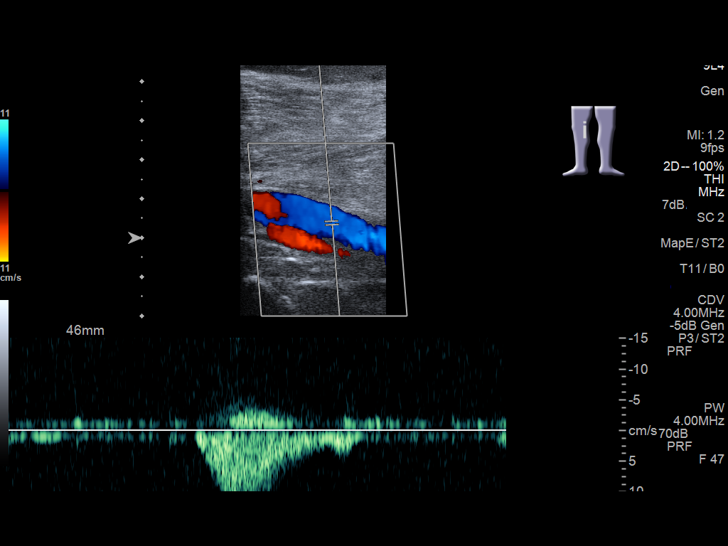
[im 32/32]
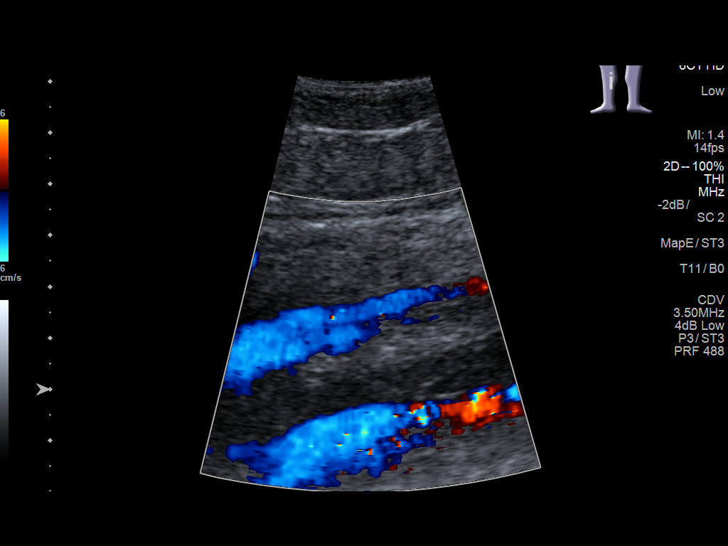

[13 of 24 positions shown; findings below may reference images not displayed]

FINDINGS: Contralateral Common Femoral Vein: Respiratory phasicity is normal
and symmetric with the symptomatic side. No evidence of thrombus.
Normal compressibility.

Common Femoral Vein: No evidence of thrombus. Normal
compressibility, respiratory phasicity and response to augmentation.

Saphenofemoral Junction: No evidence of thrombus. Normal
compressibility and flow on color Doppler imaging.

Profunda Femoral Vein: No evidence of thrombus. Normal
compressibility and flow on color Doppler imaging.

Femoral Vein: No evidence of thrombus. Normal compressibility,
respiratory phasicity and response to augmentation.

Popliteal Vein: No evidence of thrombus. Normal compressibility,
respiratory phasicity and response to augmentation.

Calf Veins: No evidence of thrombus. Normal compressibility and flow
on color Doppler imaging.

Other Findings:  None.
IMPRESSION: No evidence of DVT within the right lower extremity.

## 2015-06-08 IMAGING — US US EXTREM LOW VENOUS*R*
1 series · 13 of 24 positions shown · non-contrast
Comparison: Prior ultrasound from [DATE].

CLINICAL DATA: Initial evaluation for acute right lower extremity
edema, pain.



[Series 1: us extrem low venous*right* · 0.07mm/px · 13 of 43 slices shown]
[im 1/43]
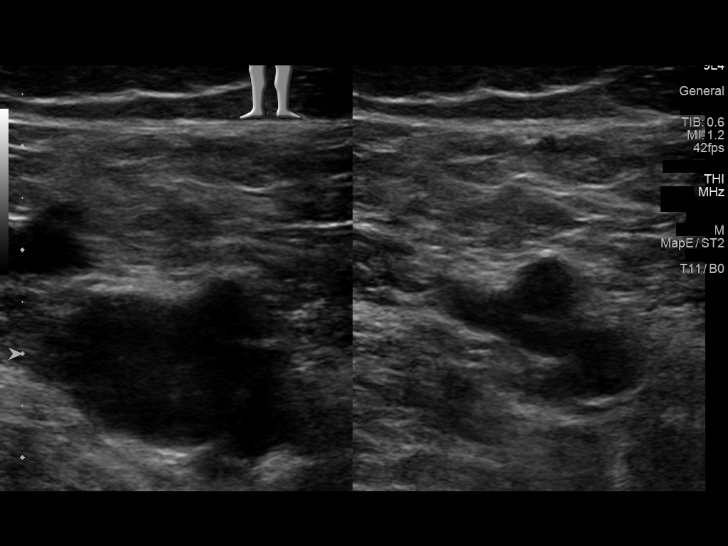
[im 4/43]
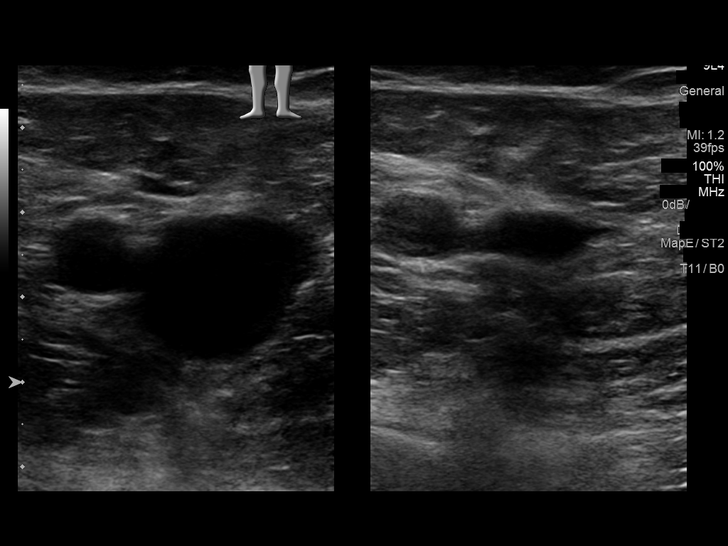
[im 8/43]
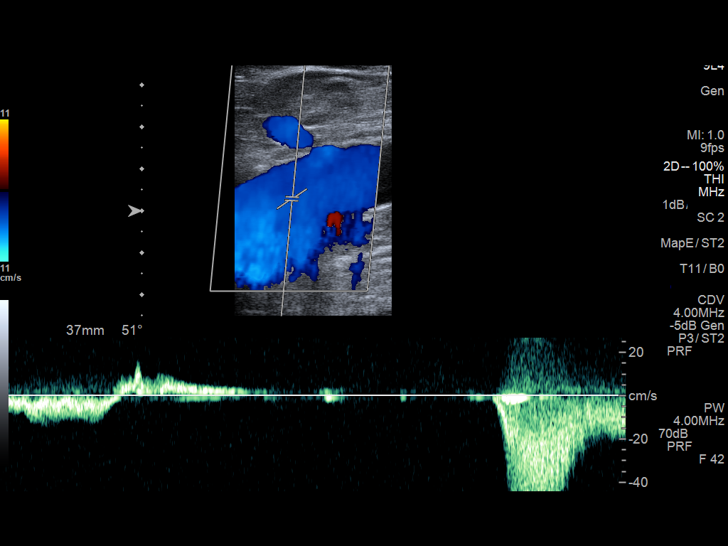
[im 11/43]
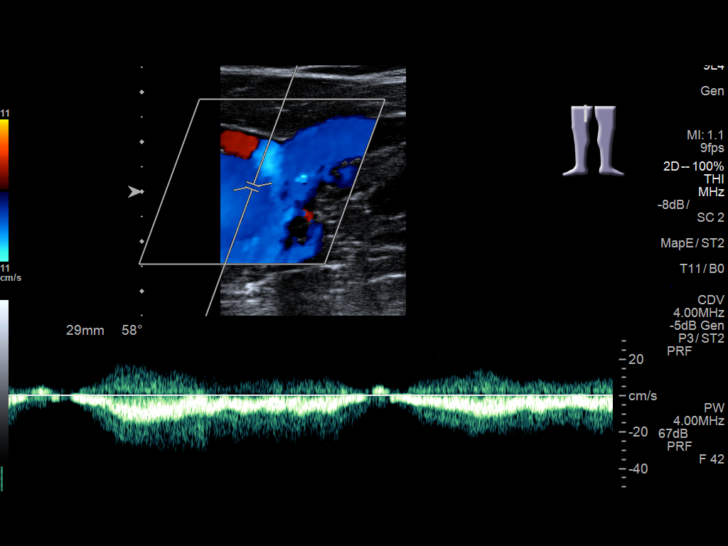
[im 15/43]
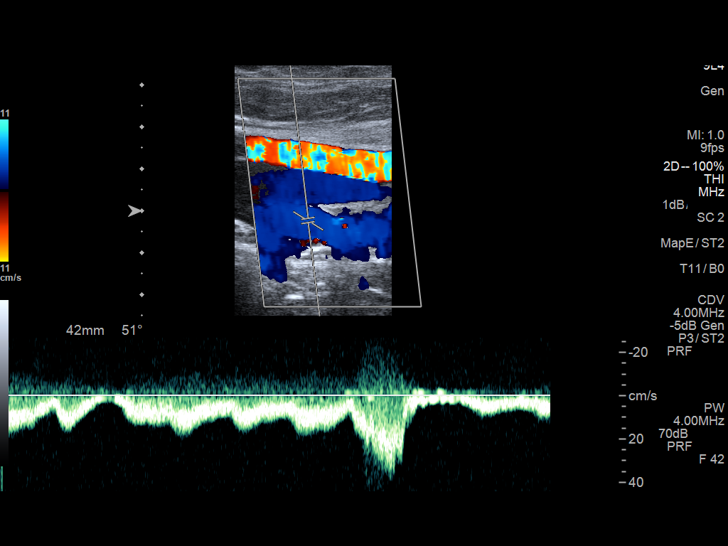
[im 19/43]
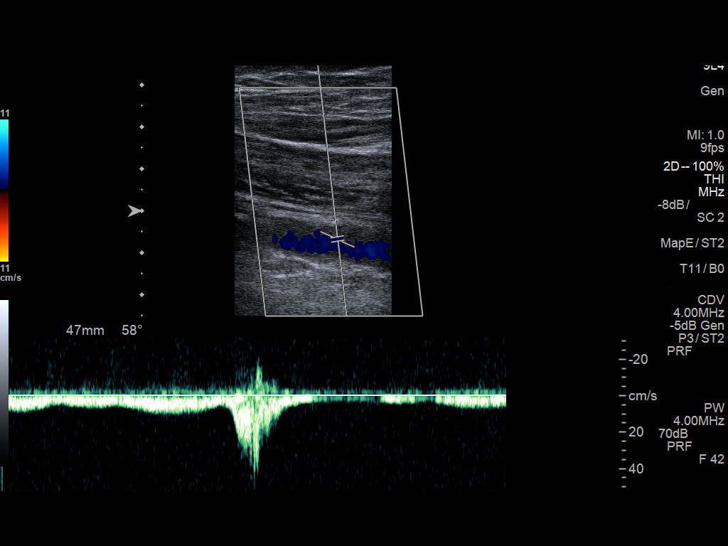
[im 22/43]
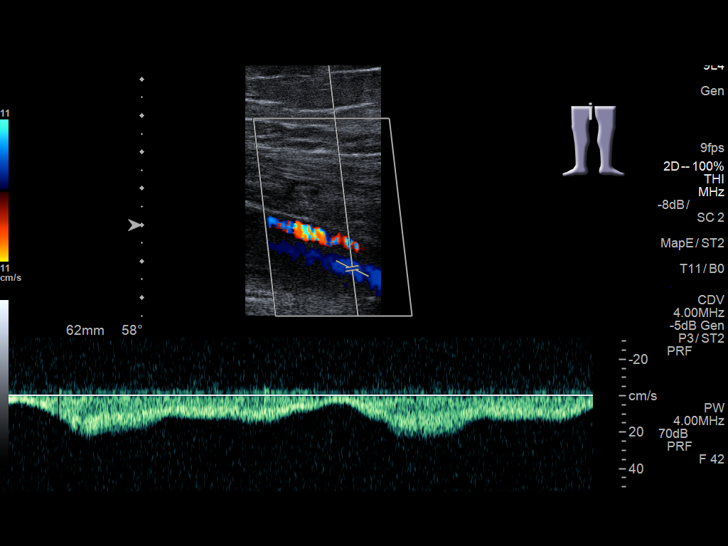
[im 24/43]
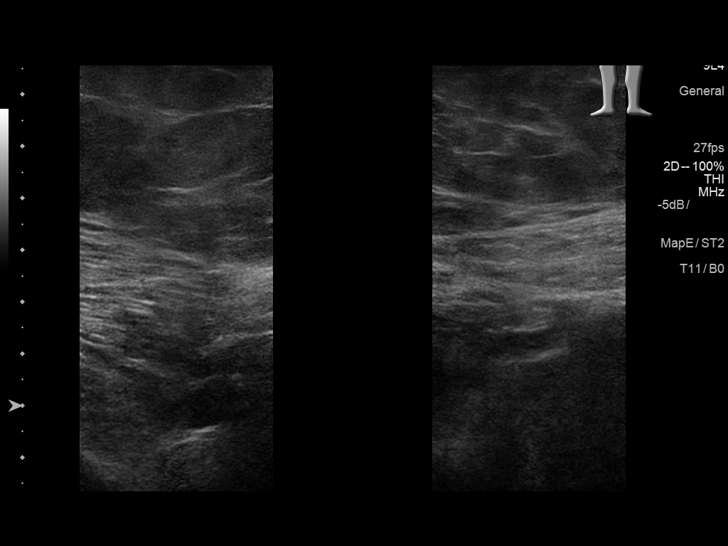
[im 28/43]
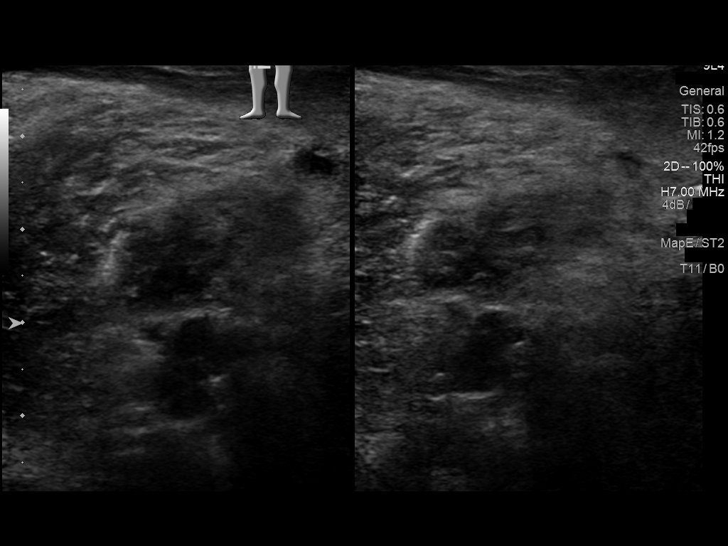
[im 32/43]
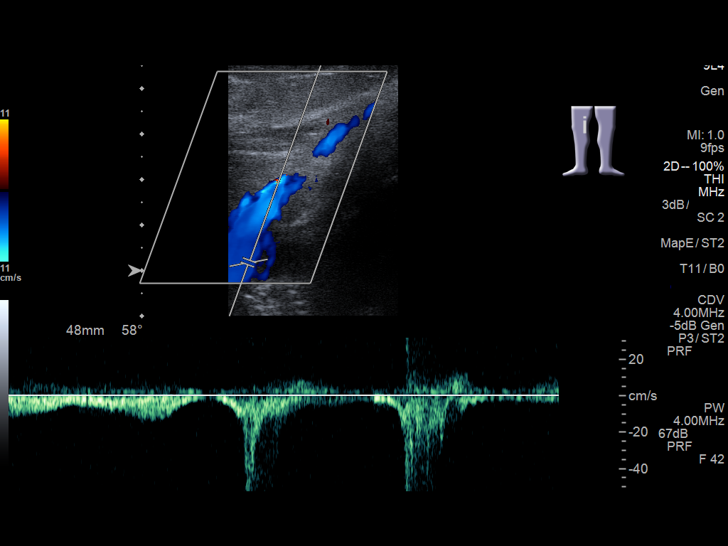
[im 35/43]
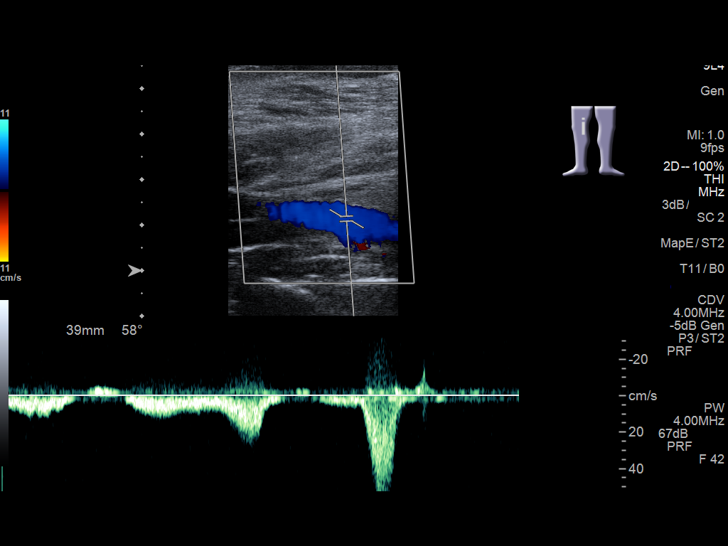
[im 39/43]
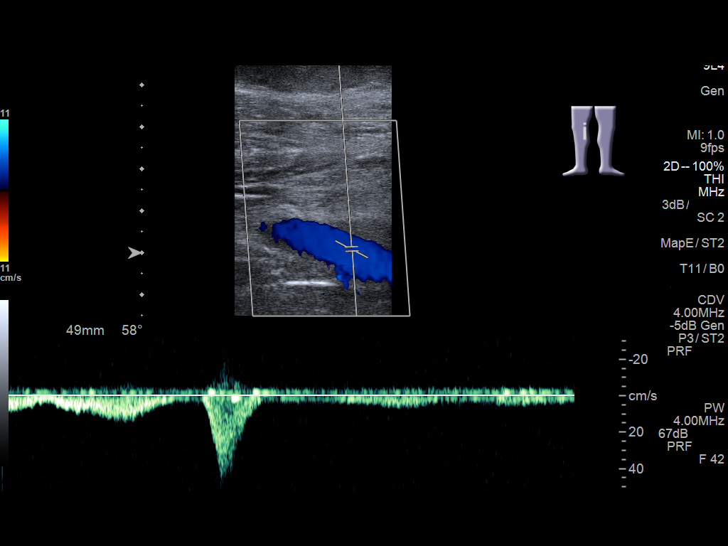
[im 43/43]
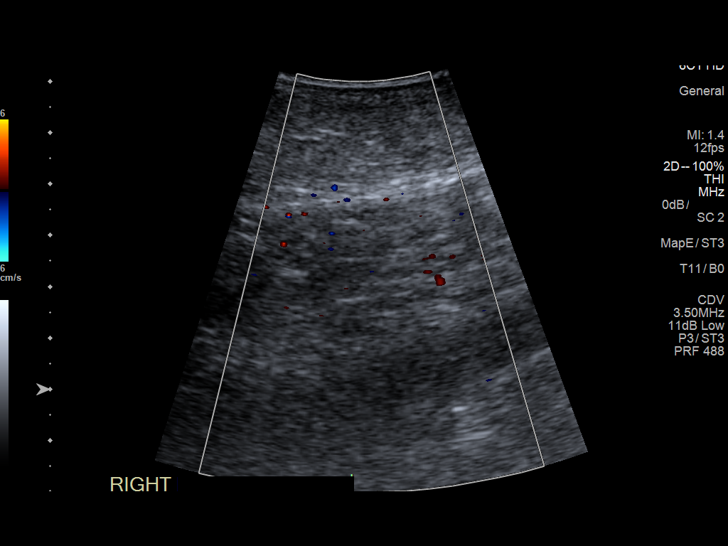

[13 of 24 positions shown; findings below may reference images not displayed]

FINDINGS: Contralateral Common Femoral Vein: Respiratory phasicity is normal
and symmetric with the symptomatic side. No evidence of thrombus.
Normal compressibility.

Common Femoral Vein: No evidence of thrombus. Normal
compressibility, respiratory phasicity and response to augmentation.

Saphenofemoral Junction: No evidence of thrombus. Normal
compressibility and flow on color Doppler imaging.

Profunda Femoral Vein: No evidence of thrombus. Normal
compressibility and flow on color Doppler imaging.

Femoral Vein: No evidence of thrombus. Normal compressibility,
respiratory phasicity and response to augmentation.

Popliteal Vein: No evidence of thrombus. Normal compressibility,
respiratory phasicity and response to augmentation.

Calf Veins: No evidence of thrombus. Normal compressibility and flow
on color Doppler imaging. Peroneal vein not visualized.

Superficial Great Saphenous Vein: No evidence of thrombus. Normal
compressibility and flow on color Doppler imaging.

Venous Reflux:  None.

Other Findings:  None.
IMPRESSION: No evidence of DVT within the right lower extremity.

## 2015-07-15 ENCOUNTER — Encounter: Payer: Self-pay | Admitting: *Deleted

## 2015-07-15 ENCOUNTER — Emergency Department: Payer: Worker's Compensation

## 2015-07-15 DIAGNOSIS — Y929 Unspecified place or not applicable: Secondary | ICD-10-CM | POA: Insufficient documentation

## 2015-07-15 DIAGNOSIS — X509XXA Other and unspecified overexertion or strenuous movements or postures, initial encounter: Secondary | ICD-10-CM | POA: Diagnosis not present

## 2015-07-15 DIAGNOSIS — F17210 Nicotine dependence, cigarettes, uncomplicated: Secondary | ICD-10-CM | POA: Diagnosis not present

## 2015-07-15 DIAGNOSIS — M25532 Pain in left wrist: Secondary | ICD-10-CM | POA: Diagnosis present

## 2015-07-15 DIAGNOSIS — Y999 Unspecified external cause status: Secondary | ICD-10-CM | POA: Insufficient documentation

## 2015-07-15 DIAGNOSIS — Y939 Activity, unspecified: Secondary | ICD-10-CM | POA: Insufficient documentation

## 2015-07-15 DIAGNOSIS — S66912A Strain of unspecified muscle, fascia and tendon at wrist and hand level, left hand, initial encounter: Secondary | ICD-10-CM | POA: Diagnosis not present

## 2015-07-15 IMAGING — CR DG WRIST COMPLETE 3+V*L*
4 series · 4 of 4 positions shown · non-contrast
Comparison: None.

CLINICAL DATA: Pain in the left wrist joint after injury.

EXAM:
LEFT WRIST - COMPLETE 3+ VIEW

[wrist pa]
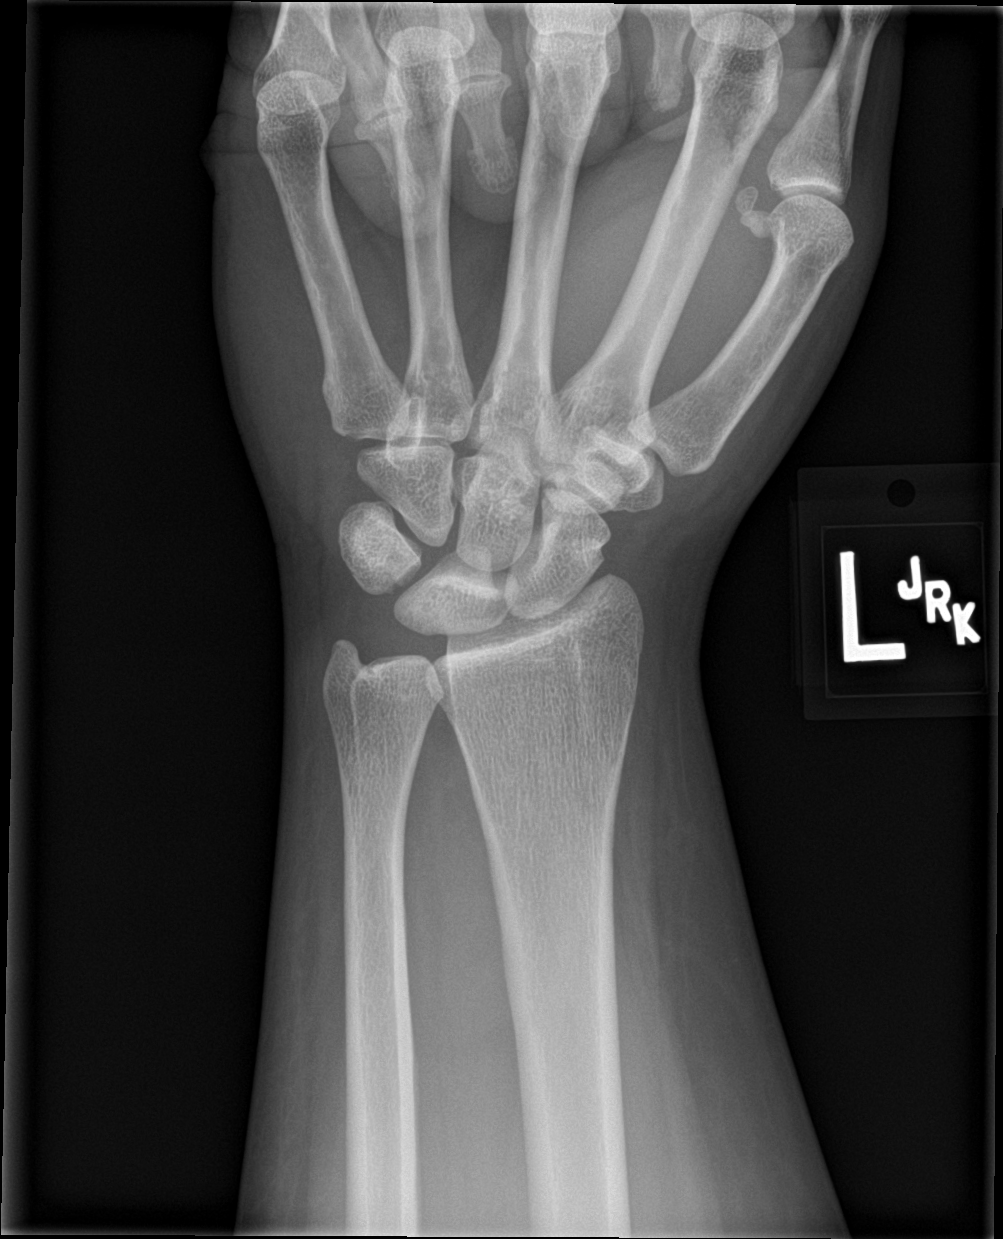

[wrist obl]
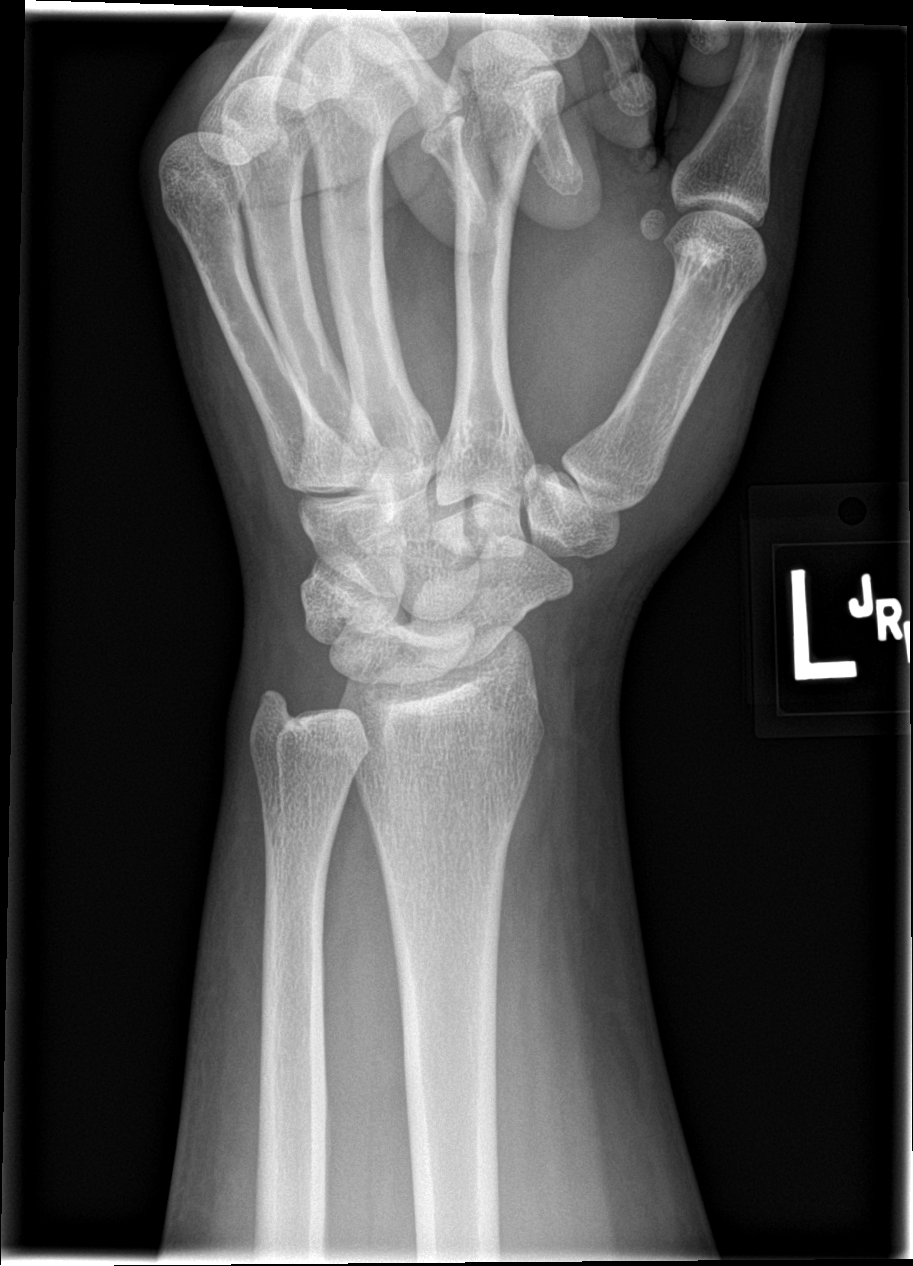

[wrist lat]
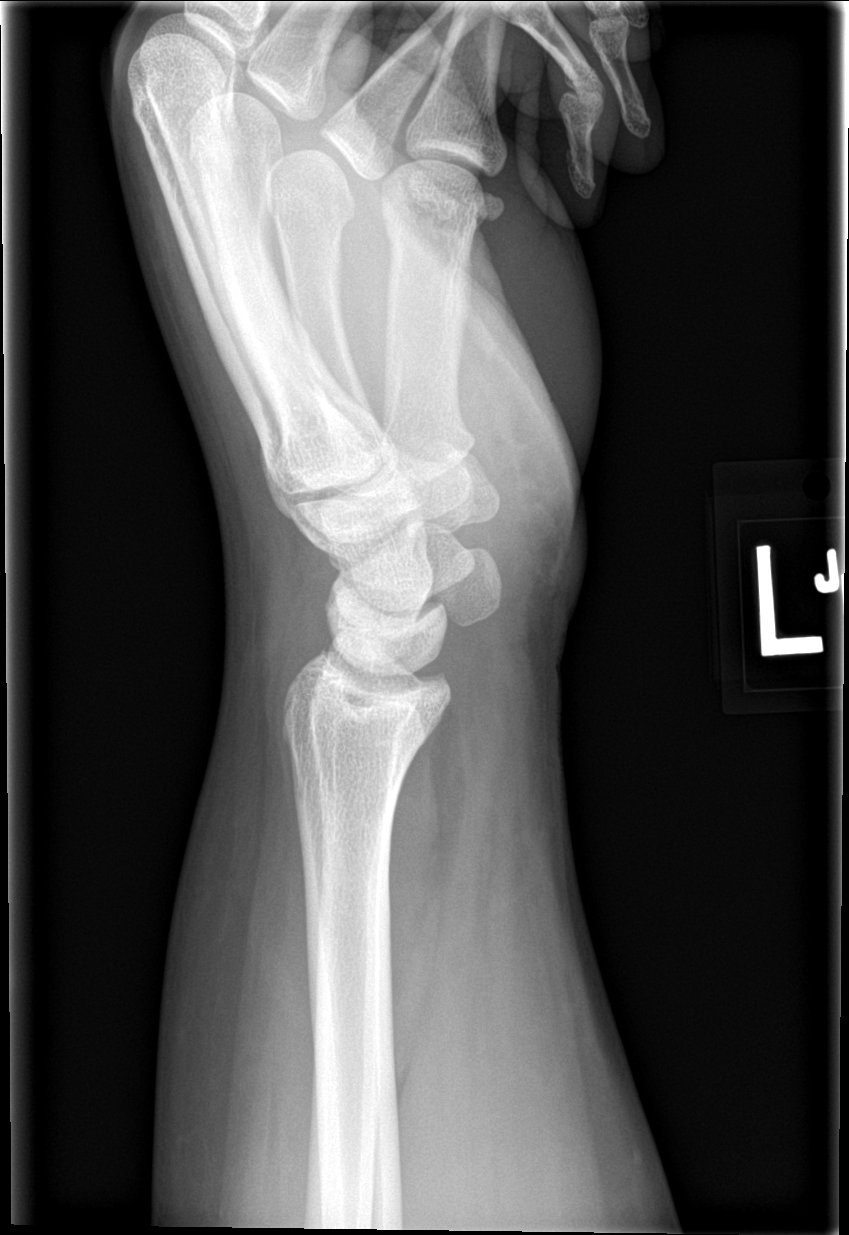

[navicular]
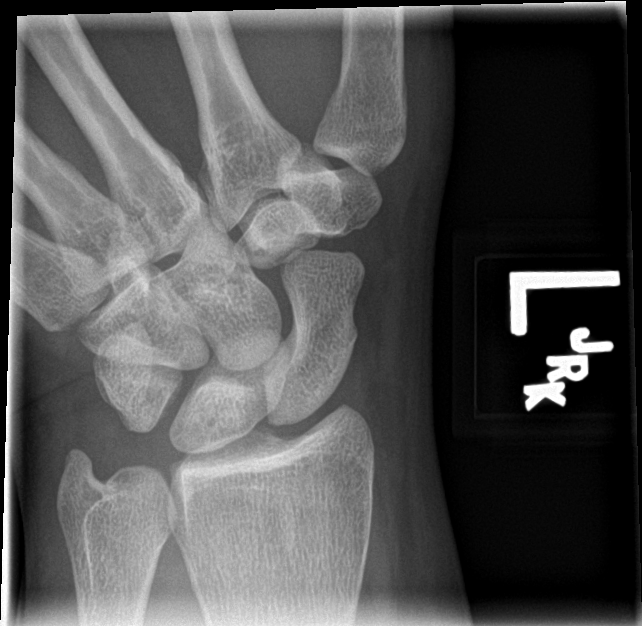

[4 of 4 positions shown; findings below may reference images not displayed]

FINDINGS: There is no evidence of fracture or dislocation. There is no
evidence of arthropathy or other focal bone abnormality. Soft
tissues are unremarkable.
IMPRESSION: Negative.

## 2015-07-15 MED ORDER — OXYCODONE-ACETAMINOPHEN 5-325 MG PO TABS
1.0000 | ORAL_TABLET | ORAL | Status: DC | PRN
  Administered 2015-07-15: 1 via ORAL
  Filled 2015-07-15: qty 1

## 2015-07-15 NOTE — ED Notes (Addendum)
Pt presents w/ c/o L wrist pain. 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 Pt states it causes her pain to grasp and lift. Pt has full range of motion and no alteration of sensation to L hand at this time.

## 2015-07-16 ENCOUNTER — Emergency Department
Admission: EM | Admit: 2015-07-16 | Discharge: 2015-07-16 | Disposition: A | Discharge status: Home / Self Care | Payer: Worker's Compensation | Attending: Emergency Medicine | Admitting: Emergency Medicine

## 2015-07-16 DIAGNOSIS — S66912A Strain of unspecified muscle, fascia and tendon at wrist and hand level, left hand, initial encounter: Secondary | ICD-10-CM

## 2015-07-16 HISTORY — DX: Anxiety disorder, unspecified: F41.9

## 2015-07-16 MED ORDER — IBUPROFEN 800 MG PO TABS: 800.0000 mg | ORAL_TABLET | Freq: Three times a day (TID) | ORAL | Status: DC | PRN

## 2015-07-16 MED ORDER — HYDROCODONE-ACETAMINOPHEN 5-325 MG PO TABS: 1.0000 | ORAL_TABLET | Freq: Four times a day (QID) | ORAL | Status: DC | PRN

## 2015-07-16 MED ORDER — IBUPROFEN 800 MG PO TABS
800.0000 mg | ORAL_TABLET | Freq: Once | ORAL | Status: AC
  Administered 2015-07-16: 800 mg via ORAL
  Filled 2015-07-16: qty 1

## 2015-07-16 MED ORDER — HYDROCODONE-ACETAMINOPHEN 5-325 MG PO TABS
1.0000 | ORAL_TABLET | Freq: Once | ORAL | Status: AC
  Administered 2015-07-16: 1 via ORAL
  Filled 2015-07-16: qty 1

## 2015-07-16 NOTE — ED Provider Notes (Signed)
[facility] Emergency Department Provider Note   ____________________________________________  Time seen: Approximately 12:22 AM  I have reviewed the triage vital signs and the nursing notes.   HISTORY  Chief Complaint Arm Pain    HPI Tracey White is a 35 y.o. female who presents to the ED from home with a chief complaint of wrist pain. 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 Patient is right-hand dominant. Patient did not strike it or fall to the ground. Complains of pain to left wrist without associated numbness/tingling or weakness. Denies recent fever, chills, chest pain, shortness of breath, abdominal pain, nausea, vomiting, diarrhea. Nothing makes her pain better. Movement makes her pain worse.   Past Medical History  Diagnosis Date  . Scoliosis   . Anxiety     There are no active problems to display for this patient.   Past Surgical History  Procedure Laterality Date  . Tubal ligation    . Hernia repair    . Scoliosis repair      Current Outpatient Rx  Name  Route  Sig  Dispense  Refill  . escitalopram (LEXAPRO) 10 MG tablet   Oral   Take 10 mg by mouth daily.         Marland Kitchen amoxicillin-clavulanate (AUGMENTIN) 875-125 MG tablet   Oral   Take 1 tablet by mouth 2 (two) times daily.   14 tablet   0   . cyclobenzaprine (FLEXERIL) 10 MG tablet   Oral   Take 1 tablet (10 mg total) by mouth 3 (three) times daily as needed for muscle spasms.   15 tablet   1   . HYDROcodone-acetaminophen (NORCO) 5-325 MG tablet   Oral   Take 1 tablet by mouth every 6 (six) hours as needed for moderate pain.   15 tablet   0   . ibuprofen (ADVIL,MOTRIN) 800 MG tablet   Oral   Take 1 tablet (800 mg total) by mouth every 8 (eight) hours as needed for moderate pain.   15 tablet   0   . naproxen (NAPROSYN) 500 MG tablet   Oral   Take 1 tablet (500 mg total) by mouth 2 (two) times daily as  needed for mild pain or moderate pain.   10 tablet   0   . sertraline (ZOLOFT) 100 MG tablet   Oral   Take 100 mg by mouth daily.           Allergies Aspirin  History reviewed. No pertinent family history.  Social History Social History  Substance Use Topics  . Smoking status: Current Every Day Smoker -- 0.50 packs/day    Types: Cigarettes  . Smokeless tobacco: Never Used  . Alcohol Use: Yes     Comment: occasionally    Review of Systems  Constitutional: No fever/chills. Eyes: No visual changes. ENT: No sore throat. Cardiovascular: Denies chest pain. Respiratory: Denies shortness of breath. Gastrointestinal: No abdominal pain.  No nausea, no vomiting.  No diarrhea.  No constipation. Genitourinary: Negative for dysuria. Musculoskeletal: Positive for left wrist pain. Negative for back pain. Skin: Negative for rash. Neurological: Negative for headaches, focal weakness or numbness.  10-point ROS otherwise negative.  ____________________________________________   PHYSICAL EXAM:  VITAL SIGNS: ED Triage Vitals  Enc Vitals Group     BP --      Pulse --      Resp --  Temp --      Temp src --      SpO2 --      Weight --      Height --      Head Cir --      Peak Flow --      Pain Score 07/15/15 2212 8     Pain Loc --      Pain Edu? --      Excl. in GC? --     Constitutional: Alert and oriented. Well appearing and in no acute distress. Eyes: Conjunctivae are normal. PERRL. EOMI. Head: Atraumatic. Nose: No congestion/rhinnorhea. Mouth/Throat: Mucous membranes are moist.  Oropharynx non-erythematous. Neck: No stridor.   Cardiovascular: Normal rate, regular rhythm. Grossly normal heart sounds.  Good peripheral circulation. Respiratory: Normal respiratory effort.  No retractions. Lungs CTAB. Gastrointestinal: Soft and nontender. No distention. No abdominal bruits. No CVA tenderness. Musculoskeletal:  Left wrist: Tender to palpation bilaterally,  especially in your scaphoid. Full range of motion with pain. 2+ radial pulses. Full motor strength and sensation. Neurologic:  Normal speech and language. No gross focal neurologic deficits are appreciated. No gait instability. Skin:  Skin is warm, dry and intact. No rash noted. Psychiatric: Mood and affect are normal. Speech and behavior are normal.  ____________________________________________   LABS (all labs ordered are listed, but only abnormal results are displayed)  Labs Reviewed - No data to display ____________________________________________  EKG  None ____________________________________________  RADIOLOGY  Left wrist complete (view by me, interpreted per Dr. Andria MeuseStevens): Negative. ____________________________________________   PROCEDURES  Procedure(s) performed: None  Critical Care performed: No  ____________________________________________   INITIAL IMPRESSION / ASSESSMENT AND PLAN / ED COURSE  Pertinent labs & imaging results that were available during my care of the patient were reviewed by me and considered in my medical decision making (see chart for details).  35 year old female who presents with left wrist strain. Will place in cockup wrist splint, NSAIDs and analgesia and follow-up with orthopedics next week. Strict return precautions given. Patient verbalizes understanding and agree with plan of care. ____________________________________________   FINAL CLINICAL IMPRESSION(S) / ED DIAGNOSES  Final diagnoses:  Wrist strain, left, initial encounter      NEW MEDICATIONS STARTED DURING THIS VISIT:  New Prescriptions   HYDROCODONE-ACETAMINOPHEN (NORCO) 5-325 MG TABLET    Take 1 tablet by mouth every 6 (six) hours as needed for moderate pain.   IBUPROFEN (ADVIL,MOTRIN) 800 MG TABLET    Take 1 tablet (800 mg total) by mouth every 8 (eight) hours as needed for moderate pain.     Note:  This document was prepared using Dragon voice recognition  software and may include unintentional dictation errors.    Irean HongJade J Allisa Einspahr, MD 07/16/15 [phone]

## 2015-07-16 NOTE — Discharge Instructions (Signed)
1. You may take pain medicines as needed (Motrin/Norco #15). 2. You may remove splint to bathe and sleep. 3. Elevate affected area and apply ice over splint several times daily. 4. Return to the ER for worsening symptoms, increased swelling, numbness/tingling or other concerns.  Cryotherapy Cryotherapy is when you put ice on your injury. Ice helps lessen pain and puffiness (swelling) after an injury. Ice works the best when you start using it in the first 24 to 48 hours after an injury. HOME CARE  Put a dry or damp towel between the ice pack and your skin.  You may press gently on the ice pack.  Leave the ice on for no more than 10 to 20 minutes at a time.  Check your skin after 5 minutes to make sure your skin is okay.  Rest at least 20 minutes between ice pack uses.  Stop using ice when your skin loses feeling (numbness).  Do not use ice on someone who cannot tell you when it hurts. This includes small children and people with memory problems (dementia). GET HELP RIGHT AWAY IF:  You have white spots on your skin.  Your skin turns blue or pale.  Your skin feels waxy or hard.  Your puffiness gets worse. MAKE SURE YOU:   Understand these instructions.  Will watch your condition.  Will get help right away if you are not doing well or get worse.   This information is not intended to replace advice given to you by your health care provider. Make sure you discuss any questions you have with your health care provider.   Document Released: 07/03/2007 Document Revised: 04/08/2011 Document Reviewed: 09/06/2010 Elsevier Interactive Patient Education [employer]2016 [employer]

## 2015-09-30 ENCOUNTER — Emergency Department: Payer: 59

## 2015-09-30 ENCOUNTER — Encounter: Payer: Self-pay | Admitting: Emergency Medicine

## 2015-09-30 ENCOUNTER — Emergency Department
Admission: EM | Admit: 2015-09-30 | Discharge: 2015-09-30 | Disposition: A | Discharge status: Home / Self Care | Payer: 59 | Attending: Emergency Medicine | Admitting: Emergency Medicine

## 2015-09-30 DIAGNOSIS — J029 Acute pharyngitis, unspecified: Secondary | ICD-10-CM | POA: Diagnosis present

## 2015-09-30 DIAGNOSIS — J9801 Acute bronchospasm: Secondary | ICD-10-CM

## 2015-09-30 DIAGNOSIS — F17210 Nicotine dependence, cigarettes, uncomplicated: Secondary | ICD-10-CM | POA: Diagnosis not present

## 2015-09-30 DIAGNOSIS — J069 Acute upper respiratory infection, unspecified: Secondary | ICD-10-CM | POA: Diagnosis not present

## 2015-09-30 DIAGNOSIS — B9789 Other viral agents as the cause of diseases classified elsewhere: Secondary | ICD-10-CM

## 2015-09-30 IMAGING — CR DG CHEST 2V
1 series · 2 of 2 positions shown · non-contrast
Comparison: [DATE]

CLINICAL DATA: Cough and shortness of breath for 2-3 weeks, smoker,
breathing improved after breathing treatment

EXAM:
CHEST  2 VIEW

[Series 1: w chest pa · 0.14mm/px · 2 of 2 slices shown]
[im 1/2]
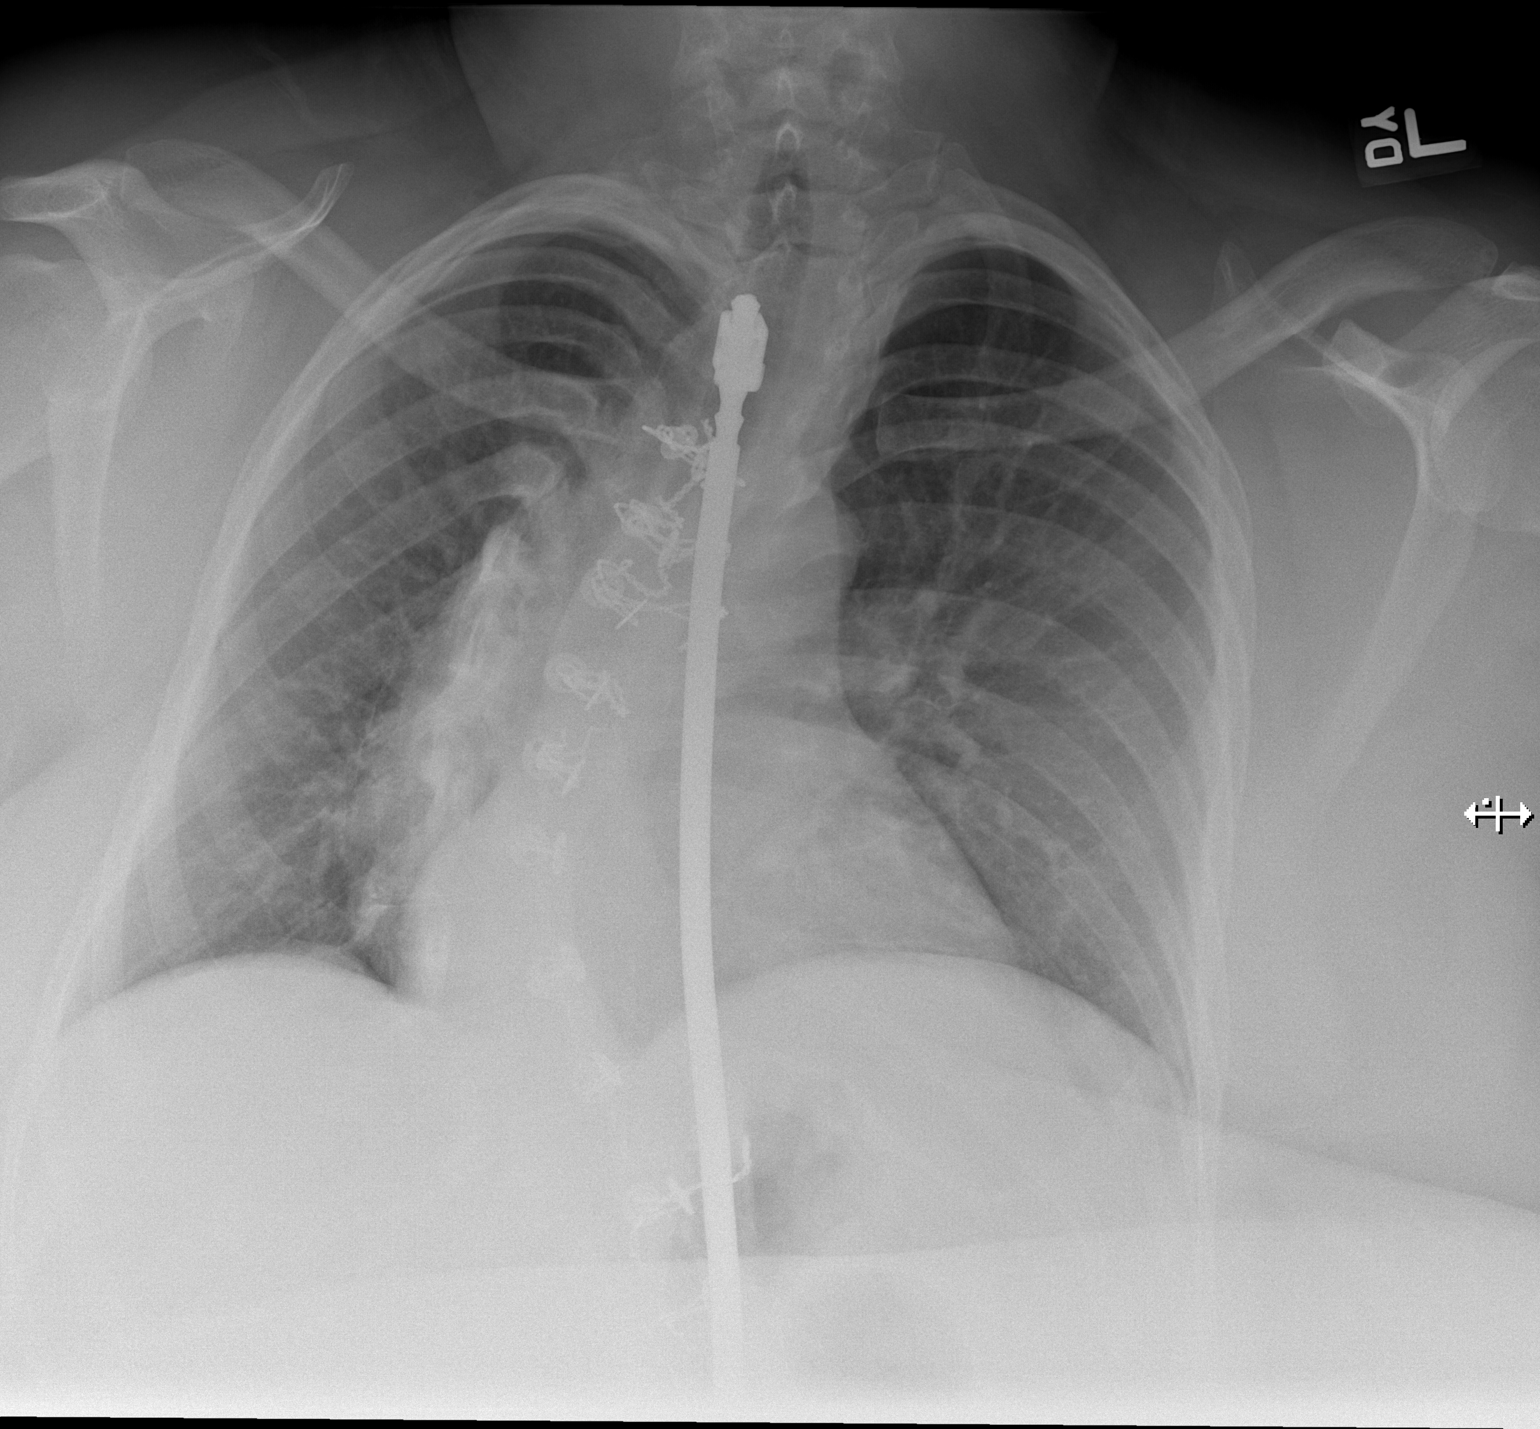
[im 2/2]
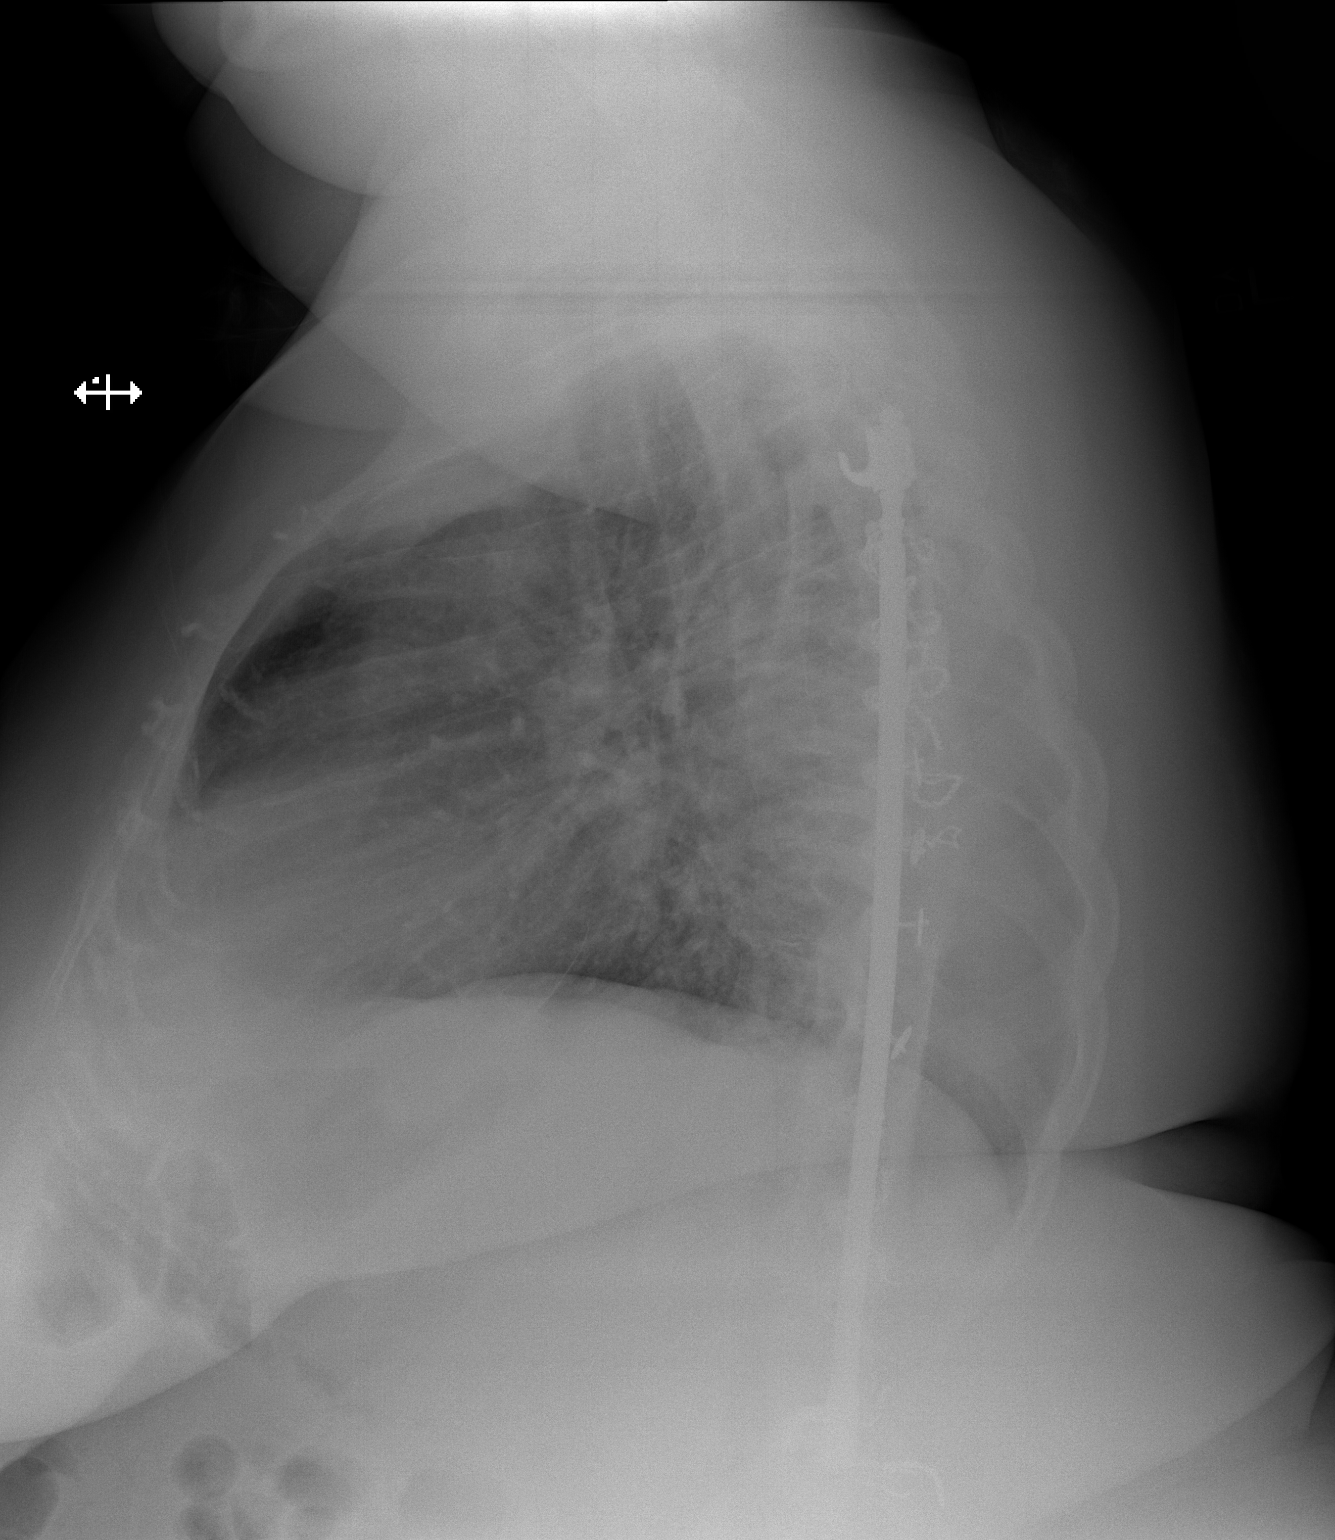

[2 of 2 positions shown; findings below may reference images not displayed]

FINDINGS: Slight prominent cardiac silhouette which may be related to the
significant degree of dextroconvex scoliosis.

Prior spinal fixation surgery.

Mediastinal contours and pulmonary vascularity otherwise normal.

No definite infiltrate, pleural effusion or pneumothorax.

No acute osseous findings.
IMPRESSION: Thoracic deformity secondary to dextroconvex scoliosis with evidence
of prior thoracic spinal fixation.

No acute abnormalities.

## 2015-09-30 MED ORDER — FLUTICASONE PROPIONATE 50 MCG/ACT NA SUSP: 2.0000 | Freq: Every day | NASAL | 0 refills | Status: DC

## 2015-09-30 MED ORDER — IPRATROPIUM-ALBUTEROL 0.5-2.5 (3) MG/3ML IN SOLN
RESPIRATORY_TRACT | Status: AC
  Administered 2015-09-30: 3 mL via RESPIRATORY_TRACT
  Filled 2015-09-30: qty 3

## 2015-09-30 MED ORDER — BENZONATATE 100 MG PO CAPS: 100.0000 mg | ORAL_CAPSULE | Freq: Three times a day (TID) | ORAL | 0 refills | Status: DC | PRN

## 2015-09-30 MED ORDER — IPRATROPIUM-ALBUTEROL 0.5-2.5 (3) MG/3ML IN SOLN
3.0000 mL | Freq: Four times a day (QID) | RESPIRATORY_TRACT | Status: DC
  Administered 2015-09-30: 3 mL via RESPIRATORY_TRACT

## 2015-09-30 MED ORDER — GUAIFENESIN-CODEINE 100-10 MG/5ML PO SOLN
10.0000 mL | Freq: Once | ORAL | Status: AC
  Administered 2015-09-30: 10 mL via ORAL
  Filled 2015-09-30: qty 10

## 2015-09-30 MED ORDER — PREDNISONE 10 MG PO TABS: 10.0000 mg | ORAL_TABLET | Freq: Two times a day (BID) | ORAL | 0 refills | Status: DC

## 2015-09-30 MED ORDER — GUAIFENESIN-CODEINE 100-10 MG/5ML PO SOLN: 5.0000 mL | Freq: Three times a day (TID) | ORAL | 0 refills | Status: DC | PRN

## 2015-09-30 MED ORDER — ALBUTEROL SULFATE HFA 108 (90 BASE) MCG/ACT IN AERS: 2.0000 | INHALATION_SPRAY | Freq: Four times a day (QID) | RESPIRATORY_TRACT | 0 refills | Status: DC | PRN

## 2015-09-30 NOTE — ED Provider Notes (Signed)
[facility] Emergency Department Provider Note ____________________________________________  Time seen: 1515  I have reviewed the triage vital signs and the nursing notes.  HISTORY  Chief Complaint  Sore Throat  HPI Tracey White is a 35 y.o. female presents to the ED with a one-week complaint of nonproductive cough, shortness of breath, and sore throat. Patient describes onset and resolution of fevers early on in her course for the first 2 days. She notes temperature max of 102F. Since that time she's had a persistent, ongoing nonproductive cough. She is also noticed some ear pressure, nasal congestion, and postnasal drainage. She does Symptoms are worse with supine position. She is admitted half pack per day smoker, but has been unable to smoke secondary to her current symptoms. She denies any sick contacts, recent travel, or hemoptysis.  Past Medical History:  Diagnosis Date  . Anxiety   . Scoliosis     There are no active problems to display for this patient.   Past Surgical History:  Procedure Laterality Date  . HERNIA REPAIR    . scoliosis repair    . TUBAL LIGATION      Prior to Admission medications   Medication Sig Start Date End Date Taking? Authorizing Provider  albuterol (PROVENTIL HFA;VENTOLIN HFA) 108 (90 Base) MCG/ACT inhaler Inhale 2 puffs into the lungs every 6 (six) hours as needed for wheezing or shortness of breath. 09/30/15   Cael Worth V Bacon Kassia Demarinis, PA-C  amoxicillin-clavulanate (AUGMENTIN) 875-125 MG tablet Take 1 tablet by mouth 2 (two) times daily. 12/19/14   Darci Currentandolph N Brown, MD  benzonatate (TESSALON PERLES) 100 MG capsule Take 1 capsule (100 mg total) by mouth 3 (three) times daily as needed for cough (Take 1-2 per dose). 09/30/15   Nimra Puccinelli V Bacon Retta Pitcher, PA-C  cyclobenzaprine (FLEXERIL) 10 MG tablet Take 1 tablet (10 mg total) by mouth 3 (three) times daily as needed for muscle spasms. 08/10/14   Myrna Blazeravid Matthew Schaevitz, MD   escitalopram (LEXAPRO) 10 MG tablet Take 10 mg by mouth daily.    Historical Provider, MD  fluticasone (FLONASE) 50 MCG/ACT nasal spray Place 2 sprays into both nostrils daily. 09/30/15   Nyela Cortinas V Bacon Manaia Samad, PA-C  guaiFENesin-codeine 100-10 MG/5ML syrup Take 5 mLs by mouth 3 (three) times daily as needed for cough. 09/30/15   Micki Cassel V Bacon Maurico Perrell, PA-C  HYDROcodone-acetaminophen (NORCO) 5-325 MG tablet Take 1 tablet by mouth every 6 (six) hours as needed for moderate pain. 07/16/15   Irean HongJade J Sung, MD  ibuprofen (ADVIL,MOTRIN) 800 MG tablet Take 1 tablet (800 mg total) by mouth every 8 (eight) hours as needed for moderate pain. 07/16/15   Irean HongJade J Sung, MD  naproxen (NAPROSYN) 500 MG tablet Take 1 tablet (500 mg total) by mouth 2 (two) times daily as needed for mild pain or moderate pain. 08/10/14   Myrna Blazeravid Matthew Schaevitz, MD  predniSONE (DELTASONE) 10 MG tablet Take 1 tablet (10 mg total) by mouth 2 (two) times daily with a meal. 09/30/15   Ruey Storer V Bacon Deirdra Heumann, PA-C  sertraline (ZOLOFT) 100 MG tablet Take 100 mg by mouth daily.    Historical Provider, MD    Allergies Aspirin  No family history on file.  Social History Social History  Substance Use Topics  . Smoking status: Current Every Day Smoker    Packs/day: 0.50    Types: Cigarettes  . Smokeless tobacco: Never Used  . Alcohol use Yes     Comment: occasionally   Review of  Systems  Constitutional: Negative for fever. Eyes: Negative for visual changes. ENT: Positive for sore throat. Cardiovascular: Negative for chest pain. Reports cough. Respiratory: Negative for shortness of breath. Gastrointestinal: Negative for abdominal pain, vomiting and diarrhea. Skin: Negative for rash. ____________________________________________  PHYSICAL EXAM:  VITAL SIGNS: ED Triage Vitals  Enc Vitals Group     BP 09/30/15 1446 (!) 100/55     Pulse Rate 09/30/15 1446 92     Resp 09/30/15 1446 18     Temp 09/30/15 1446 98 F (36.7 C)     Temp  Source 09/30/15 1446 Oral     SpO2 09/30/15 1446 96 %     Weight 09/30/15 1447 (!) 312 lb (141.5 kg)     Height 09/30/15 1447 5\' 1"  (1.549 m)     Head Circumference --      Peak Flow --      Pain Score 09/30/15 1447 10     Pain Loc --      Pain Edu? --      Excl. in GC? --    Constitutional: Alert and oriented. Well appearing and in no distress. Head: Normocephalic and atraumatic.      Eyes: Conjunctivae are normal. PERRL. Normal extraocular movements      Ears: Canals clear. TMs intact bilaterally. Left TM with serous effusion noted.    Nose: No congestion. Enlarged turbinates. Clear rhinorrhea.   Mouth/Throat: Mucous membranes are moist. Uvula midline. Tonsils enlarged without erythema, edema, or exudate.    Neck: Supple. No thyromegaly. Hematological/Lymphatic/Immunological: No cervical lymphadenopathy. Cardiovascular: Normal rate, regular rhythm.  Respiratory: Normal respiratory effort. No wheezes/rales/rhonchi. Persistent dry, non-productive coughing during interview and exam. Gastrointestinal: Soft and nontender. No distention. Skin:  Skin is warm, dry and intact. No rash noted. 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  No acute abnormalities. ____________________________________________  PROCEDURES  DuoNeb x 1 Guaifenesin-codeine elixir 10 ml PO ____________________________________________  INITIAL IMPRESSION / ASSESSMENT AND PLAN / ED COURSE  Patient with symptoms that are consistent with an acute bronchospasm likely due to an allergic and/or viral component. Patient responds well to DuoNeb as well as codeine cough syrup in the ED. She is resting comfortably in the room, but easily aroused and reports improvement in her symptoms. She will be discharged with prescriptions for Flonase, albuterol inhaler, prednisone,  Tessalon Perles, and codeine cough syrup. She is advised to continue to increase fluids to help promote productive cough. She should follow-up with her primary care provider or return to the ED immediately for severe shortness of breath or chest pain.  Clinical Course   ____________________________________________  FINAL CLINICAL IMPRESSION(S) / ED DIAGNOSES  Final diagnoses:  Viral URI with cough  Bronchospasm, acute      Lissa Hoard, PA-C 09/30/15 1730    Emily Filbert, MD 09/30/15 1840

## 2015-09-30 NOTE — Discharge Instructions (Signed)
Take the prescription meds as directed. Increase fluid intake to promote productive cough. Follow-up with [facility] for sleep study and surgical consultation as discussed. Return to the ED for severe shortness of breath of chest pains.

## 2015-09-30 NOTE — ED Triage Notes (Signed)
Cough and sorethroat since yesterday.   

## 2015-10-18 ENCOUNTER — Observation Stay
Admission: EM | Admit: 2015-10-18 | Discharge: 2015-10-20 | Disposition: A | Discharge status: Home / Self Care | Payer: 59 | Attending: Internal Medicine | Admitting: Internal Medicine

## 2015-10-18 ENCOUNTER — Observation Stay: Payer: 59

## 2015-10-18 ENCOUNTER — Emergency Department: Payer: 59

## 2015-10-18 ENCOUNTER — Encounter: Payer: Self-pay | Admitting: Emergency Medicine

## 2015-10-18 DIAGNOSIS — E669 Obesity, unspecified: Secondary | ICD-10-CM | POA: Insufficient documentation

## 2015-10-18 DIAGNOSIS — Z833 Family history of diabetes mellitus: Secondary | ICD-10-CM | POA: Insufficient documentation

## 2015-10-18 DIAGNOSIS — R7989 Other specified abnormal findings of blood chemistry: Secondary | ICD-10-CM | POA: Diagnosis present

## 2015-10-18 DIAGNOSIS — M419 Scoliosis, unspecified: Secondary | ICD-10-CM | POA: Diagnosis not present

## 2015-10-18 DIAGNOSIS — F419 Anxiety disorder, unspecified: Secondary | ICD-10-CM | POA: Insufficient documentation

## 2015-10-18 DIAGNOSIS — R079 Chest pain, unspecified: Secondary | ICD-10-CM | POA: Diagnosis present

## 2015-10-18 DIAGNOSIS — Z23 Encounter for immunization: Secondary | ICD-10-CM | POA: Diagnosis not present

## 2015-10-18 DIAGNOSIS — Z886 Allergy status to analgesic agent status: Secondary | ICD-10-CM | POA: Insufficient documentation

## 2015-10-18 DIAGNOSIS — J069 Acute upper respiratory infection, unspecified: Secondary | ICD-10-CM | POA: Insufficient documentation

## 2015-10-18 DIAGNOSIS — I071 Rheumatic tricuspid insufficiency: Secondary | ICD-10-CM | POA: Insufficient documentation

## 2015-10-18 DIAGNOSIS — Z6841 Body Mass Index (BMI) 40.0 and over, adult: Secondary | ICD-10-CM | POA: Insufficient documentation

## 2015-10-18 DIAGNOSIS — F17210 Nicotine dependence, cigarettes, uncomplicated: Secondary | ICD-10-CM | POA: Diagnosis not present

## 2015-10-18 DIAGNOSIS — R52 Pain, unspecified: Secondary | ICD-10-CM

## 2015-10-18 DIAGNOSIS — J40 Bronchitis, not specified as acute or chronic: Secondary | ICD-10-CM | POA: Insufficient documentation

## 2015-10-18 DIAGNOSIS — M940 Chondrocostal junction syndrome [Tietze]: Secondary | ICD-10-CM | POA: Diagnosis not present

## 2015-10-18 DIAGNOSIS — F329 Major depressive disorder, single episode, unspecified: Secondary | ICD-10-CM | POA: Insufficient documentation

## 2015-10-18 DIAGNOSIS — R778 Other specified abnormalities of plasma proteins: Secondary | ICD-10-CM

## 2015-10-18 DIAGNOSIS — Z8249 Family history of ischemic heart disease and other diseases of the circulatory system: Secondary | ICD-10-CM | POA: Insufficient documentation

## 2015-10-18 LAB — CBC
HCT: 34.3 % — ABNORMAL LOW (ref 35.0–47.0)
Hemoglobin: 11.1 g/dL — ABNORMAL LOW (ref 12.0–16.0)
MCH: 23.8 pg — AB (ref 26.0–34.0)
MCHC: 32.3 g/dL (ref 32.0–36.0)
MCV: 73.7 fL — ABNORMAL LOW (ref 80.0–100.0)
Platelets: 265 10*3/uL (ref 150–440)
RBC: 4.66 MIL/uL (ref 3.80–5.20)
RDW: 16.9 % — AB (ref 11.5–14.5)
WBC: 10.9 10*3/uL (ref 3.6–11.0)

## 2015-10-18 LAB — COMPREHENSIVE METABOLIC PANEL
ALT: 11 U/L — AB (ref 14–54)
ANION GAP: 5 (ref 5–15)
AST: 14 U/L — ABNORMAL LOW (ref 15–41)
Albumin: 3.6 g/dL (ref 3.5–5.0)
Alkaline Phosphatase: 100 U/L (ref 38–126)
BUN: 10 mg/dL (ref 6–20)
CALCIUM: 8.9 mg/dL (ref 8.9–10.3)
CHLORIDE: 103 mmol/L (ref 101–111)
CO2: 28 mmol/L (ref 22–32)
CREATININE: 0.74 mg/dL (ref 0.44–1.00)
Glucose, Bld: 99 mg/dL (ref 65–99)
Potassium: 3.9 mmol/L (ref 3.5–5.1)
SODIUM: 136 mmol/L (ref 135–145)
Total Bilirubin: <0.1 mg/dL — ABNORMAL LOW (ref 0.3–1.2)
Total Protein: 9 g/dL — ABNORMAL HIGH (ref 6.5–8.1)

## 2015-10-18 LAB — POCT PREGNANCY, URINE: PREG TEST UR: NEGATIVE

## 2015-10-18 LAB — TROPONIN I
TROPONIN I: 0.06 ng/mL — AB (ref <0.03)
Troponin I: <0.03 ng/mL (ref <0.03)
Troponin I: <0.03 ng/mL

## 2015-10-18 LAB — GLUCOSE, CAPILLARY: GLUCOSE-CAPILLARY: 117 mg/dL — AB (ref 65–99)

## 2015-10-18 IMAGING — CT CT ANGIO CHEST
2 of 6 series · 17 of 46 positions shown · IV contrast (isovue)
Comparison: Prior radiograph from [DATE].

CLINICAL DATA: Initial evaluation for acute chest pain.

EXAM:
CT ANGIOGRAPHY CHEST WITH CONTRAST
TECHNIQUE: Multidetector CT imaging of the chest was performed using the
standard protocol during bolus administration of intravenous
contrast. Multiplanar CT image reconstructions and MIPs were
obtained to evaluate the vascular anatomy.
CONTRAST:  100 cc of Isovue 370.

[Series 6: thins (person_name) · axial · 0.85mm/px · z∈[-615,-403]mm · 15 of 234 slices shown]
[im 11/234  lung]
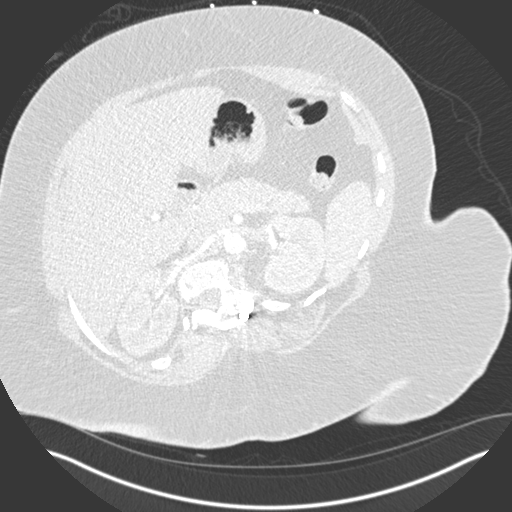
[im 31/234  soft-tissue]
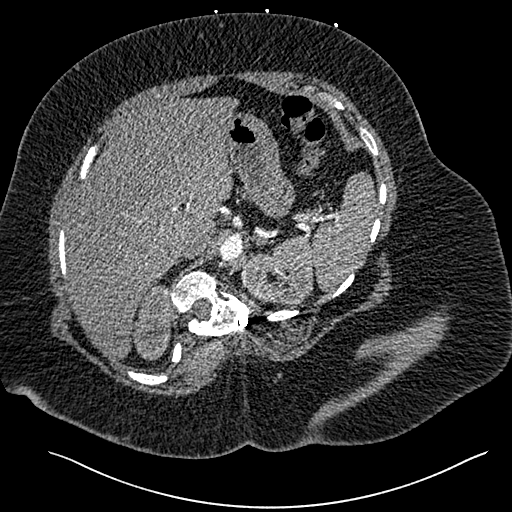
[im 41/234  lung]
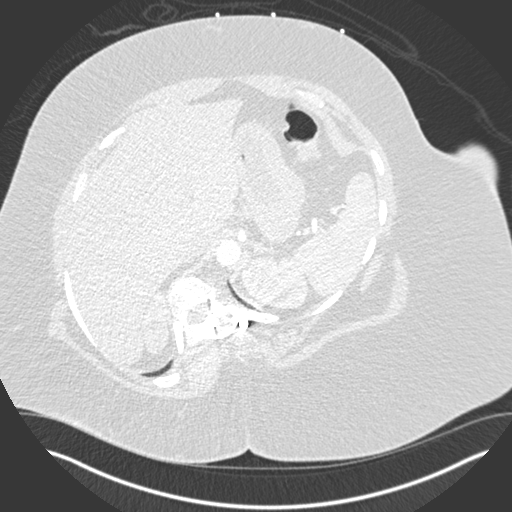
[im 61/234  soft-tissue]
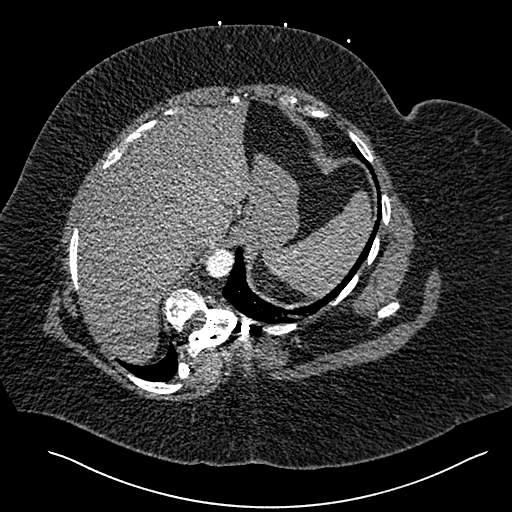
[im 71/234  lung]
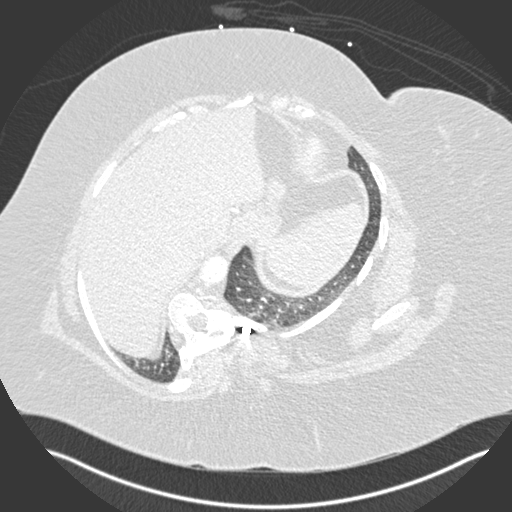
[im 92/234  soft-tissue]
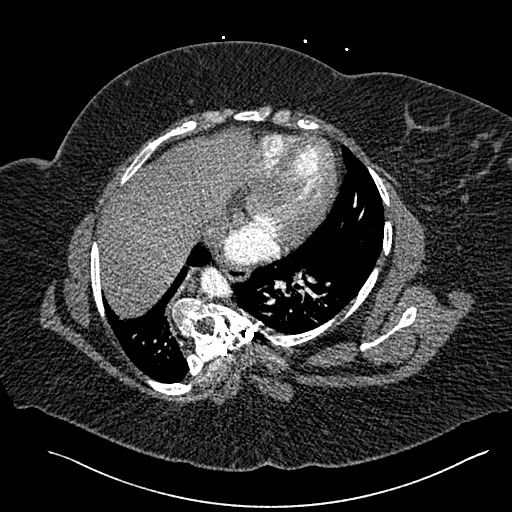
[im 102/234  lung]
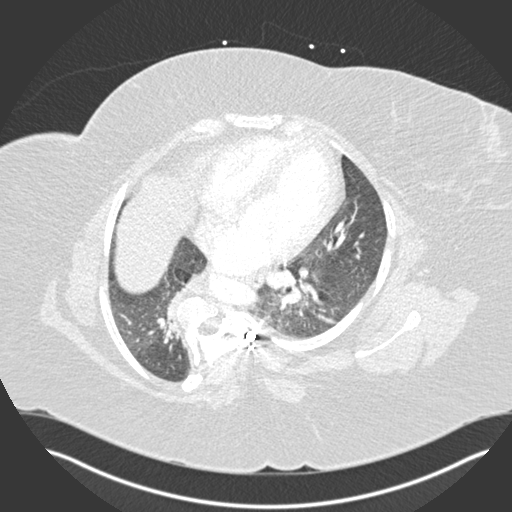
[im 122/234  soft-tissue]
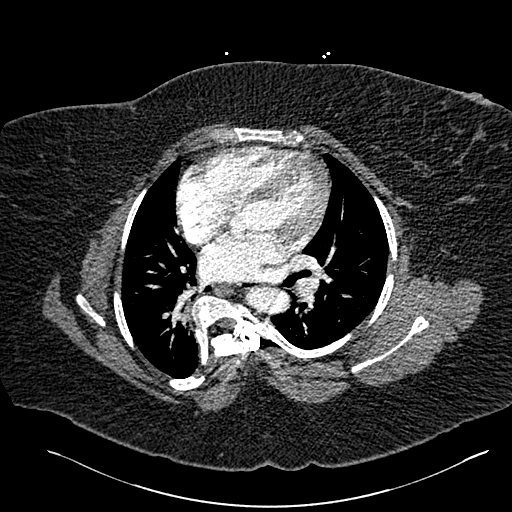
[im 132/234  lung]
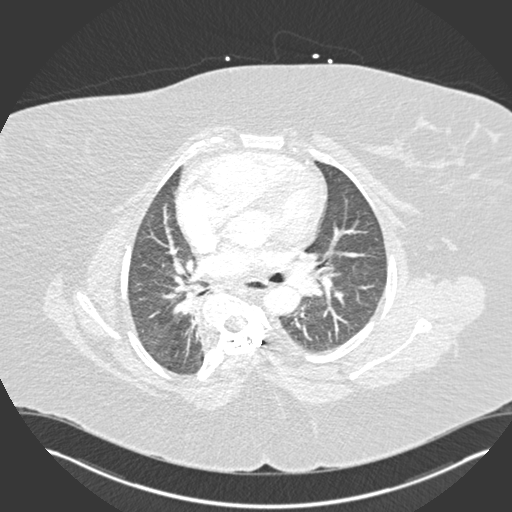
[im 142/234  soft-tissue]
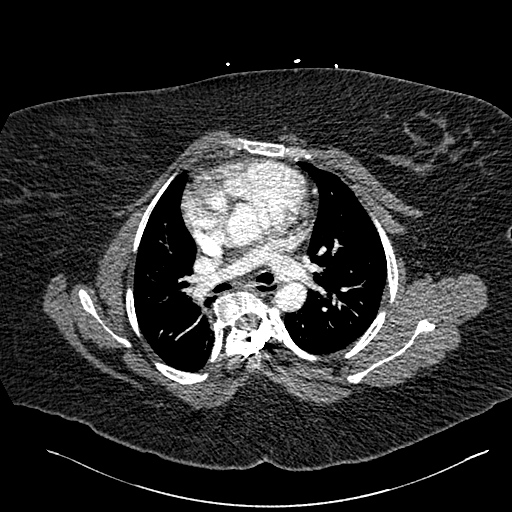
[im 163/234  lung]
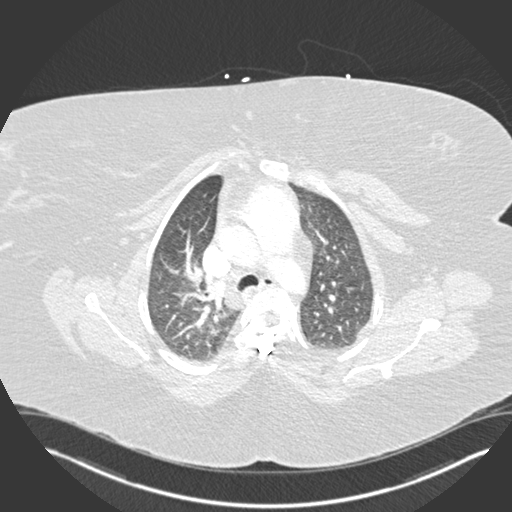
[im 173/234  soft-tissue]
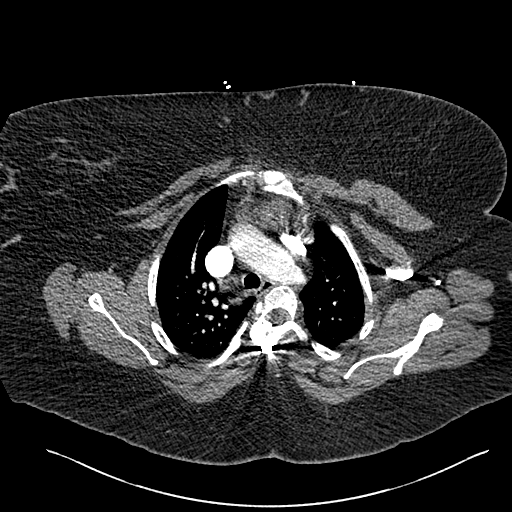
[im 193/234  lung]
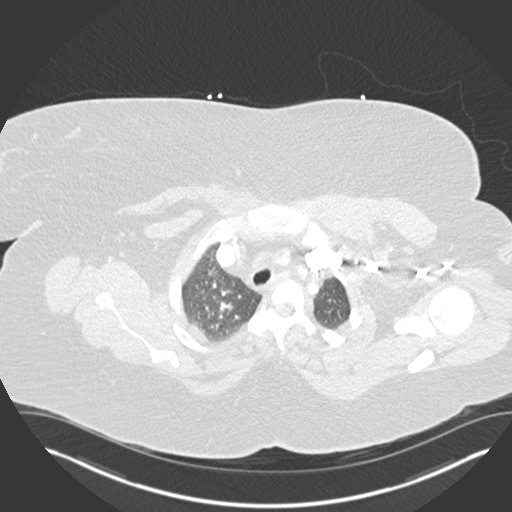
[im 203/234  soft-tissue]
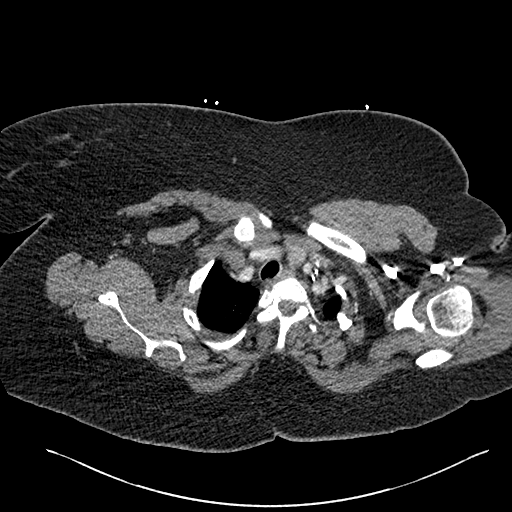
[im 223/234  lung]
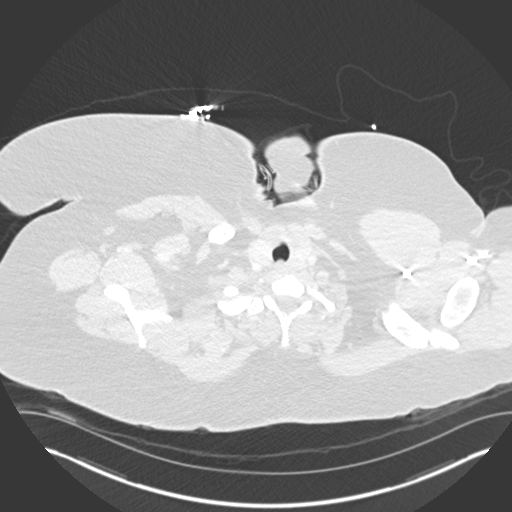

[Series 8: coronal mpr · coronal · 0.47mm/px · 2 of 119 slices shown]
[im 40/119  soft-tissue]
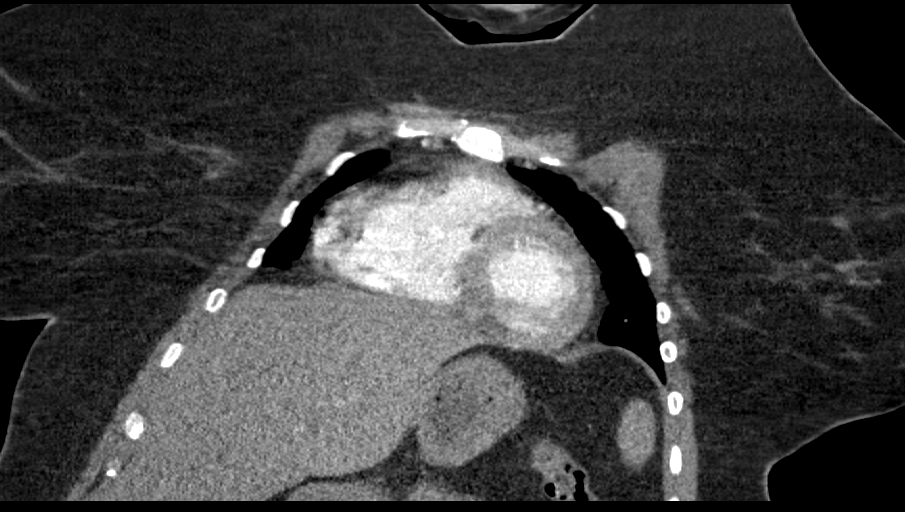
[im 79/119  soft-tissue]
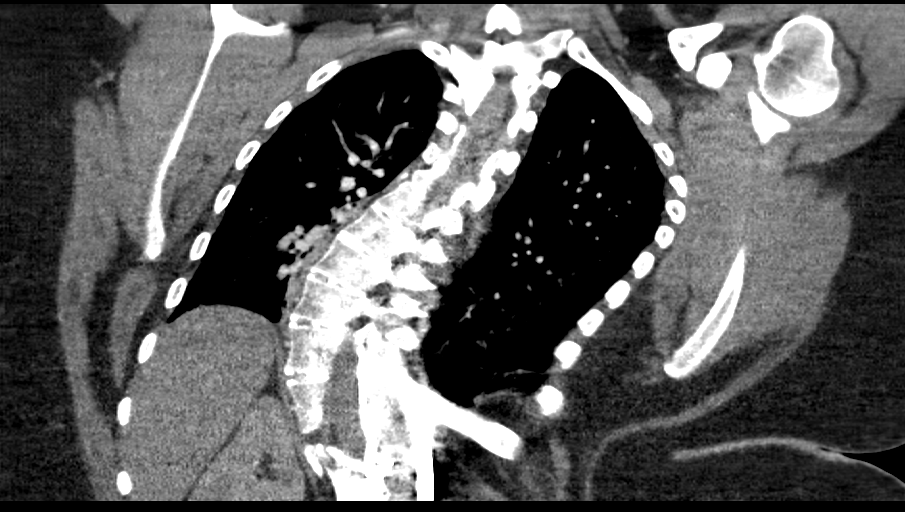

[17 of 46 positions shown; findings below may reference images not displayed]

FINDINGS: Cardiovascular: Intrathoracic aorta of normal caliber without acute
abnormality. Partially visualized great vessels within normal
limits. Heart size normal. No pericardial effusion.

Evaluation of the pulmonary arteries mildly limited by timing the
contrast bolus and body habitus. Main pulmonary artery within normal
limits for size measuring 2.6 cm. The no large or central filling
defect to suggest acute pulmonary embolism identified. Re-formatted
imaging confirms these findings. Please note that evaluation for
possible smaller segmental and subsegmental emboli somewhat limited
on this exam.

Mediastinum/Nodes: Visualized thyroid is normal. No pathologically
enlarged mediastinal, hilar, or axillary lymph nodes identified.
Esophagus within normal limits.

Lungs/Pleura: Scattered bibasilar atelectatic changes present. No
definite focal infiltrates. No pulmonary edema or pleural effusion.
No pneumothorax. No worrisome pulmonary nodule or mass.

Upper Abdomen: Visualized portions of the upper abdomen demonstrate
no acute abnormality.

Musculoskeletal: Severe scoliosis with sequela of prior Harrington
rod fixation. No acute osseous abnormality. No worrisome lytic or
blastic osseous lesions.

Review of the MIP images confirms the above findings.
IMPRESSION: 1. Somewhat limited study due to timing of the contrast bolus and
body habitus. No large or central pulmonary embolus identified.
Evaluation for possible small distal emboli fairly limited on this
exam.
2. No other acute cardiopulmonary abnormality identified.
3. Mild bibasilar atelectasis.
4. Severe scoliosis.

## 2015-10-18 MED ORDER — ALPRAZOLAM 0.5 MG PO TABS
0.5000 mg | ORAL_TABLET | Freq: Three times a day (TID) | ORAL | Status: DC | PRN
  Administered 2015-10-18 – 2015-10-19 (×3): 0.5 mg via ORAL
  Filled 2015-10-18 (×3): qty 1

## 2015-10-18 MED ORDER — METHYLPREDNISOLONE SODIUM SUCC 125 MG IJ SOLR
60.0000 mg | Freq: Four times a day (QID) | INTRAMUSCULAR | Status: DC
  Administered 2015-10-18 – 2015-10-19 (×4): 60 mg via INTRAVENOUS
  Filled 2015-10-18 (×4): qty 2

## 2015-10-18 MED ORDER — MORPHINE SULFATE (PF) 2 MG/ML IV SOLN
INTRAVENOUS | Status: AC
  Administered 2015-10-18: 2 mg via INTRAVENOUS
  Filled 2015-10-18: qty 1

## 2015-10-18 MED ORDER — IPRATROPIUM-ALBUTEROL 0.5-2.5 (3) MG/3ML IN SOLN
3.0000 mL | Freq: Four times a day (QID) | RESPIRATORY_TRACT | Status: DC
  Administered 2015-10-18 – 2015-10-20 (×8): 3 mL via RESPIRATORY_TRACT
  Filled 2015-10-18 (×8): qty 3

## 2015-10-18 MED ORDER — ASPIRIN 81 MG PO CHEW
243.0000 mg | CHEWABLE_TABLET | Freq: Once | ORAL | Status: AC
  Administered 2015-10-18: 243 mg via ORAL
  Filled 2015-10-18: qty 3

## 2015-10-18 MED ORDER — ESCITALOPRAM OXALATE 10 MG PO TABS
20.0000 mg | ORAL_TABLET | Freq: Every day | ORAL | Status: DC
  Administered 2015-10-18 – 2015-10-20 (×3): 20 mg via ORAL
  Filled 2015-10-18 (×3): qty 2

## 2015-10-18 MED ORDER — LEVOFLOXACIN 500 MG PO TABS
750.0000 mg | ORAL_TABLET | Freq: Every day | ORAL | Status: DC
  Administered 2015-10-18 – 2015-10-20 (×3): 750 mg via ORAL
  Filled 2015-10-18 (×3): qty 2

## 2015-10-18 MED ORDER — ENOXAPARIN SODIUM 40 MG/0.4ML ~~LOC~~ SOLN
40.0000 mg | Freq: Two times a day (BID) | SUBCUTANEOUS | Status: DC
  Administered 2015-10-18 – 2015-10-19 (×4): 40 mg via SUBCUTANEOUS
  Filled 2015-10-18 (×4): qty 0.4

## 2015-10-18 MED ORDER — METOPROLOL TARTRATE 5 MG/5ML IV SOLN
2.5000 mg | Freq: Four times a day (QID) | INTRAVENOUS | Status: DC | PRN
  Administered 2015-10-19 (×2): 2.5 mg via INTRAVENOUS
  Filled 2015-10-18 (×2): qty 5

## 2015-10-18 MED ORDER — ACETAMINOPHEN 325 MG PO TABS
650.0000 mg | ORAL_TABLET | Freq: Four times a day (QID) | ORAL | Status: DC | PRN
  Administered 2015-10-19 – 2015-10-20 (×3): 650 mg via ORAL
  Filled 2015-10-18 (×3): qty 2

## 2015-10-18 MED ORDER — MORPHINE SULFATE (PF) 2 MG/ML IV SOLN
2.0000 mg | INTRAVENOUS | Status: DC | PRN
  Administered 2015-10-18 (×2): 2 mg via INTRAVENOUS
  Filled 2015-10-18: qty 1

## 2015-10-18 MED ORDER — OXYCODONE HCL 5 MG PO TABS
5.0000 mg | ORAL_TABLET | ORAL | Status: DC | PRN
  Administered 2015-10-18 – 2015-10-19 (×2): 5 mg via ORAL
  Filled 2015-10-18 (×2): qty 1

## 2015-10-18 MED ORDER — SODIUM CHLORIDE 0.9% FLUSH
3.0000 mL | Freq: Two times a day (BID) | INTRAVENOUS | Status: DC
  Administered 2015-10-18 – 2015-10-19 (×4): 3 mL via INTRAVENOUS

## 2015-10-18 MED ORDER — ACETAMINOPHEN 650 MG RE SUPP: 650.0000 mg | Freq: Four times a day (QID) | RECTAL | Status: DC | PRN

## 2015-10-18 MED ORDER — IOPAMIDOL (ISOVUE-370) INJECTION 76%
100.0000 mL | Freq: Once | INTRAVENOUS | Status: AC | PRN
  Administered 2015-10-18: 100 mL via INTRAVENOUS

## 2015-10-18 MED ORDER — ACETAMINOPHEN 325 MG PO TABS
650.0000 mg | ORAL_TABLET | Freq: Once | ORAL | Status: AC
  Administered 2015-10-18: 650 mg via ORAL
  Filled 2015-10-18: qty 2

## 2015-10-18 MED ORDER — ESCITALOPRAM OXALATE 10 MG PO TABS: 40.0000 mg | ORAL_TABLET | Freq: Every day | ORAL | Status: DC

## 2015-10-18 NOTE — ED Notes (Signed)
Patient transported to CT 

## 2015-10-18 NOTE — ED Triage Notes (Signed)
Pt to triage in tears, holding chest. Pt c/o chest pain that started this AM, pressure and throbbing from left side of chest that radiates into left shoulder/arm/back. Pt denies dizziness and SOB.

## 2015-10-18 NOTE — ED Provider Notes (Signed)
[facility] Emergency Department Provider Note    ____________________________________________   I have reviewed the triage vital signs and the nursing notes.   HISTORY  Chief Complaint Chest Pain   History limited by: Not Limited   HPI Tracey White is a 35 y.o. female who presents to the emergency department today because of concerns for chest pain. It is located in the left chest. She describes it as pressure-like. She states that she had had 3 episodes of similar pain earlier in the day. Tonight she was not doing anything when it started. It was more severe. She states that the pain was radiating into her back and left arm. The time my examination it had eased off but she still feels some discomfort. Denies any recent fevers.Has had similar episodes in the past when it's been related to anxiety.    Past Medical History:  Diagnosis Date  . Anxiety   . Scoliosis     There are no active problems to display for this patient.   Past Surgical History:  Procedure Laterality Date  . HERNIA REPAIR    . scoliosis repair    . TUBAL LIGATION      Prior to Admission medications   Medication Sig Start Date End Date Taking? Authorizing Provider  albuterol (PROVENTIL HFA;VENTOLIN HFA) 108 (90 Base) MCG/ACT inhaler Inhale 2 puffs into the lungs every 6 (six) hours as needed for wheezing or shortness of breath. 09/30/15   Jenise V Bacon Menshew, PA-C  amoxicillin-clavulanate (AUGMENTIN) 875-125 MG tablet Take 1 tablet by mouth 2 (two) times daily. 12/19/14   Darci Currentandolph N Brown, MD  benzonatate (TESSALON PERLES) 100 MG capsule Take 1 capsule (100 mg total) by mouth 3 (three) times daily as needed for cough (Take 1-2 per dose). 09/30/15   Jenise V Bacon Menshew, PA-C  cyclobenzaprine (FLEXERIL) 10 MG tablet Take 1 tablet (10 mg total) by mouth 3 (three) times daily as needed for muscle spasms. 08/10/14   Myrna Blazeravid Matthew Schaevitz, MD  escitalopram (LEXAPRO) 10 MG tablet  Take 10 mg by mouth daily.    Historical Provider, MD  fluticasone (FLONASE) 50 MCG/ACT nasal spray Place 2 sprays into both nostrils daily. 09/30/15   Jenise V Bacon Menshew, PA-C  guaiFENesin-codeine 100-10 MG/5ML syrup Take 5 mLs by mouth 3 (three) times daily as needed for cough. 09/30/15   Jenise V Bacon Menshew, PA-C  HYDROcodone-acetaminophen (NORCO) 5-325 MG tablet Take 1 tablet by mouth every 6 (six) hours as needed for moderate pain. 07/16/15   Irean HongJade J Sung, MD  ibuprofen (ADVIL,MOTRIN) 800 MG tablet Take 1 tablet (800 mg total) by mouth every 8 (eight) hours as needed for moderate pain. 07/16/15   Irean HongJade J Sung, MD  naproxen (NAPROSYN) 500 MG tablet Take 1 tablet (500 mg total) by mouth 2 (two) times daily as needed for mild pain or moderate pain. 08/10/14   Myrna Blazeravid Matthew Schaevitz, MD  predniSONE (DELTASONE) 10 MG tablet Take 1 tablet (10 mg total) by mouth 2 (two) times daily with a meal. 09/30/15   Jenise V Bacon Menshew, PA-C  sertraline (ZOLOFT) 100 MG tablet Take 100 mg by mouth daily.    Historical Provider, MD    Allergies Aspirin  History reviewed. No pertinent family history.  Social History Social History  Substance Use Topics  . Smoking status: Current Every Day Smoker    Packs/day: 0.50    Types: Cigarettes  . Smokeless tobacco: Never Used  . Alcohol use Yes  Comment: occasionally    Review of Systems  Constitutional: Negative for fever. Cardiovascular: Positive for chest pain. Respiratory: Negative for shortness of breath. Gastrointestinal: Negative for abdominal pain, vomiting and diarrhea. Genitourinary: Negative for dysuria. Musculoskeletal: Negative for back pain. Skin: Negative for rash. Neurological: Negative for headaches, focal weakness or numbness.   10-point ROS otherwise negative.  ____________________________________________   PHYSICAL EXAM:  VITAL SIGNS: ED Triage Vitals  Enc Vitals Group     BP 10/18/15 0309 (!) 157/105     Pulse Rate  10/18/15 0309 (!) 111     Resp 10/18/15 0309 15     Temp 10/18/15 0309 98 F (36.7 C)     Temp Source 10/18/15 0309 Oral     SpO2 10/18/15 0309 98 %     Weight 10/18/15 0309 (!) 315 lb (142.9 kg)     Height 10/18/15 0309 5\' 1"  (1.549 m)     Head Circumference --      Peak Flow --      Pain Score 10/18/15 0322 10   Constitutional: Alert and oriented. Appears slightly uncomfortable. Eyes: Conjunctivae are normal. Normal extraocular movements. ENT   Head: Normocephalic and atraumatic.   Nose: No congestion/rhinnorhea.   Mouth/Throat: Mucous membranes are moist.   Neck: No stridor. Hematological/Lymphatic/Immunilogical: No cervical lymphadenopathy. Cardiovascular: Normal rate, regular rhythm.  No murmurs, rubs, or gallops. Chest wall slightly tender to palpation over the sternum. Respiratory: Normal respiratory effort without tachypnea nor retractions. Breath sounds are clear and equal bilaterally. No wheezes/rales/rhonchi. Gastrointestinal: Soft and nontender. No distention.  Genitourinary: Deferred Musculoskeletal: Normal range of motion in all extremities. No lower extremity edema. Neurologic:  Normal speech and language. No gross focal neurologic deficits are appreciated.  Skin:  Skin is warm, dry and intact. No rash noted. Psychiatric: Mood and affect are normal. Speech and behavior are normal. Patient exhibits appropriate insight and judgment.  ____________________________________________    LABS (pertinent positives/negatives)  Labs Reviewed  CBC - Abnormal; Notable for the following:       Result Value   Hemoglobin 11.1 (*)    HCT 34.3 (*)    MCV 73.7 (*)    MCH 23.8 (*)    RDW 16.9 (*)    All other components within normal limits  TROPONIN I - Abnormal; Notable for the following:    Troponin I 0.06 (*)    All other components within normal limits  COMPREHENSIVE METABOLIC PANEL - Abnormal; Notable for the following:    Total Protein 9.0 (*)    AST 14  (*)    ALT 11 (*)    Total Bilirubin <0.1 (*)    All other components within normal limits  GLUCOSE, CAPILLARY - Abnormal; Notable for the following:    Glucose-Capillary 117 (*)    All other components within normal limits  TROPONIN I  POC URINE PREG, ED  POCT PREGNANCY, URINE     ____________________________________________   EKG  I, Phineas Semen, attending physician, personally viewed and interpreted this EKG  EKG Time: 0307 Rate: 110 Rhythm: sinus tachycardia Axis: normal Intervals: qtc 454 QRS: narrow ST changes: no st elevation Impression: abnormal ekg  ____________________________________________    RADIOLOGY  CTA PE IMPRESSION:  1. Somewhat limited study due to timing of the contrast bolus and  body habitus. No large or central pulmonary embolus identified.  Evaluation for possible small distal emboli fairly limited on this  exam.  2. No other acute cardiopulmonary abnormality identified.  3. Mild bibasilar atelectasis.  4. Severe scoliosis.    ____________________________________________   PROCEDURES  Procedures CRITICAL CARE Performed by: Phineas Semen   Total critical care time: 30 minutes  Critical care time was exclusive of separately billable procedures and treating other patients.  Critical care was necessary to treat or prevent imminent or life-threatening deterioration.  Critical care was time spent personally by me on the following activities: development of treatment plan with patient and/or surrogate as well as nursing, discussions with consultants, evaluation of patient's response to treatment, examination of patient, obtaining history from patient or surrogate, ordering and performing treatments and interventions, ordering and review of laboratory studies, ordering and review of radiographic studies, pulse oximetry and re-evaluation of patient's condition.  ____________________________________________   INITIAL IMPRESSION /  ASSESSMENT AND PLAN / ED COURSE  Pertinent labs & imaging results that were available during my care of the patient were reviewed by me and considered in my medical decision making (see chart for details).  Patient presents to the emergency department today with chest pain. Story is somewhat concerning given pressure like pain and radiation however there is some pain to palpation of sternum and patient has history of panic attacks. Will however check blood work including troponin.  Clinical Course   Troponin was elevated to 0.06. Patient given 3 81mg  asa (had taken 1 81mg  asa prior to arrival). CT angio was obtained given tachycardia to evaluate for PE. This was negative for central clot although study was somewhat limited secondary to body habitus. Patient will be admitted to hospitalist service. ____________________________________________   FINAL CLINICAL IMPRESSION(S) / ED DIAGNOSES  Final diagnoses:  None     Note: This dictation was prepared with Dragon dictation. Any transcriptional errors that result from this process are unintentional    Phineas Semen, MD 10/18/15 [phone]

## 2015-10-18 NOTE — Progress Notes (Signed)
New pt admission from ED. Pt brought to the floor in stable condition. Vitals taken. Initial Assessment done. All immediate pertinent needs to patient addressed. Patient Guide given to patient. Important safety instructions relating to hospitalization reviewed with patient. Patient verbalized understanding. Will continue to monitor pt.  Ha Shannahan RN 

## 2015-10-18 NOTE — ED Notes (Signed)
Informed RN bed ready 

## 2015-10-18 NOTE — Progress Notes (Signed)
Pt's HR in the 130's after nebulizer treatment, pt was also c/o anxiety, MD Kalisetti made aware. Metoprolol and Alprazolam were ordered by MD. Will continue to monitor.

## 2015-10-18 NOTE — H&P (Signed)
[facility] - [place] at [facility]   PATIENT NAME: Tracey White    MR#:  [id number]  DATE OF BIRTH:  Sep 05, 1980  DATE OF ADMISSION:  10/18/2015  PRIMARY CARE PHYSICIAN: Leotis ShamesSingh,Jasmine, MD   REQUESTING/REFERRING PHYSICIAN: Dr Derrill KayGoodman  CHIEF COMPLAINT:   Chief Complaint  Patient presents with  . Chest Pain    HISTORY OF PRESENT ILLNESS:  Tracey White  is a 35 y.o. female with a known history of Depression comes in with chest pain. She states that she's had 3 spells recently where she felt like she had an anxiety attack with chest pain, shortness of breath and sweating. Last night she describes some heaviness in the chest and then pain shooting down her left arm. Severe pain in the chest and left upper chest. She sat on the toilet for about 15-20 minutes had a bowel movement then she went back to lie down. Still having some pressure this morning. Last night she also went to the grocery store had an episode where she had shortness of breath sweating and some nausea. Last night she took some aspirin. In the ER she was given aspirin. Patient was found to have a borderline troponin and hospitalist services were contacted for further evaluation. CT angiogram of the chest was negative for large PE but since the patient's body habitus and some motion degradation a distal PE could not be ruled out.  PAST MEDICAL HISTORY:   Past Medical History:  Diagnosis Date  . Anxiety   . Scoliosis     PAST SURGICAL HISTORY:   Past Surgical History:  Procedure Laterality Date  . HERNIA REPAIR    . scoliosis repair    . TUBAL LIGATION      SOCIAL HISTORY:   Social History  Substance Use Topics  . Smoking status: Current Every Day Smoker    Packs/day: 0.50    Types: Cigarettes  . Smokeless tobacco: Never Used  . Alcohol use Yes     Comment: occasionally    FAMILY HISTORY:   Family History  Problem Relation Age of Onset  . Diabetes Mother   . Hypertension Mother      DRUG ALLERGIES:   Allergies  Allergen Reactions  . Aspirin Cough         REVIEW OF SYSTEMS:  CONSTITUTIONAL: No fever, positive for sweats. No weight loss or weight gain.  EYES: No blurred or double vision.  EARS, NOSE, AND THROAT: No tinnitus or ear pain. No sore throat RESPIRATORY: No cough, positive for shortness of breath, no wheezing or hemoptysis.  CARDIOVASCULAR: Positive for chest pain, no orthopnea, edema.  GASTROINTESTINAL: Positive for nausea. No vomiting, diarrhea or abdominal pain. No blood in bowel movements GENITOURINARY: No dysuria, hematuria.  ENDOCRINE: No polyuria, nocturia,  HEMATOLOGY: No anemia, easy bruising or bleeding SKIN: No rash or lesion. MUSCULOSKELETAL: No joint pain or arthritis.   NEUROLOGIC: No tingling, numbness, weakness.  PSYCHIATRY: Positive for anxiety and depression.   MEDICATIONS AT HOME:   Prior to Admission medications   Medication Sig Start Date End Date Taking? Authorizing Provider  albuterol (PROVENTIL HFA;VENTOLIN HFA) 108 (90 Base) MCG/ACT inhaler Inhale 2 puffs into the lungs every 6 (six) hours as needed for wheezing or shortness of breath. 09/30/15  Yes Jenise V Bacon Menshew, PA-C  escitalopram (LEXAPRO) 20 MG tablet Take 20 mg by mouth daily.     Historical Provider, MD      VITAL SIGNS:  Blood pressure (!) 132/96, pulse (!) 113, temperature  98 F (36.7 C), temperature source Oral, resp. rate 15, height 5\' 1"  (1.549 m), weight (!) 142.9 kg (315 lb), last menstrual period 08/30/2015, SpO2 100 %.  PHYSICAL EXAMINATION:  GENERAL:  35 y.o.-year-old patient lying in the bed with no acute distress.  EYES: Pupils equal, round, reactive to light and accommodation. No scleral icterus. Extraocular muscles intact.  HEENT: Head atraumatic, normocephalic. Oropharynx and nasopharynx clear. Tympanic membrane on the left erythema NECK:  Supple, no jugular venous distention. No thyroid enlargement, no tenderness. Positive for  lymphadenopathy anterior cervical chain LUNGS: Decreased breath sounds bilaterally, no wheezing, rales,rhonchi or crepitation. No use of accessory muscles of respiration.  CARDIOVASCULAR: S1, S2 normal, tachycardic. No murmurs, rubs, or gallops.  ABDOMEN: Soft, nontender, nondistended. Bowel sounds present. No organomegaly or mass.  EXTREMITIES: 2+ edema, no cyanosis, or clubbing.  NEUROLOGIC: Cranial nerves II through XII are intact. Muscle strength 5/5 in all extremities. Sensation intact. Gait not checked.  PSYCHIATRIC: The patient is alert and oriented x 3.  SKIN: No rash, lesion, or ulcer.   LABORATORY PANEL:   CBC  Recent Labs Lab 10/18/15 0324  WBC 10.9  HGB 11.1*  HCT 34.3*  PLT 265   ------------------------------------------------------------------------------------------------------------------  Chemistries   Recent Labs Lab 10/18/15 0324  NA 136  K 3.9  CL 103  CO2 28  GLUCOSE 99  BUN 10  CREATININE 0.74  CALCIUM 8.9  AST 14*  ALT 11*  ALKPHOS 100  BILITOT <0.1*   ------------------------------------------------------------------------------------------------------------------  Cardiac Enzymes  Recent Labs Lab 10/18/15 0324  TROPONINI 0.06*   ------------------------------------------------------------------------------------------------------------------  RADIOLOGY:  Ct Angio Chest Pe W And/or Wo Contrast  Result Date: 10/18/2015 CLINICAL DATA:  Initial evaluation for acute chest pain. EXAM: CT ANGIOGRAPHY CHEST WITH CONTRAST TECHNIQUE: Multidetector CT imaging of the chest was performed using the standard protocol during bolus administration of intravenous contrast. Multiplanar CT image reconstructions and MIPs were obtained to evaluate the vascular anatomy. CONTRAST:  100 cc of Isovue 370. COMPARISON:  Prior radiograph from 09/30/2015. FINDINGS: Cardiovascular: Intrathoracic aorta of normal caliber without acute abnormality. Partially visualized  great vessels within normal limits. Heart size normal. No pericardial effusion. Evaluation of the pulmonary arteries mildly limited by timing the contrast bolus and body habitus. Main pulmonary artery within normal limits for size measuring 2.6 cm. The no large or central filling defect to suggest acute pulmonary embolism identified. Re-formatted imaging confirms these findings. Please note that evaluation for possible smaller segmental and subsegmental emboli somewhat limited on this exam. Mediastinum/Nodes: Visualized thyroid is normal. No pathologically enlarged mediastinal, hilar, or axillary lymph nodes identified. Esophagus within normal limits. Lungs/Pleura: Scattered bibasilar atelectatic changes present. No definite focal infiltrates. No pulmonary edema or pleural effusion. No pneumothorax. No worrisome pulmonary nodule or mass. Upper Abdomen: Visualized portions of the upper abdomen demonstrate no acute abnormality. Musculoskeletal: Severe scoliosis with sequela of prior Harrington rod fixation. No acute osseous abnormality. No worrisome lytic or blastic osseous lesions. Review of the MIP images confirms the above findings. IMPRESSION: 1. Somewhat limited study due to timing of the contrast bolus and body habitus. No large or central pulmonary embolus identified. Evaluation for possible small distal emboli fairly limited on this exam. 2. No other acute cardiopulmonary abnormality identified. 3. Mild bibasilar atelectasis. 4. Severe scoliosis. Electronically Signed   By: Rise Mu M.D.   On: 10/18/2015 05:55    EKG:   Sinus tachycardia 110 bpm  IMPRESSION AND PLAN:   1. Chest pain, shortness of breath, nausea and  diaphoresis with a borderline troponin. Observe on telemetry. Check 2 more troponins to see which way they trend. Patient already received aspirin in the emergency room. Chest pain is reproducible going along with the costochondritis. Give IV Solu-Medrol and Levaquin and  nebulizer treatments 2. Borderline troponin serial troponins. Depending on the next troponin will decide if cardiology consultation is needed or not. 3. Obesity. Weight loss needed 4. Anxiety depression on Lexapro 5. Tobacco abuse smoking cessation counseling done 3 minutes by me. Since the patient only smokes 4 cigarettes a day no need for nicotine patch. 6. Ultrasound the lower extremities  All the records are reviewed and case discussed with ED provider. Management plans discussed with the patient, family and they are in agreement.  CODE STATUS: Full code  TOTAL TIME TAKING CARE OF THIS PATIENT: 50 minutes.    Alford Highland M.D on 10/18/2015 at 8:01 AM  Between 7am to 6pm - Pager - [phone]  After 6pm call admission pager [phone]  [facility] Office  [phone]  CC: Primary care physician; Leotis Shames, MD

## 2015-10-18 NOTE — ED Notes (Addendum)
Patient c/o sudden onset of 10/10 CP. EKG completed and handed to Dr. Huel CoteQuigley. EKG was unremarkable. Dr. Renae GlossWieting made aware.

## 2015-10-18 NOTE — ED Notes (Signed)
Pt given sprite, no other needs expressed.

## 2015-10-18 NOTE — ED Notes (Signed)
Pt returned from CT via stretcher.

## 2015-10-18 NOTE — Care Management (Signed)
Admitted to [facility] with the diagnosis of chest pain under observation status. Lives with sister Magda Paganiniudrey [phone]) and daughter. Sees Dr. Thedore MinsSingh as primary care physician, next appointment is scheduled for 10/19/15. Works at [employer]. States she is out of work at now because she hurt her arm.  Prescriptions are filled at [facility] in [place]. Takes care of all basic and instrumental activities of daily living herself, drives. No falls. Good Appetite. No home oxygen. Sister will transport. Gwenette GreetBrenda S Keontay Vora RN MSN CCM Care Management [phone]

## 2015-10-18 NOTE — ED Notes (Signed)
Pt asleep and snoring, vitals WDL. Friend at bedside.

## 2015-10-19 ENCOUNTER — Observation Stay
Admit: 2015-10-19 | Discharge: 2015-10-19 | Disposition: A | Discharge status: Home / Self Care | Payer: 59 | Attending: Internal Medicine | Admitting: Internal Medicine

## 2015-10-19 LAB — ECHOCARDIOGRAM COMPLETE
HEIGHTINCHES: 61 in
WEIGHTICAEL: 5040 [oz_av]

## 2015-10-19 LAB — BASIC METABOLIC PANEL
ANION GAP: 5 (ref 5–15)
BUN: 12 mg/dL (ref 6–20)
CO2: 26 mmol/L (ref 22–32)
Calcium: 9 mg/dL (ref 8.9–10.3)
Chloride: 103 mmol/L (ref 101–111)
Creatinine, Ser: 0.79 mg/dL (ref 0.44–1.00)
GFR calc Af Amer: >60 mL/min (ref >60)
GLUCOSE: 149 mg/dL — AB (ref 65–99)
POTASSIUM: 4.1 mmol/L (ref 3.5–5.1)
Sodium: 134 mmol/L — ABNORMAL LOW (ref 135–145)

## 2015-10-19 LAB — CBC
HEMATOCRIT: 33.9 % — AB (ref 35.0–47.0)
HEMOGLOBIN: 10.7 g/dL — AB (ref 12.0–16.0)
MCH: 23.6 pg — AB (ref 26.0–34.0)
MCHC: 31.6 g/dL — AB (ref 32.0–36.0)
MCV: 74.6 fL — AB (ref 80.0–100.0)
Platelets: 284 10*3/uL (ref 150–440)
RBC: 4.55 MIL/uL (ref 3.80–5.20)
RDW: 17 % — AB (ref 11.5–14.5)
WBC: 11.3 10*3/uL — ABNORMAL HIGH (ref 3.6–11.0)

## 2015-10-19 MED ORDER — SENNOSIDES-DOCUSATE SODIUM 8.6-50 MG PO TABS
1.0000 | ORAL_TABLET | Freq: Every day | ORAL | Status: DC
  Administered 2015-10-19: 1 via ORAL
  Filled 2015-10-19: qty 1

## 2015-10-19 MED ORDER — FUROSEMIDE 10 MG/ML IJ SOLN
40.0000 mg | Freq: Once | INTRAMUSCULAR | Status: AC
  Administered 2015-10-19: 40 mg via INTRAVENOUS
  Filled 2015-10-19: qty 4

## 2015-10-19 MED ORDER — OXYCODONE HCL 5 MG PO TABS: 5.0000 mg | ORAL_TABLET | ORAL | Status: DC | PRN

## 2015-10-19 MED ORDER — METHYLPREDNISOLONE SODIUM SUCC 40 MG IJ SOLR
40.0000 mg | Freq: Four times a day (QID) | INTRAMUSCULAR | Status: DC
  Administered 2015-10-19 – 2015-10-20 (×3): 40 mg via INTRAVENOUS
  Filled 2015-10-19 (×3): qty 1

## 2015-10-19 NOTE — Progress Notes (Signed)
*  PRELIMINARY RESULTS* Echocardiogram 2D Echocardiogram has been performed.  Cristela BlueHege, Bruk Tumolo 10/19/2015, 10:51 AM

## 2015-10-19 NOTE — Progress Notes (Signed)
Patient ID: Tracey White Carr[place], female   DOB: 1981-01-02, 35 y.o.   MRN: [id number]  [facility] PROGRESS NOTE  Tracey White [name]:[id number] DOB: 1981-01-02 DOA: 10/18/2015 PCP: Leotis ShamesSingh,Jasmine, MD  HPI/Subjective: Patient states that she still short of breath with walking around. She still having pains in the chest and down her left arm. She is very concerned that if she goes home somethings going to happen.  Objective: Vitals:   10/19/15 0607 10/19/15 1118  BP: 127/83 111/81  Pulse: 95 (!) 113  Resp:  18  Temp: 97.7 F (36.5 C) 98.1 F (36.7 C)    Filed Weights   10/18/15 0309  Weight: (!) 142.9 kg (315 lb)    ROS: Review of Systems  Constitutional: Negative for chills and fever.  Eyes: Negative for blurred vision.  Respiratory: Positive for shortness of breath. Negative for cough.   Cardiovascular: Positive for chest pain.  Gastrointestinal: Negative for abdominal pain, constipation, diarrhea, nausea and vomiting.  Genitourinary: Negative for dysuria.  Musculoskeletal: Positive for joint pain.  Neurological: Negative for dizziness and headaches.   Exam: Physical Exam  Constitutional: She is oriented to person, place, and time.  HENT:  Nose: No mucosal edema.  Mouth/Throat: No oropharyngeal exudate or posterior oropharyngeal edema.  Eyes: Conjunctivae, EOM and lids are normal. Pupils are equal, round, and reactive to light.  Neck: No JVD present. Carotid bruit is not present. No edema present. No thyroid mass and no thyromegaly present.  Cardiovascular: Regular rhythm, S1 normal and S2 normal.  Exam reveals no gallop.   No murmur heard. Pulses:      Dorsalis pedis pulses are 2+ on the right side, and 2+ on the left side.  Respiratory: No respiratory distress. She has decreased breath sounds in the right lower field and the left lower field. She has no wheezes. She has no rhonchi. She has no rales.  GI: Soft. Bowel sounds are normal. There is no tenderness.   Musculoskeletal:       Right ankle: She exhibits swelling.       Left ankle: She exhibits swelling.  Lymphadenopathy:    She has no cervical adenopathy.  Neurological: She is alert and oriented to person, place, and time. No cranial nerve deficit.  Skin: Skin is warm. No rash noted. Nails show no clubbing.  Psychiatric: Her mood appears anxious.      Data Reviewed: Basic Metabolic Panel:  Recent Labs Lab 10/18/15 0324 10/19/15 0454  NA 136 134*  K 3.9 4.1  CL 103 103  CO2 28 26  GLUCOSE 99 149*  BUN 10 12  CREATININE 0.74 0.79  CALCIUM 8.9 9.0   Liver Function Tests:  Recent Labs Lab 10/18/15 0324  AST 14*  ALT 11*  ALKPHOS 100  BILITOT <0.1*  PROT 9.0*  ALBUMIN 3.6   CBC:  Recent Labs Lab 10/18/15 0324 10/19/15 0454  WBC 10.9 11.3*  HGB 11.1* 10.7*  HCT 34.3* 33.9*  MCV 73.7* 74.6*  PLT 265 284   Cardiac Enzymes:  Recent Labs Lab 10/18/15 0324 10/18/15 0730 10/18/15 1137 10/18/15 1548  TROPONINI 0.06* <0.03 <0.03 <0.03   BNP (last 3 results)  Recent Labs  12/19/14 1819  BNP 6.0    CBG:  Recent Labs Lab 10/18/15 0452  GLUCAP 117*    Studies: Ct Angio Chest Pe W And/or Wo Contrast  Result Date: 10/18/2015 CLINICAL DATA:  Initial evaluation for acute chest pain. EXAM: CT ANGIOGRAPHY CHEST WITH CONTRAST TECHNIQUE: Multidetector CT imaging of  the chest was performed using the standard protocol during bolus administration of intravenous contrast. Multiplanar CT image reconstructions and MIPs were obtained to evaluate the vascular anatomy. CONTRAST:  100 cc of Isovue 370. COMPARISON:  Prior radiograph from 09/30/2015. FINDINGS: Cardiovascular: Intrathoracic aorta of normal caliber without acute abnormality. Partially visualized great vessels within normal limits. Heart size normal. No pericardial effusion. Evaluation of the pulmonary arteries mildly limited by timing the contrast bolus and body habitus. Main pulmonary artery within normal  limits for size measuring 2.6 cm. The no large or central filling defect to suggest acute pulmonary embolism identified. Re-formatted imaging confirms these findings. Please note that evaluation for possible smaller segmental and subsegmental emboli somewhat limited on this exam. Mediastinum/Nodes: Visualized thyroid is normal. No pathologically enlarged mediastinal, hilar, or axillary lymph nodes identified. Esophagus within normal limits. Lungs/Pleura: Scattered bibasilar atelectatic changes present. No definite focal infiltrates. No pulmonary edema or pleural effusion. No pneumothorax. No worrisome pulmonary nodule or mass. Upper Abdomen: Visualized portions of the upper abdomen demonstrate no acute abnormality. Musculoskeletal: Severe scoliosis with sequela of prior Harrington rod fixation. No acute osseous abnormality. No worrisome lytic or blastic osseous lesions. Review of the MIP images confirms the above findings. IMPRESSION: 1. Somewhat limited study due to timing of the contrast bolus and body habitus. No large or central pulmonary embolus identified. Evaluation for possible small distal emboli fairly limited on this exam. 2. No other acute cardiopulmonary abnormality identified. 3. Mild bibasilar atelectasis. 4. Severe scoliosis. Electronically Signed   By: Rise Mu M.D.   On: 10/18/2015 05:55   US Venous Img Lower Bilateral  Result Date: 10/18/2015 CLINICAL DATA:  Bilateral lower extremity pain for 3 days EXAM: BILATERAL LOWER EXTREMITY VENOUS DOPPLER ULTRASOUND TECHNIQUE: Gray-scale sonography with graded compression, as well as color Doppler and duplex ultrasound were performed to evaluate the lower extremity deep venous systems from the level of the common femoral vein and including the common femoral, femoral, profunda femoral, popliteal and calf veins including the posterior tibial, peroneal and gastrocnemius veins when visible. The superficial great saphenous vein was also  interrogated. Spectral Doppler was utilized to evaluate flow at rest and with distal augmentation maneuvers in the common femoral, femoral and popliteal veins. COMPARISON:  None. FINDINGS: RIGHT LOWER EXTREMITY Common Femoral Vein: No evidence of thrombus. Normal compressibility, respiratory phasicity and response to augmentation. Saphenofemoral Junction: No evidence of thrombus. Normal compressibility and flow on color Doppler imaging. Profunda Femoral Vein: No evidence of thrombus. Normal compressibility and flow on color Doppler imaging. Femoral Vein: No evidence of thrombus. Normal compressibility, respiratory phasicity and response to augmentation. Popliteal Vein: No evidence of thrombus. Normal compressibility, respiratory phasicity and response to augmentation. Calf Veins: Posterior tibial veins are visualized, compressible and appear patent. Peroneal veins not visualized. Superficial Great Saphenous Vein: No evidence of thrombus. Normal compressibility and flow on color Doppler imaging. Venous Reflux:  None. Other Findings:  None. LEFT LOWER EXTREMITY Common Femoral Vein: No evidence of thrombus. Normal compressibility, respiratory phasicity and response to augmentation. Saphenofemoral Junction: No evidence of thrombus. Normal compressibility and flow on color Doppler imaging. Profunda Femoral Vein: No evidence of thrombus. Normal compressibility and flow on color Doppler imaging. Femoral Vein: No evidence of thrombus. Normal compressibility, respiratory phasicity and response to augmentation. Popliteal Vein: No evidence of thrombus. Normal compressibility, respiratory phasicity and response to augmentation. Calf Veins: Posterior tibial veins are visualized, compressible and appear patent. Peroneal veins not visualized. Superficial Great Saphenous Vein: No evidence of thrombus. Normal  compressibility and flow on color Doppler imaging. Venous Reflux:  None. Other Findings:  None. IMPRESSION: No significant  occlusive femoral popliteal DVT in either extremity. Limited assessment of the calf veins because of body habitus. Electronically Signed   By: Judie Petit.  Shick M.D.   On: 10/18/2015 17:09    Scheduled Meds: . enoxaparin (LOVENOX) injection  40 mg Subcutaneous BID  . escitalopram  20 mg Oral Daily  . ipratropium-albuterol  3 mL Nebulization Q6H  . levofloxacin  750 mg Oral Daily  . methylPREDNISolone (SOLU-MEDROL) injection  40 mg Intravenous Q6H  . sodium chloride flush  3 mL Intravenous Q12H    Assessment/Plan:  1. Chest pain and shortness of breath. I think the chest pain is reproducible in nature. Continue Solu-Medrol. The patient has an upper respiratory tract infection and bronchitis and Levaquin will cover that. She does definitely have an otitis media. CT scan of the chest ruled out major pulmonary embolism and the patient does not have an aortic dissection. Cardiac enzymes were negative 2 after the first borderline one. This is not White heart attack. I tried to reassure the patient but she still very nervous about going home. She gets short of breath with walking. Continue to monitor again overnight. 2. Costochondritis. IV Solu-Medrol 3. Otitis media and upper respiratory tract infection continue Levaquin 4. Anxiety and depression. I asked the nurse to give Xanax and see what happens. Continue Lexapro  Code Status:     Code Status Orders        Start     Ordered   10/18/15 0757  Full code  Continuous     10/18/15 0756    Code Status History    Date Active Date Inactive Code Status Order ID Comments User Context   10/18/2015  7:56 AM 10/19/2015  8:36 AM Full Code [id number]  Alford Highland, MD ED     Disposition Plan: Reevaluate tomorrow for disposition  Antibiotics:  Levaquin  Time spent: 25 minutes  Alford Highland  [facility]

## 2015-10-20 MED ORDER — PREDNISONE 20 MG PO TABS
40.0000 mg | ORAL_TABLET | Freq: Every day | ORAL | Status: DC
  Administered 2015-10-20: 40 mg via ORAL
  Filled 2015-10-20: qty 2

## 2015-10-20 MED ORDER — PREDNISONE 5 MG PO TABS: ORAL_TABLET | ORAL | 0 refills | Status: DC

## 2015-10-20 MED ORDER — ALBUTEROL SULFATE HFA 108 (90 BASE) MCG/ACT IN AERS: 2.0000 | INHALATION_SPRAY | Freq: Four times a day (QID) | RESPIRATORY_TRACT | 0 refills | Status: DC | PRN

## 2015-10-20 MED ORDER — INFLUENZA VAC SPLIT QUAD 0.5 ML IM SUSY
0.5000 mL | PREFILLED_SYRINGE | INTRAMUSCULAR | Status: AC | PRN
  Administered 2015-10-20: 0.5 mL via INTRAMUSCULAR
  Filled 2015-10-20: qty 0.5

## 2015-10-20 MED ORDER — LEVOFLOXACIN 750 MG PO TABS: 750.0000 mg | ORAL_TABLET | Freq: Every day | ORAL | 0 refills | Status: DC

## 2015-10-20 MED ORDER — BECLOMETHASONE DIPROPIONATE 40 MCG/ACT IN AERS: 1.0000 | INHALATION_SPRAY | Freq: Two times a day (BID) | RESPIRATORY_TRACT | 0 refills | Status: DC

## 2015-10-20 NOTE — Progress Notes (Signed)
Patient ID: Tracey White, female   DOB: 1980/07/02, 35 y.o.   MRN: [id number] [facility] - [place] at [facility]        Tracey White was admitted to the Hospital on 10/18/2015 and Discharged  10/20/2015 and should be excused from work/school   for 4 days starting 10/18/2015 , may return to work/school without any restrictions.  Alford HighlandWIETING, Muneeb Veras M.D on 10/20/2015,at 9:46 AM  [facility] - [place] at [facility]    Office  [phone]

## 2015-10-20 NOTE — Discharge Summary (Signed)
[employer] - [place] at [facility]   PATIENT NAME: Tracey White    MR#:  [id number]  DATE OF BIRTH:  1980-10-23  DATE OF ADMISSION:  10/18/2015 ADMITTING PHYSICIAN: Alford Highland, MD  DATE OF DISCHARGE: 10/20/2015 11:21 AM  PRIMARY CARE PHYSICIAN: Singh,Jasmine, MD    ADMISSION DIAGNOSIS:  Elevated troponin [R79.89] Chest pain, unspecified chest pain type [R07.9]  DISCHARGE DIAGNOSIS:  Active Problems:   Chest pain at rest   SECONDARY DIAGNOSIS:   Past Medical History:  Diagnosis Date  . Anxiety   . Scoliosis     HOSPITAL COURSE:   1.  Chest pain and shortness of breath. The patient's chest pain was reproducible in nature. The patient had a borderline cardiac enzyme on the first enzyme and that's why hospitalists were called for admission. The next 2 troponins were negative. I started Solu-Medrol and Levaquin for costochondritis and upper respiratory tract infection and otitis media. It took a few days for the patient to feel better. Upon discharge she was feeling much better. Continue prednisone taper and Levaquin upon discharge home. The patient had a CT scan of the chest that was negative for pulmonary embolism. Because of her weight being elevated over 300 I did not order a stress test because it would likely be a false positive because of her weight and breast attenuation. Patient's chest pain improved with the steroids. 2. Anxiety on Lexapro 3. Obesity. Weight loss needed. Patient interested in potentially gastric banding procedure. Can get referral from her medical doctor.  DISCHARGE CONDITIONS:   Satisfactory  CONSULTS OBTAINED:   none  DRUG ALLERGIES:   Allergies  Allergen Reactions  . Aspirin Cough         DISCHARGE MEDICATIONS:   Discharge Medication List as of 10/20/2015 10:04 AM    START taking these medications   Details  beclomethasone (QVAR) 40 MCG/ACT inhaler Inhale 1 puff into the lungs 2 (two) times daily., Starting Fri  10/20/2015, Print    levofloxacin (LEVAQUIN) 750 MG tablet Take 1 tablet (750 mg total) by mouth daily., Starting Sat 10/21/2015, Print    predniSONE (DELTASONE) 5 MG tablet 6 tabs po day1; 5 tabs po day2; 4 tabs po day3; 3 tabs po day4; 2 tab po day 5; 1 tab po day6, Print      CONTINUE these medications which have CHANGED   Details  albuterol (PROVENTIL HFA;VENTOLIN HFA) 108 (90 Base) MCG/ACT inhaler Inhale 2 puffs into the lungs every 6 (six) hours as needed for wheezing or shortness of breath., Starting Fri 10/20/2015, Print      CONTINUE these medications which have NOT CHANGED   Details  escitalopram (LEXAPRO) 20 MG tablet Take 20 mg by mouth daily. , Historical Med         DISCHARGE INSTRUCTIONS:   Follow-up medical doctor one week  If you experience worsening of your admission symptoms, develop shortness of breath, life threatening emergency, suicidal or homicidal thoughts you must seek medical attention immediately by calling 911 or calling your MD immediately  if symptoms less severe.  You Must read complete instructions/literature along with all the possible adverse reactions/side effects for all the Medicines you take and that have been prescribed to you. Take any new Medicines after you have completely understood and accept all the possible adverse reactions/side effects.   Please note  You were cared for by a hospitalist during your hospital stay. If you have any questions about your discharge medications or the care you received while  you were in the hospital after you are discharged, you can call the unit and asked to speak with the hospitalist on call if the hospitalist that took care of you is not available. Once you are discharged, your primary care physician will handle any further medical issues. Please note that NO REFILLS for any discharge medications will be authorized once you are discharged, as it is imperative that you return to your primary care physician (or  establish a relationship with a primary care physician if you do not have one) for your aftercare needs so that they can reassess your need for medications and monitor your lab values.    Today   CHIEF COMPLAINT:   Chief Complaint  Patient presents with  . Chest Pain    HISTORY OF PRESENT ILLNESS:  Tracey PattyRose White  is a 35 y.o. female admitted with chest pain   VITAL SIGNS:  Blood pressure 131/86, pulse (!) 106, temperature 97.9 F (36.6 C), temperature source Oral, resp. rate 18, height 5\' 1"  (1.549 m), weight (!) 146.4 kg (322 lb 11.2 oz), last menstrual period 08/30/2015, SpO2 95 %.    PHYSICAL EXAMINATION:  GENERAL:  35 y.o.-year-old patient lying in the bed with no acute distress.  EYES: Pupils equal, round, reactive to light and accommodation. No scleral icterus. Extraocular muscles intact.  HEENT: Head atraumatic, normocephalic. Oropharynx and nasopharynx clear.  NECK:  Supple, no jugular venous distention. No thyroid enlargement, no tenderness.  LUNGS: Normal breath sounds bilaterally, no wheezing, rales,rhonchi or crepitation. No use of accessory muscles of respiration.  CARDIOVASCULAR: S1, S2 normal. No murmurs, rubs, or gallops.  ABDOMEN: Soft, non-tender, non-distended. Bowel sounds present. No organomegaly or mass.  EXTREMITIES: Trace pedal edema, no cyanosis, or clubbing.  NEUROLOGIC: Cranial nerves II through XII are intact. Muscle strength 5/5 in all extremities. Sensation intact. Gait not checked.  PSYCHIATRIC: The patient is alert and oriented x 3.  SKIN: No obvious rash, lesion, or ulcer.   DATA REVIEW:   CBC  Recent Labs Lab 10/19/15 0454  WBC 11.3*  HGB 10.7*  HCT 33.9*  PLT 284    Chemistries   Recent Labs Lab 10/18/15 0324 10/19/15 0454  NA 136 134*  K 3.9 4.1  CL 103 103  CO2 28 26  GLUCOSE 99 149*  BUN 10 12  CREATININE 0.74 0.79  CALCIUM 8.9 9.0  AST 14*  --   ALT 11*  --   ALKPHOS 100  --   BILITOT <0.1*  --     Cardiac  Enzymes  Recent Labs Lab 10/18/15 1548  TROPONINI <0.03     RADIOLOGY:  [place] Venous Img Lower Bilateral  Result Date: 10/18/2015 CLINICAL DATA:  Bilateral lower extremity pain for 3 days EXAM: BILATERAL LOWER EXTREMITY VENOUS DOPPLER ULTRASOUND TECHNIQUE: Gray-scale sonography with graded compression, as well as color Doppler and duplex ultrasound were performed to evaluate the lower extremity deep venous systems from the level of the common femoral vein and including the common femoral, femoral, profunda femoral, popliteal and calf veins including the posterior tibial, peroneal and gastrocnemius veins when visible. The superficial great saphenous vein was also interrogated. Spectral Doppler was utilized to evaluate flow at rest and with distal augmentation maneuvers in the common femoral, femoral and popliteal veins. COMPARISON:  None. FINDINGS: RIGHT LOWER EXTREMITY Common Femoral Vein: No evidence of thrombus. Normal compressibility, respiratory phasicity and response to augmentation. Saphenofemoral Junction: No evidence of thrombus. Normal compressibility and flow on color Doppler imaging. Profunda Femoral Vein:  No evidence of thrombus. Normal compressibility and flow on color Doppler imaging. Femoral Vein: No evidence of thrombus. Normal compressibility, respiratory phasicity and response to augmentation. Popliteal Vein: No evidence of thrombus. Normal compressibility, respiratory phasicity and response to augmentation. Calf Veins: Posterior tibial veins are visualized, compressible and appear patent. Peroneal veins not visualized. Superficial Great Saphenous Vein: No evidence of thrombus. Normal compressibility and flow on color Doppler imaging. Venous Reflux:  None. Other Findings:  None. LEFT LOWER EXTREMITY Common Femoral Vein: No evidence of thrombus. Normal compressibility, respiratory phasicity and response to augmentation. Saphenofemoral Junction: No evidence of thrombus. Normal  compressibility and flow on color Doppler imaging. Profunda Femoral Vein: No evidence of thrombus. Normal compressibility and flow on color Doppler imaging. Femoral Vein: No evidence of thrombus. Normal compressibility, respiratory phasicity and response to augmentation. Popliteal Vein: No evidence of thrombus. Normal compressibility, respiratory phasicity and response to augmentation. Calf Veins: Posterior tibial veins are visualized, compressible and appear patent. Peroneal veins not visualized. Superficial Great Saphenous Vein: No evidence of thrombus. Normal compressibility and flow on color Doppler imaging. Venous Reflux:  None. Other Findings:  None. IMPRESSION: No significant occlusive femoral popliteal DVT in either extremity. Limited assessment of the calf veins because of body habitus. Electronically Signed   By: Judie Petit.  Shick M.D.   On: 10/18/2015 17:09    Management plans discussed with the patient, family and she is in agreement.  CODE STATUS:  Code Status History    Date Active Date Inactive Code Status Order ID Comments User Context   10/18/2015  7:56 AM 10/19/2015  8:36 AM Full Code [id number]  Alford Highland, MD ED      TOTAL TIME TAKING CARE OF THIS PATIENT: 32 minutes.    Alford Highland M.D on 10/20/2015 at 4:02 PM  Between 7am to 6pm - Pager - [phone]  After 6pm go to [email or web address] - password [email or web address]  [employer] Office  [phone]  CC: Primary care physician; Leotis Shames, MD

## 2015-10-20 NOTE — Progress Notes (Signed)
SATURATION QUALIFICATIONS: (This note is used to comply with regulatory documentation for home oxygen)  Patient Saturations on Room Air at Rest = 97%  Patient Saturations on Room Air while Ambulating = 93%  Patient Saturations on 0 Liters of oxygen while Ambulating = 93%  Please briefly explain why patient needs home oxygen:  Does not qualify for Home O2

## 2015-10-20 NOTE — Discharge Instructions (Signed)
Chest Wall Pain °Chest wall pain is pain in or around the bones and muscles of your chest. Sometimes, an injury causes this pain. Sometimes, the cause may not be known. This pain may take several weeks or longer to get better. °HOME CARE °Pay attention to any changes in your symptoms. Take these actions to help with your pain: °· Rest as told by your doctor. °· Avoid activities that cause pain. Try not to use your chest, belly (abdominal), or side muscles to lift heavy things. °· If directed, apply ice to the painful area: °¨ Put ice in a plastic bag. °¨ Place a towel between your skin and the bag. °¨ Leave the ice on for 20 minutes, 2-3 times per day. °· Take over-the-counter and prescription medicines only as told by your doctor. °· Do not use tobacco products, including cigarettes, chewing tobacco, and e-cigarettes. If you need help quitting, ask your doctor. °· Keep all follow-up visits as told by your doctor. This is important. °GET HELP IF: °· You have a fever. °· Your chest pain gets worse. °· You have new symptoms. °GET HELP RIGHT AWAY IF: °· You feel sick to your stomach (nauseous) or you throw up (vomit). °· You feel sweaty or light-headed. °· You have a cough with phlegm (sputum) or you cough up blood. °· You are short of breath. °  °This information is not intended to replace advice given to you by your health care provider. Make sure you discuss any questions you have with your health care provider. °  °Document Released: 07/03/2007 Document Revised: 10/05/2014 Document Reviewed: 04/11/2014 °Elsevier Interactive Patient Education ©2016 [employer] ° °

## 2015-10-20 NOTE — Progress Notes (Signed)
Patient escorted out of hospital via wheelchair by volunteers.  

## 2015-10-20 NOTE — Progress Notes (Signed)
Removed telemetry and PIV.  Given rx. F/U appt with Dr. Thedore MinsSingh.  No questions at this time.  Will give flu shot prior to discharge.  Patient waiting on family to come pick them up.

## 2015-10-20 NOTE — Care Management (Signed)
Discharge order is present.  Requesting documentation of 02 sat assessment prior to discahrge

## 2015-10-31 DIAGNOSIS — R000 Tachycardia, unspecified: Secondary | ICD-10-CM | POA: Insufficient documentation

## 2015-11-01 ENCOUNTER — Other Ambulatory Visit: Payer: Self-pay | Admitting: Internal Medicine

## 2015-11-01 DIAGNOSIS — R0602 Shortness of breath: Secondary | ICD-10-CM

## 2015-11-01 DIAGNOSIS — R079 Chest pain, unspecified: Secondary | ICD-10-CM

## 2015-11-08 ENCOUNTER — Ambulatory Visit
Admission: RE | Admit: 2015-11-08 | Discharge: 2015-11-08 | Disposition: A | Discharge status: Home / Self Care | Payer: 59 | Source: Ambulatory Visit | Attending: Internal Medicine | Admitting: Internal Medicine

## 2015-11-08 DIAGNOSIS — I209 Angina pectoris, unspecified: Secondary | ICD-10-CM | POA: Diagnosis not present

## 2015-11-08 DIAGNOSIS — R0602 Shortness of breath: Secondary | ICD-10-CM | POA: Diagnosis present

## 2015-11-08 DIAGNOSIS — R079 Chest pain, unspecified: Secondary | ICD-10-CM

## 2015-11-08 MED ORDER — TECHNETIUM TC 99M TETROFOSMIN IV KIT
30.8800 | PACK | Freq: Once | INTRAVENOUS | Status: AC | PRN
  Administered 2015-11-08: 30.88 via INTRAVENOUS

## 2015-11-08 MED ORDER — REGADENOSON 0.4 MG/5ML IV SOLN
0.4000 mg | Freq: Once | INTRAVENOUS | Status: AC
  Administered 2015-11-08: 0.4 mg via INTRAVENOUS

## 2015-11-09 ENCOUNTER — Encounter
Admission: RE | Admit: 2015-11-09 | Discharge: 2015-11-09 | Disposition: A | Discharge status: Home / Self Care | Payer: 59 | Source: Ambulatory Visit | Attending: Internal Medicine | Admitting: Internal Medicine

## 2015-11-09 DIAGNOSIS — R0602 Shortness of breath: Secondary | ICD-10-CM | POA: Diagnosis not present

## 2015-11-09 MED ORDER — TECHNETIUM TC 99M TETROFOSMIN IV KIT
29.8700 | PACK | Freq: Once | INTRAVENOUS | Status: AC | PRN
  Administered 2015-11-09: 29.87 via INTRAVENOUS

## 2015-11-09 MED ORDER — TECHNETIUM TC 99M TETROFOSMIN IV KIT: 30.8800 | PACK | Freq: Once | INTRAVENOUS | Status: AC | PRN

## 2015-11-14 ENCOUNTER — Emergency Department: Payer: 59

## 2015-11-14 ENCOUNTER — Emergency Department
Admission: EM | Admit: 2015-11-14 | Discharge: 2015-11-14 | Disposition: A | Discharge status: Home / Self Care | Payer: 59 | Attending: Emergency Medicine | Admitting: Emergency Medicine

## 2015-11-14 DIAGNOSIS — F17210 Nicotine dependence, cigarettes, uncomplicated: Secondary | ICD-10-CM | POA: Diagnosis not present

## 2015-11-14 DIAGNOSIS — R609 Edema, unspecified: Secondary | ICD-10-CM

## 2015-11-14 DIAGNOSIS — R0789 Other chest pain: Secondary | ICD-10-CM | POA: Diagnosis not present

## 2015-11-14 DIAGNOSIS — Z79899 Other long term (current) drug therapy: Secondary | ICD-10-CM | POA: Diagnosis not present

## 2015-11-14 DIAGNOSIS — R079 Chest pain, unspecified: Secondary | ICD-10-CM

## 2015-11-14 DIAGNOSIS — R0602 Shortness of breath: Secondary | ICD-10-CM | POA: Diagnosis present

## 2015-11-14 LAB — CBC
HCT: 34.3 % — ABNORMAL LOW (ref 35.0–47.0)
HEMOGLOBIN: 10.9 g/dL — AB (ref 12.0–16.0)
MCH: 23.9 pg — ABNORMAL LOW (ref 26.0–34.0)
MCHC: 31.8 g/dL — AB (ref 32.0–36.0)
MCV: 75.1 fL — ABNORMAL LOW (ref 80.0–100.0)
Platelets: 275 10*3/uL (ref 150–440)
RBC: 4.57 MIL/uL (ref 3.80–5.20)
RDW: 17.6 % — ABNORMAL HIGH (ref 11.5–14.5)
WBC: 8.2 10*3/uL (ref 3.6–11.0)

## 2015-11-14 LAB — BASIC METABOLIC PANEL
ANION GAP: 6 (ref 5–15)
BUN: 11 mg/dL (ref 6–20)
CALCIUM: 9 mg/dL (ref 8.9–10.3)
CO2: 28 mmol/L (ref 22–32)
Chloride: 103 mmol/L (ref 101–111)
Creatinine, Ser: 0.71 mg/dL (ref 0.44–1.00)
Glucose, Bld: 127 mg/dL — ABNORMAL HIGH (ref 65–99)
Potassium: 3.6 mmol/L (ref 3.5–5.1)
SODIUM: 137 mmol/L (ref 135–145)

## 2015-11-14 LAB — TROPONIN I

## 2015-11-14 LAB — BRAIN NATRIURETIC PEPTIDE: B NATRIURETIC PEPTIDE 5: 10 pg/mL (ref 0.0–100.0)

## 2015-11-14 IMAGING — CR DG CHEST 2V
3 series · 4 of 4 positions shown · non-contrast
Comparison: [DATE].

CLINICAL DATA: Bilateral lower extremity swelling.

EXAM:
CHEST  2 VIEW

[Series 1: dg chest 2 view · 0.14mm/px · 2 of 2 slices shown]
[im 1/2]
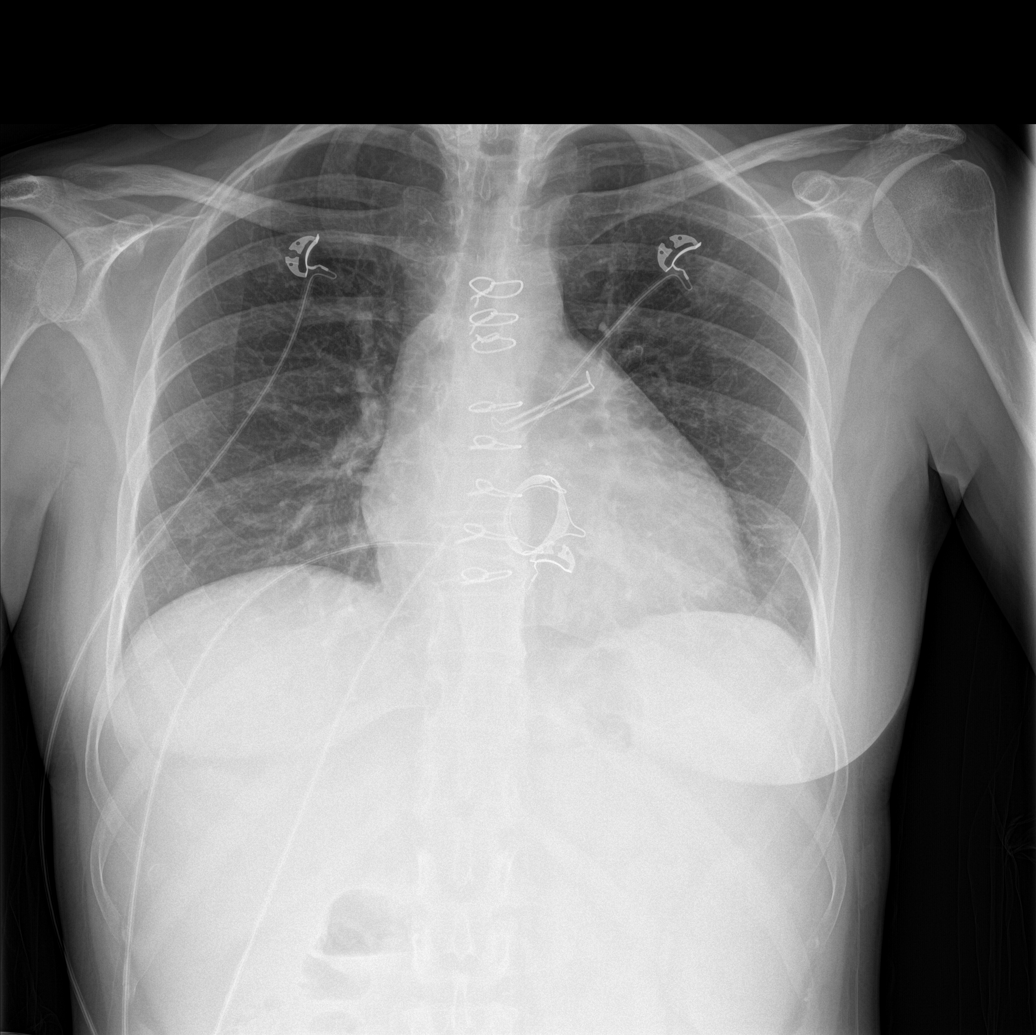
[im 2/2]
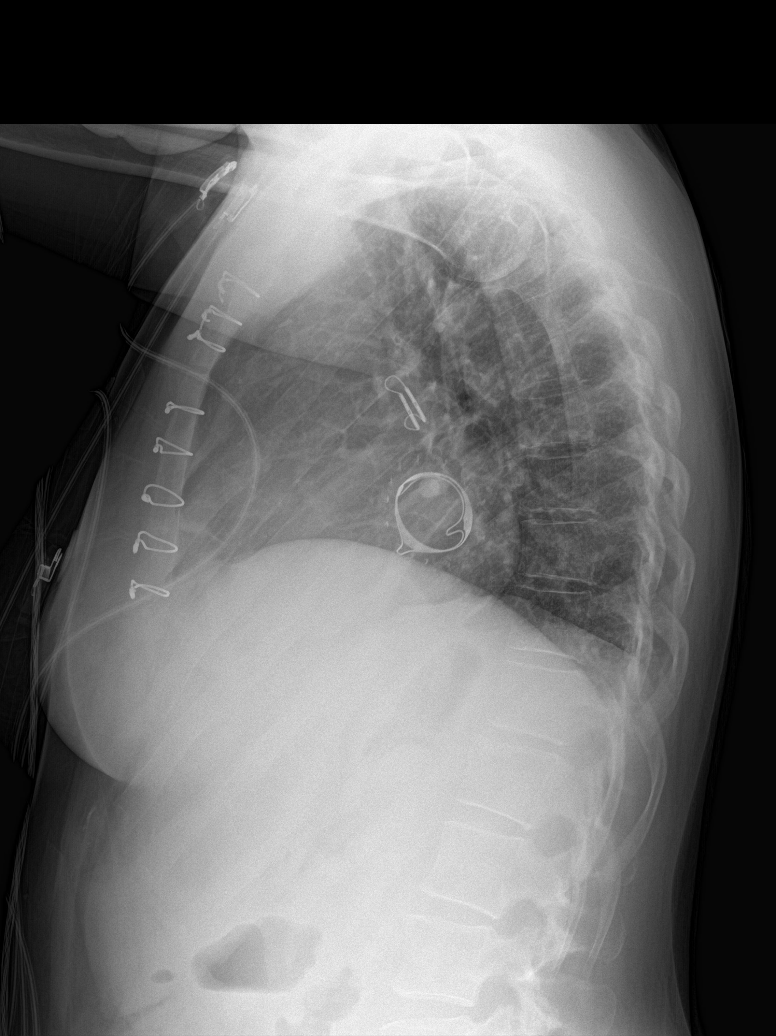

[chest pa]
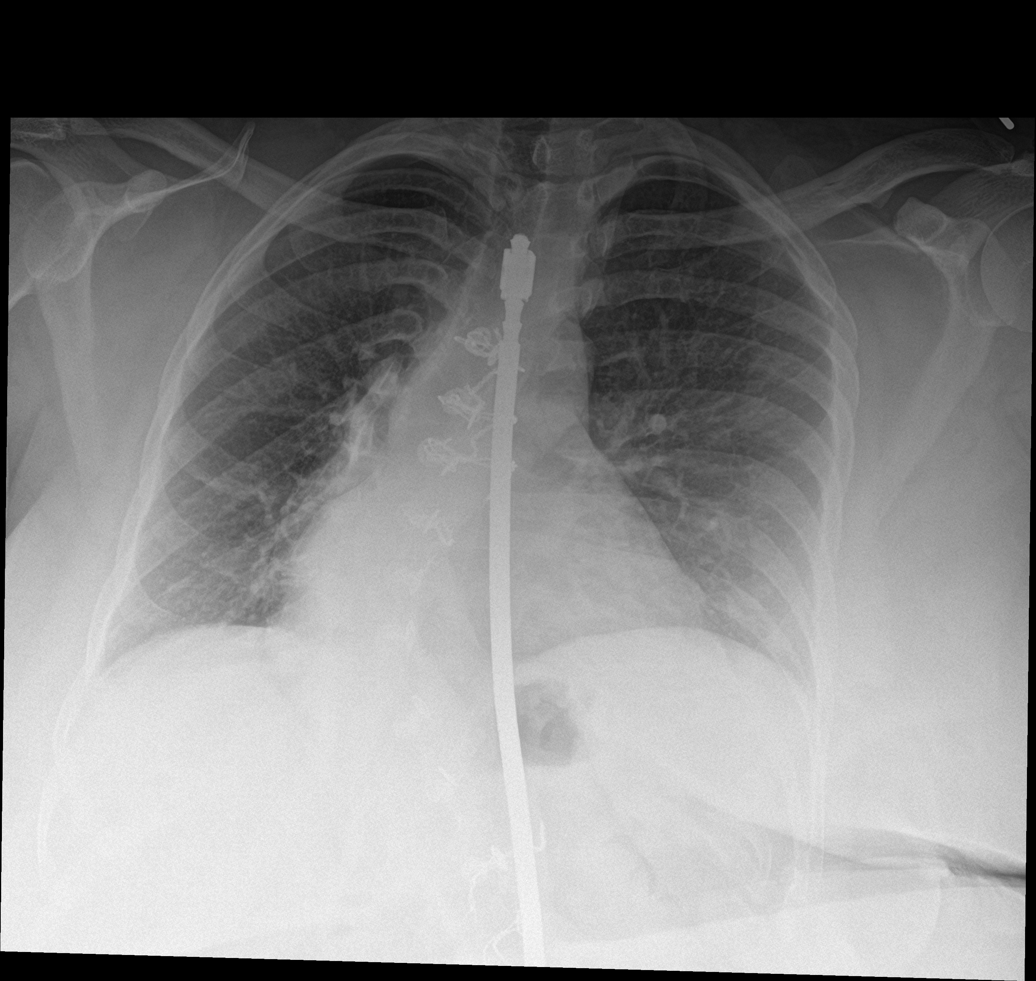

[chest lat]
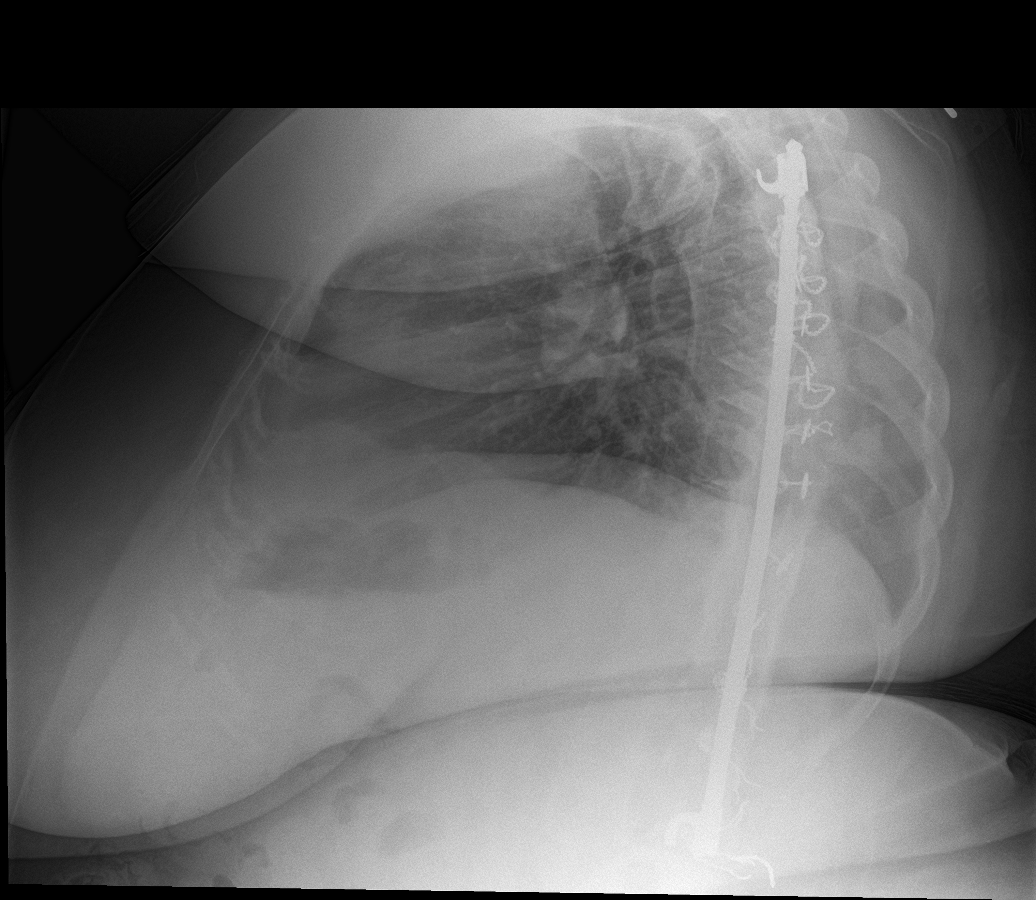

[4 of 4 positions shown; findings below may reference images not displayed]

FINDINGS: Mediastinum hilar structures normal. Cardiomegaly with normal
pulmonary vascularity. No pleural effusion or pneumothorax. Severe
thoracic spine scoliosis. Prior thoracic spine fusion.
IMPRESSION: 1. Prior CABG. Cardiomegaly. No evidence of overt congestive heart
failure. Chest is stable from [DATE].

2.  Thoracic spine scoliosis.  Prior thoracic spine fusion.

## 2015-11-14 MED ORDER — FUROSEMIDE 10 MG/ML IJ SOLN
20.0000 mg | Freq: Once | INTRAMUSCULAR | Status: AC
  Administered 2015-11-14: 20 mg via INTRAVENOUS
  Filled 2015-11-14: qty 4

## 2015-11-14 NOTE — ED Triage Notes (Signed)
Pt c/o Bl LE swelling with increased SOB over the past couple of weeks, states she was changed to lasix, had stress test last week and is suppose to see other specialist in the next 1-2 weeks.the patient is in NAD, VSS, respirations wnl on arrival..

## 2015-11-14 NOTE — Discharge Instructions (Signed)
You were evaluated for leg swelling, shortness of breath and chest pressure, and your exam and evaluation are reassuring in the emergency department stay.  You're given a dose of IV Lasix today. You may increase to 40 mg 3 times a day tomorrow. Please call your cardiologist office tomorrow to make an appointment within the next 2 days.  Return to the emergency room for any worsening condition including one-sided weakness or numbness, new or worsening chest pain or trouble breathing, fever, dizziness or passing out, or any other symptoms concerning to you.

## 2015-11-14 NOTE — ED Provider Notes (Addendum)
[facility] Emergency Department Provider Note ____________________________________________   I have reviewed the triage vital signs and the triage nursing note.  HISTORY  Chief Complaint Leg Swelling and Shortness of Breath   Historian Patient  HPI Tracey White is a 35 y.o. female with a history of morbid obesity, anxiety and peripheral edema, also reports recent hospitalization for shortness of breath found to be due to bronchitis, who is presenting today for shortness of breath and leg edema. She states that symptoms started a few weeks ago had just been getting worse. She has chest pressure when she walks as well as shortness of breath with exertion. She has had increasing lower extremity edema despite initially starting Lasix and hydrochlorothiazide 2 weeks ago, increasing to double that dose today.  No fever, she occasionally has right sputum production.    Past Medical History:  Diagnosis Date  . Anxiety   . Scoliosis     Patient Active Problem List   Diagnosis Date Noted  . Chest pain at rest 10/18/2015    Past Surgical History:  Procedure Laterality Date  . HERNIA REPAIR    . scoliosis repair    . TUBAL LIGATION      Prior to Admission medications   Medication Sig Start Date End Date Taking? Authorizing Provider  albuterol (PROVENTIL HFA;VENTOLIN HFA) 108 (90 Base) MCG/ACT inhaler Inhale 2 puffs into the lungs every 6 (six) hours as needed for wheezing or shortness of breath. 10/20/15  Yes Richard Leslye Peer, MD  ALPRAZolam Duanne Moron) 0.5 MG tablet Take 1 tablet by mouth 2 (two) times daily as needed. 11/14/15  Yes Historical Provider, MD  beclomethasone (QVAR) 40 MCG/ACT inhaler Inhale 1 puff into the lungs 2 (two) times daily. 10/20/15  Yes Richard Leslye Peer, MD  furosemide (LASIX) 20 MG tablet Take 1 tablet by mouth daily. 11/01/15  Yes Historical Provider, MD  metoprolol succinate (TOPROL-XL) 25 MG 24 hr tablet Take 1 tablet by mouth daily. 10/30/15   Yes Historical Provider, MD  escitalopram (LEXAPRO) 20 MG tablet Take 20 mg by mouth daily.     Historical Provider, MD  levofloxacin (LEVAQUIN) 750 MG tablet Take 1 tablet (750 mg total) by mouth daily. Patient not taking: Reported on 11/14/2015 10/21/15   Loletha Grayer, MD  predniSONE (DELTASONE) 5 MG tablet 6 tabs po day1; 5 tabs po day2; 4 tabs po day3; 3 tabs po day4; 2 tab po day 5; 1 tab po day6 Patient not taking: Reported on 11/14/2015 10/20/15   Loletha Grayer, MD    Allergies  Allergen Reactions  . Aspirin Cough         Family History  Problem Relation Age of Onset  . Diabetes Mother   . Hypertension Mother     Social History Social History  Substance Use Topics  . Smoking status: Current Every Day Smoker    Packs/day: 0.50    Types: Cigarettes  . Smokeless tobacco: Never Used  . Alcohol use Yes     Comment: occasionally    Review of Systems  Constitutional: Negative for fever. Eyes: Negative for visual changes. ENT: Negative for sore throat. Cardiovascular: Positive for chest sugar, no pleuritic chest pain. Respiratory: Positive for shortness of breath. Gastrointestinal: Negative for abdominal pain, vomiting and diarrhea. Genitourinary: Negative for dysuria. Musculoskeletal: Negative for back pain. Skin: Negative for rash. Neurological: Negative for headache. 10 point Review of Systems otherwise negative ____________________________________________   PHYSICAL EXAM:  VITAL SIGNS: ED Triage Vitals  Enc Vitals Group  BP 11/14/15 1551 124/78     Pulse Rate 11/14/15 1551 98     Resp 11/14/15 1551 18     Temp 11/14/15 1551 98.1 F (36.7 C)     Temp Source 11/14/15 1551 Oral     SpO2 11/14/15 1551 98 %     Weight 11/14/15 1552 (!) 332 lb (150.6 kg)     Height 11/14/15 1552 5' 1"  (1.549 m)     Head Circumference --      Peak Flow --      Pain Score 11/14/15 1552 0     Pain Loc --      Pain Edu? --      Excl. in [place]? --       Constitutional: Alert and oriented. Well appearing and in no distress. HEENT   Head: Normocephalic and atraumatic.      Eyes: Conjunctivae are normal. PERRL. Normal extraocular movements.      Ears:         Nose: No congestion/rhinnorhea.   Mouth/Throat: Mucous membranes are moist.   Neck: No stridor. Cardiovascular/Chest: Normal rate, regular rhythm.  No murmurs, rubs, or gallops. Respiratory: Normal respiratory effort without tachypnea nor retractions. Decreased breath sounds throughout all fields. No wheezing. No rhonchi or rales. Gastrointestinal: Soft. Obese. No distention, no guarding, no rebound. Nontender.    Genitourinary/rectal:Deferred Musculoskeletal: Nontender with normal range of motion in all extremities. No joint effusions.  No lower extremity tenderness. 4+ lower extremity pitting edema bilateral. Neurologic:  Normal speech and language. No gross or focal neurologic deficits are appreciated. Skin:  Skin is warm, dry and intact. No rash noted. Psychiatric: Mood and affect are normal. Speech and behavior are normal. Patient exhibits appropriate insight and judgment.   ____________________________________________  LABS (pertinent positives/negatives)  Labs Reviewed  BASIC METABOLIC PANEL - Abnormal; Notable for the following:       Result Value   Glucose, Bld 127 (*)    All other components within normal limits  CBC - Abnormal; Notable for the following:    Hemoglobin 10.9 (*)    HCT 34.3 (*)    MCV 75.1 (*)    MCH 23.9 (*)    MCHC 31.8 (*)    RDW 17.6 (*)    All other components within normal limits  TROPONIN I    ____________________________________________    EKG I, Lisa Roca, MD, the attending physician have personally viewed and interpreted all ECGs.  99 bpm. Normal sinus rhythm. Narrow QRS. Normal axis. Normal ST and T-wave ____________________________________________  RADIOLOGY All Xrays were viewed by me. Imaging interpreted  by Radiologist.  Chest x-ray two-view:  FINDINGS: Mediastinum hilar structures normal. Cardiomegaly with normal pulmonary vascularity. No pleural effusion or pneumothorax. Severe thoracic spine scoliosis. Prior thoracic spine fusion. __________________________________________  PROCEDURES  Procedure(s) performed: None  Critical Care performed: None  ____________________________________________   ED COURSE / ASSESSMENT AND PLAN  Pertinent labs & imaging results that were available during my care of the patient were reviewed by me and considered in my medical decision making (see chart for details).   Ms. Grays is here with shortness of breath which judging by her worsening peripheral edema may be related to pulmonary edema. Her O2 sat is okay at rest, but she reports significant dyspnea on exertion. No pneumonia or infiltrate on chest x-ray. She has failed outpatient therapy with increasing doses of Lasix at home, and will need IV Lasix for treatment here in the hospital.  Symptoms do not seem consistent  with pulmonary embolism.  No wheezing on exam in terms of bronchospasm. She did have an echo at previous hospitalization which showed good EF, she did not have a cardiac stress test.  EKG today looks normal with a negative troponin.    CONSULTATIONS:   Hospitalist for admission.   Patient / Family / Caregiver informed of clinical course, medical decision-making process, and agree with plan.  Addended to include, after discussion with the hospitalist Dr. Vianne Bulls, and review of the recent hospitalization, Dr. Vianne Bulls and it did not feel like the patient met criteria for hospital admission and recommended increasing her Lasix dose from 40 mg twice daily to 40 mg 3 times daily.   I reviewed with the patient again and she states that she actually saw her cardiologist Dr. Clayborn Bigness and had a stress test last week.  It felt like she actually has good follow-up in place, and given that  her chest x-ray shows no edema and her O2 sat is normal, I think it's reasonable to have her do outpatient follow-up. I did add on a repeat troponin and a BNP, which are reassuring. Patient is comfortable with this plan as well. We did discuss return precautions.  ___________________________________________   FINAL CLINICAL IMPRESSION(S) / ED DIAGNOSES   Final diagnoses:  Peripheral edema  Shortness of breath              Note: This dictation was prepared with Dragon dictation. Any transcriptional errors that result from this process are unintentional    Lisa Roca, MD 11/14/15 6629    Lisa Roca, MD 11/14/15 2027

## 2015-12-25 ENCOUNTER — Emergency Department: Payer: 59

## 2015-12-25 ENCOUNTER — Emergency Department
Admission: EM | Admit: 2015-12-25 | Discharge: 2015-12-25 | Disposition: A | Discharge status: Home / Self Care | Payer: 59 | Attending: Student in an Organized Health Care Education/Training Program | Admitting: Student in an Organized Health Care Education/Training Program

## 2015-12-25 DIAGNOSIS — R0602 Shortness of breath: Secondary | ICD-10-CM | POA: Diagnosis not present

## 2015-12-25 DIAGNOSIS — R600 Localized edema: Secondary | ICD-10-CM | POA: Insufficient documentation

## 2015-12-25 DIAGNOSIS — F17210 Nicotine dependence, cigarettes, uncomplicated: Secondary | ICD-10-CM | POA: Diagnosis not present

## 2015-12-25 DIAGNOSIS — I509 Heart failure, unspecified: Secondary | ICD-10-CM | POA: Insufficient documentation

## 2015-12-25 HISTORY — DX: Heart failure, unspecified: I50.9

## 2015-12-25 LAB — CBC
HEMATOCRIT: 33.7 % — AB (ref 35.0–47.0)
Hemoglobin: 10.8 g/dL — ABNORMAL LOW (ref 12.0–16.0)
MCH: 23.5 pg — ABNORMAL LOW (ref 26.0–34.0)
MCHC: 32 g/dL (ref 32.0–36.0)
MCV: 73.5 fL — ABNORMAL LOW (ref 80.0–100.0)
PLATELETS: 233 10*3/uL (ref 150–440)
RBC: 4.59 MIL/uL (ref 3.80–5.20)
RDW: 16.8 % — AB (ref 11.5–14.5)
WBC: 6.6 10*3/uL (ref 3.6–11.0)

## 2015-12-25 LAB — BASIC METABOLIC PANEL
Anion gap: 8 (ref 5–15)
BUN: 7 mg/dL (ref 6–20)
CALCIUM: 8.8 mg/dL — AB (ref 8.9–10.3)
CO2: 30 mmol/L (ref 22–32)
CREATININE: 0.79 mg/dL (ref 0.44–1.00)
Chloride: 99 mmol/L — ABNORMAL LOW (ref 101–111)
GFR calc Af Amer: >60 mL/min (ref >60)
GLUCOSE: 123 mg/dL — AB (ref 65–99)
POTASSIUM: 3.4 mmol/L — AB (ref 3.5–5.1)
SODIUM: 137 mmol/L (ref 135–145)

## 2015-12-25 LAB — TROPONIN I

## 2015-12-25 IMAGING — CR DG CHEST 2V
2 series · 2 of 2 positions shown · non-contrast
Comparison: Chest radiograph [DATE]

CLINICAL DATA: Low back pain, RIGHT leg swelling, shortness of
breath and intermittent chest pain for week. History of CHF.

EXAM:
CHEST  2 VIEW

[chest pa]
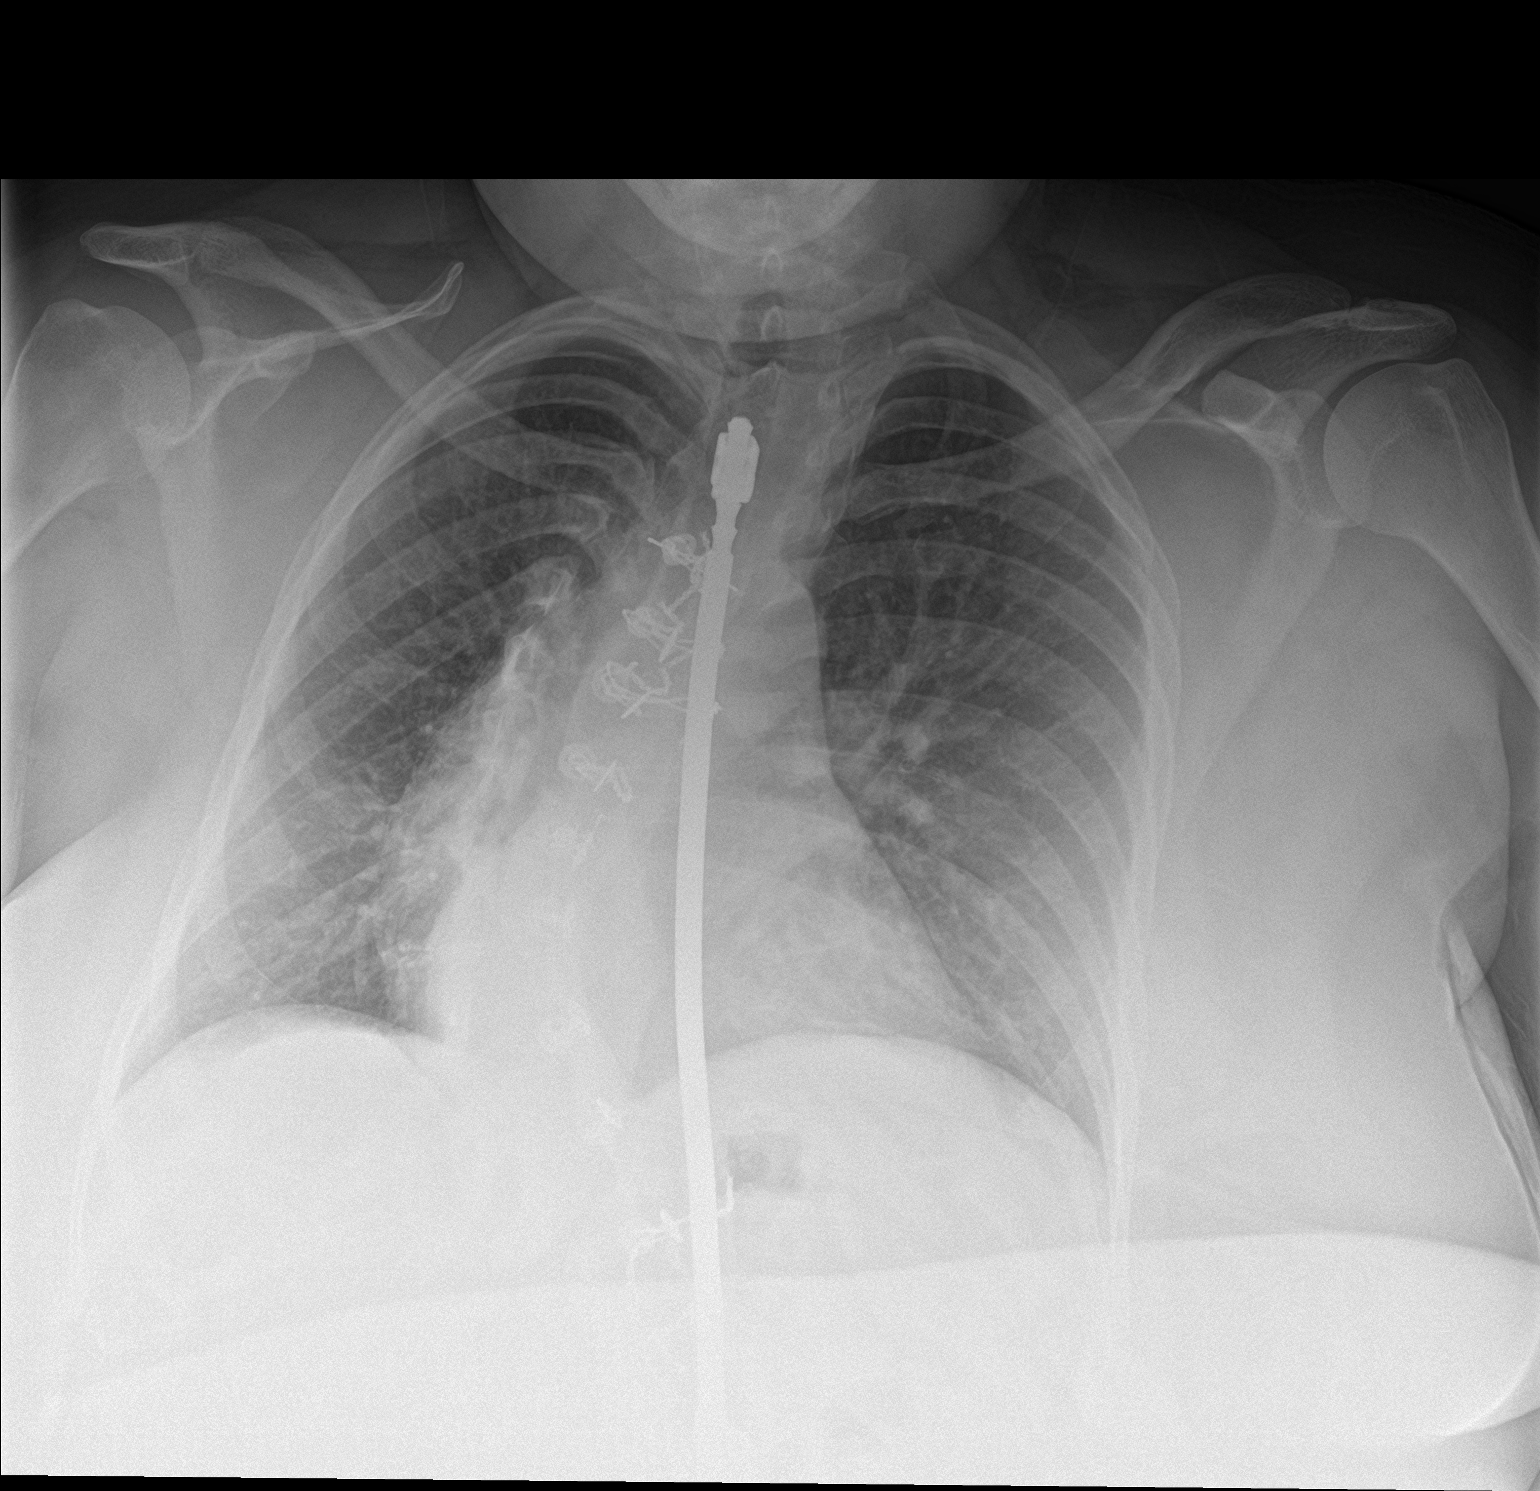

[chest lat]
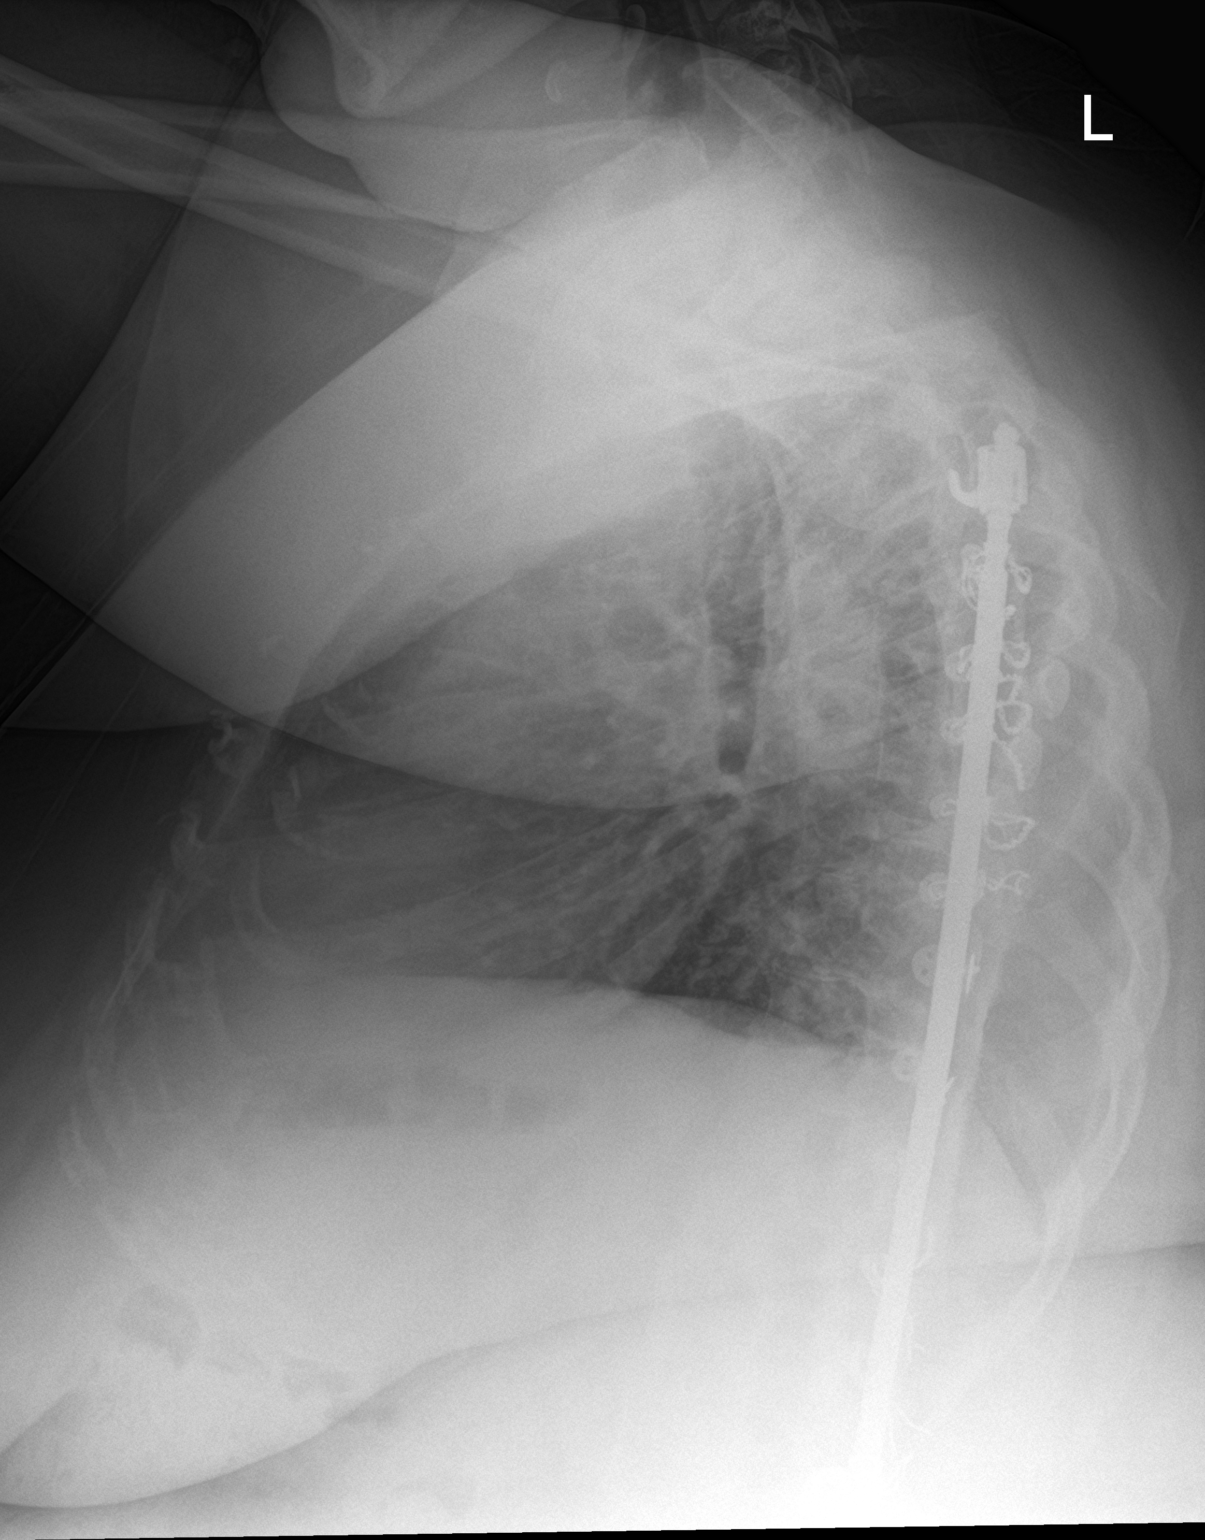

[2 of 2 positions shown; findings below may reference images not displayed]

FINDINGS: The cardiac silhouette is mildly enlarged, unchanged. Mediastinal
silhouette is nonsuspicious, status post median sternotomy. Mild
pulmonary vascular congestion. No pleural effusion or focal
consolidation. No pneumothorax. Thoracolumbar spinal hardware. Soft
tissue planes and included osseous structures are nonsuspicious,
large body habitus.
IMPRESSION: Mild cardiomegaly, mild pulmonary vascular congestion.

## 2015-12-25 MED ORDER — HYDROCODONE-ACETAMINOPHEN 5-325 MG PO TABS
1.0000 | ORAL_TABLET | Freq: Once | ORAL | Status: AC
  Administered 2015-12-25: 1 via ORAL
  Filled 2015-12-25: qty 1

## 2015-12-25 MED ORDER — T.E.D. BELOW KNEE/LARGE MISC: 2.0000 "application " | Freq: Every day | 0 refills | Status: DC

## 2015-12-25 MED ORDER — FUROSEMIDE 10 MG/ML IJ SOLN
60.0000 mg | Freq: Once | INTRAMUSCULAR | Status: AC
  Administered 2015-12-25: 60 mg via INTRAVENOUS
  Filled 2015-12-25: qty 8

## 2015-12-25 MED ORDER — KETOROLAC TROMETHAMINE 30 MG/ML IJ SOLN: 30.0000 mg | Freq: Once | INTRAMUSCULAR | Status: DC

## 2015-12-25 MED ORDER — KETAMINE HCL 10 MG/ML IJ SOLN: 25.0000 mg | Freq: Once | INTRAMUSCULAR | Status: DC

## 2015-12-25 NOTE — ED Notes (Signed)
Patient transported to Ultrasound 

## 2015-12-25 NOTE — ED Provider Notes (Signed)
[facility] Emergency Department Provider Note    First MD Initiated Contact with Patient 12/25/15 1952     (approximate)  I have reviewed the triage vital signs and the nursing notes.   HISTORY  Chief Complaint Shortness of Breath and Leg Swelling    HPI Tracey White is a 35 y.o. female  history of congestive heart failure as well as anxiety presents with worsening orthopnea as well as bilateral lower extremity swelling over the past several days. Patient states that she's been taking her Lasix 20 mg twice a day as directed but is not having adequate urine output here it feels that her legs are getting tighter. Denies any chest pains. States she also has a history of scoliosis and feels worsening back pain due to the swelling. Denies any recent fevers.   Past Medical History:  Diagnosis Date  . Anxiety   . CHF (congestive heart failure) (HCC)   . Scoliosis    Family History  Problem Relation Age of Onset  . Diabetes Mother   . Hypertension Mother    Past Surgical History:  Procedure Laterality Date  . HERNIA REPAIR    . scoliosis repair    . TUBAL LIGATION     Patient Active Problem List   Diagnosis Date Noted  . Chest pain at rest 10/18/2015      Prior to Admission medications   Medication Sig Start Date End Date Taking? Authorizing Provider  albuterol (PROVENTIL HFA;VENTOLIN HFA) 108 (90 Base) MCG/ACT inhaler Inhale 2 puffs into the lungs every 6 (six) hours as needed for wheezing or shortness of breath. 10/20/15   Alford Highlandichard Wieting, MD  ALPRAZolam Prudy Feeler(XANAX) 0.5 MG tablet Take 1 tablet by mouth 2 (two) times daily as needed. 11/14/15   Historical Provider, MD  beclomethasone (QVAR) 40 MCG/ACT inhaler Inhale 1 puff into the lungs 2 (two) times daily. 10/20/15   Alford Highlandichard Wieting, MD  escitalopram (LEXAPRO) 20 MG tablet Take 20 mg by mouth daily.     Historical Provider, MD  furosemide (LASIX) 20 MG tablet Take 1 tablet by mouth daily. 11/01/15    Historical Provider, MD  levofloxacin (LEVAQUIN) 750 MG tablet Take 1 tablet (750 mg total) by mouth daily. Patient not taking: Reported on 11/14/2015 10/21/15   Alford Highlandichard Wieting, MD  metoprolol succinate (TOPROL-XL) 25 MG 24 hr tablet Take 1 tablet by mouth daily. 10/30/15   Historical Provider, MD  predniSONE (DELTASONE) 5 MG tablet 6 tabs po day1; 5 tabs po day2; 4 tabs po day3; 3 tabs po day4; 2 tab po day 5; 1 tab po day6 Patient not taking: Reported on 11/14/2015 10/20/15   Alford Highlandichard Wieting, MD    Allergies Aspirin    Social History Social History  Substance Use Topics  . Smoking status: Current Every Day Smoker    Packs/day: 0.50    Types: Cigarettes  . Smokeless tobacco: Never Used  . Alcohol use Yes     Comment: occasionally    Review of Systems Patient denies headaches, rhinorrhea, blurry vision, numbness, shortness of breath, chest pain, edema, cough, abdominal pain, nausea, vomiting, diarrhea, dysuria, fevers, rashes or hallucinations unless otherwise stated above in HPI. ____________________________________________   PHYSICAL EXAM:  VITAL SIGNS: Vitals:   12/25/15 1807  BP: (!) 153/82  Pulse: 89  Resp: 18  Temp: 97.8 F (36.6 C)    Constitutional: Alert and oriented. Well appearing and in no acute distress. Eyes: Conjunctivae are normal. PERRL. EOMI. Head: Atraumatic. Nose: No  congestion/rhinnorhea. Mouth/Throat: Mucous membranes are moist.  Oropharynx non-erythematous. Neck: No stridor. Painless ROM. No cervical spine tenderness to palpation Hematological/Lymphatic/Immunilogical: No cervical lymphadenopathy. Cardiovascular: Normal rate, regular rhythm. Grossly normal heart sounds.  Good peripheral circulation. Respiratory: Normal respiratory effort.  No retractions. lung with diminished breath sounds in posterior bases Gastrointestinal: Soft and nontender. No distention. No abdominal bruits. No CVA tenderness. Musculoskeletal: No lower extremity tenderness  nor edema.  No joint effusions. Neurologic:  Normal speech and language. No gross focal neurologic deficits are appreciated. No gait instability. Skin:  Skin is warm, dry and intact. No rash noted. Psychiatric: Mood and affect are normal. Speech and behavior are normal. ____________________________________________   LABS (all labs ordered are listed, but only abnormal results are displayed)  Results for orders placed or performed during the hospital encounter of 12/25/15 (from the past 24 hour(s))  Basic metabolic panel     Status: Abnormal   Collection Time: 12/25/15  6:11 PM  Result Value Ref Range   Sodium 137 135 - 145 mmol/L   Potassium 3.4 (L) 3.5 - 5.1 mmol/L   Chloride 99 (L) 101 - 111 mmol/L   CO2 30 22 - 32 mmol/L   Glucose, Bld 123 (H) 65 - 99 mg/dL   BUN 7 6 - 20 mg/dL   Creatinine, Ser 1.61 0.44 - 1.00 mg/dL   Calcium 8.8 (L) 8.9 - 10.3 mg/dL   GFR calc non Af Amer >60 >60 mL/min   GFR calc Af Amer >60 >60 mL/min   Anion gap 8 5 - 15  CBC     Status: Abnormal   Collection Time: 12/25/15  6:11 PM  Result Value Ref Range   WBC 6.6 3.6 - 11.0 K/uL   RBC 4.59 3.80 - 5.20 MIL/uL   Hemoglobin 10.8 (L) 12.0 - 16.0 g/dL   HCT 09.6 (L) 04.5 - 40.9 %   MCV 73.5 (L) 80.0 - 100.0 fL   MCH 23.5 (L) 26.0 - 34.0 pg   MCHC 32.0 32.0 - 36.0 g/dL   RDW 81.1 (H) 91.4 - 78.2 %   Platelets 233 150 - 440 K/uL  Troponin I     Status: None   Collection Time: 12/25/15  6:11 PM  Result Value Ref Range   Troponin I <0.03 <0.03 ng/mL   ____________________________________________  EKG My review and personal interpretation at Time: 18:23   Indication: sob  Rate: 85  Rhythm: sinus Axis: normal Other: no acute ischemic changes, normal intervals ____________________________________________  RADIOLOGY  I personally reviewed all radiographic images ordered to evaluate for the above acute complaints and reviewed radiology reports and findings.  These findings were personally discussed  with the patient.  Please see medical record for radiology report. ____________________________________________   PROCEDURES  Procedure(s) performed: none Procedures    Critical Care performed: no ____________________________________________   INITIAL IMPRESSION / ASSESSMENT AND PLAN / ED COURSE  Pertinent labs & imaging results that were available during my care of the patient were reviewed by me and considered in my medical decision making (see chart for details).  DDX: Asthma, copd, CHF, pna, ptx, malignancy, Pe, anemia   Tracey White is a 35 y.o. who presents to the ED with chief complaint of increased shortness of breath and swelling over the past several days. Patient arrives afebrile hemodynamically stable. Does have marketed bilateral lower extremity swelling with the right lower extremity being mildly increased as compared to the left. Patient does not appear to be in any acute distress  but does have diminished breath sounds in posterior bases and her clinical exam is consistent with a mild component of volume overload. Chest x-ray was ordered to evaluate for any evidence of congestive heart failure. She does have cardiomegaly with mild of bony vascular prominence. No effusions. Blood work shows a no evidence of renal deficiency. Her EKG and troponin are negative for acute ischemia.  We will give a starting dose of IV Lasix to help remove some volume. Will order ultrasound lower extremity to evaluate for any component of DVT. This is not clinically consistent with pulmonary embolism. We'll continue to monitor patient  Clinical Course as of Dec 24 2340  Mon Dec 25, 2015  2200 Patient rechecked. Ultrasound no shows no evidence of DVT. Patient denies any chest pain or pressure in her EKG and troponin are negative. Do not feel this is consistent with acute ACS. Patient had recent cardiac evaluation which was unremarkable. Patient has had several subtle urination states that she is  feeling better. She is ambulating with no significant hypoxia. She is currently sitting in the bed setting 100% on room air with no increased use of accessory muscles or respiratory distress.  She is he medically stable with no hypoxia and no acute distress to feel patient is appropriate for follow-up as an outpatient. Patient is feeling better after Lasix and will continue to take that as directed. Discussed signs and symptoms for which she should return immediately to the hospital. Have also prescribed patient compression stockings which should help with her lower extremity edema. [PR]    Clinical Course User Index [PR] Willy EddyPatrick Iara Monds, MD     ____________________________________________   FINAL CLINICAL IMPRESSION(S) / ED DIAGNOSES  Final diagnoses:  Leg edema  Shortness of breath      NEW MEDICATIONS STARTED DURING THIS VISIT:  New Prescriptions   No medications on file     Note:  This document was prepared using Dragon voice recognition software and may include unintentional dictation errors.    Willy EddyPatrick Eternity Dexter, MD 12/25/15 [phone]

## 2015-12-25 NOTE — ED Notes (Signed)
Ambulated pt with pulse oximetry. Pt started at 100% then lowest dropped while walking was 92%. On way back to room pt was back up to 98%. When sitting back on bed pt dropped to 89% then after catching breath went back to 96%. Pt had to stop and rest during ambulating.

## 2015-12-25 NOTE — ED Triage Notes (Signed)
Pt c/o increased SOB with swelling in the RLE for the past 2-3 days,  Pt states she takes lasix twice daily..Marland Kitchen

## 2015-12-25 NOTE — ED Notes (Signed)

## 2015-12-25 NOTE — Discharge Instructions (Signed)
Return to ER for any increase in pain, if the pain changes or becomes worse with physical activity, you have shortness of breath, nausea or vomiting associated with the chest pain. ° °

## 2016-02-27 LAB — NM MYOCAR MULTI W/SPECT W/WALL MOTION / EF
CHL CUP NUCLEAR SRS: 2
CHL CUP NUCLEAR SSS: 0
CSEPEDS: 0 s
CSEPPHR: 121 {beats}/min
Estimated workload: 1 METS
Exercise duration (min): 1 min
LV dias vol: 94 mL (ref 46–106)
LV sys vol: 50 mL
Rest HR: 93 {beats}/min
SDS: 0
TID: 0.98

## 2016-03-20 ENCOUNTER — Ambulatory Visit: Payer: 59 | Attending: Internal Medicine

## 2016-03-20 DIAGNOSIS — G4733 Obstructive sleep apnea (adult) (pediatric): Secondary | ICD-10-CM | POA: Insufficient documentation

## 2016-05-11 ENCOUNTER — Emergency Department: Payer: 59

## 2016-05-11 ENCOUNTER — Emergency Department
Admission: EM | Admit: 2016-05-11 | Discharge: 2016-05-12 | Disposition: A | Discharge status: Home / Self Care | Payer: 59 | Attending: Emergency Medicine | Admitting: Emergency Medicine

## 2016-05-11 ENCOUNTER — Encounter: Payer: Self-pay | Admitting: Emergency Medicine

## 2016-05-11 DIAGNOSIS — F17210 Nicotine dependence, cigarettes, uncomplicated: Secondary | ICD-10-CM | POA: Diagnosis not present

## 2016-05-11 DIAGNOSIS — I509 Heart failure, unspecified: Secondary | ICD-10-CM | POA: Diagnosis not present

## 2016-05-11 DIAGNOSIS — R05 Cough: Secondary | ICD-10-CM | POA: Insufficient documentation

## 2016-05-11 DIAGNOSIS — R059 Cough, unspecified: Secondary | ICD-10-CM

## 2016-05-11 DIAGNOSIS — Z79899 Other long term (current) drug therapy: Secondary | ICD-10-CM | POA: Insufficient documentation

## 2016-05-11 DIAGNOSIS — R0981 Nasal congestion: Secondary | ICD-10-CM | POA: Diagnosis not present

## 2016-05-11 HISTORY — DX: Morbid (severe) obesity due to excess calories: E66.01

## 2016-05-11 HISTORY — DX: Localized edema: R60.0

## 2016-05-11 HISTORY — DX: Obstructive sleep apnea (adult) (pediatric): G47.33

## 2016-05-11 HISTORY — DX: Heart failure, unspecified: I50.9

## 2016-05-11 HISTORY — DX: Edema, unspecified: R60.9

## 2016-05-11 IMAGING — CR DG CHEST 2V
1 series · 2 of 2 positions shown · non-contrast
Comparison: [DATE] CXR

CLINICAL DATA: Dyspnea on exertion with increase in leg swelling.
Cough x1 week.

EXAM:
CHEST  2 VIEW

[Series 1: dg chest 2 view · 0.14mm/px · 2 of 2 slices shown]
[im 1/2]
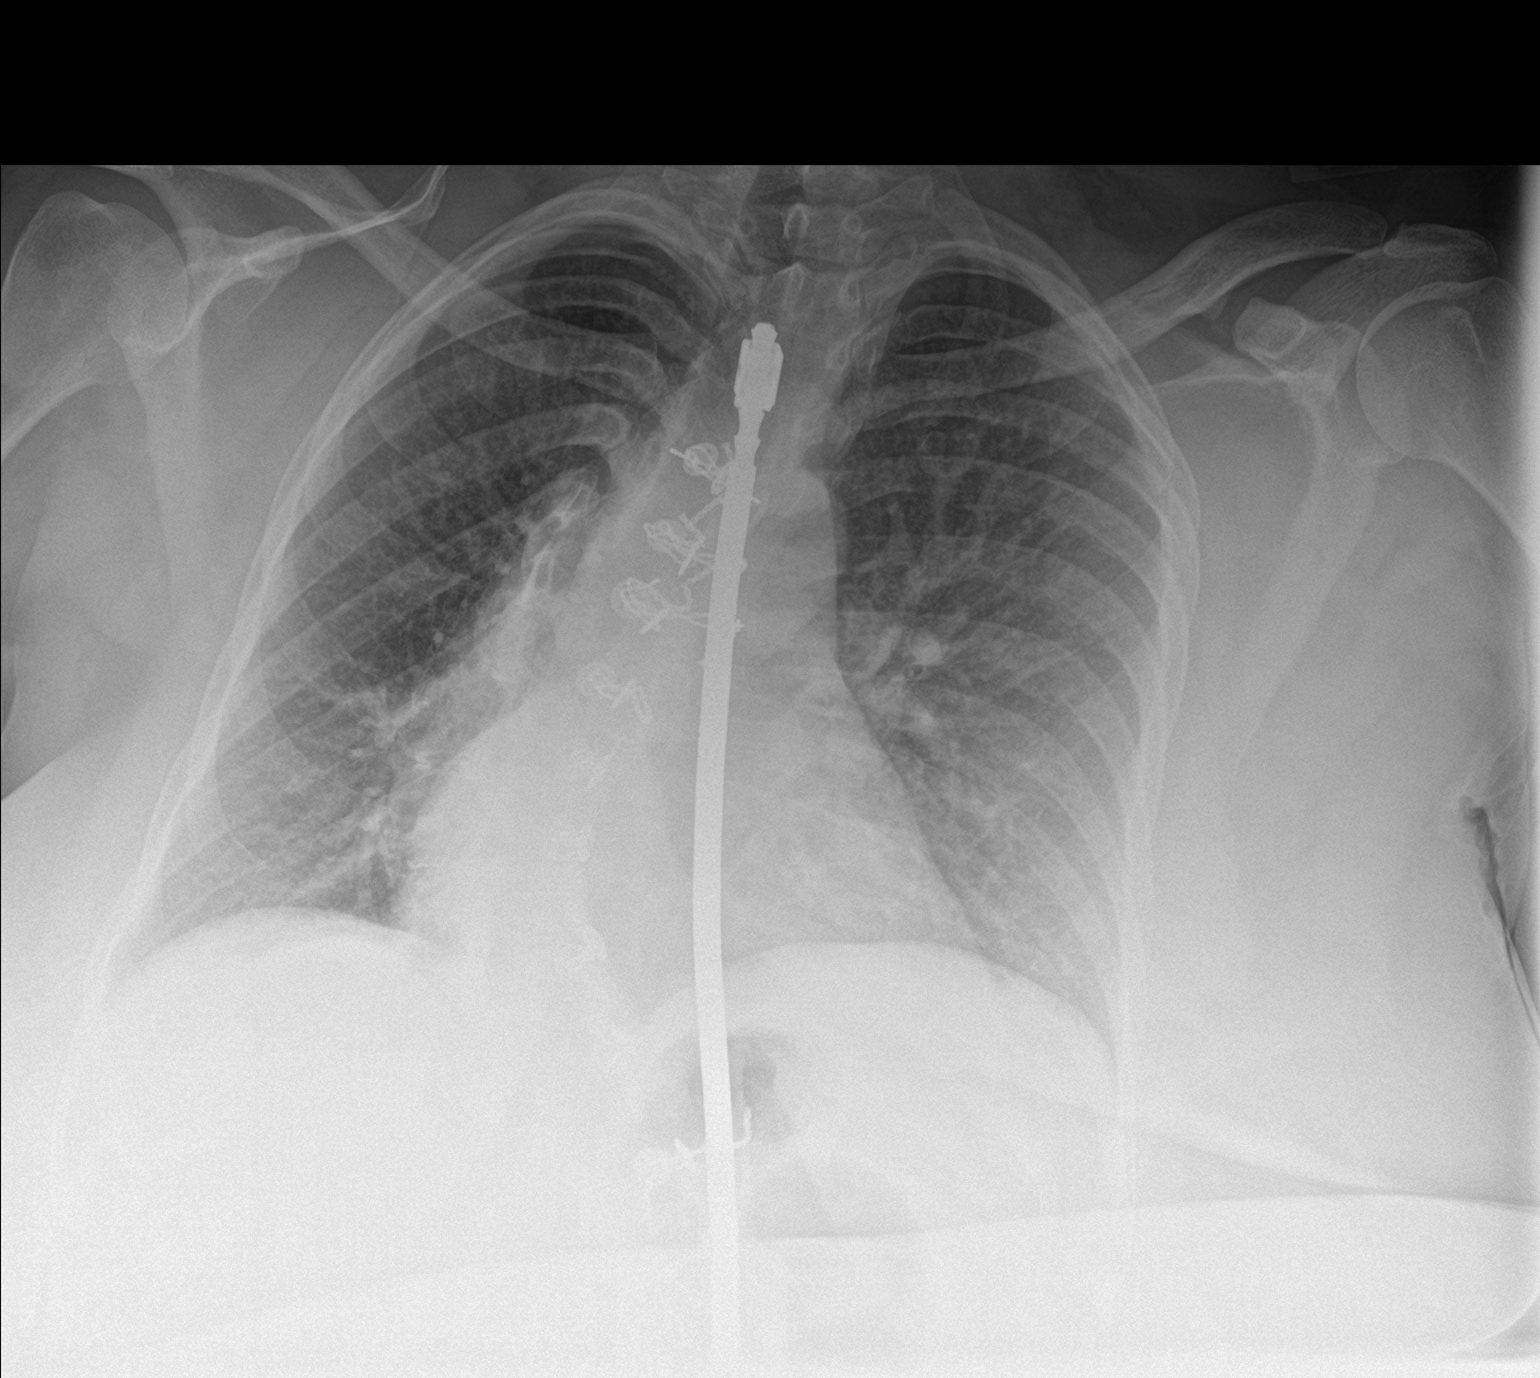
[im 2/2]
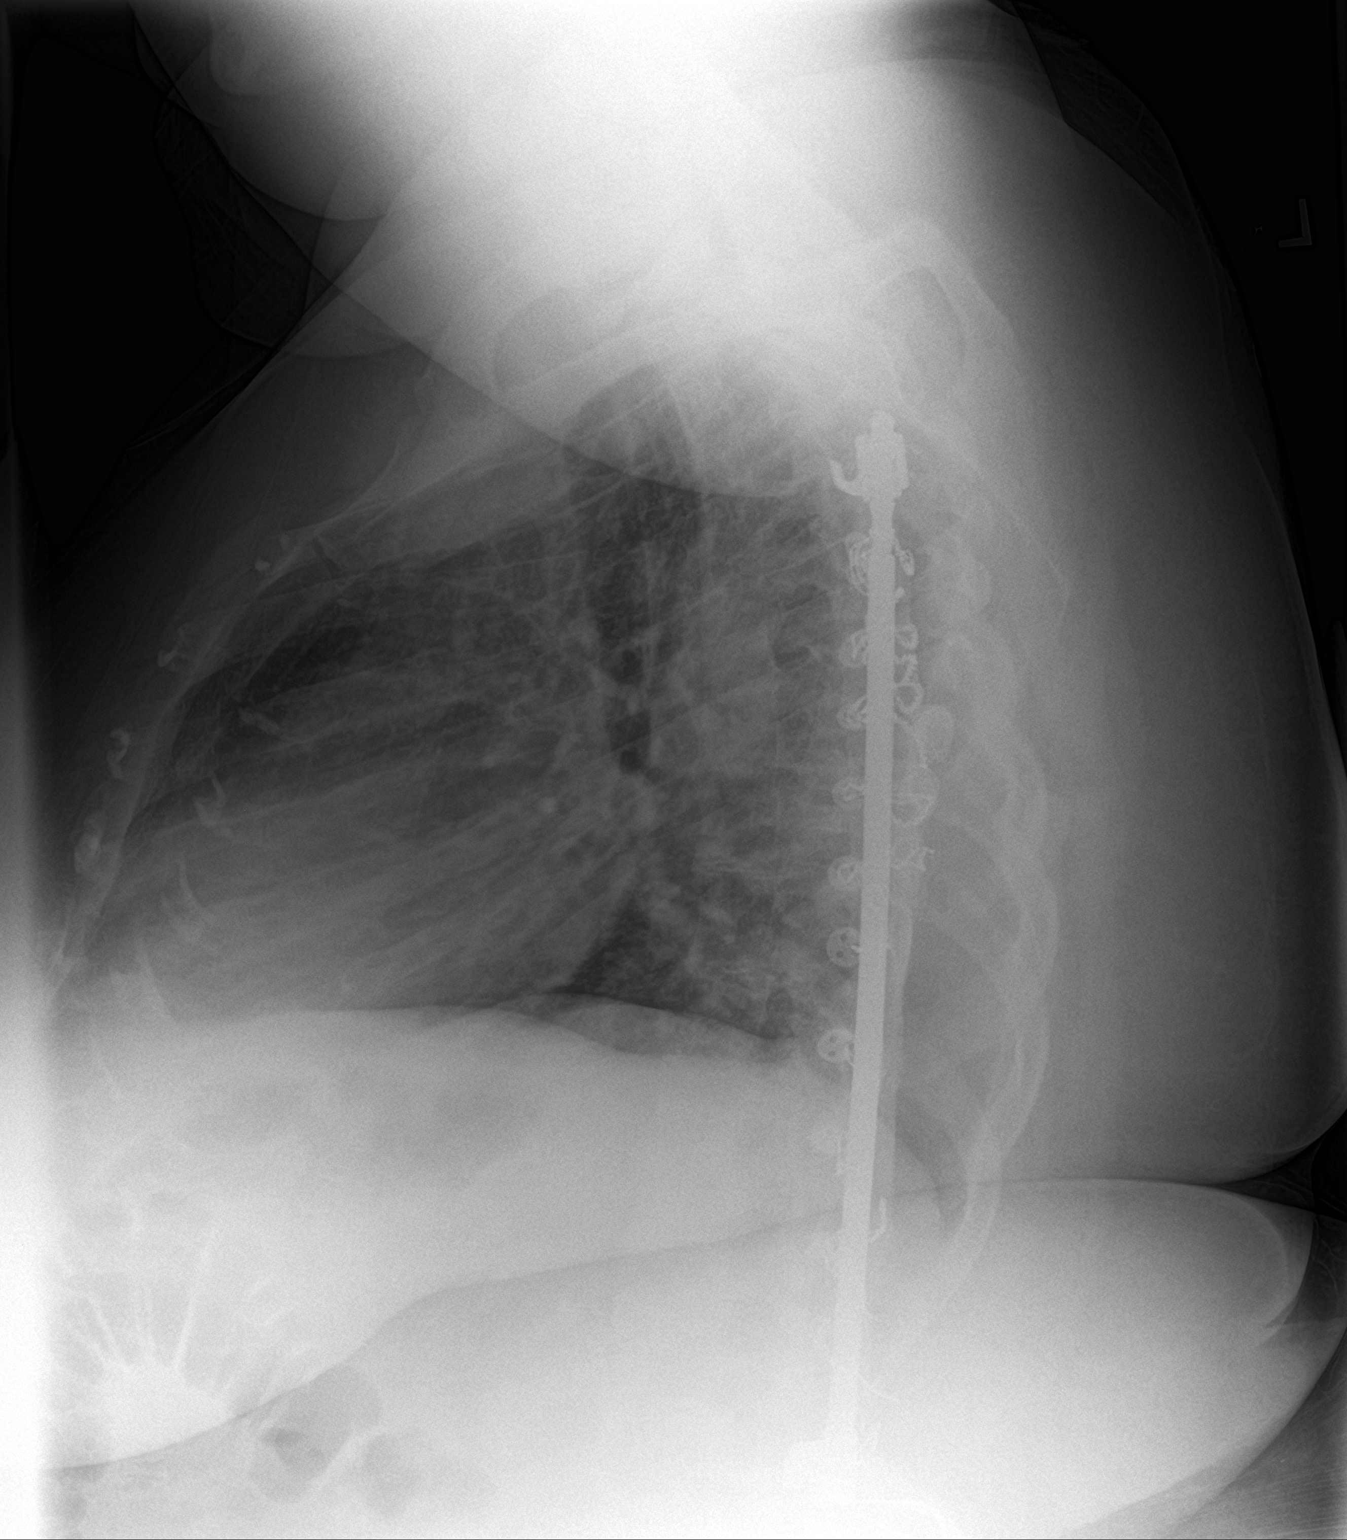

[2 of 2 positions shown; findings below may reference images not displayed]

FINDINGS: Stable borderline cardiomegaly. No aortic aneurysm. Dextroconvex
curvature of the lower thoracic spine with single Harrington rod
fixation is again noted. Mild vascular congestion is again seen
without pneumonic consolidation, effusion or pneumothorax.
IMPRESSION: Mild vascular congestion with borderline cardiomegaly. Findings are
similar to that of [DATE]. No pneumonic consolidation or
pneumothorax.

## 2016-05-11 MED ORDER — ALBUTEROL SULFATE (2.5 MG/3ML) 0.083% IN NEBU
5.0000 mg | INHALATION_SOLUTION | Freq: Once | RESPIRATORY_TRACT | Status: AC
  Administered 2016-05-11: 5 mg via RESPIRATORY_TRACT
  Filled 2016-05-11: qty 6

## 2016-05-11 NOTE — ED Provider Notes (Signed)
[facility] Emergency Department Provider Note  ____________________________________________   First MD Initiated Contact with Patient 05/11/16 2339     (approximate)  I have reviewed the triage vital signs and the nursing notes.   HISTORY  Chief Complaint Cough (Pt. states cough for about 1 week.) and Leg Swelling (Pt. states on diuretics for about 1 year increase in leg swelling in the past two days.)    HPI Tracey White is a 36 y.o. female with a medical history as listed below who presents for evaluation of persistent cough 1 week.  She reports that it started as a cold with nasal congestion, runny nose, sinus drainage, and cough.  The rest of the symptoms  Have resolved but the cough has persisted.  It is nonproductive, worse at night, worse with lying flat, and nothing in particular makes it better or worse.  She states that her body aches all over because of the persistent cough but she denies fever/chills, chest pain, abdominal pain, nausea, vomiting, dysuria.  She becomes mildly short of breath with exertion as well as when she is having coughing episodes but at rest she is not short of breath.  She is on Lasix and was recently started on multiple inhalers from her primary care doctor (recently changed from Dr. Thedore Mins to Dr. Madelin Rear) to try and help with possible asthma versus COPD as she is a sometime smoker.  She is not sure if these medications are helping.  Past Medical History:  Diagnosis Date  . Anxiety   . Chronic heart failure (HCC)   . Morbid obesity (HCC)   . OSA (obstructive sleep apnea)    uses CPAP at home  . Peripheral edema   . Scoliosis     Patient Active Problem List   Diagnosis Date Noted  . Chest pain at rest 10/18/2015    Past Surgical History:  Procedure Laterality Date  . HERNIA REPAIR    . scoliosis repair    . TUBAL LIGATION      Prior to Admission medications   Medication Sig Start Date End Date Taking?  Authorizing Provider  albuterol (PROVENTIL HFA;VENTOLIN HFA) 108 (90 Base) MCG/ACT inhaler Inhale 2-4 puffs by mouth every 4 hours as needed for wheezing, cough, and/or shortness of breath 05/12/16   Loleta Loeta, MD  ALPRAZolam Prudy Feeler) 0.5 MG tablet Take 1 tablet by mouth 2 (two) times daily as needed. 11/14/15   Historical Provider, MD  beclomethasone (QVAR) 40 MCG/ACT inhaler Inhale 1 puff into the lungs 2 (two) times daily. 10/20/15   Alford Highland, MD  Elastic Bandages & Supports (T.E.D. BELOW KNEE/LARGE) MISC 2 application by Does not apply route daily. 12/25/15   Willy Eddy, MD  escitalopram (LEXAPRO) 20 MG tablet Take 20 mg by mouth daily.     Historical Provider, MD  furosemide (LASIX) 20 MG tablet Take 1 tablet by mouth daily. 11/01/15   Historical Provider, MD  HYDROcodone-homatropine (HYCODAN) 5-1.5 MG/5ML syrup Take 5 mLs by mouth every 6 (six) hours as needed for cough. 05/12/16   Loleta Daysha, MD  levofloxacin (LEVAQUIN) 750 MG tablet Take 1 tablet (750 mg total) by mouth daily. Patient not taking: Reported on 11/14/2015 10/21/15   Alford Highland, MD  metoprolol succinate (TOPROL-XL) 25 MG 24 hr tablet Take 1 tablet by mouth daily. 10/30/15   Historical Provider, MD  predniSONE (DELTASONE) 5 MG tablet 6 tabs po day1; 5 tabs po day2; 4 tabs po day3; 3 tabs po day4; 2  tab po day 5; 1 tab po day6 Patient not taking: Reported on 11/14/2015 10/20/15   Alford Highland, MD    Allergies Aspirin  Family History  Problem Relation Age of Onset  . Diabetes Mother   . Hypertension Mother     Social History Social History  Substance Use Topics  . Smoking status: Current Some Day Smoker    Packs/day: 0.50    Types: Cigarettes    Last attempt to quit: 03/27/2016  . Smokeless tobacco: Never Used  . Alcohol use Yes     Comment: occasionally    Review of Systems Constitutional: No fever/chills Eyes: No visual changes. ENT: No sore throat.  Nasal congestion and sinus  drainage. Cardiovascular: Denies chest pain. Respiratory: Persistent severe cough that is nonproductive.  Mild shortness of breath with exertion and with coughing episodes Gastrointestinal: Morbid obesity.  No abdominal pain.  No nausea, no vomiting.  No diarrhea.  No constipation. Genitourinary: Negative for dysuria. Musculoskeletal: Negative for back pain. Skin: Negative for rash. Neurological: Negative for headaches, focal weakness or numbness.  10-point ROS otherwise negative.  ____________________________________________   PHYSICAL EXAM:  VITAL SIGNS: ED Triage Vitals  Enc Vitals Group     BP 05/11/16 2035 (!) 132/57     Pulse Rate 05/11/16 2035 97     Resp --      Temp 05/11/16 2035 98.1 F (36.7 C)     Temp src --      SpO2 05/11/16 2035 97 %     Weight 05/11/16 2036 (!) 363 lb (164.7 kg)     Height 05/11/16 2036  (1.549 m)     Head Circumference --      Peak Flow --      Pain Score 05/11/16 2032 9     Pain Loc --      Pain Edu? --      Excl. in GC? --     Constitutional: Alert and oriented. Well appearing and in no acute distress Except for frequent cough Eyes: Conjunctivae are normal. PERRL. EOMI. Head: Atraumatic. Nose: +congestion, "stuffy nose" Mouth/Throat: Mucous membranes are moist. Neck: No stridor.  No meningeal signs.   Cardiovascular: Normal rate, regular rhythm. Good peripheral circulation. Grossly normal heart sounds. Respiratory: Normal respiratory effort.  No retractions. Lungs CTAB.  Frequent forceful non-productive cough Gastrointestinal: Soft and nontender. No distention.  Musculoskeletal: 1+ bilateral peripheral edema with no evidence of infection. No gross deformities of extremities. Neurologic:  Normal speech and language. No gross focal neurologic deficits are appreciated.  Skin:  Skin is warm, dry and intact. No rash noted. Psychiatric: Mood and affect are normal. Speech and behavior are  normal.  ____________________________________________   LABS (all labs ordered are listed, but only abnormal results are displayed)  Labs Reviewed - No data to display ____________________________________________  EKG  ED ECG REPORT I, Taja Pentland, the attending physician, personally viewed and interpreted this ECG.  Date: 05/10/2016 EKG Time: 20:43 Rate: 96 Rhythm: normal sinus rhythm QRS Axis: normal Intervals: normal ST/T Wave abnormalities: normal Conduction Disturbances: none Narrative Interpretation: unremarkable  ____________________________________________  RADIOLOGY   Dg Chest 2 View  Result Date: 05/11/2016 CLINICAL DATA:  Dyspnea on exertion with increase in leg swelling. Cough x1 week. EXAM: CHEST  2 VIEW COMPARISON:  12/25/2015 CXR FINDINGS: Stable borderline cardiomegaly. No aortic aneurysm. 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 Mild vascular congestion is again seen without pneumonic consolidation, effusion or pneumothorax.  IMPRESSION: Mild vascular congestion with borderline cardiomegaly. Findings are similar to that of 12/25/2015. No pneumonic consolidation or pneumothorax. Electronically Signed   By: Tollie Eth M.D.   On: 05/11/2016 21:31    ____________________________________________   PROCEDURES  Critical Care performed: No   Procedure(s) performed:   Procedures   ____________________________________________   INITIAL IMPRESSION / ASSESSMENT AND PLAN / ED COURSE  Pertinent labs & imaging results that were available during my care of the patient were reviewed by me and considered in my medical decision making (see chart for details).  The patient is PERC negative and not having any chest pain.  She still has nasal congestion and I do believe that this episode started as a viral bronchitis and has persisted.  However she does have issues with mild heart failure and is  currently on Lasix and has 1+ pitting edema bilaterally.  I explained to her during a lengthy conversation that I believe her symptoms are multifactorial: Obesity/restrictive airway disease, a small degree of obstructive airway disease, viral bronchitis, and chronic heart failure.  She has some stable vascular congestion on her chest x-ray.  I am giving her an extra dose of Lasix and prescriptions for albuterol to help with the bronchospasm as well as some cough medicine.  I explained that I think that she would be most helped by going to the heart failure clinic for medication optimization.  I reviewed her medication list and she is not on an ACE inhibitor as a possible source of her cough.  I explained to her that I think she will need some more time to recover from the viral illness but that she should follow up with the heart failure clinic and they will likely do some additional testing and may be able to help her optimize her medications since she does have clear fluid retention, orthopnea, etc.  She already uses a CPAP at night which she states does help.  I do not feel that lab work would be useful at this time as it will not contribute to our current plan of care.  At this point there is no sign of an acute or emergent medical condition such as but not limited to pulmonary embolism, ACS, pneumonia.    I gave my usual and customary return precautions.  She understands and agrees with the plan.      ____________________________________________  FINAL CLINICAL IMPRESSION(S) / ED DIAGNOSES  Final diagnoses:  Acute on chronic heart failure, unspecified heart failure type (HCC)  Cough     MEDICATIONS GIVEN DURING THIS VISIT:  Medications  albuterol (PROVENTIL) (2.5 MG/3ML) 0.083% nebulizer solution 5 mg (5 mg Nebulization Given 05/11/16 2049)  furosemide (LASIX) tablet 40 mg (40 mg Oral Given 05/12/16 0011)  ibuprofen (ADVIL,MOTRIN) tablet 600 mg (600 mg Oral Given 05/12/16 0011)     NEW  OUTPATIENT MEDICATIONS STARTED DURING THIS VISIT:  New Prescriptions   ALBUTEROL (PROVENTIL HFA;VENTOLIN HFA) 108 (90 BASE) MCG/ACT INHALER    Inhale 2-4 puffs by mouth every 4 hours as needed for wheezing, cough, and/or shortness of breath   HYDROCODONE-HOMATROPINE (HYCODAN) 5-1.5 MG/5ML SYRUP    Take 5 mLs by mouth every 6 (six) hours as needed for cough.    Modified Medications   No medications on file    Discontinued Medications   ALBUTEROL (PROVENTIL HFA;VENTOLIN HFA) 108 (90 BASE) MCG/ACT INHALER    Inhale 2 puffs into the lungs every 6 (six) hours as needed for wheezing or shortness of breath.  Note:  This document was prepared using Dragon voice recognition software and may include unintentional dictation errors.    Loleta Maelynn, MD 05/12/16 [phone]

## 2016-05-11 NOTE — ED Notes (Signed)
Pt ambulatory with steady gait to stat registration to inquire about time; discussed same with pt; given cup of ice as requested; cough noted; pt wearing mask

## 2016-05-11 NOTE — ED Triage Notes (Signed)
Pt. States increase in leg swelling with shortness of breath upon exesion.  Pt. Also states cough for the past week.

## 2016-05-12 ENCOUNTER — Encounter: Payer: Self-pay | Admitting: Emergency Medicine

## 2016-05-12 MED ORDER — HYDROCODONE-ACETAMINOPHEN 5-325 MG PO TABS
2.0000 | ORAL_TABLET | Freq: Once | ORAL | Status: AC
  Administered 2016-05-12: 2 via ORAL

## 2016-05-12 MED ORDER — IBUPROFEN 600 MG PO TABS
600.0000 mg | ORAL_TABLET | Freq: Once | ORAL | Status: AC
  Administered 2016-05-12: 600 mg via ORAL
  Filled 2016-05-12: qty 1

## 2016-05-12 MED ORDER — HYDROCODONE-ACETAMINOPHEN 5-325 MG PO TABS
ORAL_TABLET | ORAL | Status: AC
  Administered 2016-05-12: 2 via ORAL
  Filled 2016-05-12: qty 2

## 2016-05-12 MED ORDER — ALBUTEROL SULFATE HFA 108 (90 BASE) MCG/ACT IN AERS: INHALATION_SPRAY | RESPIRATORY_TRACT | 1 refills | Status: DC

## 2016-05-12 MED ORDER — FUROSEMIDE 40 MG PO TABS
40.0000 mg | ORAL_TABLET | Freq: Once | ORAL | Status: AC
  Administered 2016-05-12: 40 mg via ORAL
  Filled 2016-05-12: qty 1

## 2016-05-12 MED ORDER — HYDROCODONE-HOMATROPINE 5-1.5 MG/5ML PO SYRP: 5.0000 mL | ORAL_SOLUTION | Freq: Four times a day (QID) | ORAL | 0 refills | Status: DC | PRN

## 2016-05-12 NOTE — Discharge Instructions (Signed)
As we discussed, there is no evidence that you have pneumonia, and we believe that multiple factors are causing your persistent cough.  Given that you have what appears to be a little bit of fluid on your lungs and you have the swelling in your lower extremities in spite of taking Lasix, we believe you may benefit from seeing one of the heart failure specialist.  We have given you their are name and number and they will likely recheck to you on Monday to schedule an appointment.  We gave you an extra dose of Lasix tonight as well as a prescription for albuterol to see if that helps with your bronchospasm.  We encourage you to follow up with your regular doctor as well.  Return to the emergency department if you develop new or worsening symptoms that concern you.

## 2016-05-14 ENCOUNTER — Telehealth: Payer: Self-pay

## 2016-05-14 NOTE — Telephone Encounter (Signed)
Pt scheduled for initial appointment on 05/15/2016. Thank you for the referral.

## 2016-05-15 ENCOUNTER — Telehealth: Payer: Self-pay | Admitting: Family

## 2016-05-15 ENCOUNTER — Encounter: Payer: Self-pay | Admitting: Family

## 2016-05-15 ENCOUNTER — Ambulatory Visit: Payer: 59 | Attending: Family | Admitting: Family

## 2016-05-15 ENCOUNTER — Ambulatory Visit: Payer: Self-pay | Admitting: Family

## 2016-05-15 DIAGNOSIS — Z87891 Personal history of nicotine dependence: Secondary | ICD-10-CM | POA: Diagnosis not present

## 2016-05-15 DIAGNOSIS — Z79899 Other long term (current) drug therapy: Secondary | ICD-10-CM | POA: Diagnosis not present

## 2016-05-15 DIAGNOSIS — Z9989 Dependence on other enabling machines and devices: Secondary | ICD-10-CM

## 2016-05-15 DIAGNOSIS — I5032 Chronic diastolic (congestive) heart failure: Secondary | ICD-10-CM | POA: Diagnosis not present

## 2016-05-15 DIAGNOSIS — F419 Anxiety disorder, unspecified: Secondary | ICD-10-CM | POA: Insufficient documentation

## 2016-05-15 DIAGNOSIS — R000 Tachycardia, unspecified: Secondary | ICD-10-CM | POA: Diagnosis not present

## 2016-05-15 DIAGNOSIS — G4733 Obstructive sleep apnea (adult) (pediatric): Secondary | ICD-10-CM | POA: Insufficient documentation

## 2016-05-15 DIAGNOSIS — Z720 Tobacco use: Secondary | ICD-10-CM

## 2016-05-15 LAB — BASIC METABOLIC PANEL
Anion gap: 12 (ref 5–15)
BUN: 13 mg/dL (ref 6–20)
CO2: 28 mmol/L (ref 22–32)
CREATININE: 0.78 mg/dL (ref 0.44–1.00)
Calcium: 9 mg/dL (ref 8.9–10.3)
Chloride: 99 mmol/L — ABNORMAL LOW (ref 101–111)
GFR calc Af Amer: >60 mL/min (ref >60)
Glucose, Bld: 104 mg/dL — ABNORMAL HIGH (ref 65–99)
Potassium: 3.4 mmol/L — ABNORMAL LOW (ref 3.5–5.1)
SODIUM: 139 mmol/L (ref 135–145)

## 2016-05-15 MED ORDER — TORSEMIDE 20 MG PO TABS: 40.0000 mg | ORAL_TABLET | Freq: Two times a day (BID) | ORAL | 5 refills | Status: DC

## 2016-05-15 MED ORDER — POTASSIUM CHLORIDE CRYS ER 20 MEQ PO TBCR: 20.0000 meq | EXTENDED_RELEASE_TABLET | Freq: Two times a day (BID) | ORAL | 5 refills | Status: DC

## 2016-05-15 NOTE — Patient Instructions (Addendum)
Begin weighing daily and call for an overnight weight gain of > 2 pounds or a weekly weight gain of >5 pounds.  Maintain fluid intake between 40-50 ounces daily  Stop furosemide  Increase torsemide to  (2 tablets) twice daily   Low-Sodium Eating Plan Sodium, which is an element that makes up salt, helps you maintain a healthy balance of fluids in your body. Too much sodium can increase your blood pressure and cause fluid and waste to be held in your body. Your health care provider or dietitian may recommend following this plan if you have high blood pressure (hypertension), kidney disease, liver disease, or heart failure. Eating less sodium can help lower your blood pressure, reduce swelling, and protect your heart, liver, and kidneys. What are tips for following this plan? General guidelines   Most people on this plan should limit their sodium intake to 1,500-2,000 mg (milligrams) of sodium each day. Reading food labels   The Nutrition Facts label lists the amount of sodium in one serving of the food. If you eat more than one serving, you must multiply the listed amount of sodium by the number of servings.  Choose foods with less than 140 mg of sodium per serving.  Avoid foods with 300 mg of sodium or more per serving. Shopping   Look for lower-sodium products, often labeled as "low-sodium" or "no salt added."  Always check the sodium content even if foods are labeled as "unsalted" or "no salt added".  Buy fresh foods.  Avoid canned foods and premade or frozen meals.  Avoid canned, cured, or processed meats  Buy breads that have less than 80 mg of sodium per slice. Cooking   Eat more home-cooked food and less restaurant, buffet, and fast food.  Avoid adding salt when cooking. Use salt-free seasonings or herbs instead of table salt or sea salt. Check with your health care provider or pharmacist before using salt substitutes.  Cook with plant-based oils, such as canola,  sunflower, or olive oil. Meal planning   When eating at a restaurant, ask that your food be prepared with less salt or no salt, if possible.  Avoid foods that contain MSG (monosodium glutamate). MSG is sometimes added to [place] food, bouillon, and some canned foods. What foods are recommended? The items listed may not be a complete list. Talk with your dietitian about what dietary choices are best for you. Grains  Low-sodium cereals, including oats, puffed wheat and rice, and shredded wheat. Low-sodium crackers. Unsalted rice. Unsalted pasta. Low-sodium bread. Whole-grain breads and whole-grain pasta. Vegetables  Fresh or frozen vegetables. "No salt added" canned vegetables. "No salt added" tomato sauce and paste. Low-sodium or reduced-sodium tomato and vegetable juice. Fruits  Fresh, frozen, or canned fruit. Fruit juice. Meats and other protein foods  Fresh or frozen (no salt added) meat, poultry, seafood, and fish. Low-sodium canned tuna and salmon. Unsalted nuts. Dried peas, beans, and lentils without added salt. Unsalted canned beans. Eggs. Unsalted nut butters. Dairy  Milk. Soy milk. Cheese that is naturally low in sodium, such as ricotta cheese, fresh mozzarella, or Swiss cheese Low-sodium or reduced-sodium cheese. Cream cheese. Yogurt. Fats and oils  Unsalted butter. Unsalted margarine with no trans fat. Vegetable oils such as canola or olive oils. Seasonings and other foods  Fresh and dried herbs and spices. Salt-free seasonings. Low-sodium mustard and ketchup. Sodium-free salad dressing. Sodium-free light mayonnaise. Fresh or refrigerated horseradish. Lemon juice. Vinegar. Homemade, reduced-sodium, or low-sodium soups. Unsalted popcorn and pretzels. Low-salt or  salt-free chips. What foods are not recommended? The items listed may not be a complete list. Talk with your dietitian about what dietary choices are best for you. Grains  Instant hot cereals. Bread stuffing, pancake, and  biscuit mixes. Croutons. Seasoned rice or pasta mixes. Noodle soup cups. Boxed or frozen macaroni and cheese. Regular salted crackers. Self-rising flour. Vegetables  Sauerkraut, pickled vegetables, and relishes. Olives. [place] fries. Onion rings. Regular canned vegetables (not low-sodium or reduced-sodium). Regular canned tomato sauce and paste (not low-sodium or reduced-sodium). Regular tomato and vegetable juice (not low-sodium or reduced-sodium). Frozen vegetables in sauces. Meats and other protein foods  Meat or fish that is salted, canned, smoked, spiced, or pickled. Bacon, ham, sausage, hotdogs, corned beef, chipped beef, packaged lunch meats, salt pork, jerky, pickled herring, anchovies, regular canned tuna, sardines, salted nuts. Dairy  Processed cheese and cheese spreads. Cheese curds. Blue cheese. Feta cheese. String cheese. Regular cottage cheese. Buttermilk. Canned milk. Fats and oils  Salted butter. Regular margarine. Ghee. Bacon fat. Seasonings and other foods  Onion salt, garlic salt, seasoned salt, table salt, and sea salt. Canned and packaged gravies. Worcestershire sauce. Tartar sauce. Barbecue sauce. Teriyaki sauce. Soy sauce, including reduced-sodium. Steak sauce. Fish sauce. Oyster sauce. Cocktail sauce. Horseradish that you find on the shelf. Regular ketchup and mustard. Meat flavorings and tenderizers. Bouillon cubes. Hot sauce and Tabasco sauce. Premade or packaged marinades. Premade or packaged taco seasonings. Relishes. Regular salad dressings. Salsa. Potato and tortilla chips. Corn chips and puffs. Salted popcorn and pretzels. Canned or dried soups. Pizza. Frozen entrees and pot pies. Summary  Eating less sodium can help lower your blood pressure, reduce swelling, and protect your heart, liver, and kidneys.  Most people on this plan should limit their sodium intake to 1,500-2,000 mg (milligrams) of sodium each day.  Canned, boxed, and frozen foods are high in sodium.  Restaurant foods, fast foods, and pizza are also very high in sodium. You also get sodium by adding salt to food.  Try to cook at home, eat more fresh fruits and vegetables, and eat less fast food, canned, processed, or prepared foods. This information is not intended to replace advice given to you by your health care provider. Make sure you discuss any questions you have with your health care provider. Document Released: 07/06/2001 Document Revised: 01/08/2016 Document Reviewed: 01/08/2016 Elsevier Interactive Patient Education  2017 [employer].

## 2016-05-15 NOTE — Progress Notes (Signed)
Patient ID: Tracey White, female    DOB: Aug 26, 1980, 36 y.o.   MRN: [id number]  HPI  Tracey White is a 36 y/o female with a history of anxiety, obstructive sleep apnea (with nasal CPAP), scoliosis, recent tobacco use and chronic heart failure.   Reviewed last echo report done 10/19/15 which showed an EF of 65-70% along with trivial TR.   Was in the ED 05/11/16 with a persistent cough due to HF exacerbation. Was in the ED 12/25/15 with leg edema.  She presents today for her initial visit with a chief complaint of moderate shortness of breath with little exertion. She says this has been going on for months and feels like it's worsening. When she feels short of breath, she has to stop what she's doing to rest until her breathing improves. She has associated fatigue, cough, wheezing, chest pain and bilateral leg swelling.   Past Medical History:  Diagnosis Date  . Anxiety   . Chronic heart failure (HCC)   . Morbid obesity (HCC)   . OSA (obstructive sleep apnea)    uses CPAP at home  . Peripheral edema   . Scoliosis    Past Surgical History:  Procedure Laterality Date  . HERNIA REPAIR    . scoliosis repair    . TUBAL LIGATION     Family History  Problem Relation Age of Onset  . Diabetes Mother   . Hypertension Mother    Social History  Substance Use Topics  . Smoking status: Former Smoker    Packs/day: 0.50    Types: Cigarettes    Quit date: 03/27/2016  . Smokeless tobacco: Never Used  . Alcohol use Yes     Comment: occasionally   Allergies  Allergen Reactions  . Aspirin Cough        Prior to Admission medications   Medication Sig Start Date End Date Taking? Authorizing Provider  albuterol (PROVENTIL HFA;VENTOLIN HFA) 108 (90 Base) MCG/ACT inhaler Inhale 2-4 puffs by mouth every 4 hours as needed for wheezing, cough, and/or shortness of breath 05/12/16  Yes Loleta Lynnae, MD  ALPRAZolam Prudy Feeler) 0.5 MG tablet Take 1 tablet by mouth 2 (two) times daily as needed. 11/14/15  Yes  Historical Provider, MD  beclomethasone (QVAR) 40 MCG/ACT inhaler Inhale 1 puff into the lungs 2 (two) times daily. 10/20/15  Yes Richard Renae Gloss, MD  Dextromethorphan-Guaifenesin (DELSYM COUGH/CHEST CONGEST DM) 5-100 MG/5ML LIQD Take 1 Dose by mouth every 4 (four) hours.   Yes Historical Provider, MD  Elastic Bandages & Supports (T.E.D. BELOW KNEE/LARGE) MISC 2 application by Does not apply route daily. 12/25/15  Yes Willy Eddy, MD  escitalopram (LEXAPRO) 20 MG tablet Take 20 mg by mouth daily.    Yes Historical Provider, MD  furosemide (LASIX) 20 MG tablet Take 40 mg by mouth 2 (two) times daily.  11/01/15  Yes Historical Provider, MD  metoprolol succinate (TOPROL-XL) 25 MG 24 hr tablet Take 1 tablet by mouth daily. 10/30/15  Yes Historical Provider, MD  torsemide (DEMADEX) 20 MG tablet Take 20 mg by mouth daily.   Yes Historical Provider, MD     Review of Systems  Constitutional: Positive for fatigue. Negative for appetite change.  HENT: Positive for congestion, postnasal drip and sore throat.   Eyes: Negative.   Respiratory: Positive for cough, shortness of breath and wheezing.   Cardiovascular: Positive for chest pain (when walking long distances) and leg swelling. Negative for palpitations.  Gastrointestinal: Negative for abdominal distention and abdominal pain.  Endocrine: Negative.   Genitourinary: Negative.   Musculoskeletal: Positive for back pain. Negative for neck pain.  Skin: Negative.   Allergic/Immunologic: Negative.   Neurological: Negative for dizziness and light-headedness.  Hematological: Negative for adenopathy. Does not bruise/bleed easily.  Psychiatric/Behavioral: Positive for sleep disturbance (CPAP pressure recently increased to 11). Negative for dysphoric mood. The patient is not nervous/anxious.    Vitals:   05/15/16 1059 05/15/16 1121  BP:  110/80  Pulse: (!) 110   Resp: (!) 22   SpO2: 92%   Weight: (!) 362 lb (164.2 kg)   Height:  (1.549 m)     Wt Readings from Last 3 Encounters:  05/15/16 (!) 362 lb (164.2 kg)  05/11/16 (!) 363 lb (164.7 kg)  12/25/15 (!) 344 lb (156 kg)   Physical Exam  Constitutional: She is oriented to person, place, and time. She appears well-developed and well-nourished.  HENT:  Head: Normocephalic and atraumatic.  Neck: Normal range of motion. Neck supple. No JVD present.  Cardiovascular: Regular rhythm.  Tachycardia present.   Pulmonary/Chest: Effort normal. She has wheezes in the right upper field and the left upper field. She has no rhonchi. She has no rales.  Abdominal: Soft. She exhibits no distension. There is no tenderness.  Musculoskeletal: She exhibits edema (3+ pitting edema in bilateral lower legs). She exhibits no tenderness.  Neurological: She is alert and oriented to person, place, and time.  Skin: Skin is warm and dry.  Psychiatric: She has a normal mood and affect. Her behavior is normal. Thought content normal.  Nursing note and vitals reviewed.   Assessment & Plan:  1: Chronic heart failure with preserved ejection fraction- - NYHA class III - moderately fluid overloaded - not weighing daily but does have scales at home. Instructed to call for an overnight weight gain of >2 pounds or a weekly weight gain of >5 pounds. - unsure of fluid intake and she was instructed to maintain fluid intake between 40-50 ounces of fluid daily - not adding salt but hasn't been reading food labels. Discussed the importance of following a  sodium diet and reviewed how to read food labels. Written dietary information was given to her - does wear TED socks at home and she was encouraged to elevate her legs when she could - had been taking furosemide and low dose torsemide. Advised to stop furosemide and will begin torsemide  twice daily - may also need some metolazone  - not currently taking potassium so will drawn a BMP today - reviewed BMP drawn on 02/20/16; potassium 3.8 and GFR 115  2:  Tachycardia- - may need to increase toprol XL in the future - patient says that she's gained 40 pounds since September 2017 - sees cardiologist [facility]) next week  3: Obstructive sleep apnea- - wearing CPAP nightly but using the nasal mask as the full mask makes her feel smothered - is a mouth breather though so nasal mask probably isn't going to work well. Encouraged her to try the full mask for short periods of time so that she can get used to it - saw PCP [facility]) last week and returns mid May  4: Tobacco use- - recently stopped smoking 03/27/16 - congratulated her on that and continued cessation was discussed for 3 minutes  Patient did not bring her medications nor a list. Each medication was verbally reviewed with the patient and she was encouraged to bring the bottles to every visit to confirm accuracy of list.  Return in  1 week and will repeat a BMP at that time since diuretic being adjusted today.

## 2016-05-15 NOTE — Telephone Encounter (Signed)
LM on patient's voicemail regarding lab work that was drawn today (05/15/16). Renal function looks good although potassium is 3.4.   Increasing torsemide to  twice daily so will send in potassium for her to begin taking twice daily as well.  Will recheck labs next week when she returns.

## 2016-05-22 ENCOUNTER — Ambulatory Visit: Payer: 59 | Attending: Family | Admitting: Family

## 2016-05-22 ENCOUNTER — Telehealth: Payer: Self-pay | Admitting: Family

## 2016-05-22 ENCOUNTER — Encounter: Payer: Self-pay | Admitting: Family

## 2016-05-22 VITALS — BP 118/90 | HR 103 | Resp 20 | Ht 61.0 in | Wt 352.1 lb

## 2016-05-22 DIAGNOSIS — M419 Scoliosis, unspecified: Secondary | ICD-10-CM | POA: Insufficient documentation

## 2016-05-22 DIAGNOSIS — R000 Tachycardia, unspecified: Secondary | ICD-10-CM | POA: Insufficient documentation

## 2016-05-22 DIAGNOSIS — Z79899 Other long term (current) drug therapy: Secondary | ICD-10-CM | POA: Insufficient documentation

## 2016-05-22 DIAGNOSIS — F419 Anxiety disorder, unspecified: Secondary | ICD-10-CM | POA: Insufficient documentation

## 2016-05-22 DIAGNOSIS — Z87891 Personal history of nicotine dependence: Secondary | ICD-10-CM | POA: Insufficient documentation

## 2016-05-22 DIAGNOSIS — Z720 Tobacco use: Secondary | ICD-10-CM

## 2016-05-22 DIAGNOSIS — I5032 Chronic diastolic (congestive) heart failure: Secondary | ICD-10-CM | POA: Diagnosis not present

## 2016-05-22 DIAGNOSIS — G4733 Obstructive sleep apnea (adult) (pediatric): Secondary | ICD-10-CM | POA: Diagnosis not present

## 2016-05-22 DIAGNOSIS — Z9989 Dependence on other enabling machines and devices: Secondary | ICD-10-CM

## 2016-05-22 LAB — BASIC METABOLIC PANEL
ANION GAP: 10 (ref 5–15)
BUN: 15 mg/dL (ref 6–20)
CHLORIDE: 96 mmol/L — AB (ref 101–111)
CO2: 32 mmol/L (ref 22–32)
Calcium: 9.1 mg/dL (ref 8.9–10.3)
Creatinine, Ser: 0.69 mg/dL (ref 0.44–1.00)
GFR calc non Af Amer: >60 mL/min (ref >60)
Glucose, Bld: 104 mg/dL — ABNORMAL HIGH (ref 65–99)
Potassium: 3.5 mmol/L (ref 3.5–5.1)
Sodium: 138 mmol/L (ref 135–145)

## 2016-05-22 NOTE — Telephone Encounter (Signed)
Reviewed lab work drawn today (05/22/16) with the patient. Continue medications at this time.

## 2016-05-22 NOTE — Patient Instructions (Signed)
Continue weighing daily and call for an overnight weight gain of > 2 pounds or a weekly weight gain of >5 pounds. 

## 2016-05-22 NOTE — Progress Notes (Signed)
Patient ID: Tracey White, female    DOB: [birth year], 36 y.o.   MRN: [id number]  HPI  Tracey White is a 36 y/o female with a history of anxiety, obstructive sleep apnea (with nasal CPAP), scoliosis, recent tobacco use and chronic heart failure.   Reviewed last echo report done 10/19/15 which showed an EF of 65-70% along with trivial TR.   Was in the ED 05/11/16 with a persistent cough due to HF exacerbation. Was in the ED 12/25/15 with leg edema.  She presents today for a follow-up visit with a chief complaint of mild shortness of breath with moderate exertion. She says that this is chronic in nature and has been occurring for a year or so. She says that it is improving since diuretic was changed last time. She has associated fatigue, cough and postnasal drip.   Past Medical History:  Diagnosis Date  . Anxiety   . Chronic heart failure (HCC)   . Morbid obesity (HCC)   . OSA (obstructive sleep apnea)    uses CPAP at home  . Peripheral edema   . Scoliosis    Past Surgical History:  Procedure Laterality Date  . HERNIA REPAIR    . scoliosis repair    . TUBAL LIGATION     Family History  Problem Relation Age of Onset  . Diabetes Mother   . Hypertension Mother    Social History  Substance Use Topics  . Smoking status: Former Smoker    Packs/day: 0.50    Types: Cigarettes    Quit date: 03/27/2016  . Smokeless tobacco: Never Used  . Alcohol use Yes     Comment: occasionally   Allergies  Allergen Reactions  . Aspirin Cough        Prior to Admission medications   Medication Sig Start Date End Date Taking? Authorizing Provider  albuterol (PROVENTIL HFA;VENTOLIN HFA) 108 (90 Base) MCG/ACT inhaler Inhale 2-4 puffs by mouth every 4 hours as needed for wheezing, cough, and/or shortness of breath 05/12/16  Yes Loleta Sumiya, MD  ALPRAZolam Prudy Feeler) 0.5 MG tablet Take 1 tablet by mouth 2 (two) times daily as needed. 11/14/15  Yes Historical Provider, MD  beclomethasone (QVAR) 40 MCG/ACT  inhaler Inhale 1 puff into the lungs 2 (two) times daily. 10/20/15  Yes Richard Renae Gloss, MD  Dextromethorphan-Guaifenesin (DELSYM COUGH/CHEST CONGEST DM) 5-100 MG/5ML LIQD Take 1 Dose by mouth every 4 (four) hours.   Yes Historical Provider, MD  Elastic Bandages & Supports (T.E.D. BELOW KNEE/LARGE) MISC 2 application by Does not apply route daily. 12/25/15  Yes Willy Eddy, MD  escitalopram (LEXAPRO) 20 MG tablet Take 20 mg by mouth daily.    Yes Historical Provider, MD  furosemide (LASIX) 20 MG tablet Take 40 mg by mouth 2 (two) times daily.  11/01/15  Yes Historical Provider, MD  metoprolol succinate (TOPROL-XL) 25 MG 24 hr tablet Take 1 tablet by mouth daily. 10/30/15  Yes Historical Provider, MD  torsemide (DEMADEX) 20 MG tablet Take 20 mg by mouth daily.   Yes Historical Provider, MD   Review of Systems  Constitutional: Positive for fatigue (improving). Negative for appetite change.  HENT: Positive for congestion and postnasal drip. Negative for sore throat.   Eyes: Negative.   Respiratory: Positive for cough and shortness of breath (improving). Negative for chest tightness.   Cardiovascular: Negative for chest pain, palpitations and leg swelling.  Gastrointestinal: Negative for abdominal distention and abdominal pain.  Endocrine: Negative.   Genitourinary: Negative.   Musculoskeletal:  Positive for back pain. Negative for neck pain.  Skin: Negative.   Allergic/Immunologic: Negative.   Neurological: Negative for dizziness and light-headedness.  Hematological: Negative for adenopathy. Does not bruise/bleed easily.  Psychiatric/Behavioral: Positive for sleep disturbance (needs new CPAP mask fitting). Negative for dysphoric mood. The patient is not nervous/anxious.    Vitals:   05/22/16 0923  Pulse: (!) 103  Resp: 20  SpO2: 98%  Weight: (!) 352 lb 2 oz (159.7 kg)  Height:  (1.549 m)   Wt Readings from Last 3 Encounters:  05/22/16 (!) 352 lb 2 oz (159.7 kg)  05/15/16 (!) 362  lb (164.2 kg)  05/11/16 (!) 363 lb (164.7 kg)   Physical Exam  Constitutional: She is oriented to person, place, and time. She appears well-developed and well-nourished.  HENT:  Head: Normocephalic and atraumatic.  Neck: Normal range of motion. Neck supple. No JVD present.  Cardiovascular: Regular rhythm.  Tachycardia present.   Pulmonary/Chest: Effort normal. She has no wheezes. She has no rhonchi. She has no rales.  Abdominal: Soft. She exhibits no distension. There is no tenderness.  Musculoskeletal: She exhibits edema (trace pitting edema in bilateral lower legs ). She exhibits no tenderness.  Neurological: She is alert and oriented to person, place, and time.  Skin: Skin is warm and dry.  Psychiatric: She has a normal mood and affect. Her behavior is normal. Thought content normal.  Nursing note and vitals reviewed.   Assessment & Plan:  1: Chronic heart failure with preserved ejection fraction- - NYHA class II - euvolemic today - home weight chart reviewed and shows a gradual reduction. Has last 9.8 pounds by our scale since she was last here.  Instructed to call for an overnight weight gain of >2 pounds or a weekly weight gain of >5 pounds. - continue to maintain fluid intake between 40-50 ounces of fluid daily - not adding salt and has been trying to read food labels more often - does wear TED socks at home and she was encouraged to elevate her legs when she could - edema markedly better since furosemide stopped and is now just taking torsemide - BMP drawn today  2: Tachycardia- - may need to increase toprol XL in the future - patient says that she's gained 40 pounds since September 2017  - sees cardiologist Juliann Pares) later today  3: Obstructive sleep apnea- - wearing CPAP nightly but using the nasal mask as the full mask makes her feel smothered - is a mouth breather though so nasal mask probably isn't going to work well. Encouraged her to try the full mask for short  periods of time so that she can get used to it - saw PCP [facility]) 2 weeks ago and returns mid May - prescription written for her to get CPAP mask fitting done  4: Tobacco use- - recently stopped smoking 03/27/16 - congratulated her on that and continued cessation was discussed for 3 minutes  Medication bottles were reviewed.   Return in 1 month or sooner for any questions/problems before then.

## 2016-05-30 ENCOUNTER — Telehealth: Payer: Self-pay

## 2016-05-30 ENCOUNTER — Emergency Department: Payer: 59

## 2016-05-30 ENCOUNTER — Encounter: Payer: Self-pay | Admitting: Emergency Medicine

## 2016-05-30 DIAGNOSIS — Z6841 Body Mass Index (BMI) 40.0 and over, adult: Secondary | ICD-10-CM | POA: Diagnosis not present

## 2016-05-30 DIAGNOSIS — Z886 Allergy status to analgesic agent status: Secondary | ICD-10-CM | POA: Diagnosis not present

## 2016-05-30 DIAGNOSIS — I5043 Acute on chronic combined systolic (congestive) and diastolic (congestive) heart failure: Secondary | ICD-10-CM | POA: Diagnosis not present

## 2016-05-30 DIAGNOSIS — G4733 Obstructive sleep apnea (adult) (pediatric): Secondary | ICD-10-CM | POA: Diagnosis not present

## 2016-05-30 DIAGNOSIS — F419 Anxiety disorder, unspecified: Secondary | ICD-10-CM | POA: Diagnosis not present

## 2016-05-30 DIAGNOSIS — E876 Hypokalemia: Secondary | ICD-10-CM | POA: Insufficient documentation

## 2016-05-30 DIAGNOSIS — R079 Chest pain, unspecified: Secondary | ICD-10-CM | POA: Diagnosis present

## 2016-05-30 DIAGNOSIS — I110 Hypertensive heart disease with heart failure: Principal | ICD-10-CM | POA: Insufficient documentation

## 2016-05-30 LAB — BASIC METABOLIC PANEL
Anion gap: 11 (ref 5–15)
BUN: 11 mg/dL (ref 6–20)
CALCIUM: 9.2 mg/dL (ref 8.9–10.3)
CO2: 31 mmol/L (ref 22–32)
Chloride: 97 mmol/L — ABNORMAL LOW (ref 101–111)
Creatinine, Ser: 0.84 mg/dL (ref 0.44–1.00)
GFR calc Af Amer: >60 mL/min (ref >60)
Glucose, Bld: 101 mg/dL — ABNORMAL HIGH (ref 65–99)
POTASSIUM: 3.3 mmol/L — AB (ref 3.5–5.1)
SODIUM: 139 mmol/L (ref 135–145)

## 2016-05-30 LAB — CBC
HEMATOCRIT: 32.6 % — AB (ref 35.0–47.0)
Hemoglobin: 10.3 g/dL — ABNORMAL LOW (ref 12.0–16.0)
MCH: 22.8 pg — ABNORMAL LOW (ref 26.0–34.0)
MCHC: 31.6 g/dL — ABNORMAL LOW (ref 32.0–36.0)
MCV: 72.1 fL — ABNORMAL LOW (ref 80.0–100.0)
PLATELETS: 297 10*3/uL (ref 150–440)
RBC: 4.52 MIL/uL (ref 3.80–5.20)
RDW: 18.2 % — AB (ref 11.5–14.5)
WBC: 10.7 10*3/uL (ref 3.6–11.0)

## 2016-05-30 LAB — BRAIN NATRIURETIC PEPTIDE: B Natriuretic Peptide: 13 pg/mL (ref 0.0–100.0)

## 2016-05-30 LAB — TROPONIN I

## 2016-05-30 IMAGING — CR DG CHEST 2V
2 series · 2 of 2 positions shown · non-contrast
Comparison: [DATE].

CLINICAL DATA: Chest pain and shortness of breath since yesterday.

EXAM:
CHEST  2 VIEW

[chest pa]
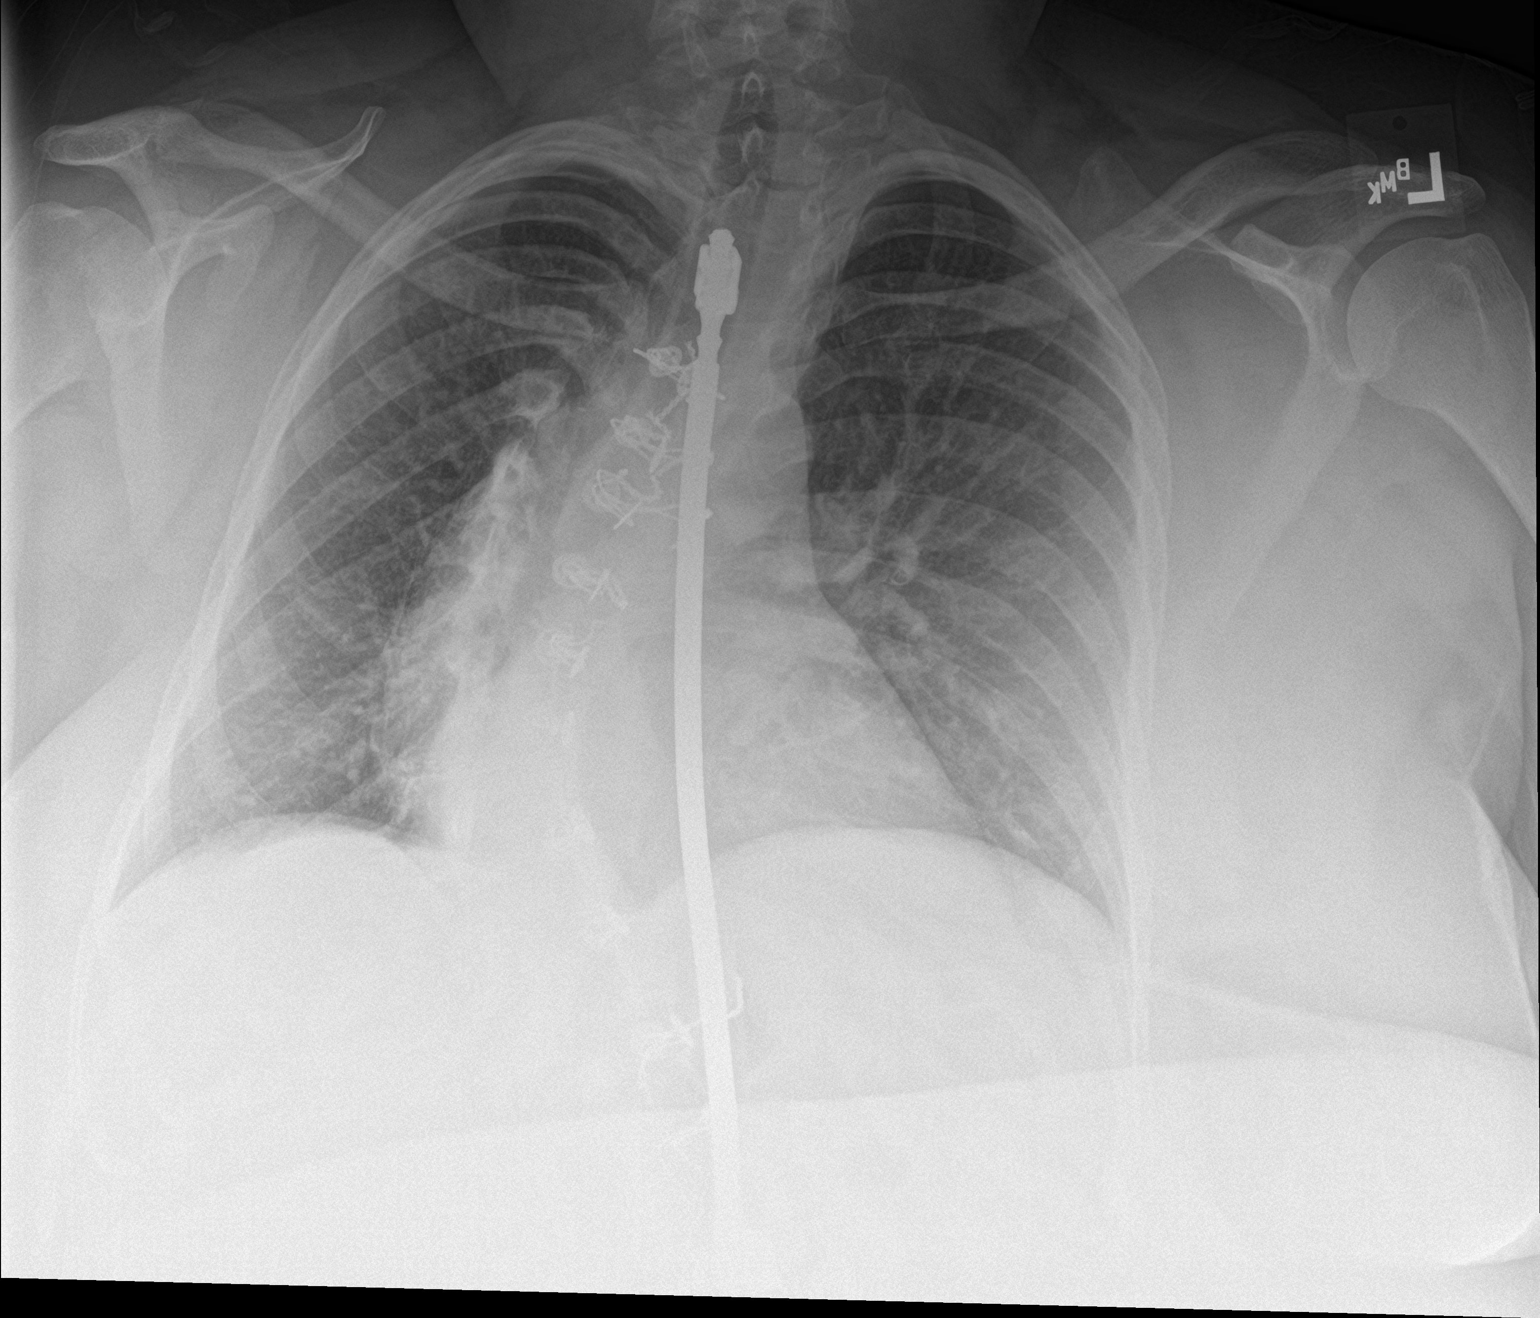

[chest lat]
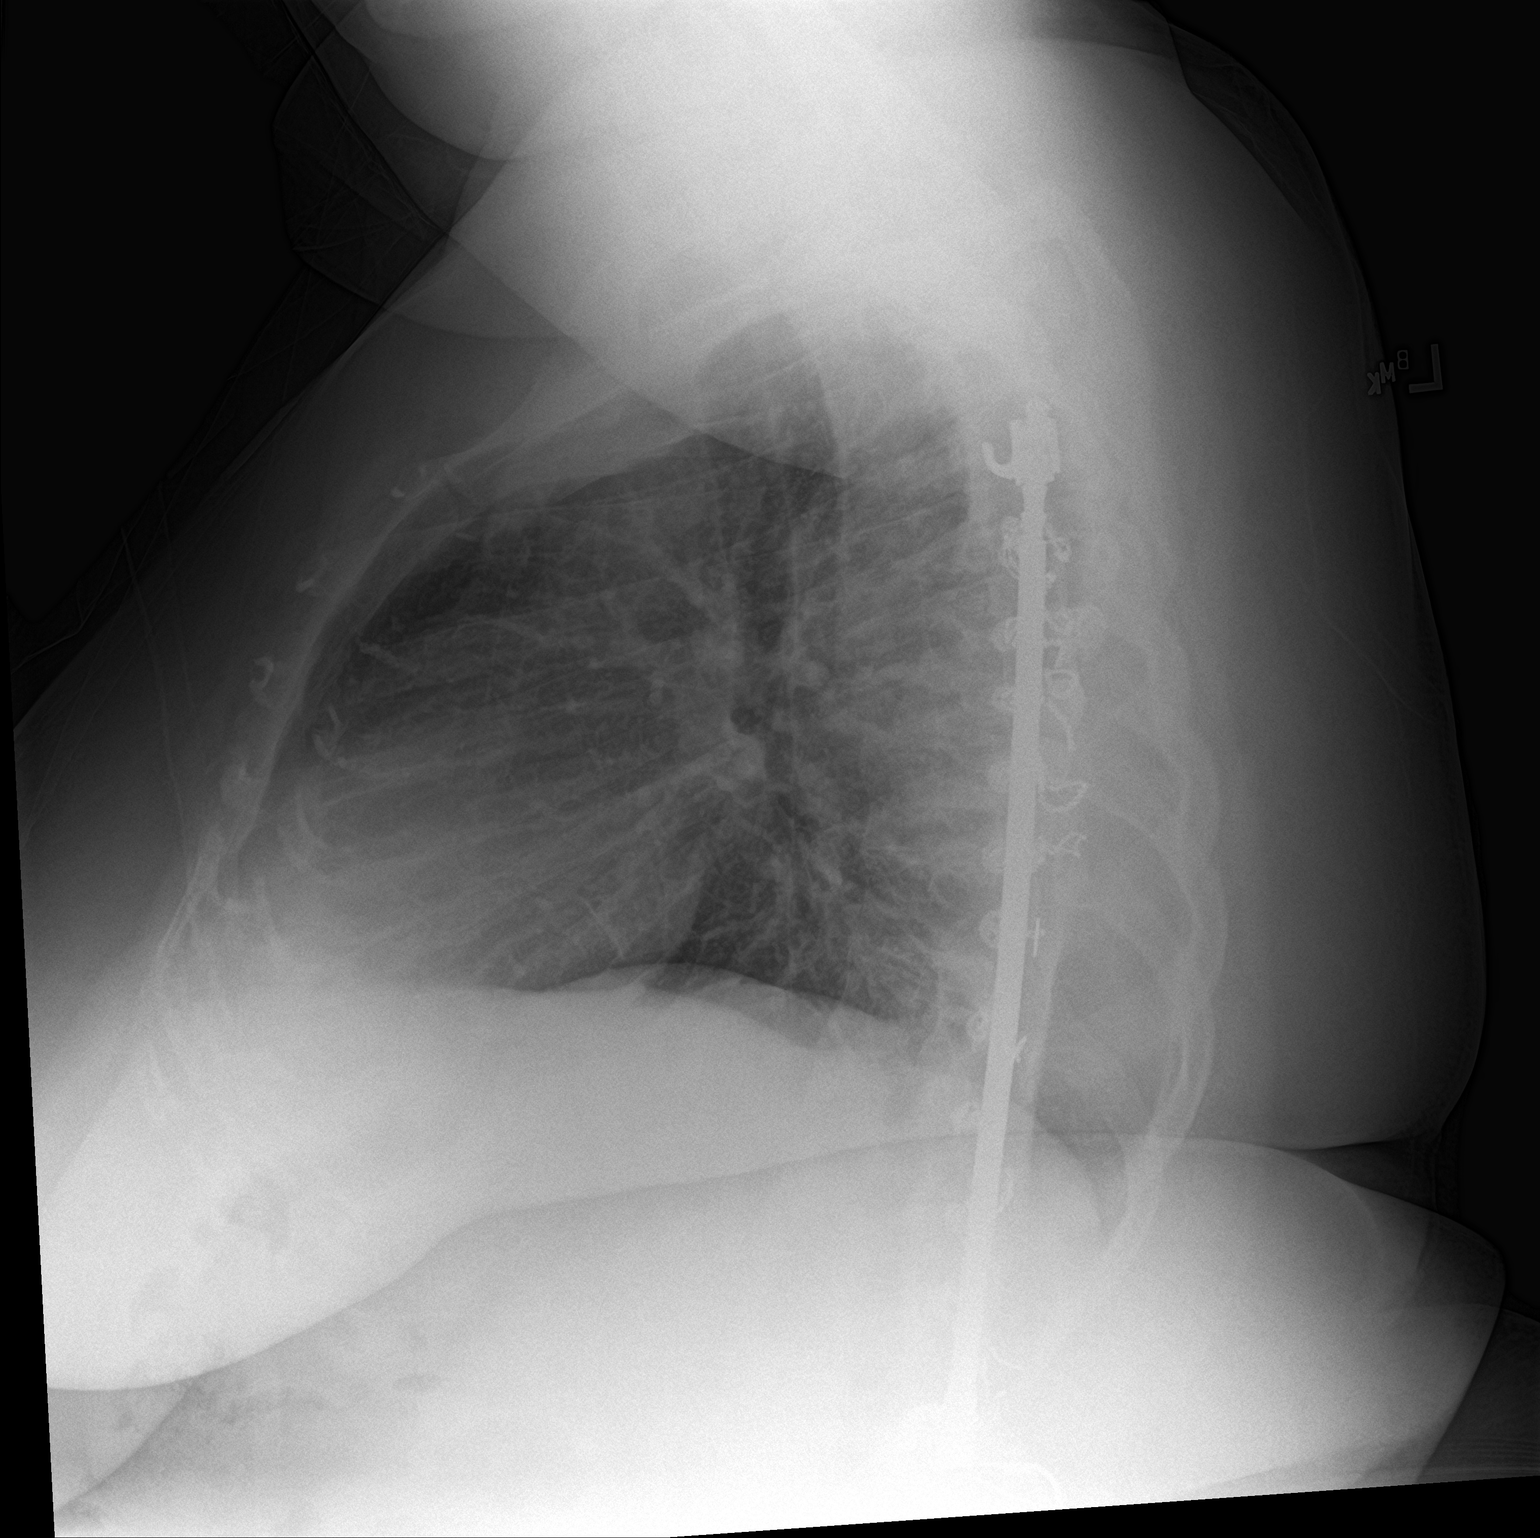

[2 of 2 positions shown; findings below may reference images not displayed]

FINDINGS: The cardiac silhouette remains borderline enlarged. The pulmonary
vasculature remains mildly prominent with stable mild central
peribronchial thickening. Moderate dextroconvex lumbar scoliosis and
fixation rod and wires, unchanged.
IMPRESSION: No acute abnormality. Stable borderline cardiomegaly, mild pulmonary
vascular congestion and mild bronchitic changes.

## 2016-05-30 MED ORDER — METOLAZONE 5 MG PO TABS: 5.0000 mg | ORAL_TABLET | Freq: Every day | ORAL | 0 refills | Status: DC

## 2016-05-30 MED ORDER — ALBUTEROL SULFATE (2.5 MG/3ML) 0.083% IN NEBU
INHALATION_SOLUTION | RESPIRATORY_TRACT | Status: AC
  Administered 2016-05-30: 5 mg via RESPIRATORY_TRACT
  Filled 2016-05-30: qty 6

## 2016-05-30 MED ORDER — ALBUTEROL SULFATE (2.5 MG/3ML) 0.083% IN NEBU
5.0000 mg | INHALATION_SOLUTION | Freq: Once | RESPIRATORY_TRACT | Status: AC
  Administered 2016-05-30: 5 mg via RESPIRATORY_TRACT

## 2016-05-30 NOTE — Telephone Encounter (Signed)
[name] called the office complaining of a weight gain. She states that she was 353 lbs last week and is now 359 lbs and is having increased shortness of breath and cough.  I spoke with Clarisa Kindredina Hackney she would like the patient to take Metolazone 5 mg for the next three days, as well as increase her potassium to 40 MeQ twice daily for those three days. She would like her to follow up in the clinic tomorrow.    I scheduled the patient to come into the clinic tomorrow at 12:20 PM but advised that if her breathing gets worse or she is experiencing chest pain that she should be seen at the ED.

## 2016-05-30 NOTE — ED Triage Notes (Addendum)
Pt ambulatory to triage in NAD, report cp and sob starting yesterday, hx chf, report prescribed extra fluid pill by doctor which she took this afternoon w/o relief.  Reports taking inhaler with little relief as well.

## 2016-05-31 ENCOUNTER — Observation Stay
Admission: EM | Admit: 2016-05-31 | Discharge: 2016-06-01 | Disposition: A | Discharge status: Home / Self Care | Payer: 59 | Attending: Internal Medicine | Admitting: Internal Medicine

## 2016-05-31 ENCOUNTER — Ambulatory Visit: Payer: Self-pay | Admitting: Family

## 2016-05-31 DIAGNOSIS — R079 Chest pain, unspecified: Secondary | ICD-10-CM | POA: Diagnosis present

## 2016-05-31 LAB — TROPONIN I
Troponin I: <0.03 ng/mL (ref <0.03)
Troponin I: <0.03 ng/mL (ref <0.03)
Troponin I: <0.03 ng/mL (ref <0.03)

## 2016-05-31 LAB — TSH: TSH: 2.614 u[IU]/mL (ref 0.350–4.500)

## 2016-05-31 MED ORDER — ALBUTEROL SULFATE (2.5 MG/3ML) 0.083% IN NEBU: 3.0000 mL | INHALATION_SOLUTION | RESPIRATORY_TRACT | Status: DC | PRN

## 2016-05-31 MED ORDER — ACETAMINOPHEN 325 MG PO TABS
650.0000 mg | ORAL_TABLET | Freq: Four times a day (QID) | ORAL | Status: DC | PRN
  Administered 2016-05-31 – 2016-06-01 (×5): 650 mg via ORAL
  Filled 2016-05-31 (×5): qty 2

## 2016-05-31 MED ORDER — GABAPENTIN 100 MG PO CAPS
100.0000 mg | ORAL_CAPSULE | Freq: Three times a day (TID) | ORAL | Status: DC
  Administered 2016-05-31 (×2): 200 mg via ORAL
  Administered 2016-05-31: 100 mg via ORAL
  Administered 2016-06-01: 200 mg via ORAL
  Filled 2016-05-31: qty 2
  Filled 2016-05-31: qty 1
  Filled 2016-05-31 (×2): qty 2

## 2016-05-31 MED ORDER — ENOXAPARIN SODIUM 40 MG/0.4ML ~~LOC~~ SOLN
40.0000 mg | Freq: Two times a day (BID) | SUBCUTANEOUS | Status: DC
  Administered 2016-05-31 (×2): 40 mg via SUBCUTANEOUS
  Filled 2016-05-31 (×3): qty 0.4

## 2016-05-31 MED ORDER — POTASSIUM CHLORIDE CRYS ER 20 MEQ PO TBCR
20.0000 meq | EXTENDED_RELEASE_TABLET | Freq: Two times a day (BID) | ORAL | Status: DC
  Administered 2016-05-31 – 2016-06-01 (×3): 20 meq via ORAL
  Filled 2016-05-31 (×3): qty 1

## 2016-05-31 MED ORDER — METOLAZONE 2.5 MG PO TABS
5.0000 mg | ORAL_TABLET | Freq: Every day | ORAL | Status: DC
  Administered 2016-05-31 – 2016-06-01 (×2): 5 mg via ORAL
  Filled 2016-05-31 (×2): qty 2

## 2016-05-31 MED ORDER — ONDANSETRON HCL 4 MG/2ML IJ SOLN
4.0000 mg | Freq: Four times a day (QID) | INTRAMUSCULAR | Status: DC | PRN
Start: 2016-05-31 — End: 2016-06-01
  Administered 2016-05-31: 4 mg via INTRAVENOUS
  Filled 2016-05-31: qty 2

## 2016-05-31 MED ORDER — IPRATROPIUM-ALBUTEROL 0.5-2.5 (3) MG/3ML IN SOLN
RESPIRATORY_TRACT | Status: AC
  Administered 2016-05-31: 3 mL via RESPIRATORY_TRACT
  Filled 2016-05-31: qty 3

## 2016-05-31 MED ORDER — BECLOMETHASONE DIPROPIONATE 40 MCG/ACT IN AERS: 1.0000 | INHALATION_SPRAY | Freq: Two times a day (BID) | RESPIRATORY_TRACT | Status: DC

## 2016-05-31 MED ORDER — ALPRAZOLAM 0.5 MG PO TABS
0.5000 mg | ORAL_TABLET | Freq: Every day | ORAL | Status: DC | PRN
  Administered 2016-05-31 – 2016-06-01 (×2): 0.5 mg via ORAL
  Filled 2016-05-31 (×2): qty 1

## 2016-05-31 MED ORDER — ONDANSETRON HCL 4 MG PO TABS
4.0000 mg | ORAL_TABLET | Freq: Four times a day (QID) | ORAL | Status: DC | PRN
  Filled 2016-05-31: qty 1

## 2016-05-31 MED ORDER — MORPHINE SULFATE (PF) 4 MG/ML IV SOLN
4.0000 mg | Freq: Once | INTRAVENOUS | Status: AC
  Administered 2016-05-31: 4 mg via INTRAVENOUS
  Filled 2016-05-31: qty 1

## 2016-05-31 MED ORDER — IPRATROPIUM-ALBUTEROL 0.5-2.5 (3) MG/3ML IN SOLN
3.0000 mL | Freq: Once | RESPIRATORY_TRACT | Status: DC
  Filled 2016-05-31: qty 3

## 2016-05-31 MED ORDER — ESCITALOPRAM OXALATE 10 MG PO TABS
20.0000 mg | ORAL_TABLET | Freq: Every day | ORAL | Status: DC
  Administered 2016-05-31 – 2016-06-01 (×2): 20 mg via ORAL
  Filled 2016-05-31 (×2): qty 2

## 2016-05-31 MED ORDER — TORSEMIDE 20 MG PO TABS
40.0000 mg | ORAL_TABLET | Freq: Two times a day (BID) | ORAL | Status: DC
  Administered 2016-05-31: 40 mg via ORAL
  Filled 2016-05-31: qty 2

## 2016-05-31 MED ORDER — BUDESONIDE 0.25 MG/2ML IN SUSP
0.2500 mg | Freq: Two times a day (BID) | RESPIRATORY_TRACT | Status: DC
  Administered 2016-05-31 – 2016-06-01 (×3): 0.25 mg via RESPIRATORY_TRACT
  Filled 2016-05-31 (×2): qty 2

## 2016-05-31 MED ORDER — DOCUSATE SODIUM 100 MG PO CAPS
100.0000 mg | ORAL_CAPSULE | Freq: Two times a day (BID) | ORAL | Status: DC
  Administered 2016-05-31 – 2016-06-01 (×3): 100 mg via ORAL
  Filled 2016-05-31 (×3): qty 1

## 2016-05-31 MED ORDER — ACETAMINOPHEN 650 MG RE SUPP: 650.0000 mg | Freq: Four times a day (QID) | RECTAL | Status: DC | PRN

## 2016-05-31 MED ORDER — MONTELUKAST SODIUM 10 MG PO TABS
10.0000 mg | ORAL_TABLET | Freq: Every day | ORAL | Status: DC
  Administered 2016-05-31: 10 mg via ORAL
  Filled 2016-05-31: qty 1

## 2016-05-31 MED ORDER — T.E.D. BELOW KNEE/LARGE MISC: 2.0000 "application " | Freq: Every day | Status: DC

## 2016-05-31 MED ORDER — FUROSEMIDE 10 MG/ML IJ SOLN
60.0000 mg | Freq: Three times a day (TID) | INTRAMUSCULAR | Status: DC
  Administered 2016-05-31 – 2016-06-01 (×4): 60 mg via INTRAVENOUS
  Filled 2016-05-31 (×4): qty 6

## 2016-05-31 MED ORDER — METOPROLOL SUCCINATE ER 25 MG PO TB24
25.0000 mg | ORAL_TABLET | Freq: Every day | ORAL | Status: DC
  Administered 2016-05-31 – 2016-06-01 (×2): 25 mg via ORAL
  Filled 2016-05-31 (×2): qty 1

## 2016-05-31 MED ORDER — IPRATROPIUM-ALBUTEROL 0.5-2.5 (3) MG/3ML IN SOLN
3.0000 mL | Freq: Once | RESPIRATORY_TRACT | Status: AC
  Administered 2016-05-31: 3 mL via RESPIRATORY_TRACT

## 2016-05-31 MED ORDER — LOSARTAN POTASSIUM 50 MG PO TABS
25.0000 mg | ORAL_TABLET | Freq: Every day | ORAL | Status: DC
  Administered 2016-05-31 – 2016-06-01 (×2): 25 mg via ORAL
  Filled 2016-05-31 (×2): qty 1

## 2016-05-31 MED ORDER — FUROSEMIDE 10 MG/ML IJ SOLN
60.0000 mg | Freq: Once | INTRAMUSCULAR | Status: AC
  Administered 2016-05-31: 60 mg via INTRAVENOUS
  Filled 2016-05-31: qty 8

## 2016-05-31 NOTE — Consult Note (Signed)
[facility] CLINIC CARDIOLOGY [facility] PRACTICE  CARDIOLOGY CONSULT NOTE  Patient ID: Tracey White MRN: [id number] DOB/AGE: 08/05/1980 36 y.o.  Admit date: 05/31/2016 Referring Physician Dr. Elpidio Anis Primary Physician   Primary Cardiologist Dr. Juliann Pares Reason for Consultation chest pain  HPI: Pt is a 36 yo female with history of known mild cardiomyopathy with ef of 45%, mrobid obesity who presented to the er with complaints of increasing sob and mid sternal and left body chest pain.She states she had improved when changing form lasix to demedex but had started gaining more weight again. She reports compliance with her meds and attempts to eat a low sodium diet but has had difficulty losing weight. She has ruled out for an mi. EKG is unremarkable. CXR showed mild pulmonary edema.   Review of Systems  Constitutional: Negative.   HENT: Negative.   Eyes: Negative.   Respiratory: Positive for shortness of breath.   Cardiovascular: Positive for chest pain and leg swelling.  Gastrointestinal: Negative.   Genitourinary: Negative.   Musculoskeletal: Positive for myalgias.  Skin: Negative.   Neurological: Negative.   Endo/Heme/Allergies: Negative.   Psychiatric/Behavioral: The patient is nervous/anxious.     Past Medical History:  Diagnosis Date  . Anxiety   . Chronic heart failure (HCC)   . Morbid obesity (HCC)   . OSA (obstructive sleep apnea)    uses CPAP at home  . Peripheral edema   . Scoliosis     Family History  Problem Relation Age of Onset  . Diabetes Mother   . Hypertension Mother     Social History   Social History  . Marital status: Single    Spouse name: N/A  . Number of children: N/A  . Years of education: N/A   Occupational History  . Not on file.   Social History Main Topics  . Smoking status: Former Smoker    Packs/day: 0.50    Types: Cigarettes    Quit date: 03/27/2016  . Smokeless tobacco: Never Used  . Alcohol use Yes     Comment:  occasionally  . Drug use: No  . Sexual activity: No   Other Topics Concern  . Not on file   Social History Narrative  . No narrative on file    Past Surgical History:  Procedure Laterality Date  . HERNIA REPAIR    . scoliosis repair    . TUBAL LIGATION       Prescriptions Prior to Admission  Medication Sig Dispense Refill Last Dose  . albuterol (PROVENTIL HFA;VENTOLIN HFA) 108 (90 Base) MCG/ACT inhaler Inhale 2-4 puffs by mouth every 4 hours as needed for wheezing, cough, and/or shortness of breath 1 Inhaler 1 Taking  . ALPRAZolam (XANAX) 0.5 MG tablet Take 1 tablet by mouth daily as needed for anxiety.    05/30/2016 at Unknown time  . beclomethasone (QVAR) 40 MCG/ACT inhaler Inhale 1 puff into the lungs 2 (two) times daily. 1 Inhaler 0 Taking  . escitalopram (LEXAPRO) 20 MG tablet Take 20 mg by mouth daily.    05/30/2016 at Unknown time  . gabapentin (NEURONTIN) 100 MG capsule Take 100-200 mg by mouth 3 (three) times daily.   05/30/2016 at Unknown time  . lidocaine (LIDODERM) 5 % Place 1 patch onto the skin daily. Remove & Discard patch within 12 hours or as directed by MD   prn  . losartan (COZAAR) 25 MG tablet Take 25 mg by mouth daily.     . meloxicam (MOBIC)  15 MG tablet Take 15 mg by mouth daily.   05/30/2016 at Unknown time  . metolazone (ZAROXOLYN) 5 MG tablet Take 1 tablet (5 mg total) by mouth daily. 3 tablet 0 05/30/2016 at Unknown time  . metoprolol succinate (TOPROL-XL) 25 MG 24 hr tablet Take 1 tablet by mouth daily.   05/30/2016 at Unknown time  . montelukast (SINGULAIR) 10 MG tablet Take 10 mg by mouth at bedtime.   05/30/2016 at Unknown time  . potassium chloride SA (K-DUR,KLOR-CON) 20 MEQ tablet Take 1 tablet (20 mEq total) by mouth 2 (two) times daily. 60 tablet 5 05/30/2016 at Unknown time  . torsemide (DEMADEX) 20 MG tablet Take 2 tablets (40 mg total) by mouth 2 (two) times daily. 120 tablet 5 05/30/2016 at Unknown time  . Elastic Bandages & Supports (T.E.D. BELOW KNEE/LARGE) MISC  2 application by Does not apply route daily. 4 each 0 Taking    Physical Exam: Blood pressure (!) 111/58, pulse 79, temperature 98.1 F (36.7 C), temperature source Oral, resp. rate 20, height 5\' 1"  (1.549 m), weight (!) 157.8 kg (347 lb 12.8 oz), last menstrual period 05/24/2016, SpO2 94 %.   Wt Readings from Last 1 Encounters:  05/31/16 (!) 157.8 kg (347 lb 12.8 oz)     General appearance: cooperative, distracted and morbidly obese Resp: rhonchi bilaterally Cardio: regular rate and rhythm GI: soft, non-tender; bowel sounds normal; no masses,  no organomegaly Extremities: extremities normal, atraumatic, no cyanosis or edema Neurologic: Grossly normal  Labs:   Lab Results  Component Value Date   WBC 10.7 05/30/2016   HGB 10.3 (L) 05/30/2016   HCT 32.6 (L) 05/30/2016   MCV 72.1 (L) 05/30/2016   PLT 297 05/30/2016    Recent Labs Lab 05/30/16 2044  NA 139  K 3.3*  CL 97*  CO2 31  BUN 11  CREATININE 0.84  CALCIUM 9.2  GLUCOSE 101*   Lab Results  Component Value Date   TROPONINI <0.03 05/31/2016      Radiology: mild pulmonary edema EKG: nsr with no ischmeia  ASSESSMENT AND PLAN:  Pt is a 36 yo female with complaints of chest and arm and head pain. Pain is fairly atypical for angina. She states her pain is worse when she gets up or moves. CXR showed borderline cardiomegaly with mil pulmonary vascular congestion. EKG showed nsr with nssttw changes. She has ruled out for an mi thus far.Will review echo when available. Continue iv lasix and metolazone. Continue losartan, metoprolol . Will follow with you. Signed: Dalia HeadingKenneth A Marijke Guadiana MD, [facility] Hospital[facility]ACC 05/31/2016, 4:13 PM

## 2016-05-31 NOTE — Progress Notes (Signed)
Lovenox changed to 40 mg BID for BMI >40 and CrCl >30. 

## 2016-05-31 NOTE — H&P (Signed)
Tracey White is an 36 y.o. female.   Chief Complaint: Chest pain HPI: The patient with past medical history of congestive heart failure presents to the emergency department complaining of chest pain and shortness of breath. The patient reports that she has had more frequent exacerbations of her CHF over the last 9 months. Recently she was placed on torsemide as well as Lasix by her heart failure specialist. She improved symptomatically and lost approximately 14 pounds on this regimen but gained it back over the last few days. Now she complains of substernal chest pain that radiates into her left shoulder and into her left upper arm. She denies shortness of breath, nausea, vomiting or diaphoresis. However she admits to blurry vision. She denies difficulty swallowing or speaking and she denies weakness in any of her extremities. The patient was given Lasix 60 mg IV in the emergency department as well as 2 breathing treatments prior to the hospitalist service and called for admission.  Past Medical History:  Diagnosis Date  . Anxiety   . Chronic heart failure ([place])   . Morbid obesity ([place])   . OSA (obstructive sleep apnea)    uses CPAP at home  . Peripheral edema   . Scoliosis     Past Surgical History:  Procedure Laterality Date  . HERNIA REPAIR    . scoliosis repair    . TUBAL LIGATION      Family History  Problem Relation Age of Onset  . Diabetes Mother   . Hypertension Mother    Social History:  reports that she quit smoking about 2 months ago. Her smoking use included Cigarettes. She smoked 0.50 packs per day. She has never used smokeless tobacco. She reports that she drinks alcohol. She reports that she does not use drugs.  Allergies:  Allergies  Allergen Reactions  . Aspirin Cough         Medications Prior to Admission  Medication Sig Dispense Refill  . albuterol (PROVENTIL HFA;VENTOLIN HFA) 108 (90 Base) MCG/ACT inhaler Inhale 2-4 puffs by mouth every 4 hours as needed for  wheezing, cough, and/or shortness of breath 1 Inhaler 1  . ALPRAZolam (XANAX) 0.5 MG tablet Take 1 tablet by mouth daily as needed for anxiety.     . beclomethasone (QVAR) 40 MCG/ACT inhaler Inhale 1 puff into the lungs 2 (two) times daily. 1 Inhaler 0  . escitalopram (LEXAPRO) 20 MG tablet Take 20 mg by mouth daily.     Marland Kitchen gabapentin (NEURONTIN) 100 MG capsule Take 100-200 mg by mouth 3 (three) times daily.    Marland Kitchen lidocaine (LIDODERM) 5 % Place 1 patch onto the skin daily. Remove & Discard patch within 12 hours or as directed by MD    . losartan (COZAAR) 25 MG tablet Take 25 mg by mouth daily.    . meloxicam (MOBIC) 15 MG tablet Take 15 mg by mouth daily.    . metolazone (ZAROXOLYN) 5 MG tablet Take 1 tablet (5 mg total) by mouth daily. 3 tablet 0  . metoprolol succinate (TOPROL-XL) 25 MG 24 hr tablet Take 1 tablet by mouth daily.    . montelukast (SINGULAIR) 10 MG tablet Take 10 mg by mouth at bedtime.    . potassium chloride SA (K-DUR,KLOR-CON) 20 MEQ tablet Take 1 tablet (20 mEq total) by mouth 2 (two) times daily. 60 tablet 5  . torsemide (DEMADEX) 20 MG tablet Take 2 tablets (40 mg total) by mouth 2 (two) times daily. 120 tablet 5  . Elastic  Bandages & Supports (T.E.D. BELOW KNEE/LARGE) MISC 2 application by Does not apply route daily. 4 each 0    Results for orders placed or performed during the hospital encounter of 05/31/16 (from the past 48 hour(s))  Basic metabolic panel     Status: Abnormal   Collection Time: 05/30/16  8:44 PM  Result Value Ref Range   Sodium 139 135 - 145 mmol/L   Potassium 3.3 (L) 3.5 - 5.1 mmol/L   Chloride 97 (L) 101 - 111 mmol/L   CO2 31 22 - 32 mmol/L   Glucose, Bld 101 (H) 65 - 99 mg/dL   BUN 11 6 - 20 mg/dL   Creatinine, Ser 0.84 0.44 - 1.00 mg/dL   Calcium 9.2 8.9 - 10.3 mg/dL   GFR calc non Af Amer >60 >60 mL/min   GFR calc Af Amer >60 >60 mL/min    Comment: (NOTE) The eGFR has been calculated using the CKD EPI equation. This calculation has not  been validated in all clinical situations. eGFR's persistently <60 mL/min signify possible Chronic Kidney Disease.    Anion gap 11 5 - 15  CBC     Status: Abnormal   Collection Time: 05/30/16  8:44 PM  Result Value Ref Range   WBC 10.7 3.6 - 11.0 K/uL   RBC 4.52 3.80 - 5.20 MIL/uL   Hemoglobin 10.3 (L) 12.0 - 16.0 g/dL   HCT 32.6 (L) 35.0 - 47.0 %   MCV 72.1 (L) 80.0 - 100.0 fL   MCH 22.8 (L) 26.0 - 34.0 pg   MCHC 31.6 (L) 32.0 - 36.0 g/dL   RDW 18.2 (H) 11.5 - 14.5 %   Platelets 297 150 - 440 K/uL  Troponin I     Status: None   Collection Time: 05/30/16  8:44 PM  Result Value Ref Range   Troponin I <0.03 <0.03 ng/mL  Brain natriuretic peptide     Status: None   Collection Time: 05/30/16  8:44 PM  Result Value Ref Range   B Natriuretic Peptide 13.0 0.0 - 100.0 pg/mL  TSH     Status: None   Collection Time: 05/31/16  1:58 AM  Result Value Ref Range   TSH 2.614 0.350 - 4.500 uIU/mL    Comment: Performed by a 3rd Generation assay with a functional sensitivity of <=0.01 uIU/mL.  Troponin I     Status: None   Collection Time: 05/31/16  1:58 AM  Result Value Ref Range   Troponin I <0.03 <0.03 ng/mL   Dg Chest 2 View  Result Date: 05/30/2016 CLINICAL DATA:  Chest pain and shortness of breath since yesterday. EXAM: CHEST  2 VIEW COMPARISON:  05/11/2016. FINDINGS: The cardiac silhouette remains borderline enlarged. The pulmonary vasculature remains mildly prominent with stable mild central peribronchial thickening. Moderate dextroconvex lumbar scoliosis and fixation rod and wires, unchanged. IMPRESSION: No acute abnormality. Stable borderline cardiomegaly, mild pulmonary vascular congestion and mild bronchitic changes. Electronically Signed   By: Claudie Revering M.D.   On: 05/30/2016 21:23    Review of Systems  Constitutional: Negative for chills and fever.  HENT: Negative for sore throat and tinnitus.   Eyes: Negative for blurred vision and redness.  Respiratory: Positive for  shortness of breath. Negative for cough.   Cardiovascular: Positive for chest pain. Negative for palpitations, orthopnea and PND.  Gastrointestinal: Negative for abdominal pain, diarrhea, nausea and vomiting.  Genitourinary: Negative for dysuria, frequency and urgency.  Musculoskeletal: Negative for joint pain and myalgias.  Skin: Negative for  rash.       No lesions  Neurological: Negative for speech change, focal weakness and weakness.  Endo/Heme/Allergies: Does not bruise/bleed easily.       No temperature intolerance  Psychiatric/Behavioral: Negative for depression and suicidal ideas.    Blood pressure 93/67, pulse (!) 53, temperature 98.1 F (36.7 C), temperature source Oral, resp. rate 18, height 5' 1"  (1.549 m), weight (!) 157.8 kg (347 lb 12.8 oz), last menstrual period 05/24/2016, SpO2 95 %. Physical Exam  Vitals reviewed. Constitutional: She is oriented to person, place, and time. She appears well-developed and well-nourished. No distress.  HENT:  Head: Normocephalic and atraumatic.  Mouth/Throat: Oropharynx is clear and moist.  Eyes: Conjunctivae and EOM are normal. Pupils are equal, round, and reactive to light. No scleral icterus.  Neck: Normal range of motion. Neck supple. No tracheal deviation present. No thyromegaly present.  Cardiovascular: Normal rate, regular rhythm and normal heart sounds.  Exam reveals no gallop and no friction rub.   No murmur heard. Respiratory: Effort normal. She has wheezes.  GI: Soft. Bowel sounds are normal. She exhibits no distension. There is no tenderness.  Genitourinary:  Genitourinary Comments: Deferred  Musculoskeletal: Normal range of motion. She exhibits edema.  Lymphadenopathy:    She has no cervical adenopathy.  Neurological: She is alert and oriented to person, place, and time. No cranial nerve deficit. She exhibits normal muscle tone.  Skin: Skin is warm and dry. No rash noted. No erythema.  Psychiatric: She has a normal mood  and affect. Her behavior is normal. Judgment and thought content normal.     Assessment/Plan This is a 36 year old female with chest pain and CHF exacerbation. 1. Chest pain: EKG without signs of ischemia. Cardiac biomarkers negative. Continue to monitor telemetry. 2. Congestive heart failure: Acute on chronic systolic (although likely combined). Most recent echo from September 2017 shows hyperdynamic systolic function. Repeat echo and consult cardiology. Continue diuretic regimen. 3. Hypokalemia: Replete potassium 4. Hypertension: Continue losartan 5. Morbid obesity: BMI is 68; encouraged healthy diet and exercise 6. OSA: Nocturnal CPAP 7. DVT prophylaxis: Lovenox 8. GI prophylaxis: None The patient is a full code. Time spent on admission orders and patient care approximately 45 minutes  Harrie Foreman, MD 05/31/2016, 8:29 AM

## 2016-05-31 NOTE — Plan of Care (Signed)
Problem: Physical Regulation: Goal: Ability to maintain clinical measurements within normal limits will improve Outcome: Progressing K+ low today, being replaced. Jari FavreSteven M Medina Memorial Hospitalmhoff

## 2016-05-31 NOTE — Care Management Note (Signed)
Case Management Note  Patient Details  Name: Tracey White MRN: [id number] Date of Birth: 11-21-1980  Subjective/Objective:                 Placed in observation for chest pain and exac of CHF.  has home CPAP.  O2 requirement acute.  Is followed by Heart Failure Clinic.  Current with her pcp and no issues accessing medical care.    Action/Plan:   Discussed need to perform home 02 assessment during progression  Expected Discharge Date:                  Expected Discharge Plan:     In-House Referral:     Discharge planning Services     Post Acute Care Choice:    Choice offered to:     DME Arranged:    DME Agency:     HH Arranged:    HH Agency:     Status of Service:     If discussed at [employer] Meetings, dates discussed:    Additional Comments:  Tracey White, Tracey Dubberly R, RN 05/31/2016, 11:20 AM

## 2016-05-31 NOTE — Progress Notes (Signed)
Same day note  Patient seen and examined  Still complains of on and off chest tightness. SOB. On Torsemide at home with metalazone started at CHF clinic  * Acute on chronic diastolic chf * Chest pain likely due to CHF - Troponin x 3 negative * Morbid obesity  - IV Lasix - Input and Output - Counseled to limit fluids and Salt - Monitor Bun/Cr and Potassium - Echo -Cardiology consulted

## 2016-05-31 NOTE — Progress Notes (Signed)
Pt placed on [facility] CPAP C-3. CPAP plugged into red outlet 

## 2016-06-01 LAB — HEMOGLOBIN A1C
Hgb A1c MFr Bld: 6.1 % — ABNORMAL HIGH (ref 4.8–5.6)
Mean Plasma Glucose: 128 mg/dL

## 2016-06-01 NOTE — Progress Notes (Signed)
Pt to be discharged today. Iv and tele removed. disch instructions  Given to pt to her understanding.

## 2016-06-01 NOTE — Discharge Summary (Signed)
[facility] [place] at [facility]   PATIENT NAME: Tracey White    MR#:  [id number]  DATE OF BIRTH:  01/18/1981  DATE OF ADMISSION:  05/31/2016 ADMITTING PHYSICIAN: Arnaldo Natal, MD  DATE OF DISCHARGE: 06/01/2016  PRIMARY CARE PHYSICIAN: Kandyce Rud, MD    ADMISSION DIAGNOSIS:  chest pain sob  DISCHARGE DIAGNOSIS:   Acute on chronic  diastolic mild CHF OSA Morbid obesity  SECONDARY DIAGNOSIS:   Past Medical History:  Diagnosis Date  . Anxiety   . Chronic heart failure (HCC)   . Morbid obesity (HCC)   . OSA (obstructive sleep apnea)    uses CPAP at home  . Peripheral edema   . Scoliosis     HOSPITAL COURSE:  36 year old female with chest pain and CHF exacerbation.  1. Chest pain: EKG without signs of ischemia. Cardiac biomarkers negative.  -appears atypical pain  2. Congestive heart failure: Acute on chronic systolic (although likely combined). Most recent echo from September 2017 shows hyperdynamic systolic function. - Continue diuretic regimen with IV lasix. Diuresed well. sats >92% on RA -pt advised low salt diet Appreciated Cardiology input -f/u CHF clinic  3. Hypokalemia: Replete potassium  4. Hypertension: Continue losartan  5. Morbid obesity: BMI is 68; encouraged healthy diet and exercise  6. OSA: Nocturnal CPAP  7. DVT prophylaxis: Lovenox  Overall at baseline D/c home  CONSULTS OBTAINED:  Treatment Team:  Dalia Heading, MD  DRUG ALLERGIES:   Allergies  Allergen Reactions  . Aspirin Cough         DISCHARGE MEDICATIONS:   Current Discharge Medication List    CONTINUE these medications which have NOT CHANGED   Details  albuterol (PROVENTIL HFA;VENTOLIN HFA) 108 (90 Base) MCG/ACT inhaler Inhale 2-4 puffs by mouth every 4 hours as needed for wheezing, cough, and/or shortness of breath Qty: 1 Inhaler, Refills: 1    ALPRAZolam (XANAX) 0.5 MG tablet Take 1 tablet by mouth daily as needed for anxiety.      beclomethasone (QVAR) 40 MCG/ACT inhaler Inhale 1 puff into the lungs 2 (two) times daily. Qty: 1 Inhaler, Refills: 0    escitalopram (LEXAPRO) 20 MG tablet Take 20 mg by mouth daily.     gabapentin (NEURONTIN) 100 MG capsule Take 100-200 mg by mouth 3 (three) times daily.    lidocaine (LIDODERM) 5 % Place 1 patch onto the skin daily. Remove & Discard patch within 12 hours or as directed by MD    losartan (COZAAR) 25 MG tablet Take 25 mg by mouth daily.    meloxicam (MOBIC) 15 MG tablet Take 15 mg by mouth daily.    metolazone (ZAROXOLYN) 5 MG tablet Take 1 tablet (5 mg total) by mouth daily. Qty: 3 tablet, Refills: 0    metoprolol succinate (TOPROL-XL) 25 MG 24 hr tablet Take 1 tablet by mouth daily.    montelukast (SINGULAIR) 10 MG tablet Take 10 mg by mouth at bedtime.    potassium chloride SA (K-DUR,KLOR-CON) 20 MEQ tablet Take 1 tablet (20 mEq total) by mouth 2 (two) times daily. Qty: 60 tablet, Refills: 5    torsemide (DEMADEX) 20 MG tablet Take 2 tablets (40 mg total) by mouth 2 (two) times daily. Qty: 120 tablet, Refills: 5    Elastic Bandages & Supports (T.E.D. BELOW KNEE/LARGE) MISC 2 application by Does not apply route daily. Qty: 4 each, Refills: 0        If you experience worsening of your admission symptoms, develop shortness  of breath, life threatening emergency, suicidal or homicidal thoughts you must seek medical attention immediately by calling 911 or calling your MD immediately  if symptoms less severe.  You Must read complete instructions/literature along with all the possible adverse reactions/side effects for all the Medicines you take and that have been prescribed to you. Take any new Medicines after you have completely understood and accept all the possible adverse reactions/side effects.   Please note  You were cared for by a hospitalist during your hospital stay. If you have any questions about your discharge medications or the care you received  while you were in the hospital after you are discharged, you can call the unit and asked to speak with the hospitalist on call if the hospitalist that took care of you is not available. Once you are discharged, your primary care physician will handle any further medical issues. Please note that NO REFILLS for any discharge medications will be authorized once you are discharged, as it is imperative that you return to your primary care physician (or establish a relationship with a primary care physician if you do not have one) for your aftercare needs so that they can reassess your need for medications and monitor your lab values. Today   SUBJECTIVE   Feels ok  VITAL SIGNS:  Blood pressure (!) 111/49, pulse 74, temperature 98.5 F (36.9 C), temperature source Oral, resp. rate 16, height 5\' 1"  (1.549 m), weight (!) 156 kg (344 lb), last menstrual period 05/24/2016, SpO2 90 %.  I/O:   Intake/Output Summary (Last 24 hours) at 06/01/16 0959 Last data filed at 06/01/16 0958  Gross per 24 hour  Intake              120 ml  Output              800 ml  Net             -680 ml    PHYSICAL EXAMINATION:  GENERAL:  36 y.o.-year-old patient lying in the bed with no acute distress. obese EYES: Pupils equal, round, reactive to light and accommodation. No scleral icterus. Extraocular muscles intact.  HEENT: Head atraumatic, normocephalic. Oropharynx and nasopharynx clear.  NECK:  Supple, no jugular venous distention. No thyroid enlargement, no tenderness.  LUNGS: Normal breath sounds bilaterally, no wheezing, rales,rhonchi or crepitation. No use of accessory muscles of respiration.  CARDIOVASCULAR: S1, S2 normal. No murmurs, rubs, or gallops.  ABDOMEN: Soft, non-tender, non-distended. Bowel sounds present. No organomegaly or mass.  EXTREMITIES: No pedal edema, cyanosis, or clubbing.  NEUROLOGIC: Cranial nerves II through XII are intact. Muscle strength 5/5 in all extremities. Sensation intact. Gait not  checked.  PSYCHIATRIC: The patient is alert and oriented x 3.  SKIN: No obvious rash, lesion, or ulcer.   DATA REVIEW:   CBC   Recent Labs Lab 05/30/16 2044  WBC 10.7  HGB 10.3*  HCT 32.6*  PLT 297    Chemistries   Recent Labs Lab 05/30/16 2044  NA 139  K 3.3*  CL 97*  CO2 31  GLUCOSE 101*  BUN 11  CREATININE 0.84  CALCIUM 9.2    Microbiology Results   No results found for this or any previous visit (from the past 240 hour(s)).  RADIOLOGY:  Dg Chest 2 View  Result Date: 05/30/2016 CLINICAL DATA:  Chest pain and shortness of breath since yesterday. EXAM: CHEST  2 VIEW COMPARISON:  05/11/2016. FINDINGS: The cardiac silhouette remains borderline enlarged. The pulmonary vasculature remains  mildly prominent with stable mild central peribronchial thickening. Moderate dextroconvex lumbar scoliosis and fixation rod and wires, unchanged. IMPRESSION: No acute abnormality. Stable borderline cardiomegaly, mild pulmonary vascular congestion and mild bronchitic changes. Electronically Signed   By: Beckie SaltsSteven  Reid M.D.   On: 05/30/2016 21:23     Management plans discussed with the patient, family and they are in agreement.  CODE STATUS:     Code Status Orders        Start     Ordered   05/31/16 0543  Full code  Continuous     05/31/16 0542    Code Status History    Date Active Date Inactive Code Status Order ID Comments User Context   10/18/2015  7:56 AM 10/19/2015  8:36 AM Full Code [id number]  Alford HighlandWieting, Richard, MD ED      TOTAL TIME TAKING CARE OF THIS PATIENT: *40* minutes.    Raymont Andreoni M.D on 06/01/2016 at 9:59 AM  Between 7am to 6pm - Pager - [phone] After 6pm go to [email or web address] - Social research officer, [email or web address] [email or web address]  [facility]  Office  [phone]  CC: Primary care physician; Kandyce RudBabaoff, Marcus, MD

## 2016-06-01 NOTE — Discharge Instructions (Signed)
Pt advised to f/u with the CHF clinic  Use CPAP as before

## 2016-06-01 NOTE — Progress Notes (Signed)
[facility]    Tracey White was admitted to the Hospital on 05/31/2016 and Discharged  06/01/2016 and should be excused from work/school   for 1  days starting 05/31/2016 , may return to work/school without any restrictions.  Call Enedina FinnerSona Finlay Mills MD, [facility]  [phone] with questions.  Luverta Korte M.D on 06/01/2016,at 12:26 PM

## 2016-06-06 ENCOUNTER — Encounter: Payer: Self-pay | Admitting: Family

## 2016-06-06 ENCOUNTER — Ambulatory Visit: Payer: 59 | Attending: Family | Admitting: Family

## 2016-06-06 VITALS — BP 110/80 | HR 95 | Resp 20 | Ht 61.0 in | Wt 346.5 lb

## 2016-06-06 DIAGNOSIS — R079 Chest pain, unspecified: Secondary | ICD-10-CM | POA: Insufficient documentation

## 2016-06-06 DIAGNOSIS — Z87891 Personal history of nicotine dependence: Secondary | ICD-10-CM | POA: Diagnosis not present

## 2016-06-06 DIAGNOSIS — M419 Scoliosis, unspecified: Secondary | ICD-10-CM | POA: Insufficient documentation

## 2016-06-06 DIAGNOSIS — R0602 Shortness of breath: Secondary | ICD-10-CM | POA: Insufficient documentation

## 2016-06-06 DIAGNOSIS — Z9889 Other specified postprocedural states: Secondary | ICD-10-CM | POA: Insufficient documentation

## 2016-06-06 DIAGNOSIS — R42 Dizziness and giddiness: Secondary | ICD-10-CM | POA: Diagnosis not present

## 2016-06-06 DIAGNOSIS — Z833 Family history of diabetes mellitus: Secondary | ICD-10-CM | POA: Insufficient documentation

## 2016-06-06 DIAGNOSIS — Z888 Allergy status to other drugs, medicaments and biological substances status: Secondary | ICD-10-CM | POA: Diagnosis not present

## 2016-06-06 DIAGNOSIS — R05 Cough: Secondary | ICD-10-CM | POA: Diagnosis not present

## 2016-06-06 DIAGNOSIS — G4733 Obstructive sleep apnea (adult) (pediatric): Secondary | ICD-10-CM | POA: Insufficient documentation

## 2016-06-06 DIAGNOSIS — E876 Hypokalemia: Secondary | ICD-10-CM | POA: Diagnosis not present

## 2016-06-06 DIAGNOSIS — I5032 Chronic diastolic (congestive) heart failure: Secondary | ICD-10-CM | POA: Diagnosis not present

## 2016-06-06 DIAGNOSIS — Z79899 Other long term (current) drug therapy: Secondary | ICD-10-CM | POA: Insufficient documentation

## 2016-06-06 DIAGNOSIS — Z9989 Dependence on other enabling machines and devices: Secondary | ICD-10-CM

## 2016-06-06 DIAGNOSIS — Z8249 Family history of ischemic heart disease and other diseases of the circulatory system: Secondary | ICD-10-CM | POA: Insufficient documentation

## 2016-06-06 DIAGNOSIS — F419 Anxiety disorder, unspecified: Secondary | ICD-10-CM | POA: Insufficient documentation

## 2016-06-06 NOTE — Patient Instructions (Signed)
Continue weighing daily and call for an overnight weight gain of > 2 pounds or a weekly weight gain of >5 pounds. 

## 2016-06-06 NOTE — Progress Notes (Signed)
Patient ID: Tracey White, female    DOB: 1980-04-05, 36 y.o.   MRN: [id number]  HPI  [name] is a 36 y/o female with a history of anxiety, obstructive sleep apnea (with nasal CPAP), scoliosis, recent tobacco use and chronic heart failure.   Reviewed last echo report done 10/19/15 which showed an EF of 65-70% along with trivial TR.   Admitted 05/31/16 with chest pain and HF exacerbation. Initially treated with IV furosemide and transitioned to oral diuretics. Cardiology consult obtained. Discharged the following day. Was in the ED 05/11/16 with a persistent cough due to HF exacerbation. Was in the ED 12/25/15 with leg edema.  She presents today for a follow-up visit with a chief complaint of mild shortness of breath with moderate exertion. She says that this is chronic in nature and has been occurring for a year or so. She has associated fatigue, cough and light-headedness with this.   Past Medical History:  Diagnosis Date  . Anxiety   . Chronic heart failure (HCC)   . Morbid obesity (HCC)   . OSA (obstructive sleep apnea)    uses CPAP at home  . Peripheral edema   . Scoliosis    Past Surgical History:  Procedure Laterality Date  . HERNIA REPAIR    . scoliosis repair    . TUBAL LIGATION     Family History  Problem Relation Age of Onset  . Diabetes Mother   . Hypertension Mother    Social History  Substance Use Topics  . Smoking status: Former Smoker    Packs/day: 0.50    Types: Cigarettes    Quit date: 03/27/2016  . Smokeless tobacco: Never Used  . Alcohol use Yes     Comment: occasionally   Allergies  Allergen Reactions  . Aspirin Cough        Prior to Admission medications   Medication Sig Start Date End Date Taking? Authorizing Provider  albuterol (PROVENTIL HFA;VENTOLIN HFA) 108 (90 Base) MCG/ACT inhaler Inhale 2-4 puffs by mouth every 4 hours as needed for wheezing, cough, and/or shortness of breath 05/12/16  Yes Loleta Clarrissa, MD  ALPRAZolam Prudy Feeler) 0.5 MG tablet  Take 1 tablet by mouth daily as needed for anxiety.  11/14/15  Yes [provider]  beclomethasone (QVAR) 40 MCG/ACT inhaler Inhale 1 puff into the lungs 2 (two) times daily. 10/20/15  Yes Alford Highland, MD  Elastic Bandages & Supports (T.E.D. BELOW KNEE/LARGE) MISC 2 application by Does not apply route daily. 12/25/15  Yes Willy Eddy, MD  escitalopram (LEXAPRO) 20 MG tablet Take 20 mg by mouth daily.    Yes [provider]  gabapentin (NEURONTIN) 100 MG capsule Take 100-200 mg by mouth 3 (three) times daily.   Yes [provider]  lidocaine (LIDODERM) 5 % Place 1 patch onto the skin daily. Remove & Discard patch within 12 hours or as directed by MD   Yes [provider]  losartan (COZAAR) 25 MG tablet Take 25 mg by mouth daily.   Yes [provider]  meloxicam (MOBIC) 15 MG tablet Take 15 mg by mouth daily.   Yes [provider]  metoprolol succinate (TOPROL-XL) 25 MG 24 hr tablet Take 1 tablet by mouth daily. 10/30/15  Yes [provider]  montelukast (SINGULAIR) 10 MG tablet Take 10 mg by mouth at bedtime.   Yes [provider]  potassium chloride SA (K-DUR,KLOR-CON) 20 MEQ tablet Take 1 tablet (20 mEq total) by mouth 2 (two) times daily.  05/15/16 07/14/16 Yes Hackney, Jarold Songina A, FNP  torsemide (DEMADEX) 20 MG tablet Take 2 tablets (40 mg total) by mouth 2 (two) times daily. 05/15/16  Yes Delma FreezeHackney, Tina A, FNP           Review of Systems  Constitutional: Positive for fatigue. Negative for appetite change.  HENT: Positive for congestion. Negative for postnasal drip and sore throat.   Eyes: Negative.   Respiratory: Positive for cough and shortness of breath. Negative for chest tightness and wheezing.   Cardiovascular: Negative for chest pain, palpitations and leg swelling.  Gastrointestinal: Negative for abdominal distention and abdominal pain.  Endocrine: Negative.   Genitourinary: Negative.   Musculoskeletal: Positive  for back pain. Negative for neck pain.  Skin: Negative.   Allergic/Immunologic: Negative.   Neurological: Positive for light-headedness (when coughing too much). Negative for dizziness.  Hematological: Negative for adenopathy. Does not bruise/bleed easily.  Psychiatric/Behavioral: Positive for sleep disturbance (getting adjusted to new CPAP mask). Negative for dysphoric mood. The patient is not nervous/anxious.    Vitals:   06/06/16 0848  BP: 110/80  Pulse: 95  Resp: 20  SpO2: 96%  Weight: (!) 346 lb 8 oz (157.2 kg)  Height: 5\' 1"  (1.549 m)   Wt Readings from Last 3 Encounters:  06/06/16 (!) 346 lb 8 oz (157.2 kg)  06/01/16 (!) 344 lb (156 kg)  05/22/16 (!) 352 lb 2 oz (159.7 kg)   Lab Results  Component Value Date   CREATININE 0.84 05/30/2016   CREATININE 0.69 05/22/2016   CREATININE 0.78 05/15/2016    Physical Exam  Constitutional: She is oriented to person, place, and time. She appears well-developed and well-nourished.  HENT:  Head: Normocephalic and atraumatic.  Neck: Normal range of motion. Neck supple. No JVD present.  Cardiovascular: Regular rhythm.  Tachycardia present.   Pulmonary/Chest: Effort normal. She has no wheezes. She has no rhonchi. She has no rales.  Abdominal: Soft. She exhibits no distension. There is no tenderness.  Musculoskeletal: She exhibits no edema or tenderness.  Neurological: She is alert and oriented to person, place, and time.  Skin: Skin is warm and dry.  Psychiatric: She has a normal mood and affect. Her behavior is normal. Thought content normal.  Nursing note and vitals reviewed.   Assessment & Plan:  1: Chronic heart failure with preserved ejection fraction- - NYHA class II - euvolemic today - weight down 4.6 pounds since she was here last.  Instructed to call for an overnight weight gain of >2 pounds or a weekly weight gain of >5 pounds. - continue to maintain fluid intake between 40-50 ounces of fluid daily - not adding salt  and has been trying to read food labels more often - does wear TED socks at home and she was encouraged to elevate her legs when she could - recently started physical therapy - finished previous metolazone after she was discharged - saw cardiologist Juliann Pares([place]) 05/22/16  2: Hypokalemia- - BMP drawn on 05/30/16 reviewed and shows a potassium of 3.3 - prescription written for a BMP to be drawn today at her PCP's office as she was running late to that appointment and didn't have time to get it drawn here  3: Obstructive sleep apnea- - wearing CPAP nightly and is trying to get used to her new mask - taking alprazolam at bedtime to help get used to the mask but she says it makes her feel groggy in the mornings - instructed her that she could try breaking the alprazolam  in half to see if that helps with her anxiety but allows her to wake up easier - sees PCP [facility]) later today   Medication bottles were reviewed.   Return in 1 month or sooner for any questions/problems before then.

## 2016-06-13 ENCOUNTER — Telehealth: Payer: Self-pay | Admitting: Family

## 2016-06-13 NOTE — Telephone Encounter (Signed)
Called patient to follow-up regarding BMP that was drawn at PCP's office on 06/06/16. Noted that potassium level was low at 2.8.   Patient is currently taking 20meq potassium BID along with 40mg  torsemide BID. She says that [facility] and has been experiencing some tingling in her fingers.  Advised patient to increase her potassium to 40meq BID for the next 3 days. Will recheck lab work on 06/17/16. Patient was appreciative of the call and verbalized understanding of instructions.

## 2016-06-15 NOTE — ED Provider Notes (Signed)
[facility] Emergency Department Provider Note    First MD Initiated Contact with Patient 05/31/16 [phone]     (approximate)  I have reviewed the triage vital signs and the nursing notes.   HISTORY  Chief Complaint Chest Pain and Shortness of Breath   HPI Tracey White is a 36 y.o. female with bolus of chronic medical conditions including congestive heart 5 presents to the emergency department with increasing bilateral lower extremity swelling and shortness of breath over the past week. Patient also admits to cough no fever. Patient states that she was recently switched to Torosemide from Lasix. Patient states the dyspnea is worse with laying flat (orthopnea) and exertion. Patient denies any chest pain   Past Medical History:  Diagnosis Date  . Anxiety   . Chronic heart failure (HCC)   . Morbid obesity (HCC)   . OSA (obstructive sleep apnea)    uses CPAP at home  . Peripheral edema   . Scoliosis     Patient Active Problem List   Diagnosis Date Noted  . Hypokalemia 06/06/2016  . Chest pain 05/31/2016  . Chronic diastolic heart failure (HCC) 05/15/2016  . Tachycardia 05/15/2016  . Obstructive sleep apnea on CPAP 05/15/2016  . Tobacco use 05/15/2016  . Chest pain at rest 10/18/2015    Past Surgical History:  Procedure Laterality Date  . HERNIA REPAIR    . scoliosis repair    . TUBAL LIGATION      Prior to Admission medications   Medication Sig Start Date End Date Taking? Authorizing Provider  albuterol (PROVENTIL HFA;VENTOLIN HFA) 108 (90 Base) MCG/ACT inhaler Inhale 2-4 puffs by mouth every 4 hours as needed for wheezing, cough, and/or shortness of breath 05/12/16  Yes Loleta Korin, MD  ALPRAZolam Prudy Feeler) 0.5 MG tablet Take 1 tablet by mouth daily as needed for anxiety.  11/14/15  Yes [provider]  beclomethasone (QVAR) 40 MCG/ACT inhaler Inhale 1 puff into the lungs 2 (two) times daily. 10/20/15  Yes Wieting, Richard, MD    escitalopram (LEXAPRO) 20 MG tablet Take 20 mg by mouth daily.    Yes [provider]  gabapentin (NEURONTIN) 100 MG capsule Take 100-200 mg by mouth 3 (three) times daily.   Yes [provider]  lidocaine (LIDODERM) 5 % Place 1 patch onto the skin daily. Remove & Discard patch within 12 hours or as directed by MD   Yes [provider]  losartan (COZAAR) 25 MG tablet Take 25 mg by mouth daily.   Yes [provider]  meloxicam (MOBIC) 15 MG tablet Take 15 mg by mouth daily.   Yes [provider]  metolazone (ZAROXOLYN) 5 MG tablet Take 1 tablet (5 mg total) by mouth daily. 05/30/16 06/02/16 Yes Clarisa Kindred A, FNP  metoprolol succinate (TOPROL-XL) 25 MG 24 hr tablet Take 1 tablet by mouth daily. 10/30/15  Yes [provider]  montelukast (SINGULAIR) 10 MG tablet Take 10 mg by mouth at bedtime.   Yes [provider]  potassium chloride SA (K-DUR,KLOR-CON) 20 MEQ tablet Take 1 tablet (20 mEq total) by mouth 2 (two) times daily. 05/15/16 07/14/16 Yes Hackney, Jarold Song, FNP  torsemide (DEMADEX) 20 MG tablet Take 2 tablets (40 mg total) by mouth 2 (two) times daily. 05/15/16  Yes Delma Freeze, FNP  Elastic Bandages & Supports (T.E.D. BELOW KNEE/LARGE) MISC 2 application by Does not apply route daily. 12/25/15   Willy Eddy, MD    Allergies Aspirin  Family  History  Problem Relation Age of Onset  . Diabetes Mother   . Hypertension Mother     Social History Social History  Substance Use Topics  . Smoking status: Former Smoker    Packs/day: 0.50    Types: Cigarettes    Quit date: 03/27/2016  . Smokeless tobacco: Never Used  . Alcohol use Yes     Comment: occasionally    Review of Systems Constitutional: No fever/chills Eyes: No visual changes. ENT: No sore throat. Cardiovascular: Denies chest pain. Respiratory: Positive for shortness of breath. Gastrointestinal: No abdominal pain.  No nausea, no vomiting.  No diarrhea.  No  constipation. Genitourinary: Negative for dysuria. Musculoskeletal: Negative for neck pain.  Negative for back pain. Positive for bilateral leg swelling Integumentary: Negative for rash. Neurological: Negative for headaches, focal weakness or numbness.   ____________________________________________   PHYSICAL EXAM:  VITAL SIGNS: ED Triage Vitals  Enc Vitals Group     BP 05/30/16 2045 (!) 118/101     Pulse Rate 05/30/16 2045 95     Resp 05/30/16 2045 20     Temp 05/30/16 2045 99 F (37.2 C)     Temp Source 05/30/16 2045 Oral     SpO2 05/30/16 2045 95 %     Weight 05/30/16 2046 (!) 359 lb (162.8 kg)     Height 05/30/16 2046 5\' 1"  (1.549 m)     Head Circumference --      Peak Flow --      Pain Score 05/30/16 2045 10     Pain Loc --      Pain Edu? --      Excl. in GC? --     Constitutional: Alert and oriented. Apparent respiratory distress Eyes: Conjunctivae are normal.  Head: Atraumatic. Mouth/Throat: Mucous membranes are moist.  Oropharynx non-erythematous. Neck: No stridor.   Cardiovascular: Normal rate, regular rhythm. Good peripheral circulation. Grossly normal heart sounds. Respiratory: Tachypnea, positive accessory respiratory muscle use, bibasilar wheezing Gastrointestinal: Soft and nontender. No distention.  Musculoskeletal: No lower extremity tenderness nor edema. No gross deformities of extremities. Neurologic:  Normal speech and language. No gross focal neurologic deficits are appreciated.  Skin:  Skin is warm, dry and intact. No rash noted. Psychiatric: Mood and affect are normal. Speech and behavior are normal.  ____________________________________________   LABS (all labs ordered are listed, but only abnormal results are displayed)  Labs Reviewed  BASIC METABOLIC PANEL - Abnormal; Notable for the following:       Result Value   Potassium 3.3 (*)    Chloride 97 (*)    Glucose, Bld 101 (*)    All other components within normal limits  CBC - Abnormal;  Notable for the following:    Hemoglobin 10.3 (*)    HCT 32.6 (*)    MCV 72.1 (*)    MCH 22.8 (*)    MCHC 31.6 (*)    RDW 18.2 (*)    All other components within normal limits  HEMOGLOBIN A1C - Abnormal; Notable for the following:    Hgb A1c MFr Bld 6.1 (*)    All other components within normal limits  TROPONIN I  BRAIN NATRIURETIC PEPTIDE  TSH  TROPONIN I  TROPONIN I  TROPONIN I  HIV ANTIBODY (ROUTINE TESTING)   ____________________________________________  EKG  ED ECG REPORT I,  N BROWN, the attending physician, personally viewed and interpreted this ECG.   Date: 06/15/2016  EKG Time: 8:49 PM  Rate: 95  Rhythm: Normal sinus rhythm  Axis: Normal  Intervals: Normal  ST&T Change: None  ____________________________________________  RADIOLOGY I, Ray N BROWN, personally viewed and evaluated these images (plain radiographs) as part of my medical decision making, as well as reviewing the written report by the radiologist.  CLINICAL DATA:  Chest pain and shortness of breath since yesterday.  EXAM: CHEST  2 VIEW  COMPARISON:  05/11/2016.  FINDINGS: The cardiac silhouette remains borderline enlarged. The pulmonary vasculature remains mildly prominent with stable mild central peribronchial thickening. Moderate dextroconvex lumbar scoliosis and fixation rod and wires, unchanged.  IMPRESSION: No acute abnormality. Stable borderline cardiomegaly, mild pulmonary vascular congestion and mild bronchitic changes.   Electronically Signed   By: Beckie SaltsSteven  Reid M.D.   On: 05/30/2016 21:23  Procedures  Critical care:CRITICAL CARE Performed by: Darci CurrentANDOLPH N BROWN   Total critical care time: 30 minutes  Critical care time was exclusive of separately billable procedures and treating other patients.  Critical care was necessary to treat or prevent imminent or life-threatening deterioration.  Critical care was time spent personally by me on the following  activities: development of treatment plan with patient and/or surrogate as well as nursing, discussions with consultants, evaluation of patient's response to treatment, examination of patient, obtaining history from patient or surrogate, ordering and performing treatments and interventions, ordering and review of laboratory studies, ordering and review of radiographic studies, pulse oximetry and re-evaluation of patient's condition.  ____________________________________________   INITIAL IMPRESSION / ASSESSMENT AND PLAN / ED COURSE  Pertinent labs & imaging results that were available during my care of the patient were reviewed by me and considered in my medical decision making (see chart for details).  Patient given 2 DuoNeb's and 60 mg of IV Lasix with improvement of symptoms.      ____________________________________________  FINAL CLINICAL IMPRESSION(S) / ED DIAGNOSES  CHF exacerbation  MEDICATIONS GIVEN DURING THIS VISIT:  Medications  albuterol (PROVENTIL) (2.5 MG/3ML) 0.083% nebulizer solution 5 mg (5 mg Nebulization Given 05/30/16 2054)  ipratropium-albuterol (DUONEB) 0.5-2.5 (3) MG/3ML nebulizer solution 3 mL (3 mLs Nebulization Given 05/31/16 0144)  morphine 4 MG/ML injection 4 mg (4 mg Intravenous Given 05/31/16 0208)  furosemide (LASIX) injection 60 mg (60 mg Intravenous Given 05/31/16 0207)     NEW OUTPATIENT MEDICATIONS STARTED DURING THIS VISIT:  Discharge Medication List as of 06/01/2016 11:39 AM      Discharge Medication List as of 06/01/2016 11:39 AM      Discharge Medication List as of 06/01/2016 11:39 AM       Note:  This document was prepared using Dragon voice recognition software and may include unintentional dictation errors.    Darci CurrentBrown, [place] N, MD 06/15/16 [phone]

## 2016-06-17 ENCOUNTER — Ambulatory Visit: Payer: Self-pay

## 2016-06-18 ENCOUNTER — Ambulatory Visit: Payer: Self-pay

## 2016-06-18 ENCOUNTER — Ambulatory Visit: Payer: Self-pay | Admitting: Family

## 2016-06-18 ENCOUNTER — Telehealth: Payer: Self-pay

## 2016-06-18 NOTE — Telephone Encounter (Signed)
[name] did not show up for her nurse visit, today. I did reschedule this appointment from Monday as the patient called to inform us she could not make it due to her having a fall and being in pain.   I attempted to reach the patient to see if she was going to be able to come in today, but was unable to reach her, I did leave a message on her voicemail again informing her that it is important she come in today as she is supposed to have her labs rechecked.

## 2016-06-21 ENCOUNTER — Telehealth: Payer: Self-pay

## 2016-06-21 ENCOUNTER — Ambulatory Visit: Payer: Self-pay

## 2016-06-21 NOTE — Telephone Encounter (Signed)
Patient was scheduled for a nurse visit this morning to have labs drawn. This appointment has been rescheduled multiple times and the patient has missed another appointment today. I attempted to contact the patient in regards to this appointment and had to leave a voicemail.   Will continue to attempt to reschedule.

## 2016-07-04 ENCOUNTER — Emergency Department: Payer: 59

## 2016-07-04 ENCOUNTER — Emergency Department
Admission: EM | Admit: 2016-07-04 | Discharge: 2016-07-04 | Disposition: A | Discharge status: Home / Self Care | Payer: 59 | Attending: Emergency Medicine | Admitting: Emergency Medicine

## 2016-07-04 ENCOUNTER — Encounter: Payer: Self-pay | Admitting: *Deleted

## 2016-07-04 DIAGNOSIS — I5032 Chronic diastolic (congestive) heart failure: Secondary | ICD-10-CM | POA: Insufficient documentation

## 2016-07-04 DIAGNOSIS — R079 Chest pain, unspecified: Secondary | ICD-10-CM

## 2016-07-04 DIAGNOSIS — Z79899 Other long term (current) drug therapy: Secondary | ICD-10-CM | POA: Insufficient documentation

## 2016-07-04 DIAGNOSIS — Z87891 Personal history of nicotine dependence: Secondary | ICD-10-CM | POA: Insufficient documentation

## 2016-07-04 DIAGNOSIS — R42 Dizziness and giddiness: Secondary | ICD-10-CM | POA: Diagnosis not present

## 2016-07-04 DIAGNOSIS — M79604 Pain in right leg: Secondary | ICD-10-CM | POA: Diagnosis not present

## 2016-07-04 LAB — CBC
HEMATOCRIT: 31.6 % — AB (ref 35.0–47.0)
Hemoglobin: 10.1 g/dL — ABNORMAL LOW (ref 12.0–16.0)
MCH: 22.7 pg — ABNORMAL LOW (ref 26.0–34.0)
MCHC: 31.9 g/dL — ABNORMAL LOW (ref 32.0–36.0)
MCV: 71 fL — ABNORMAL LOW (ref 80.0–100.0)
PLATELETS: 311 10*3/uL (ref 150–440)
RBC: 4.44 MIL/uL (ref 3.80–5.20)
RDW: 17.9 % — AB (ref 11.5–14.5)
WBC: 10.4 10*3/uL (ref 3.6–11.0)

## 2016-07-04 LAB — BRAIN NATRIURETIC PEPTIDE: B NATRIURETIC PEPTIDE 5: 14 pg/mL (ref 0.0–100.0)

## 2016-07-04 LAB — BASIC METABOLIC PANEL
Anion gap: 9 (ref 5–15)
BUN: 11 mg/dL (ref 6–20)
CO2: 31 mmol/L (ref 22–32)
CREATININE: 0.82 mg/dL (ref 0.44–1.00)
Calcium: 8.8 mg/dL — ABNORMAL LOW (ref 8.9–10.3)
Chloride: 96 mmol/L — ABNORMAL LOW (ref 101–111)
GFR calc Af Amer: >60 mL/min (ref >60)
Glucose, Bld: 98 mg/dL (ref 65–99)
POTASSIUM: 3.5 mmol/L (ref 3.5–5.1)
SODIUM: 136 mmol/L (ref 135–145)

## 2016-07-04 LAB — PREGNANCY, URINE: PREG TEST UR: NEGATIVE

## 2016-07-04 LAB — TROPONIN I: Troponin I: <0.03 ng/mL (ref <0.03)

## 2016-07-04 IMAGING — CR DG CHEST 2V
1 series · 2 of 2 positions shown · non-contrast
Comparison: [DATE]

CLINICAL DATA: Right sided chest pains beginning yesterday. Pt
reports having SOB and nausea with dizziness that has worsened
today. Pt is currently being treated for CHF and reports when she
felt as though she was gaining weight and SOB

EXAM:
CHEST  2 VIEW

[Series 1: dg chest 2 view · 0.14mm/px · 2 of 2 slices shown]
[im 1/2]
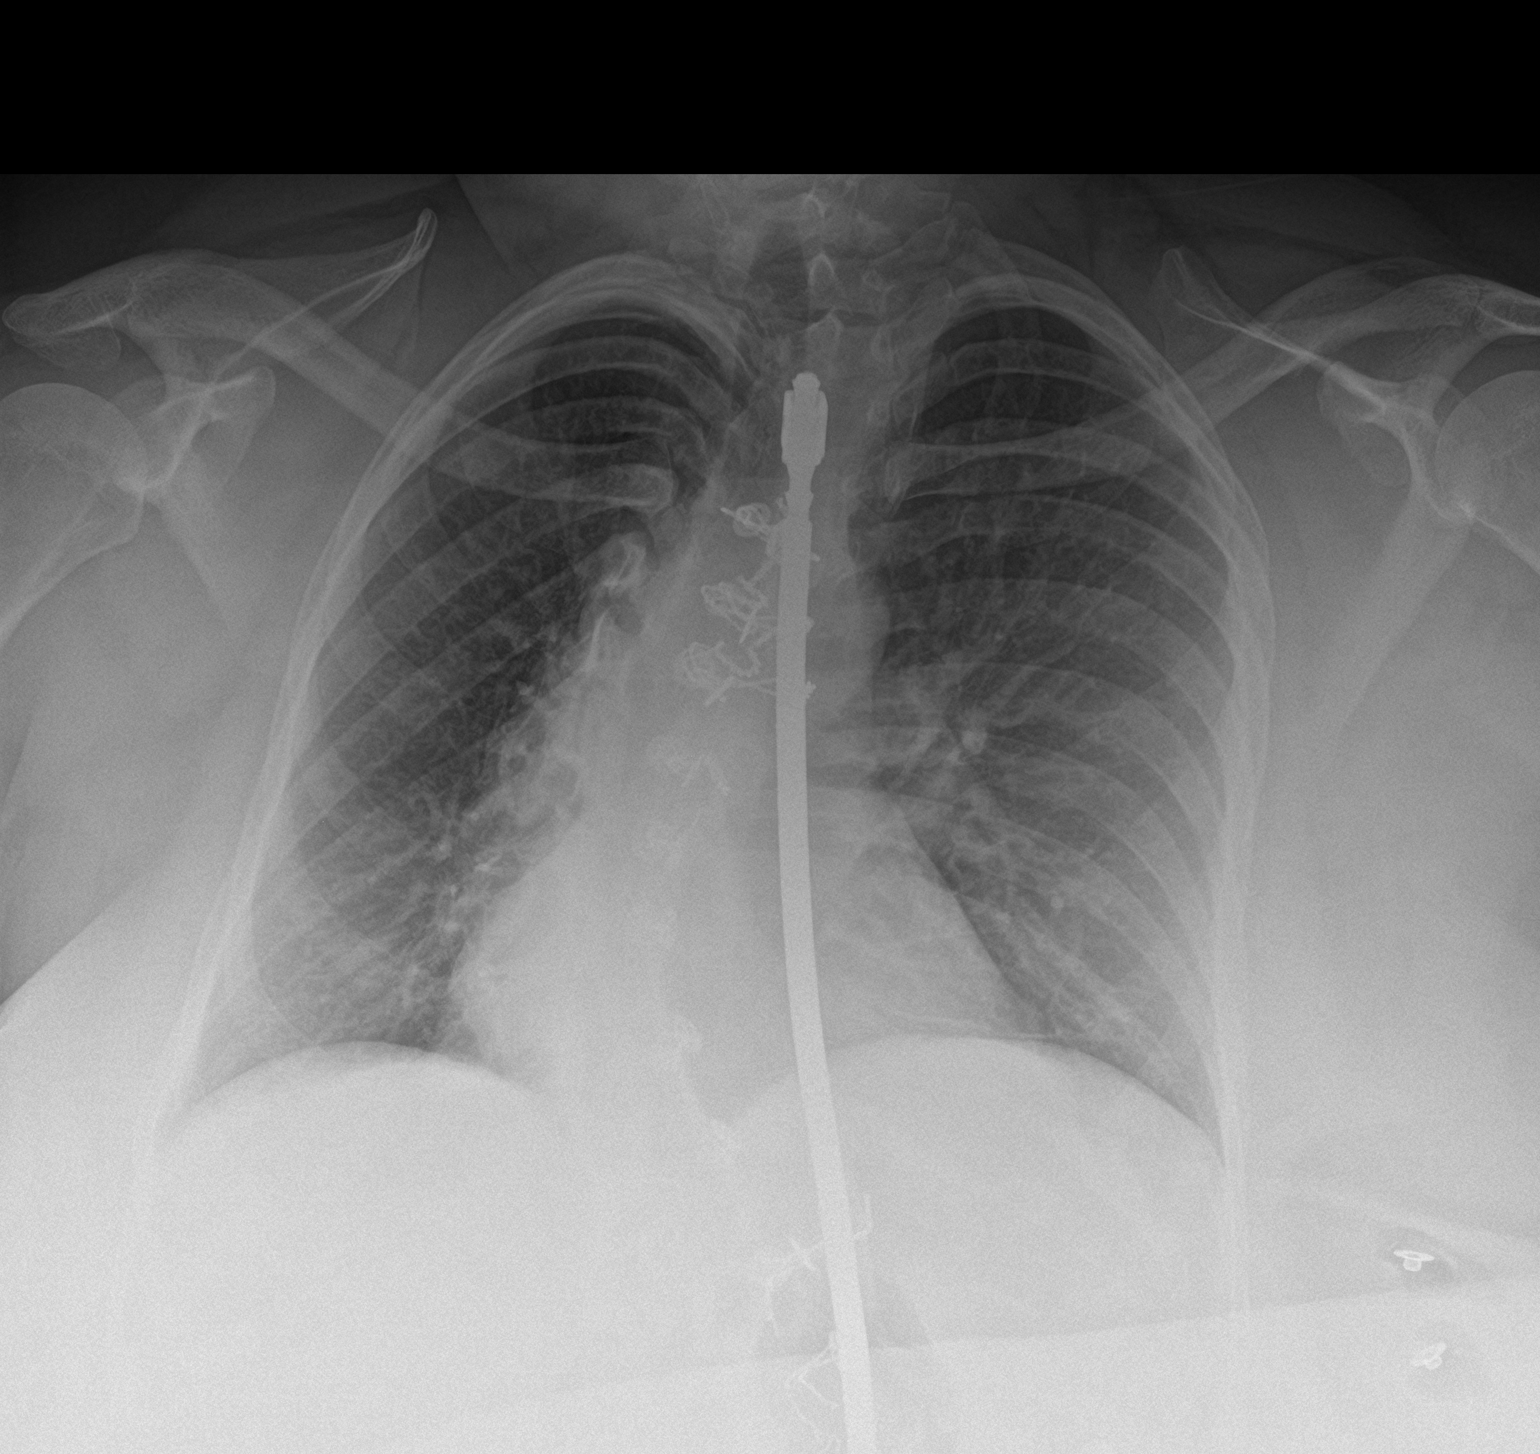
[im 2/2]
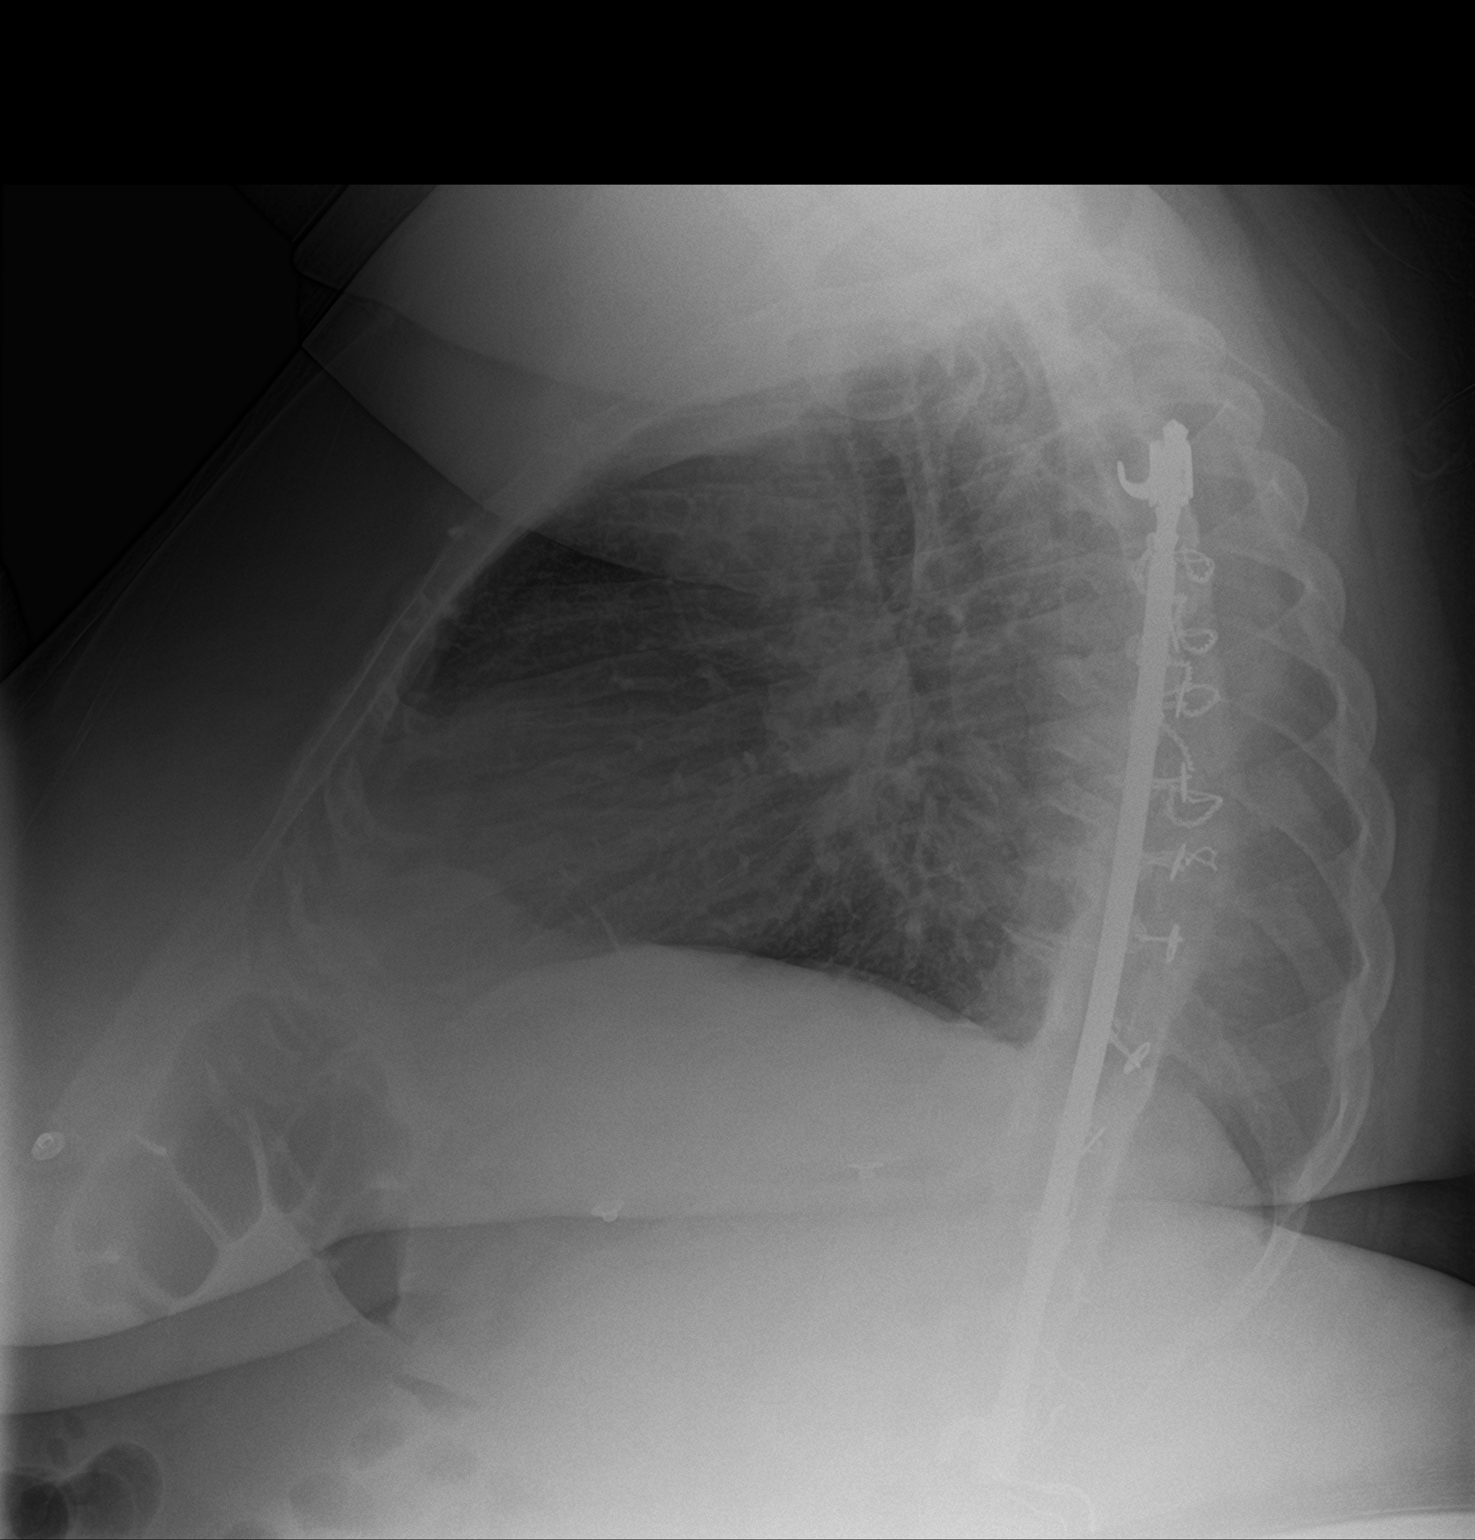

[2 of 2 positions shown; findings below may reference images not displayed]

FINDINGS: Status post median sternotomy. Status post thoracolumbar fusion. The
heart is normal in size. The lungs are clear. No pulmonary edema.
IMPRESSION: No active cardiopulmonary disease.

## 2016-07-04 MED ORDER — SODIUM CHLORIDE 0.9 % IV BOLUS (SEPSIS)
500.0000 mL | Freq: Once | INTRAVENOUS | Status: AC
  Administered 2016-07-04: 500 mL via INTRAVENOUS

## 2016-07-04 MED ORDER — ACETAMINOPHEN 500 MG PO TABS
1000.0000 mg | ORAL_TABLET | Freq: Once | ORAL | Status: AC
  Administered 2016-07-04: 1000 mg via ORAL
  Filled 2016-07-04: qty 2

## 2016-07-04 NOTE — ED Provider Notes (Signed)
[facility] Emergency Department Provider Note  ____________________________________________  Time seen: Approximately 7:54 PM  I have reviewed the triage vital signs and the nursing notes.   HISTORY  Chief Complaint Chest Pain   HPI Tracey White is a 36 y.o. female with a history of diastolic CHF, OSA on CPAP, peripheral edema, morbidly obesity who presents for evaluation of chest pain. Patient reports that yesterday she felt like she was retaining fluid. She decided to take 80 mg of by mouth Lasix and 20 mg of torsemide. She reports 10 pound weight loss over the last 24 hours since taking those medications. Today she started feeling dizzy like she was going to pass out. She is also complaining of right-sided chest pain that is mild, pleuritic, associated with a cough productive of clear sputum and intermittent wheezing which she has had for the last few days. No SOB, fever, chills. Patient also complaining of right leg pain that started yesterday evening that she describes as a sharp pain located mostly on her thigh constant and nonradiating. Patient denies personal or family history of blood clots, recent travel or immobilization, exogenous hormones, hemoptysis.She smokes, no h/o COPD or asthma.  Past Medical History:  Diagnosis Date  . Anxiety   . Chronic heart failure (HCC)   . Morbid obesity (HCC)   . OSA (obstructive sleep apnea)    uses CPAP at home  . Peripheral edema   . Scoliosis     Patient Active Problem List   Diagnosis Date Noted  . Hypokalemia 06/06/2016  . Chest pain 05/31/2016  . Chronic diastolic heart failure (HCC) 05/15/2016  . Tachycardia 05/15/2016  . Obstructive sleep apnea on CPAP 05/15/2016  . Tobacco use 05/15/2016  . Chest pain at rest 10/18/2015    Past Surgical History:  Procedure Laterality Date  . HERNIA REPAIR    . scoliosis repair    . TUBAL LIGATION      Prior to Admission medications   Medication Sig Start  Date End Date Taking? Authorizing Provider  albuterol (PROVENTIL HFA;VENTOLIN HFA) 108 (90 Base) MCG/ACT inhaler Inhale 2-4 puffs by mouth every 4 hours as needed for wheezing, cough, and/or shortness of breath 05/12/16   Loleta RoseForbach, Cory, MD  ALPRAZolam Prudy Feeler(XANAX) 0.5 MG tablet Take 1 tablet by mouth daily as needed for anxiety.  11/14/15   [provider]  beclomethasone (QVAR) 40 MCG/ACT inhaler Inhale 1 puff into the lungs 2 (two) times daily. 10/20/15   Alford HighlandWieting, Richard, MD  Elastic Bandages & Supports (T.E.D. BELOW KNEE/LARGE) MISC 2 application by Does not apply route daily. 12/25/15   Willy Eddyobinson, Patrick, MD  escitalopram (LEXAPRO) 20 MG tablet Take 20 mg by mouth daily.     [provider]  gabapentin (NEURONTIN) 100 MG capsule Take 100-200 mg by mouth 3 (three) times daily.    [provider]  lidocaine (LIDODERM) 5 % Place 1 patch onto the skin daily. Remove & Discard patch within 12 hours or as directed by MD    [provider]  losartan (COZAAR) 25 MG tablet Take 25 mg by mouth daily.    [provider]  meloxicam (MOBIC) 15 MG tablet Take 15 mg by mouth daily.    [provider]  metolazone (ZAROXOLYN) 5 MG tablet Take 1 tablet (5 mg total) by mouth daily. 05/30/16 06/02/16  Delma FreezeHackney, Tina A, FNP  metoprolol succinate (TOPROL-XL) 25 MG 24 hr tablet Take 1 tablet by mouth daily. 10/30/15   [provider]  montelukast (SINGULAIR) 10 MG tablet Take 10 mg by mouth at bedtime.    [provider]  potassium chloride SA (K-DUR,KLOR-CON) 20 MEQ tablet Take 1 tablet (20 mEq total) by mouth 2 (two) times daily. 05/15/16 07/14/16  Delma Freeze, FNP  torsemide (DEMADEX) 20 MG tablet Take 2 tablets (40 mg total) by mouth 2 (two) times daily. 05/15/16   Delma Freeze, FNP    Allergies Aspirin  Family History  Problem Relation Age of Onset  . Diabetes Mother   . Hypertension Mother     Social History Social History  Substance Use  Topics  . Smoking status: Former Smoker    Packs/day: 0.50    Types: Cigarettes    Quit date: 03/27/2016  . Smokeless tobacco: Never Used  . Alcohol use Yes     Comment: occasionally    Review of Systems  Constitutional: Negative for fever. + Lightheadedness Eyes: Negative for visual changes. ENT: Negative for sore throat. Neck: No neck pain  Cardiovascular: + R sided chest pain. Respiratory: Negative for shortness of breath. + cough Gastrointestinal: Negative for abdominal pain, vomiting or diarrhea. Genitourinary: Negative for dysuria. Musculoskeletal: Negative for back pain. + RLE pain Skin: Negative for rash. Neurological: Negative for headaches, weakness or numbness. Psych: No SI or HI  ____________________________________________   PHYSICAL EXAM:  VITAL SIGNS: ED Triage Vitals [07/04/16 1754]  Enc Vitals Group     BP 109/64     Pulse Rate 88     Resp 18     Temp 98.4 F (36.9 C)     Temp Source Oral     SpO2 94 %     Weight (!) 358 lb (162.4 kg)     Height 5\' 1"  (1.549 m)     Head Circumference      Peak Flow      Pain Score 10     Pain Loc      Pain Edu?      Excl. in GC?     Constitutional: Alert and oriented. Well appearing and in no apparent distress. HEENT:      Head: Normocephalic and atraumatic.         Eyes: Conjunctivae are normal. Sclera is non-icteric.       Mouth/Throat: Mucous membranes are moist.       Neck: Supple with no signs of meningismus. Cardiovascular: Regular rate and rhythm. No murmurs, gallops, or rubs. 2+ symmetrical distal pulses are present in all extremities. No JVD. Respiratory: Normal respiratory effort. Lungs are clear to auscultation bilaterally. No wheezes, crackles, or rhonchi.  Gastrointestinal: Obese, non tender, and non distended with positive bowel sounds. No rebound or guarding. Musculoskeletal: No pitting edema, no tenderness to palpation of the right hip. Neurologic: Normal speech and language. Face is  symmetric. Moving all extremities. No gross focal neurologic deficits are appreciated. Skin: Skin is warm, dry and intact. No rash noted. Psychiatric: Mood and affect are normal. Speech and behavior are normal.  ____________________________________________   LABS (all labs ordered are listed, but only abnormal results are displayed)  Labs Reviewed  BASIC METABOLIC PANEL - Abnormal; Notable for the following:       Result Value   Chloride 96 (*)    Calcium 8.8 (*)    All other components within normal limits  CBC - Abnormal; Notable for the following:    Hemoglobin 10.1 (*)    HCT 31.6 (*)    MCV 71.0 (*)  MCH 22.7 (*)    MCHC 31.9 (*)    RDW 17.9 (*)    All other components within normal limits  TROPONIN I  BRAIN NATRIURETIC PEPTIDE  PREGNANCY, URINE  TROPONIN I   ____________________________________________  EKG  ED ECG REPORT I, Nita Sickle, the attending physician, personally viewed and interpreted this ECG.  Normal sinus rhythm, rate of 83, normal intervals, normal axis, no ST elevations or depressions, T-wave inversion in aVL, T-wave flattening in 1. Unchanged from prior from 06/02/2016 ____________________________________________  RADIOLOGY  CXR: Negative   Doppler RLE: Negative for DVT  ____________________________________________   PROCEDURES  Procedure(s) performed: None Procedures Critical Care performed:  None ____________________________________________   INITIAL IMPRESSION / ASSESSMENT AND PLAN / ED COURSE  36 y.o. female with a history of diastolic CHF, OSA on CPAP, peripheral edema, morbidly obesity who presents for evaluation of chest pain, dizziness, RLE pain.  # dizziness: Patient took extremely large amount of diuretics yesterday with 10lb weight loss in 24 hours. No evidence of volume overload on exam with no crackles, normal JVD, and no pitting edema. BNP is within normal limits, chest x-ray is negative. We'll give 500 cc of  fluids since patient has been over diuresed. Luckily for her her electrolytes and creatinine are normal.  # RLE pain: Will get doppler to rule DVT although low suspicion  # CP reproducible on exam in the setting of cough. PERC negative. CXR normal. EKG normal. WIll get two sets of troponin.    _________________________ 10:14 PM on 07/04/2016 -----------------------------------------  Patient a longer dizzy after receiving 500 cc. Doppler of her right lower extremity no evidence of DVT. This is most likely sciatica pain discussed weight loss with the patient. Troponin 2 is negative. Patient was given a be discharged with close follow-up at CHF clinic tomorrow. Recommended if she does not take more medications than prescribed. Recommend return to the emergency room she has no worsening chest pain, shortness of breath, or any other symptoms that were not present during this visit.  Pertinent labs & imaging results that were available during my care of the patient were reviewed by me and considered in my medical decision making (see chart for details).    ____________________________________________   FINAL CLINICAL IMPRESSION(S) / ED DIAGNOSES  Final diagnoses:  Chest pain, unspecified type  Right leg pain  Dizziness      NEW MEDICATIONS STARTED DURING THIS VISIT:  New Prescriptions   No medications on file     Note:  This document was prepared using Dragon voice recognition software and may include unintentional dictation errors.    Nita Sickle, MD 07/04/16 2215

## 2016-07-04 NOTE — Discharge Instructions (Signed)
Follow-up with her cardiologist in 24 hours for reevaluation. Take her medications as prescribed. Return to the emergency room if you have new or worsening chest pain, shortness of breath, or any other symptoms concerning to you.

## 2016-07-04 NOTE — ED Triage Notes (Signed)
Pt arrived to ED via EMS reporting right sided chest pains beginning yesterday. Pt reports having SOB and nausea with dizziness that has worsened today. Pt is currently being treated for CHF and reports when she felt as though she was gaining weight and SOB she took 80 mg laxis. PT reports she woke today with chest pains worse and new onset of right leg pains. Pt is hypotensive in triage and reports having lost 15 pounds of fluid last night after taking medications.

## 2016-07-08 ENCOUNTER — Ambulatory Visit: Payer: Self-pay | Admitting: Family

## 2016-07-08 ENCOUNTER — Telehealth: Payer: Self-pay | Admitting: Family

## 2016-07-08 NOTE — Telephone Encounter (Signed)
Patient did not show for her [facility] Failure [facility] appointment on 07/08/16. Will attempt to reschedule.  

## 2016-07-17 ENCOUNTER — Emergency Department: Payer: 59

## 2016-07-17 ENCOUNTER — Encounter: Payer: Self-pay | Admitting: *Deleted

## 2016-07-17 ENCOUNTER — Emergency Department
Admission: EM | Admit: 2016-07-17 | Discharge: 2016-07-18 | Disposition: A | Discharge status: Home / Self Care | Payer: 59 | Attending: Emergency Medicine | Admitting: Emergency Medicine

## 2016-07-17 DIAGNOSIS — Z79899 Other long term (current) drug therapy: Secondary | ICD-10-CM | POA: Insufficient documentation

## 2016-07-17 DIAGNOSIS — M5431 Sciatica, right side: Secondary | ICD-10-CM

## 2016-07-17 DIAGNOSIS — Z87891 Personal history of nicotine dependence: Secondary | ICD-10-CM | POA: Insufficient documentation

## 2016-07-17 DIAGNOSIS — R609 Edema, unspecified: Secondary | ICD-10-CM

## 2016-07-17 DIAGNOSIS — I5032 Chronic diastolic (congestive) heart failure: Secondary | ICD-10-CM | POA: Insufficient documentation

## 2016-07-17 LAB — BASIC METABOLIC PANEL
ANION GAP: 5 (ref 5–15)
BUN: 11 mg/dL (ref 6–20)
CALCIUM: 8.7 mg/dL — AB (ref 8.9–10.3)
CO2: 30 mmol/L (ref 22–32)
CREATININE: 0.75 mg/dL (ref 0.44–1.00)
Chloride: 103 mmol/L (ref 101–111)
GFR calc Af Amer: >60 mL/min (ref >60)
GLUCOSE: 95 mg/dL (ref 65–99)
Potassium: 3.8 mmol/L (ref 3.5–5.1)
Sodium: 138 mmol/L (ref 135–145)

## 2016-07-17 LAB — CBC
HCT: 31.5 % — ABNORMAL LOW (ref 35.0–47.0)
HEMOGLOBIN: 9.9 g/dL — AB (ref 12.0–16.0)
MCH: 22.9 pg — ABNORMAL LOW (ref 26.0–34.0)
MCHC: 31.6 g/dL — AB (ref 32.0–36.0)
MCV: 72.4 fL — ABNORMAL LOW (ref 80.0–100.0)
PLATELETS: 281 10*3/uL (ref 150–440)
RBC: 4.35 MIL/uL (ref 3.80–5.20)
RDW: 18.4 % — AB (ref 11.5–14.5)
WBC: 9.9 10*3/uL (ref 3.6–11.0)

## 2016-07-17 LAB — BRAIN NATRIURETIC PEPTIDE: B NATRIURETIC PEPTIDE 5: 23 pg/mL (ref 0.0–100.0)

## 2016-07-17 LAB — TROPONIN I

## 2016-07-17 IMAGING — CR DG CHEST 2V
1 series · 2 of 2 positions shown · non-contrast
Comparison: Chest radiograph dated [DATE]

CLINICAL DATA: 35-year-old female with chest pain and shortness of
breath.

EXAM:
CHEST  2 VIEW

[Series 1: dg chest 2 view · 0.14mm/px · 2 of 2 slices shown]
[im 1/2]
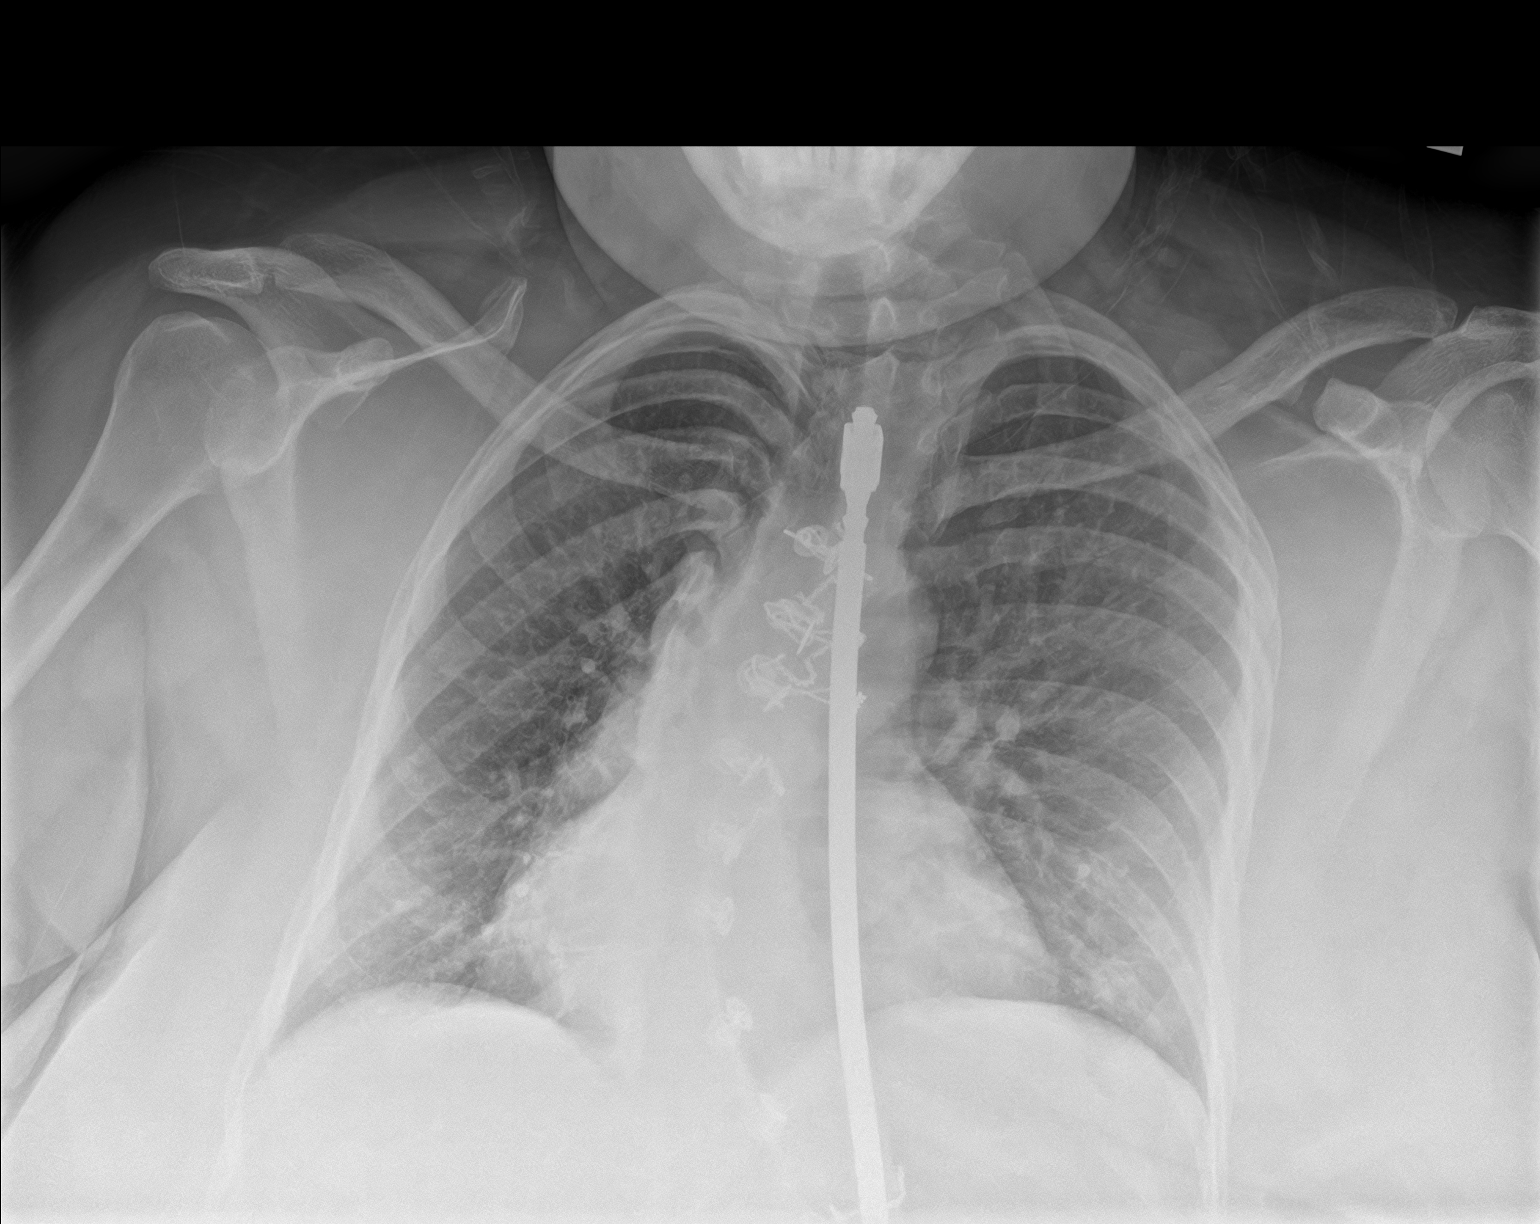
[im 2/2]
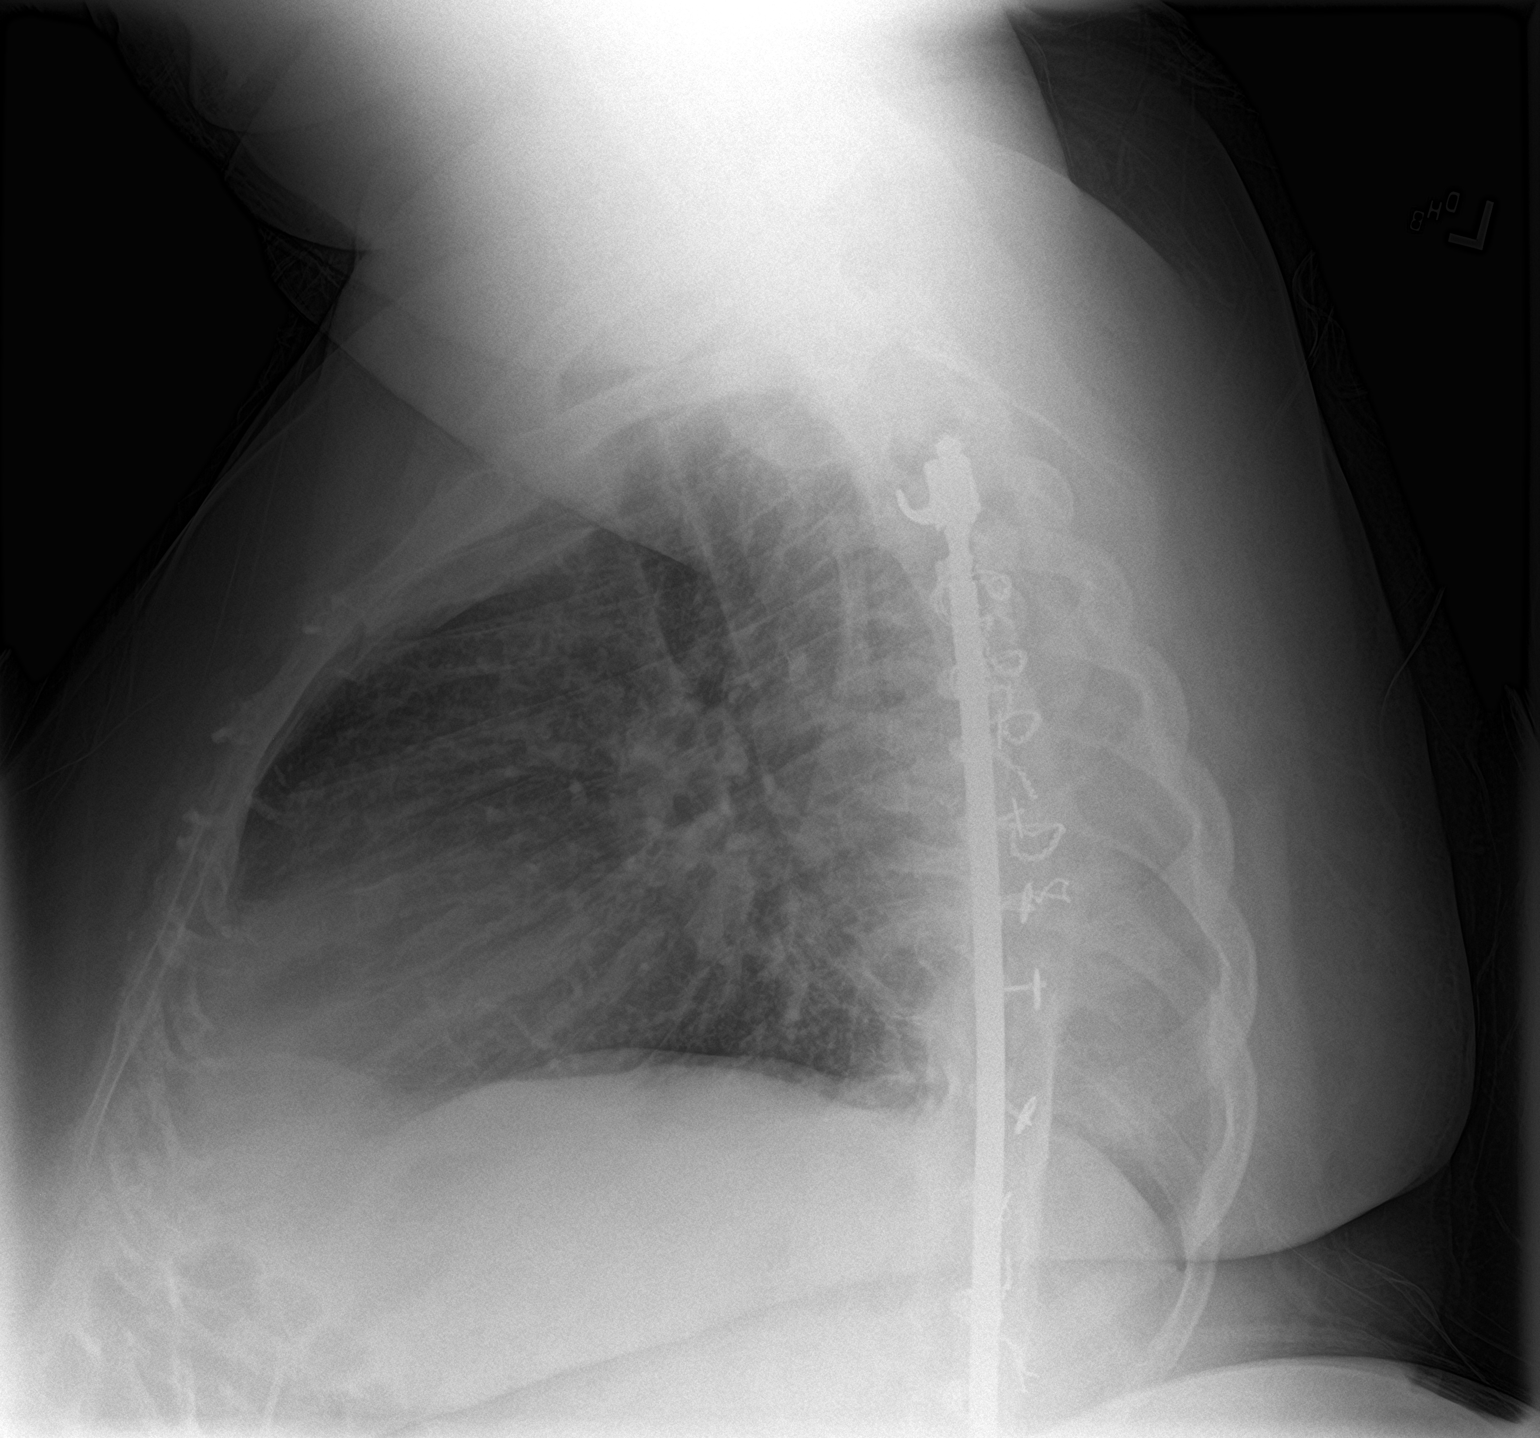

[2 of 2 positions shown; findings below may reference images not displayed]

FINDINGS: There is mild pulmonary vascular prominence. No focal consolidation,
pleural effusion, or pneumothorax. Stable top-normal cardiac size.
There is moderate dextroscoliosis of the spine with fixation rod and
cerclage wires. No acute osseous pathology.
IMPRESSION: Top-normal cardiac size with mild vascular congestion. No focal
consolidation.

## 2016-07-17 NOTE — ED Triage Notes (Signed)
Pt reports chest pain and sob for 1 week.  Pt states she is taking fluid pills but not helping.  Pt has swelling to right lower leg.  Pt alert.  Speech clear.

## 2016-07-18 ENCOUNTER — Emergency Department: Payer: 59

## 2016-07-18 MED ORDER — FUROSEMIDE 10 MG/ML IJ SOLN
40.0000 mg | Freq: Once | INTRAMUSCULAR | Status: AC
  Administered 2016-07-18: 40 mg via INTRAVENOUS
  Filled 2016-07-18: qty 4

## 2016-07-18 MED ORDER — KETOROLAC TROMETHAMINE 30 MG/ML IJ SOLN
15.0000 mg | INTRAMUSCULAR | Status: AC
  Administered 2016-07-18: 15 mg via INTRAVENOUS
  Filled 2016-07-18: qty 1

## 2016-07-18 NOTE — ED Notes (Signed)

## 2016-07-18 NOTE — Discharge Instructions (Signed)
Results for orders placed or performed during the hospital encounter of 07/17/16  Basic metabolic panel  Result Value Ref Range   Sodium 138 135 - 145 mmol/L   Potassium 3.8 3.5 - 5.1 mmol/L   Chloride 103 101 - 111 mmol/L   CO2 30 22 - 32 mmol/L   Glucose, Bld 95 65 - 99 mg/dL   BUN 11 6 - 20 mg/dL   Creatinine, Ser 1.610.75 0.44 - 1.00 mg/dL   Calcium 8.7 (L) 8.9 - 10.3 mg/dL   GFR calc non Af Amer >60 >60 mL/min   GFR calc Af Amer >60 >60 mL/min   Anion gap 5 5 - 15  CBC  Result Value Ref Range   WBC 9.9 3.6 - 11.0 K/uL   RBC 4.35 3.80 - 5.20 MIL/uL   Hemoglobin 9.9 (L) 12.0 - 16.0 g/dL   HCT 09.631.5 (L) 04.535.0 - 40.947.0 %   MCV 72.4 (L) 80.0 - 100.0 fL   MCH 22.9 (L) 26.0 - 34.0 pg   MCHC 31.6 (L) 32.0 - 36.0 g/dL   RDW 81.118.4 (H) 91.411.5 - 78.214.5 %   Platelets 281 150 - 440 K/uL  Troponin I  Result Value Ref Range   Troponin I <0.03 <0.03 ng/mL  Brain natriuretic peptide  Result Value Ref Range   B Natriuretic Peptide 23.0 0.0 - 100.0 pg/mL   Dg Chest 2 View  Result Date: 07/17/2016 CLINICAL DATA:  36 year old female with chest pain and shortness of breath. EXAM: CHEST  2 VIEW COMPARISON:  Chest radiograph dated 07/04/2016 FINDINGS: There is mild pulmonary vascular prominence. No focal consolidation, pleural effusion, or pneumothorax. Stable top-normal cardiac size. There is moderate dextroscoliosis of the spine with fixation rod and cerclage wires. No acute osseous pathology. IMPRESSION: Top-normal cardiac size with mild vascular congestion. No focal consolidation. Electronically Signed   By: Elgie CollardArash  Radparvar M.D.   On: 07/17/2016 22:01   Dg Chest 2 View  Result Date: 07/04/2016 CLINICAL DATA:  Right sided chest pains beginning yesterday. Pt reports having SOB and nausea with dizziness that has worsened today. Pt is currently being treated for CHF and reports when she felt as though she was gaining weight and SOB EXAM: CHEST  2 VIEW COMPARISON:  05/30/2016 FINDINGS: Status post median  sternotomy. Status post thoracolumbar fusion. The heart is normal in size. The lungs are clear. No pulmonary edema. IMPRESSION: No active cardiopulmonary disease. Electronically Signed   By: Norva PavlovElizabeth  Brown M.D.   On: 07/04/2016 18:21   [place] Venous Img Lower Unilateral Right  Result Date: 07/18/2016 CLINICAL DATA:  Initial evaluation for acute right lower extremity edema, pain. EXAM: Right LOWER EXTREMITY VENOUS DOPPLER ULTRASOUND TECHNIQUE: Gray-scale sonography with graded compression, as well as color Doppler and duplex ultrasound were performed to evaluate the lower extremity deep venous systems from the level of the common femoral vein and including the common femoral, femoral, profunda femoral, popliteal and calf veins including the posterior tibial, peroneal and gastrocnemius veins when visible. The superficial great saphenous vein was also interrogated. Spectral Doppler was utilized to evaluate flow at rest and with distal augmentation maneuvers in the common femoral, femoral and popliteal veins. COMPARISON:  Prior ultrasound from 07/04/2016. FINDINGS: Contralateral Common Femoral Vein: Respiratory phasicity is normal and symmetric with the symptomatic side. No evidence of thrombus. Normal compressibility. Common Femoral Vein: No evidence of thrombus. Normal compressibility, respiratory phasicity and response to augmentation. Saphenofemoral Junction: No evidence of thrombus. Normal compressibility and flow on color Doppler imaging. Profunda  Femoral Vein: No evidence of thrombus. Normal compressibility and flow on color Doppler imaging. Femoral Vein: No evidence of thrombus. Normal compressibility, respiratory phasicity and response to augmentation. Popliteal Vein: No evidence of thrombus. Normal compressibility, respiratory phasicity and response to augmentation. Calf Veins: No evidence of thrombus. Normal compressibility and flow on color Doppler imaging. Peroneal vein not visualized. Superficial Great  Saphenous Vein: No evidence of thrombus. Normal compressibility and flow on color Doppler imaging. Venous Reflux:  None. Other Findings:  None. IMPRESSION: No evidence of DVT within the right lower extremity. Electronically Signed   By: Rise Mu M.D.   On: 07/18/2016 03:05   [place] Venous Img Lower Unilateral Right  Result Date: 07/04/2016 CLINICAL DATA:  Right lower extremity pain and edema EXAM: Right LOWER EXTREMITY VENOUS DOPPLER ULTRASOUND TECHNIQUE: Gray-scale sonography with graded compression, as well as color Doppler and duplex ultrasound were performed to evaluate the lower extremity deep venous systems from the level of the common femoral vein and including the common femoral, femoral, profunda femoral, popliteal and calf veins including the posterior tibial, peroneal and gastrocnemius veins when visible. The superficial great saphenous vein was also interrogated. Spectral Doppler was utilized to evaluate flow at rest and with distal augmentation maneuvers in the common femoral, femoral and popliteal veins. COMPARISON:  12/25/2015 FINDINGS: Contralateral Common Femoral Vein: Respiratory phasicity is normal and symmetric with the symptomatic side. No evidence of thrombus. Normal compressibility. Common Femoral Vein: No evidence of thrombus. Normal compressibility, respiratory phasicity and response to augmentation. Saphenofemoral Junction: No evidence of thrombus. Normal compressibility and flow on color Doppler imaging. Profunda Femoral Vein: No evidence of thrombus. Normal compressibility and flow on color Doppler imaging. Femoral Vein: No evidence of thrombus. Normal compressibility, respiratory phasicity and response to augmentation. Popliteal Vein: No evidence of thrombus. Normal compressibility, respiratory phasicity and response to augmentation. Calf Veins: No evidence of thrombus. Normal compressibility and flow on color Doppler imaging. Other Findings:  None. IMPRESSION: No evidence of  DVT within the right lower extremity. Electronically Signed   By: Jasmine Pang M.D.   On: 07/04/2016 22:07

## 2016-07-18 NOTE — ED Notes (Signed)
Pt given ice, pt resting and is in NAD at this time.

## 2016-07-18 NOTE — ED Notes (Signed)
Patient transported to Ultrasound 

## 2016-07-18 NOTE — ED Provider Notes (Signed)
[facility] Emergency Department Provider Note  ____________________________________________  Time seen: Approximately 12:42 AM  I have reviewed the triage vital signs and the nursing notes.   HISTORY  Chief Complaint Chest Pain    HPI Tracey White is a 35 y.o. female who complains of right lower back pain radiating down the right leg on the back. This is been on it since yesterday when she was standing for prolonged. At time doing dishes. She does have a history of scoliosis.  Also reports that she had been on diuretics but that also not be working recently and she's had decreased urine output. Reports some occasional long-standing symptoms of shortness of breath but no orthopnea. Sometimes she has dull chest pain with deep breathing as well. She reports being worked up for blood clots in the past and seeing cardiology and having lots of tests without any concerning findings.  She also notes that she usually has bilateral lower extremity swelling. She also feels like her abdomen is swollen now. Since yesterday her right leg also is more swollen than the left which is unusual for her.  Review of the electronic medical record shows that she had an ultrasound of the right leg 2 weeks ago for asymmetric swelling and concern for DVT.     Past Medical History:  Diagnosis Date  . Anxiety   . Chronic heart failure (HCC)   . Morbid obesity (HCC)   . OSA (obstructive sleep apnea)    uses CPAP at home  . Peripheral edema   . Scoliosis      Patient Active Problem List   Diagnosis Date Noted  . Hypokalemia 06/06/2016  . Chest pain 05/31/2016  . Chronic diastolic heart failure (HCC) 05/15/2016  . Tachycardia 05/15/2016  . Obstructive sleep apnea on CPAP 05/15/2016  . Tobacco use 05/15/2016  . Chest pain at rest 10/18/2015     Past Surgical History:  Procedure Laterality Date  . HERNIA REPAIR    . scoliosis repair    . TUBAL LIGATION       Prior to  Admission medications   Medication Sig Start Date End Date Taking? Authorizing Provider  albuterol (PROVENTIL HFA;VENTOLIN HFA) 108 (90 Base) MCG/ACT inhaler Inhale 2-4 puffs by mouth every 4 hours as needed for wheezing, cough, and/or shortness of breath 05/12/16   Loleta Dasiah, MD  ALPRAZolam Prudy Feeler) 0.5 MG tablet Take 1 tablet by mouth daily as needed for anxiety.  11/14/15   [provider]  beclomethasone (QVAR) 40 MCG/ACT inhaler Inhale 1 puff into the lungs 2 (two) times daily. 10/20/15   Alford Highland, MD  Elastic Bandages & Supports (T.E.D. BELOW KNEE/LARGE) MISC 2 application by Does not apply route daily. 12/25/15   Willy Eddy, MD  escitalopram (LEXAPRO) 20 MG tablet Take 20 mg by mouth daily.     [provider]  gabapentin (NEURONTIN) 100 MG capsule Take 100-200 mg by mouth 3 (three) times daily.    [provider]  lidocaine (LIDODERM) 5 % Place 1 patch onto the skin daily. Remove & Discard patch within 12 hours or as directed by MD    [provider]  losartan (COZAAR) 25 MG tablet Take 25 mg by mouth daily.    [provider]  meloxicam (MOBIC) 15 MG tablet Take 15 mg by mouth daily.    [provider]  metolazone (ZAROXOLYN) 5 MG tablet Take 1 tablet (5 mg total) by mouth daily. 05/30/16 06/02/16  Delma Freeze,  FNP  metoprolol succinate (TOPROL-XL) 25 MG 24 hr tablet Take 1 tablet by mouth daily. 10/30/15   [provider]  montelukast (SINGULAIR) 10 MG tablet Take 10 mg by mouth at bedtime.    [provider]  potassium chloride SA (K-DUR,KLOR-CON) 20 MEQ tablet Take 1 tablet (20 mEq total) by mouth 2 (two) times daily. 05/15/16 07/14/16  Delma Freeze, FNP  torsemide (DEMADEX) 20 MG tablet Take 2 tablets (40 mg total) by mouth 2 (two) times daily. 05/15/16   Delma Freeze, FNP     Allergies Aspirin   Family History  Problem Relation Age of Onset  . Diabetes Mother   . Hypertension Mother      Social History Social History  Substance Use Topics  . Smoking status: Former Smoker    Packs/day: 0.50    Types: Cigarettes    Quit date: 03/27/2016  . Smokeless tobacco: Never Used  . Alcohol use Yes     Comment: occasionally    Review of Systems  Constitutional:   No fever or chills.  ENT:   No sore throat. No rhinorrhea. Cardiovascular:   Positive occasional dull chest pain as above.Marland Kitchen Respiratory:   Positive occasional shortness of breath, none presently Gastrointestinal:   Negative for abdominal pain, vomiting and diarrhea.  Musculoskeletal:   Positive right lower back pain and bilateral lower extremity swelling, right greater than left. All other systems reviewed and are negative except as documented above in ROS and HPI.  ____________________________________________   PHYSICAL EXAM:  VITAL SIGNS: ED Triage Vitals  Enc Vitals Group     BP 07/17/16 2131 (!) 101/55     Pulse Rate 07/17/16 2131 73     Resp 07/17/16 2131 20     Temp 07/17/16 2131 98.1 F (36.7 C)     Temp Source 07/17/16 2131 Oral     SpO2 07/17/16 2131 100 %     Weight 07/17/16 2131 (!) 365 lb (165.6 kg)     Height 07/17/16 2131 5\' 1"  (1.549 m)     Head Circumference --      Peak Flow --      Pain Score 07/17/16 2130 10     Pain Loc --      Pain Edu? --      Excl. in GC? --     Vital signs reviewed, nursing assessments reviewed.   Constitutional:   Alert and oriented. Well appearing and in no distress. Eyes:   No scleral icterus.  EOMI. No nystagmus. No conjunctival pallor. PERRL. ENT   Head:   Normocephalic and atraumatic.   Nose:   No congestion/rhinnorhea.    Mouth/Throat:   MMM, no pharyngeal erythema. No peritonsillar mass.    Neck:   No meningismus. Full ROM Hematological/Lymphatic/Immunilogical:   No cervical lymphadenopathy. Cardiovascular:   RRR. Symmetric bilateral radial and DP pulses.  No murmurs.  Respiratory:   Normal respiratory effort without  tachypnea/retractions. Breath sounds are clear and equal bilaterally. No wheezes/rales/rhonchi. Gastrointestinal:   Soft and nontender. Non distended. There is no CVA tenderness.  No rebound, rigidity, or guarding. Genitourinary:   deferred Musculoskeletal:   Normal range of motion in all extremities. No joint effusions.  No lower extremity tenderness.  No edema.Straight leg raise positive on the right at 15. No midline spinal tenderness. Edema of bilateral lower extremities, right greater than left with increased calf circumference. Negative Homans sign. No palpable cords. Neurologic:   Normal speech and language.  Motor grossly intact.  No gross focal neurologic deficits are appreciated.  Skin:    Skin is warm, dry and intact. No rash noted.  No petechiae, purpura, or bullae.  ____________________________________________    LABS (pertinent positives/negatives) (all labs ordered are listed, but only abnormal results are displayed) Labs Reviewed  BASIC METABOLIC PANEL - Abnormal; Notable for the following:       Result Value   Calcium 8.7 (*)    All other components within normal limits  CBC - Abnormal; Notable for the following:    Hemoglobin 9.9 (*)    HCT 31.5 (*)    MCV 72.4 (*)    MCH 22.9 (*)    MCHC 31.6 (*)    RDW 18.4 (*)    All other components within normal limits  TROPONIN I  BRAIN NATRIURETIC PEPTIDE   ____________________________________________   EKG  Interpreted by me  Date: 07/18/2016  Rate: 77  Rhythm: normal sinus rhythm  QRS Axis: normal  Intervals: normal  ST/T Wave abnormalities: normal  Conduction Disutrbances: none  Narrative Interpretation: unremarkable      ____________________________________________    RADIOLOGY  Dg Chest 2 View  Result Date: 07/17/2016 CLINICAL DATA:  36 year old female with chest pain and shortness of breath. EXAM: CHEST  2 VIEW COMPARISON:  Chest radiograph dated 07/04/2016 FINDINGS: There is mild pulmonary  vascular prominence. No focal consolidation, pleural effusion, or pneumothorax. Stable top-normal cardiac size. There is moderate dextroscoliosis of the spine with fixation rod and cerclage wires. No acute osseous pathology. IMPRESSION: Top-normal cardiac size with mild vascular congestion. No focal consolidation. Electronically Signed   By: Elgie CollardArash  Radparvar M.D.   On: 07/17/2016 22:01   [place] Venous Img Lower Unilateral Right  Result Date: 07/18/2016 CLINICAL DATA:  Initial evaluation for acute right lower extremity edema, pain. EXAM: Right LOWER EXTREMITY VENOUS DOPPLER ULTRASOUND TECHNIQUE: Gray-scale sonography with graded compression, as well as color Doppler and duplex ultrasound were performed to evaluate the lower extremity deep venous systems from the level of the common femoral vein and including the common femoral, femoral, profunda femoral, popliteal and calf veins including the posterior tibial, peroneal and gastrocnemius veins when visible. The superficial great saphenous vein was also interrogated. Spectral Doppler was utilized to evaluate flow at rest and with distal augmentation maneuvers in the common femoral, femoral and popliteal veins. COMPARISON:  Prior ultrasound from 07/04/2016. FINDINGS: Contralateral Common Femoral Vein: Respiratory phasicity is normal and symmetric with the symptomatic side. No evidence of thrombus. Normal compressibility. Common Femoral Vein: No evidence of thrombus. Normal compressibility, respiratory phasicity and response to augmentation. Saphenofemoral Junction: No evidence of thrombus. Normal compressibility and flow on color Doppler imaging. Profunda Femoral Vein: No evidence of thrombus. Normal compressibility and flow on color Doppler imaging. Femoral Vein: No evidence of thrombus. Normal compressibility, respiratory phasicity and response to augmentation. Popliteal Vein: No evidence of thrombus. Normal compressibility, respiratory phasicity and response to  augmentation. Calf Veins: No evidence of thrombus. Normal compressibility and flow on color Doppler imaging. Peroneal vein not visualized. Superficial Great Saphenous Vein: No evidence of thrombus. Normal compressibility and flow on color Doppler imaging. Venous Reflux:  None. Other Findings:  None. IMPRESSION: No evidence of DVT within the right lower extremity. Electronically Signed   By: Rise MuBenjamin  McClintock M.D.   On: 07/18/2016 03:05    ____________________________________________   PROCEDURES Procedures  ____________________________________________   INITIAL IMPRESSION / ASSESSMENT AND PLAN / ED COURSE  Pertinent labs & imaging results that were available during my care of the  patient were reviewed by me and considered in my medical decision making (see chart for details).  Patient well appearing no acute distress, presents with multiple symptoms which are chronic in nature including her chest pain shortness of breath and edema. She has outpatient physicians from these and is compliant with her medications. However with the right leg swelling will have to ultrasound again to ensure she doesn't have a DVT. 2 weeks ago she was negative for DVT by ultrasound. Low suspicion for PE given the chronicity of the symptoms and her normal oxygenation heart rate blood pressure and respiratory rate on exam. No evidence of right heart strain on EKG.  On given IV Lasix, check ultrasound. Her acute complaints appear to be attributable to bulging lumbar disc which can be managed symptomatically. Otherwise her chronic issues can continue to be followed up by her outpatient primary care and cardiology.      ----------------------------------------- 5:20 AM on 07/18/2016 -----------------------------------------  Workup negative. Ultrasound negative. Vital signs remained normal. Patient had 500 mL urine output after IV Lasix. Continue her torsemide, follow-up with her cardiologist Dr. Juliann Pares  tomorrow.Considering the patient's symptoms, medical history, and physical examination today, I have low suspicion for ACS, PE, TAD, pneumothorax, carditis, mediastinitis, pneumonia, CHF, or sepsis.   ____________________________________________   FINAL CLINICAL IMPRESSION(S) / ED DIAGNOSES  Final diagnoses:  Peripheral edema  Sciatica of right side      New Prescriptions   No medications on file     Portions of this note were generated with dragon dictation software. Dictation errors may occur despite best attempts at proofreading.    Sharman Cheek, MD 07/18/16 [phone]

## 2016-08-13 ENCOUNTER — Encounter: Payer: Self-pay | Admitting: Family

## 2016-08-13 ENCOUNTER — Ambulatory Visit: Payer: 59 | Attending: Family | Admitting: Family

## 2016-08-13 ENCOUNTER — Telehealth: Payer: Self-pay | Admitting: Family

## 2016-08-13 VITALS — BP 118/80 | HR 106 | Resp 20 | Ht 61.0 in | Wt 353.1 lb

## 2016-08-13 DIAGNOSIS — E876 Hypokalemia: Secondary | ICD-10-CM | POA: Insufficient documentation

## 2016-08-13 DIAGNOSIS — M419 Scoliosis, unspecified: Secondary | ICD-10-CM | POA: Insufficient documentation

## 2016-08-13 DIAGNOSIS — Z9989 Dependence on other enabling machines and devices: Secondary | ICD-10-CM

## 2016-08-13 DIAGNOSIS — G8929 Other chronic pain: Secondary | ICD-10-CM | POA: Diagnosis not present

## 2016-08-13 DIAGNOSIS — M5441 Lumbago with sciatica, right side: Secondary | ICD-10-CM

## 2016-08-13 DIAGNOSIS — Z8249 Family history of ischemic heart disease and other diseases of the circulatory system: Secondary | ICD-10-CM | POA: Insufficient documentation

## 2016-08-13 DIAGNOSIS — Z6841 Body Mass Index (BMI) 40.0 and over, adult: Secondary | ICD-10-CM | POA: Insufficient documentation

## 2016-08-13 DIAGNOSIS — F419 Anxiety disorder, unspecified: Secondary | ICD-10-CM | POA: Diagnosis not present

## 2016-08-13 DIAGNOSIS — I5032 Chronic diastolic (congestive) heart failure: Secondary | ICD-10-CM | POA: Diagnosis present

## 2016-08-13 DIAGNOSIS — G4733 Obstructive sleep apnea (adult) (pediatric): Secondary | ICD-10-CM

## 2016-08-13 DIAGNOSIS — Z7951 Long term (current) use of inhaled steroids: Secondary | ICD-10-CM | POA: Insufficient documentation

## 2016-08-13 DIAGNOSIS — Z886 Allergy status to analgesic agent status: Secondary | ICD-10-CM | POA: Diagnosis not present

## 2016-08-13 DIAGNOSIS — Z87891 Personal history of nicotine dependence: Secondary | ICD-10-CM | POA: Diagnosis not present

## 2016-08-13 DIAGNOSIS — Z79899 Other long term (current) drug therapy: Secondary | ICD-10-CM | POA: Insufficient documentation

## 2016-08-13 DIAGNOSIS — Z833 Family history of diabetes mellitus: Secondary | ICD-10-CM | POA: Insufficient documentation

## 2016-08-13 DIAGNOSIS — M545 Low back pain: Secondary | ICD-10-CM | POA: Diagnosis not present

## 2016-08-13 LAB — BASIC METABOLIC PANEL
ANION GAP: 12 (ref 5–15)
BUN: 12 mg/dL (ref 6–20)
CALCIUM: 9.3 mg/dL (ref 8.9–10.3)
CO2: 29 mmol/L (ref 22–32)
CREATININE: 0.72 mg/dL (ref 0.44–1.00)
Chloride: 96 mmol/L — ABNORMAL LOW (ref 101–111)
GFR calc Af Amer: >60 mL/min (ref >60)
Glucose, Bld: 94 mg/dL (ref 65–99)
Potassium: 3.2 mmol/L — ABNORMAL LOW (ref 3.5–5.1)
Sodium: 137 mmol/L (ref 135–145)

## 2016-08-13 MED ORDER — POTASSIUM CHLORIDE CRYS ER 20 MEQ PO TBCR: 20.0000 meq | EXTENDED_RELEASE_TABLET | Freq: Two times a day (BID) | ORAL | 5 refills | Status: DC

## 2016-08-13 MED ORDER — BUMETANIDE 2 MG PO TABS: 2.0000 mg | ORAL_TABLET | Freq: Two times a day (BID) | ORAL | 3 refills | Status: DC

## 2016-08-13 NOTE — Telephone Encounter (Signed)
Spoke again with patient and advised her to also stop her HCTZ. Patient verbalized understanding.

## 2016-08-13 NOTE — Patient Instructions (Addendum)
Continue weighing daily and call for an overnight weight gain of > 2 pounds or a weekly weight gain of >5 pounds.  Maintain fluid intake to 60 ounces daily.   [facility]   [phone]  [address]  [place] [id number]  Hours: Tuesday: 4:15 pm - 7:30 pm  Wednesday: 9 am - 1 pm  Thursday: 1 pm - 7:30 pm  Friday - Monday: CLOSED

## 2016-08-13 NOTE — Addendum Note (Signed)
Addended by: Clarisa KindredHACKNEY, TINA A on: 08/13/2016 02:00 PM   Modules accepted: Orders

## 2016-08-13 NOTE — Progress Notes (Signed)
Patient ID: Tracey White, female    DOB: [birth year], 36 y.o.   MRN: [id number]  HPI  Tracey White is a 36 y/o female with a history of anxiety, obstructive sleep apnea (with nasal CPAP), scoliosis, recent tobacco use and chronic heart failure.   Reviewed last echo report done 10/19/15 which showed an EF of 65-70% along with trivial TR.   Was in the ED 08/05/16 with peripheral edema. Was treated with bumex with good response and discharged home. Was in the ED 07/17/16 due to low back pain along with peripheral edema. + bulging lumbar disc; Negative for DVT, IV diuretics given and she was released home. Was in the ED 07/04/16 due to chest pain & dizziness due to extra diuretic that patient took on her own at home. Treated and released.  Admitted 05/31/16 with chest pain and HF exacerbation. Initially treated with IV furosemide and transitioned to oral diuretics. Cardiology consult obtained. Discharged the following day. Was in the ED 05/11/16 with a persistent cough due to HF exacerbation. Was in the ED 12/25/15 with leg edema.  She presents today for a follow-up visit with a chief complaint of moderate shortness of breath with minimal exertion. She says that this has been present for many months with varying levels of severity. She feels like her breathing has worsened some over the last few weeks. She has associated fatigue, head congestion, pedal edema, cough and intermittent chest pain along with this.   Past Medical History:  Diagnosis Date  . Anxiety   . Chronic heart failure (HCC)   . Morbid obesity (HCC)   . OSA (obstructive sleep apnea)    uses CPAP at home  . Peripheral edema   . Scoliosis    Past Surgical History:  Procedure Laterality Date  . HERNIA REPAIR    . scoliosis repair    . TUBAL LIGATION     Family History  Problem Relation Age of Onset  . Diabetes Mother   . Hypertension Mother    Social History  Substance Use Topics  . Smoking status: Former Smoker    Packs/day: 0.50    Types: Cigarettes    Quit date: 03/27/2016  . Smokeless tobacco: Never Used  . Alcohol use Yes     Comment: occasionally   Allergies  Allergen Reactions  . Aspirin Cough        Prior to Admission medications   Medication Sig Start Date End Date Taking? Authorizing Provider  albuterol (PROVENTIL HFA;VENTOLIN HFA) 108 (90 Base) MCG/ACT inhaler Inhale 2-4 puffs by mouth every 4 hours as needed for wheezing, cough, and/or shortness of breath 05/12/16  Yes Loleta Shye, MD  ALPRAZolam Prudy Feeler) 0.5 MG tablet Take 1 tablet by mouth daily as needed for anxiety.  11/14/15  Yes [provider]  beclomethasone (QVAR) 40 MCG/ACT inhaler Inhale 1 puff into the lungs 2 (two) times daily. 10/20/15  Yes Wieting, Richard, MD  benzonatate (TESSALON) 100 MG capsule Take 100 mg by mouth 2 (two) times daily as needed for cough.   Yes [provider]  escitalopram (LEXAPRO) 20 MG tablet Take 20 mg by mouth daily.    Yes [provider]  gabapentin (NEURONTIN) 100 MG capsule Take 100-200 mg by mouth 3 (three) times daily.   Yes [provider]  hydrochlorothiazide (MICROZIDE) 12.5 MG capsule Take 12.5 mg by mouth daily.   Yes [provider]  losartan (COZAAR) 25 MG tablet Take 25 mg by mouth daily.   Yes  [provider]  meloxicam (MOBIC) 15 MG tablet Take 15 mg by mouth daily.   Yes [provider]  metoprolol succinate (TOPROL-XL) 25 MG 24 hr tablet Take 1 tablet by mouth daily. 10/30/15  Yes [provider]  montelukast (SINGULAIR) 10 MG tablet Take 10 mg by mouth at bedtime.   Yes [provider]  torsemide (DEMADEX) 20 MG tablet Take 2 tablets (40 mg total) by mouth 2 (two) times daily. 05/15/16  Yes Delma Freeze, FNP  Elastic Bandages & Supports (T.E.D. BELOW KNEE/LARGE) MISC 2 application by Does not apply route daily. Patient not taking: Reported on 08/13/2016 12/25/15   Willy Eddy, MD  lidocaine (LIDODERM) 5 % Place  1 patch onto the skin daily. Remove & Discard patch within 12 hours or as directed by MD    [provider]  metolazone (ZAROXOLYN) 5 MG tablet Take 1 tablet (5 mg total) by mouth daily. 05/30/16 06/02/16  Clarisa Kindred A, FNP  potassium chloride SA (K-DUR,KLOR-CON) 20 MEQ tablet Take 1 tablet (20 mEq total) by mouth 2 (two) times daily. Patient not taking: Reported on 08/13/2016 05/15/16 07/14/16  Delma Freeze, FNP   Review of Systems  Constitutional: Positive for fatigue. Negative for appetite change.  HENT: Positive for congestion. Negative for postnasal drip and sore throat.   Eyes: Negative.   Respiratory: Positive for cough and shortness of breath. Negative for chest tightness and wheezing.   Cardiovascular: Positive for chest pain (when walking long distances) and leg swelling. Negative for palpitations.  Gastrointestinal: Negative for abdominal distention and abdominal pain.  Endocrine: Negative.   Genitourinary: Negative.   Musculoskeletal: Positive for arthralgias (knees) and back pain. Negative for neck pain.  Skin: Negative.   Allergic/Immunologic: Negative.   Neurological: Positive for light-headedness (when coughing too much). Negative for dizziness.  Hematological: Negative for adenopathy. Does not bruise/bleed easily.  Psychiatric/Behavioral: Positive for sleep disturbance (not wearing CPAP). Negative for dysphoric mood. The patient is not nervous/anxious.    Vitals:   08/13/16 0901  BP: 118/80  Pulse: (!) 106  Resp: 20  SpO2: 100%  Weight: (!) 353 lb 2 oz (160.2 kg)  Height: 5\' 1"  (1.549 m)   Wt Readings from Last 3 Encounters:  08/13/16 (!) 353 lb 2 oz (160.2 kg)  07/17/16 (!) 365 lb (165.6 kg)  07/04/16 (!) 358 lb (162.4 kg)    Lab Results  Component Value Date   CREATININE 0.75 07/17/2016   CREATININE 0.82 07/04/2016   CREATININE 0.84 05/30/2016    Physical Exam  Constitutional: She is oriented to person, place, and time. She appears well-developed  and well-nourished.  HENT:  Head: Normocephalic and atraumatic.  Neck: Normal range of motion. Neck supple. No JVD present.  Cardiovascular: Regular rhythm.  Tachycardia present.   Pulmonary/Chest: Effort normal. She has no wheezes. She has no rhonchi. She has no rales.  Abdominal: Soft. She exhibits no distension. There is no tenderness.  Musculoskeletal: She exhibits edema (1+ pitting edema in bilateral lower legs). She exhibits no tenderness.  Neurological: She is alert and oriented to person, place, and time.  Skin: Skin is warm and dry.  Psychiatric: She has a normal mood and affect. Her behavior is normal. Thought content normal.  Nursing note and vitals reviewed.   Assessment & Plan:  1: Chronic heart failure with preserved ejection fraction- - NYHA class III -mildly fluid overloaded today with pedal edema - weight down 8 pounds from recent ED visit. Reminded to call  for an overnight weight gain of >2 pounds or a weekly weight gain of >5 pounds. - says that she's drinking 64- 96 ounces of water daily. Discussed dropping fluid intake to between 50-60 ounces of fluid daily. Can use hard candy or gum for a dry mouth - not adding salt and has been trying to read food labels more often - does wear TED socks at home and she was encouraged to elevate her legs when she could - discussed changing her diuretic to bumex as she felt like she responded better to that.  - BMP drawn today and will call patient with results to consider changing diuretic - saw cardiologist Juliann Pares([place]) 05/22/16 - PharmD went in and reviewed medications with the patient  2: Hypokalemia- - BMP drawn on 07/17/16 reviewed and shows a potassium of 3.8 and GFR of >60 - has not been taking her potassium supplements as she currently can't afford them  3: Obstructive sleep apnea- - not wearing her CPAP at this time as she says that she just can't get used to it - saw PCP Larwance Sachs([place]) 07/04/16  4: Chronic low back pain- -  has history of scoliosis  - lumbar bulging disc on xray - information given regarding [facility] so that the appropriate referrals can be made - d/c meloxicam and try ES Tylenol if needed for pain; she says the meloxicam isn't helping anyways  Medication bottles were reviewed.   Return in 2 weeks or sooner for any questions/problems before then.

## 2016-08-13 NOTE — Telephone Encounter (Signed)
Spoke with patient regarding her lab results that were obtained on 08/13/16. Potassium is low at 3.2 and GFR is >60. She had not been taking her potassium supplements because she was waiting on her medicaid card to come in and couldn't afford to pay cash for the potassium supplements.  Patient says that her medicaid card came in the mail today so she can now afford the medication. She has already made an appointment with her PCP on 08/19/16.  Will send in a prescription for her potassium at 20meq BID. Will also change her diuretic to bumex 2mg  twice daily. She is to stop the torsemide and verbalizes understanding that she should not take bumex and torsemide together.  PCP can re-check labs on 08/19/16 or we can check them at her next visit on 08/27/16.

## 2016-08-27 ENCOUNTER — Encounter: Payer: Self-pay | Admitting: Family

## 2016-08-27 ENCOUNTER — Ambulatory Visit: Payer: Medicaid Other | Attending: Family | Admitting: Family

## 2016-08-27 VITALS — BP 130/80 | HR 108 | Resp 20 | Ht 61.0 in | Wt 361.4 lb

## 2016-08-27 DIAGNOSIS — M419 Scoliosis, unspecified: Secondary | ICD-10-CM | POA: Diagnosis not present

## 2016-08-27 DIAGNOSIS — G8929 Other chronic pain: Secondary | ICD-10-CM | POA: Insufficient documentation

## 2016-08-27 DIAGNOSIS — Z9989 Dependence on other enabling machines and devices: Secondary | ICD-10-CM

## 2016-08-27 DIAGNOSIS — M5441 Lumbago with sciatica, right side: Secondary | ICD-10-CM

## 2016-08-27 DIAGNOSIS — F419 Anxiety disorder, unspecified: Secondary | ICD-10-CM | POA: Insufficient documentation

## 2016-08-27 DIAGNOSIS — F329 Major depressive disorder, single episode, unspecified: Secondary | ICD-10-CM | POA: Insufficient documentation

## 2016-08-27 DIAGNOSIS — F32A Depression, unspecified: Secondary | ICD-10-CM | POA: Insufficient documentation

## 2016-08-27 DIAGNOSIS — Z87891 Personal history of nicotine dependence: Secondary | ICD-10-CM | POA: Diagnosis not present

## 2016-08-27 DIAGNOSIS — R600 Localized edema: Secondary | ICD-10-CM | POA: Insufficient documentation

## 2016-08-27 DIAGNOSIS — M545 Low back pain, unspecified: Secondary | ICD-10-CM | POA: Insufficient documentation

## 2016-08-27 DIAGNOSIS — G4733 Obstructive sleep apnea (adult) (pediatric): Secondary | ICD-10-CM | POA: Diagnosis not present

## 2016-08-27 DIAGNOSIS — R0602 Shortness of breath: Secondary | ICD-10-CM | POA: Insufficient documentation

## 2016-08-27 DIAGNOSIS — I5032 Chronic diastolic (congestive) heart failure: Secondary | ICD-10-CM | POA: Diagnosis not present

## 2016-08-27 DIAGNOSIS — Z79899 Other long term (current) drug therapy: Secondary | ICD-10-CM | POA: Diagnosis not present

## 2016-08-27 DIAGNOSIS — M5442 Lumbago with sciatica, left side: Secondary | ICD-10-CM

## 2016-08-27 LAB — BASIC METABOLIC PANEL
ANION GAP: 8 (ref 5–15)
BUN: 11 mg/dL (ref 6–20)
CALCIUM: 8.9 mg/dL (ref 8.9–10.3)
CO2: 27 mmol/L (ref 22–32)
Chloride: 107 mmol/L (ref 101–111)
Creatinine, Ser: 0.69 mg/dL (ref 0.44–1.00)
Glucose, Bld: 91 mg/dL (ref 65–99)
POTASSIUM: 4 mmol/L (ref 3.5–5.1)
Sodium: 142 mmol/L (ref 135–145)

## 2016-08-27 NOTE — Patient Instructions (Signed)
Continue weighing daily and call for an overnight weight gain of > 2 pounds or a weekly weight gain of >5 pounds. 

## 2016-08-27 NOTE — Progress Notes (Signed)
Patient ID: Tracey White, female    DOB: 1980-04-21, 36 y.o.   MRN: [id number]  HPI  Tracey White is a 36 y/o female with a history of anxiety, obstructive sleep apnea (with nasal CPAP), scoliosis, recent tobacco use and chronic heart failure.   Reviewed last echo report done 10/19/15 which showed an EF of 65-70% along with trivial TR.   Was in the ED 08/05/16 with peripheral edema. Was treated with bumex with good response and discharged home. Was in the ED 07/17/16 due to low back pain along with peripheral edema. + bulging lumbar disc; Negative for DVT, IV diuretics given and she was released home. Was in the ED 07/04/16 due to chest pain & dizziness due to extra diuretic that patient took on her own at home. Treated and released.  Admitted 05/31/16 with chest pain and HF exacerbation. Initially treated with IV furosemide and transitioned to oral diuretics. Cardiology consult obtained. Discharged the following day. Was in the ED 05/11/16 with a persistent cough due to HF exacerbation. Was in the ED 12/25/15 with leg edema.  She presents today for a follow-up visit with a chief complaint of mild shortness of breath with minimal exertion. She describes this as chronic in nature having been present for years with varying levels of severity. She does feel like her shortness of breath has been worsening recently. She has associated fatigue, cough, wheezing, swelling and weight gain along with this. She feels like the bumex is working except that when she's standing/sitting for a short period of time, the edema quickly returns.   Past Medical History:  Diagnosis Date  . Anxiety   . Chronic heart failure (HCC)   . Morbid obesity (HCC)   . OSA (obstructive sleep apnea)    uses CPAP at home  . Peripheral edema   . Scoliosis    Past Surgical History:  Procedure Laterality Date  . HERNIA REPAIR    . scoliosis repair    . TUBAL LIGATION     Family History  Problem Relation Age of Onset  . Diabetes Mother    . Hypertension Mother    Social History  Substance Use Topics  . Smoking status: Former Smoker    Packs/day: 0.50    Types: Cigarettes    Quit date: 03/27/2016  . Smokeless tobacco: Never Used  . Alcohol use Yes     Comment: occasionally   Allergies  Allergen Reactions  . Aspirin Cough        Prior to Admission medications   Medication Sig Start Date End Date Taking? Authorizing Provider  albuterol (PROVENTIL HFA;VENTOLIN HFA) 108 (90 Base) MCG/ACT inhaler Inhale 2-4 puffs by mouth every 4 hours as needed for wheezing, cough, and/or shortness of breath 05/12/16  Yes Loleta Amzie, MD  ALPRAZolam Prudy Feeler) 0.5 MG tablet Take 1 tablet by mouth daily as needed for anxiety.  11/14/15  Yes [provider]  beclomethasone (QVAR) 40 MCG/ACT inhaler Inhale 1 puff into the lungs 2 (two) times daily. 10/20/15  Yes Wieting, Richard, MD  benzonatate (TESSALON) 100 MG capsule Take 100 mg by mouth 2 (two) times daily as needed for cough.   Yes [provider]  bumetanide (BUMEX) 2 MG tablet Take 1 tablet (2 mg total) by mouth 2 (two) times daily. 08/13/16  Yes Kinzie Wickes, Inetta Fermo A, FNP  escitalopram (LEXAPRO) 20 MG tablet Take 20 mg by mouth daily.    Yes [provider]  gabapentin (NEURONTIN) 100 MG capsule Take 100-200  mg by mouth 3 (three) times daily.   Yes [provider]  losartan (COZAAR) 25 MG tablet Take 25 mg by mouth daily.   Yes [provider]  metoprolol succinate (TOPROL-XL) 25 MG 24 hr tablet Take 1 tablet by mouth daily. 10/30/15  Yes [provider]  montelukast (SINGULAIR) 10 MG tablet Take 10 mg by mouth at bedtime.   Yes [provider]  potassium chloride SA (K-DUR,KLOR-CON) 20 MEQ tablet Take 1 tablet (20 mEq total) by mouth 2 (two) times daily. 08/13/16 10/12/16 Yes Delma FreezeHackney, Alishah Schulte A, FNP    Review of Systems  Constitutional: Positive for fatigue. Negative for appetite change.  HENT: Positive for congestion. Negative for  postnasal drip.   Eyes: Negative.   Respiratory: Positive for cough, shortness of breath and wheezing. Negative for chest tightness.   Cardiovascular: Positive for leg swelling. Negative for chest pain and palpitations.  Gastrointestinal: Negative for abdominal distention and abdominal pain.  Endocrine: Negative.   Genitourinary: Negative.   Musculoskeletal: Positive for arthralgias (knees) and back pain.  Skin: Negative.   Allergic/Immunologic: Negative.   Neurological: Positive for light-headedness (when coughing too much). Negative for dizziness.  Hematological: Negative for adenopathy. Does not bruise/bleed easily.  Psychiatric/Behavioral: Positive for dysphoric mood and sleep disturbance (not wearing CPAP). Negative for suicidal ideas. The patient is not nervous/anxious.    Vitals:   08/27/16 1311  Pulse: (!) 108  Resp: 20  SpO2: 96%  Weight: (!) 361 lb 6 oz (163.9 kg)  Height: 5\' 1"  (1.549 m)   Wt Readings from Last 3 Encounters:  08/27/16 (!) 361 lb 6 oz (163.9 kg)  08/13/16 (!) 353 lb 2 oz (160.2 kg)  07/17/16 (!) 365 lb (165.6 kg)    Lab Results  Component Value Date   CREATININE 0.72 08/13/2016   CREATININE 0.75 07/17/2016   CREATININE 0.82 07/04/2016    Physical Exam  Constitutional: She is oriented to person, place, and time. She appears well-developed and well-nourished.  HENT:  Head: Normocephalic and atraumatic.  Neck: Normal range of motion. Neck supple. No JVD present.  Cardiovascular: Regular rhythm.  Tachycardia present.   Pulmonary/Chest: Effort normal. She has no wheezes. She has no rhonchi. She has no rales.  Abdominal: Soft. She exhibits no distension. There is no tenderness.  Musculoskeletal: She exhibits edema (1+ pitting edema in bilateral lower legs). She exhibits no tenderness.  Neurological: She is alert and oriented to person, place, and time.  Skin: Skin is warm and dry.  Psychiatric: She has a normal mood and affect. Her behavior is  normal. Thought content normal.  Nursing note and vitals reviewed.   Assessment & Plan:  1: Chronic heart failure with preserved ejection fraction- - NYHA class III -mildly fluid overloaded today with pedal edema - weight up 8 pounds from when she was last here. Reminded to call for an overnight weight gain of >2 pounds or a weekly weight gain of >5 pounds. - trying to decrease her fluid intake but says that she's probably still drinking too much fluid - not adding salt and has been trying to read food labels more often - does wear TED socks at home and she was encouraged to elevate her legs when she could - changed her diuretic to bumex 2mg  BID at last visit - saw cardiologist Juliann Pares([place]) 05/22/16 - BMP from 08/13/16 reviewed and shows sodium 137, potassium 3.2 and GFR >60 - BMP repeated today; if renal function looks good, can try increasing her bumex  2: Depression- - feels like her depression is worsening because of her health concerns - had run out of her lexapro but it's recently been resumed - has been to [facility] in the past and is working on getting re-established with them - denies suicidal thoughts/plans  3: Obstructive sleep apnea- - not wearing her CPAP at this time as she says that she just can't get used to it - also says that it's not even blowing air; called [facility] in [place] and was told that she has to bring the equipment to them for it to be looked at and she says that she doesn't have the gas money to get up there - saw PCP Larwance Sachs([place]) 08/19/16  4: Chronic low back pain- - has history of scoliosis  - lumbar bulging disc on xray - stopped meloxicam and tylenol didn't help so PCP gave voltaren gel and patient reports minimal relief with gel  Patient did not bring her medications nor a list. Each medication was verbally reviewed with the patient and she was encouraged to bring the bottles to every visit to confirm accuracy of list.  Return in 2 weeks or sooner for any  questions/problems before then.

## 2016-08-28 ENCOUNTER — Telehealth: Payer: Self-pay | Admitting: Family

## 2016-08-28 NOTE — Telephone Encounter (Signed)
Spoke with patient regarding lab work that was obtained on 08/27/16. Advise patient that her potassium level and renal function were all normal. Currently taking 2mg  bumex BID. If she needs to take an extra bumex based on weight/edema, advised her that she could but that she also needed to take an extra potassium tablet with it. Also advised patient to monitor fluid status carefully to make sure that she's not drinking more than 60 ounces of fluid daily. Patient verbalized understanding.

## 2016-09-04 ENCOUNTER — Ambulatory Visit: Payer: Medicaid Other | Attending: Family | Admitting: Family

## 2016-09-04 ENCOUNTER — Telehealth: Payer: Self-pay | Admitting: Family

## 2016-09-04 ENCOUNTER — Encounter: Payer: Self-pay | Admitting: Family

## 2016-09-04 VITALS — BP 120/80 | HR 88 | Resp 20 | Ht 61.0 in | Wt 348.4 lb

## 2016-09-04 DIAGNOSIS — Z79899 Other long term (current) drug therapy: Secondary | ICD-10-CM | POA: Diagnosis not present

## 2016-09-04 DIAGNOSIS — F419 Anxiety disorder, unspecified: Secondary | ICD-10-CM | POA: Insufficient documentation

## 2016-09-04 DIAGNOSIS — R05 Cough: Secondary | ICD-10-CM | POA: Insufficient documentation

## 2016-09-04 DIAGNOSIS — G4733 Obstructive sleep apnea (adult) (pediatric): Secondary | ICD-10-CM | POA: Diagnosis not present

## 2016-09-04 DIAGNOSIS — Z833 Family history of diabetes mellitus: Secondary | ICD-10-CM | POA: Diagnosis not present

## 2016-09-04 DIAGNOSIS — Z9889 Other specified postprocedural states: Secondary | ICD-10-CM | POA: Diagnosis not present

## 2016-09-04 DIAGNOSIS — R0602 Shortness of breath: Secondary | ICD-10-CM | POA: Insufficient documentation

## 2016-09-04 DIAGNOSIS — M5126 Other intervertebral disc displacement, lumbar region: Secondary | ICD-10-CM | POA: Insufficient documentation

## 2016-09-04 DIAGNOSIS — F329 Major depressive disorder, single episode, unspecified: Secondary | ICD-10-CM | POA: Insufficient documentation

## 2016-09-04 DIAGNOSIS — I5032 Chronic diastolic (congestive) heart failure: Secondary | ICD-10-CM | POA: Diagnosis present

## 2016-09-04 DIAGNOSIS — R062 Wheezing: Secondary | ICD-10-CM | POA: Diagnosis not present

## 2016-09-04 DIAGNOSIS — M419 Scoliosis, unspecified: Secondary | ICD-10-CM | POA: Insufficient documentation

## 2016-09-04 DIAGNOSIS — R42 Dizziness and giddiness: Secondary | ICD-10-CM | POA: Diagnosis not present

## 2016-09-04 DIAGNOSIS — Z87891 Personal history of nicotine dependence: Secondary | ICD-10-CM | POA: Diagnosis not present

## 2016-09-04 DIAGNOSIS — Z8249 Family history of ischemic heart disease and other diseases of the circulatory system: Secondary | ICD-10-CM | POA: Insufficient documentation

## 2016-09-04 DIAGNOSIS — L989 Disorder of the skin and subcutaneous tissue, unspecified: Secondary | ICD-10-CM

## 2016-09-04 DIAGNOSIS — Z9989 Dependence on other enabling machines and devices: Secondary | ICD-10-CM

## 2016-09-04 DIAGNOSIS — Z888 Allergy status to other drugs, medicaments and biological substances status: Secondary | ICD-10-CM | POA: Insufficient documentation

## 2016-09-04 LAB — BASIC METABOLIC PANEL
ANION GAP: 10 (ref 5–15)
BUN: 16 mg/dL (ref 6–20)
CO2: 29 mmol/L (ref 22–32)
Calcium: 9.3 mg/dL (ref 8.9–10.3)
Chloride: 101 mmol/L (ref 101–111)
Creatinine, Ser: 0.8 mg/dL (ref 0.44–1.00)
GFR calc Af Amer: >60 mL/min (ref >60)
GFR calc non Af Amer: >60 mL/min (ref >60)
GLUCOSE: 105 mg/dL — AB (ref 65–99)
Potassium: 3.7 mmol/L (ref 3.5–5.1)
Sodium: 140 mmol/L (ref 135–145)

## 2016-09-04 NOTE — Progress Notes (Signed)
Patient ID: Tracey White, female    DOB: August 20, 1980, 36 y.o.   MRN: [id number]  HPI  Tracey White is a 36 y/o female with a history of anxiety, obstructive sleep apnea (with nasal CPAP), scoliosis, recent tobacco use and chronic heart failure.   Reviewed last echo report done 10/19/15 which showed an EF of 65-70% along with trivial TR.   Was in the ED 08/05/16 with peripheral edema. Was treated with bumex with good response and discharged home. Was in the ED 07/17/16 due to low back pain along with peripheral edema. + bulging lumbar disc; Negative for DVT, IV diuretics given and she was released home. Was in the ED 07/04/16 due to chest pain & dizziness due to extra diuretic that patient took on her own at home. Treated and released.  Admitted 05/31/16 with chest pain and HF exacerbation. Initially treated with IV furosemide and transitioned to oral diuretics. Cardiology consult obtained. Discharged the following day. Was in the ED 05/11/16 with a persistent cough due to HF exacerbation. Was in the ED 12/25/15 with leg edema.  She presents today for a follow-up visit with a chief complaint of mild shortness of breath with minimal exertion. She describes this as chronic in nature having been present for years with varying levels of severity. She has associated fatigue, cough, wheezing and light-headedness. She does feel like her depression is worsening which is affecting her overall health.   Past Medical History:  Diagnosis Date  . Anxiety   . Chronic heart failure (HCC)   . Morbid obesity (HCC)   . OSA (obstructive sleep apnea)    uses CPAP at home  . Peripheral edema   . Scoliosis    Past Surgical History:  Procedure Laterality Date  . HERNIA REPAIR    . scoliosis repair    . TUBAL LIGATION     Family History  Problem Relation Age of Onset  . Diabetes Mother   . Hypertension Mother    Social History  Substance Use Topics  . Smoking status: Former Smoker    Packs/day: 0.50    Types:  Cigarettes    Quit date: 03/27/2016  . Smokeless tobacco: Never Used  . Alcohol use Yes     Comment: occasionally   Allergies  Allergen Reactions  . Aspirin Cough        Prior to Admission medications   Medication Sig Start Date End Date Taking? Authorizing Provider  albuterol (PROVENTIL HFA;VENTOLIN HFA) 108 (90 Base) MCG/ACT inhaler Inhale 2-4 puffs by mouth every 4 hours as needed for wheezing, cough, and/or shortness of breath 05/12/16  Yes Loleta RoseForbach, Cory, MD  ALPRAZolam Prudy Feeler(XANAX) 0.5 MG tablet Take 1 tablet by mouth daily as needed for anxiety.  11/14/15  Yes [provider]  beclomethasone (QVAR) 40 MCG/ACT inhaler Inhale 1 puff into the lungs 2 (two) times daily. 10/20/15  Yes Wieting, Richard, MD  benzonatate (TESSALON) 100 MG capsule Take 100 mg by mouth 2 (two) times daily as needed for cough.   Yes [provider]  bumetanide (BUMEX) 2 MG tablet Take 4 mg by mouth 2 (two) times daily.   Yes [provider]  escitalopram (LEXAPRO) 20 MG tablet Take 20 mg by mouth daily.    Yes [provider]  gabapentin (NEURONTIN) 100 MG capsule Take 100-200 mg by mouth 3 (three) times daily.   Yes [provider]  losartan (COZAAR) 25 MG tablet Take 25 mg by mouth daily.   Yes [provider]  metoprolol succinate (TOPROL-XL) 25 MG 24 hr tablet Take 1 tablet by mouth daily. 10/30/15  Yes [provider]  montelukast (SINGULAIR) 10 MG tablet Take 10 mg by mouth at bedtime.   Yes [provider]  potassium chloride SA (K-DUR,KLOR-CON) 20 MEQ tablet Take 40 mEq by mouth 2 (two) times daily.   Yes [provider]   Review of Systems  Constitutional: Positive for fatigue. Negative for appetite change.  HENT: Positive for congestion. Negative for postnasal drip and sore throat.   Eyes: Negative.   Respiratory: Positive for cough, shortness of breath and wheezing. Negative for chest tightness.   Cardiovascular: Negative for  chest pain, palpitations and leg swelling.  Gastrointestinal: Negative for abdominal distention and abdominal pain.  Endocrine: Negative.   Genitourinary: Negative.   Musculoskeletal: Positive for arthralgias (knees) and back pain.  Skin: Negative.   Allergic/Immunologic: Negative.   Neurological: Positive for light-headedness (when coughing too much). Negative for dizziness.  Hematological: Negative for adenopathy. Does not bruise/bleed easily.  Psychiatric/Behavioral: Positive for dysphoric mood and sleep disturbance (has resumed wearing CPAP). Negative for suicidal ideas. The patient is not nervous/anxious.    Vitals:   09/04/16 0855  Pulse: 88  Resp: 20  SpO2: 99%  Weight: (!) 348 lb 6 oz (158 kg)  Height: 5\' 1"  (1.549 m)   Wt Readings from Last 3 Encounters:  09/04/16 (!) 348 lb 6 oz (158 kg)  08/27/16 (!) 361 lb 6 oz (163.9 kg)  08/13/16 (!) 353 lb 2 oz (160.2 kg)    Lab Results  Component Value Date   CREATININE 0.69 08/27/2016   CREATININE 0.72 08/13/2016   CREATININE 0.75 07/17/2016    Physical Exam  Constitutional: She is oriented to person, place, and time. She appears well-developed and well-nourished.  HENT:  Head: Normocephalic and atraumatic.  Neck: Normal range of motion. Neck supple. No JVD present.  Cardiovascular: Normal rate and regular rhythm.   Pulmonary/Chest: Effort normal. She has no wheezes. She has no rhonchi. She has no rales.  Abdominal: Soft. She exhibits no distension. There is no tenderness.  Musculoskeletal: She exhibits no edema or tenderness.  Neurological: She is alert and oriented to person, place, and time.  Skin: Skin is warm and dry. Lesion (left shin ~ nickel size) noted.  Psychiatric: She has a normal mood and affect. Her behavior is normal. Thought content normal.  Nursing note and vitals reviewed.   Assessment & Plan:  1: Chronic heart failure with preserved ejection fraction- - NYHA class III - euvolemic - weight 13  pounds from when she was last here. Reminded to call for an overnight weight gain of >2 pounds or a weekly weight gain of >5 pounds. - not adding salt and has been trying to read food labels more often - does wear TED socks at home and she was encouraged to elevate her legs when she could - now taking bumex 4mg  BID  - saw cardiologist Juliann Pares) 05/22/16 - BMP from 08/27/16 shows sodium 140, potassium 3.7 and GFR>60 - will check a BMP today since she's taking 4mg  bumex BID  2: Depression- - feels like her depression is worsening because of her health concerns - going to [facility] soon - emotional support offered - denies suicidal thoughts/plans  3: Obstructive sleep apnea- -  wearing her nasal CPAP  - is working on getting full facemask - saw PCP Larwance Sachs) 09/02/16  4: Skin lesion of left lower leg- - unsure of origin -  currently being treated with antibiotic cream but she didn't bring it and doesn't remember what it's called - is supposed to follow back up with PCP if it doesn't begin to heal in a couple of days  Patient did not bring her medications nor a list. Each medication was verbally reviewed with the patient and she was encouraged to bring the bottles to every visit to confirm accuracy of list.  Return in 2 weeks or sooner for any questions/problems before then.

## 2016-09-04 NOTE — Telephone Encounter (Signed)
Spoke with patient about lab work that was drawn today (09/04/16). Advised patient that her potassium and renal function are all normal. She can continue taking 4mg  bumex BID and 40meq potassium BID.

## 2016-09-04 NOTE — Patient Instructions (Signed)
Continue weighing daily and call for an overnight weight gain of > 2 pounds or a weekly weight gain of >5 pounds. 

## 2016-09-05 DIAGNOSIS — L989 Disorder of the skin and subcutaneous tissue, unspecified: Secondary | ICD-10-CM | POA: Insufficient documentation

## 2016-09-10 ENCOUNTER — Ambulatory Visit: Payer: Self-pay | Admitting: Family

## 2016-09-24 ENCOUNTER — Encounter: Payer: Self-pay | Admitting: Family

## 2016-09-24 ENCOUNTER — Ambulatory Visit: Payer: Medicaid Other | Attending: Family | Admitting: Family

## 2016-09-24 VITALS — BP 120/80 | HR 117 | Resp 20 | Ht 61.0 in | Wt 357.1 lb

## 2016-09-24 DIAGNOSIS — I509 Heart failure, unspecified: Secondary | ICD-10-CM | POA: Diagnosis not present

## 2016-09-24 DIAGNOSIS — Z87891 Personal history of nicotine dependence: Secondary | ICD-10-CM | POA: Diagnosis not present

## 2016-09-24 DIAGNOSIS — R609 Edema, unspecified: Secondary | ICD-10-CM | POA: Insufficient documentation

## 2016-09-24 DIAGNOSIS — I5032 Chronic diastolic (congestive) heart failure: Secondary | ICD-10-CM

## 2016-09-24 DIAGNOSIS — G4733 Obstructive sleep apnea (adult) (pediatric): Secondary | ICD-10-CM | POA: Insufficient documentation

## 2016-09-24 DIAGNOSIS — F419 Anxiety disorder, unspecified: Secondary | ICD-10-CM | POA: Insufficient documentation

## 2016-09-24 DIAGNOSIS — M419 Scoliosis, unspecified: Secondary | ICD-10-CM | POA: Diagnosis not present

## 2016-09-24 DIAGNOSIS — Z9989 Dependence on other enabling machines and devices: Secondary | ICD-10-CM

## 2016-09-24 DIAGNOSIS — F329 Major depressive disorder, single episode, unspecified: Secondary | ICD-10-CM | POA: Diagnosis not present

## 2016-09-24 DIAGNOSIS — I890 Lymphedema, not elsewhere classified: Secondary | ICD-10-CM | POA: Insufficient documentation

## 2016-09-24 DIAGNOSIS — Z9851 Tubal ligation status: Secondary | ICD-10-CM | POA: Insufficient documentation

## 2016-09-24 DIAGNOSIS — Z79899 Other long term (current) drug therapy: Secondary | ICD-10-CM | POA: Insufficient documentation

## 2016-09-24 NOTE — Progress Notes (Signed)
Patient ID: Tracey White, female    DOB: 05/10/1980, 36 y.o.   MRN: [id number]  HPI  Ms Helmke is a 36 y/o female with a history of anxiety, obstructive sleep apnea (with nasal CPAP), scoliosis, recent tobacco use and chronic heart failure.   Reviewed last echo report done 10/19/15 which showed an EF of 65-70% along with trivial TR.   Was in the ED 08/05/16 with peripheral edema. Was treated with bumex with good response and discharged home. Was in the ED 07/17/16 due to low back pain along with peripheral edema. + bulging lumbar disc; Negative for DVT, IV diuretics given and she was released home. Was in the ED 07/04/16 due to chest pain & dizziness due to extra diuretic that patient took on her own at home. Treated and released.  Admitted 05/31/16 with chest pain and HF exacerbation. Initially treated with IV furosemide and transitioned to oral diuretics. Cardiology consult obtained. Discharged the following day. Was in the ED 05/11/16 with a persistent cough due to HF exacerbation. Was in the ED 12/25/15 with leg edema.  She presents today for a follow-up visit with a chief complaint of bilateral pedal edema. She describes this as chronic in nature having been present for several years with varying levels of severity. She has worn support socks in the past with minimal improvement. Does say that the edema improves after she's been in the bed but then worsens as the day progresses. She has associated fatigue, shortness of breath, cough and weight gain along with this. She denies any chest pain.  Past Medical History:  Diagnosis Date  . Anxiety   . Chronic heart failure (HCC)   . Morbid obesity (HCC)   . OSA (obstructive sleep apnea)    uses CPAP at home  . Peripheral edema   . Scoliosis    Past Surgical History:  Procedure Laterality Date  . HERNIA REPAIR    . scoliosis repair    . TUBAL LIGATION     Family History  Problem Relation Age of Onset  . Diabetes Mother   . Hypertension Mother     Social History  Substance Use Topics  . Smoking status: Former Smoker    Packs/day: 0.50    Types: Cigarettes    Quit date: 03/27/2016  . Smokeless tobacco: Never Used  . Alcohol use Yes     Comment: occasionally   Allergies  Allergen Reactions  . Aspirin Cough        Prior to Admission medications   Medication Sig Start Date End Date Taking? Authorizing Provider  albuterol (PROVENTIL HFA;VENTOLIN HFA) 108 (90 Base) MCG/ACT inhaler Inhale 2-4 puffs by mouth every 4 hours as needed for wheezing, cough, and/or shortness of breath 05/12/16  Yes Loleta Azaylah, MD  ALPRAZolam Prudy Feeler) 0.5 MG tablet Take 1 tablet by mouth daily as needed for anxiety.  11/14/15  Yes [provider]  beclomethasone (QVAR) 40 MCG/ACT inhaler Inhale 1 puff into the lungs 2 (two) times daily. 10/20/15  Yes Wieting, Richard, MD  benzonatate (TESSALON) 100 MG capsule Take 100 mg by mouth 2 (two) times daily as needed for cough.   Yes [provider]  bumetanide (BUMEX) 2 MG tablet Take 4 mg by mouth 2 (two) times daily.   Yes [provider]  escitalopram (LEXAPRO) 20 MG tablet Take 20 mg by mouth daily.    Yes [provider]  gabapentin (NEURONTIN) 100 MG capsule Take 100-200 mg by mouth 3 (three) times  daily.   Yes [provider]  losartan (COZAAR) 25 MG tablet Take 25 mg by mouth daily.   Yes [provider]  metoprolol succinate (TOPROL-XL) 25 MG 24 hr tablet Take 1 tablet by mouth daily. 10/30/15  Yes [provider]  montelukast (SINGULAIR) 10 MG tablet Take 10 mg by mouth at bedtime.   Yes [provider]  potassium chloride SA (K-DUR,KLOR-CON) 20 MEQ tablet Take 40 mEq by mouth 2 (two) times daily.   Yes [provider]    Review of Systems  Constitutional: Positive for fatigue (better with CPAP). Negative for appetite change.  HENT: Positive for congestion. Negative for postnasal drip and sore throat.   Eyes: Negative.    Respiratory: Positive for cough and shortness of breath. Negative for chest tightness and wheezing.   Cardiovascular: Negative for chest pain, palpitations and leg swelling.  Gastrointestinal: Positive for nausea. Negative for abdominal distention and abdominal pain.  Endocrine: Negative.   Genitourinary: Negative.   Musculoskeletal: Positive for arthralgias (knees) and back pain.  Skin: Negative.   Allergic/Immunologic: Negative.   Neurological: Negative for dizziness and light-headedness.  Hematological: Negative for adenopathy. Does not bruise/bleed easily.  Psychiatric/Behavioral: Positive for dysphoric mood. Negative for sleep disturbance (has resumed wearing CPAP) and suicidal ideas. The patient is not nervous/anxious.    Vitals:   09/24/16 0852  BP: 120/80  Pulse: (!) 117  Resp: 20  SpO2: 94%  Weight: (!) 357 lb 2 oz (162 kg)  Height: 5\' 1"  (1.549 m)   Wt Readings from Last 3 Encounters:  09/24/16 (!) 357 lb 2 oz (162 kg)  09/04/16 (!) 348 lb 6 oz (158 kg)  08/27/16 (!) 361 lb 6 oz (163.9 kg)    Lab Results  Component Value Date   CREATININE 0.80 09/04/2016   CREATININE 0.69 08/27/2016   CREATININE 0.72 08/13/2016    Physical Exam  Constitutional: She is oriented to person, place, and time. She appears well-developed and well-nourished.  HENT:  Head: Normocephalic and atraumatic.  Neck: Normal range of motion. Neck supple. No JVD present.  Cardiovascular: Normal rate and regular rhythm.   Pulmonary/Chest: Effort normal. She has no wheezes. She has no rhonchi. She has no rales.  Abdominal: Soft. She exhibits no distension. There is no tenderness.  Musculoskeletal: She exhibits edema (1+ pitting edema in bilaterl lower legs). She exhibits no tenderness.  Neurological: She is alert and oriented to person, place, and time.  Skin: Skin is warm and dry.  Psychiatric: She has a normal mood and affect. Her behavior is normal. Thought content normal.  Nursing note and  vitals reviewed.   Assessment & Plan:  1: Chronic heart failure with preserved ejection fraction- - NYHA class III - mildly fluid overloaded today - weight up 9 pounds from when she was last here. Reminded to call for an overnight weight gain of >2 pounds or a weekly weight gain of >5 pounds. 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 Each medication was verbally reviewed with the patient and she was encouraged to bring the bottles to every visit to confirm accuracy of list.  Return in 3 months or sooner for any questions/problems before then.

## 2016-09-24 NOTE — Patient Instructions (Signed)
Continue weighing daily and call for an overnight weight gain of > 2 pounds or a weekly weight gain of >5 pounds. 

## 2016-09-25 ENCOUNTER — Telehealth: Payer: Self-pay | Admitting: Family

## 2016-09-25 NOTE — Telephone Encounter (Signed)
Patient had called yesterday and left a message regarding some type of compression boots that a family member had and she was wondering if she could use those. LM on patient's voicemail that until she hears from [employer] it might be ok to use but without actually seeing the boots that she is talking about, I'm unable to say for sure.

## 2016-10-25 ENCOUNTER — Emergency Department: Payer: Medicaid Other

## 2016-10-25 DIAGNOSIS — Z5321 Procedure and treatment not carried out due to patient leaving prior to being seen by health care provider: Secondary | ICD-10-CM | POA: Diagnosis not present

## 2016-10-25 DIAGNOSIS — R0602 Shortness of breath: Secondary | ICD-10-CM | POA: Insufficient documentation

## 2016-10-25 LAB — COMPREHENSIVE METABOLIC PANEL
ALBUMIN: 4 g/dL (ref 3.5–5.0)
ALT: 11 U/L — AB (ref 14–54)
AST: 22 U/L (ref 15–41)
Alkaline Phosphatase: 110 U/L (ref 38–126)
Anion gap: 12 (ref 5–15)
BILIRUBIN TOTAL: 1 mg/dL (ref 0.3–1.2)
BUN: 10 mg/dL (ref 6–20)
CHLORIDE: 94 mmol/L — AB (ref 101–111)
CO2: 29 mmol/L (ref 22–32)
CREATININE: 0.73 mg/dL (ref 0.44–1.00)
Calcium: 9 mg/dL (ref 8.9–10.3)
GFR calc Af Amer: >60 mL/min (ref >60)
GLUCOSE: 137 mg/dL — AB (ref 65–99)
POTASSIUM: 3.2 mmol/L — AB (ref 3.5–5.1)
Sodium: 135 mmol/L (ref 135–145)
Total Protein: 8.4 g/dL — ABNORMAL HIGH (ref 6.5–8.1)

## 2016-10-25 LAB — CBC
HEMATOCRIT: 33.3 % — AB (ref 35.0–47.0)
HEMOGLOBIN: 10.5 g/dL — AB (ref 12.0–16.0)
MCH: 21.9 pg — AB (ref 26.0–34.0)
MCHC: 31.5 g/dL — AB (ref 32.0–36.0)
MCV: 69.6 fL — ABNORMAL LOW (ref 80.0–100.0)
Platelets: 287 10*3/uL (ref 150–440)
RBC: 4.79 MIL/uL (ref 3.80–5.20)
RDW: 18.4 % — ABNORMAL HIGH (ref 11.5–14.5)
WBC: 12 10*3/uL — ABNORMAL HIGH (ref 3.6–11.0)

## 2016-10-25 LAB — TROPONIN I

## 2016-10-25 IMAGING — CR DG CHEST 2V
2 series · 2 of 2 positions shown · non-contrast
Comparison: Chest radiograph [DATE]

CLINICAL DATA: RIGHT rib pain. Fall from chair yesterday. Shortness
of breath.

EXAM:
CHEST  2 VIEW

[chest pa]
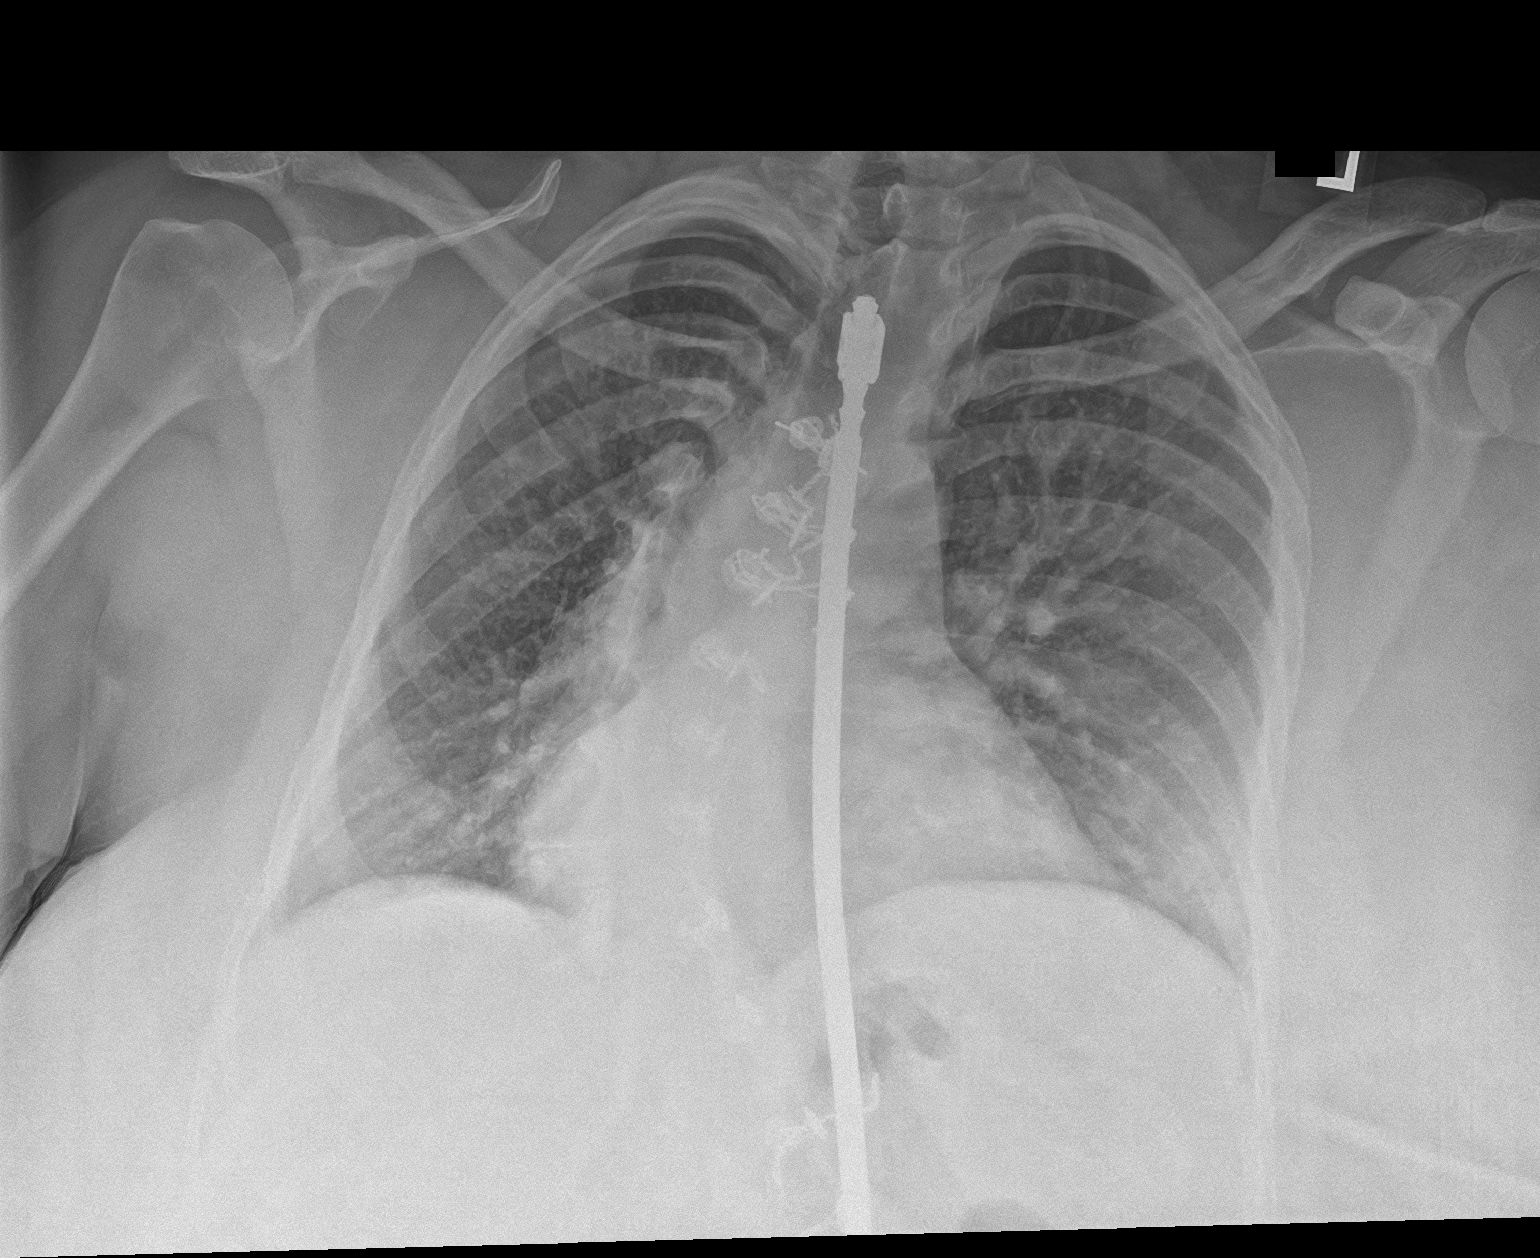

[chest lat]
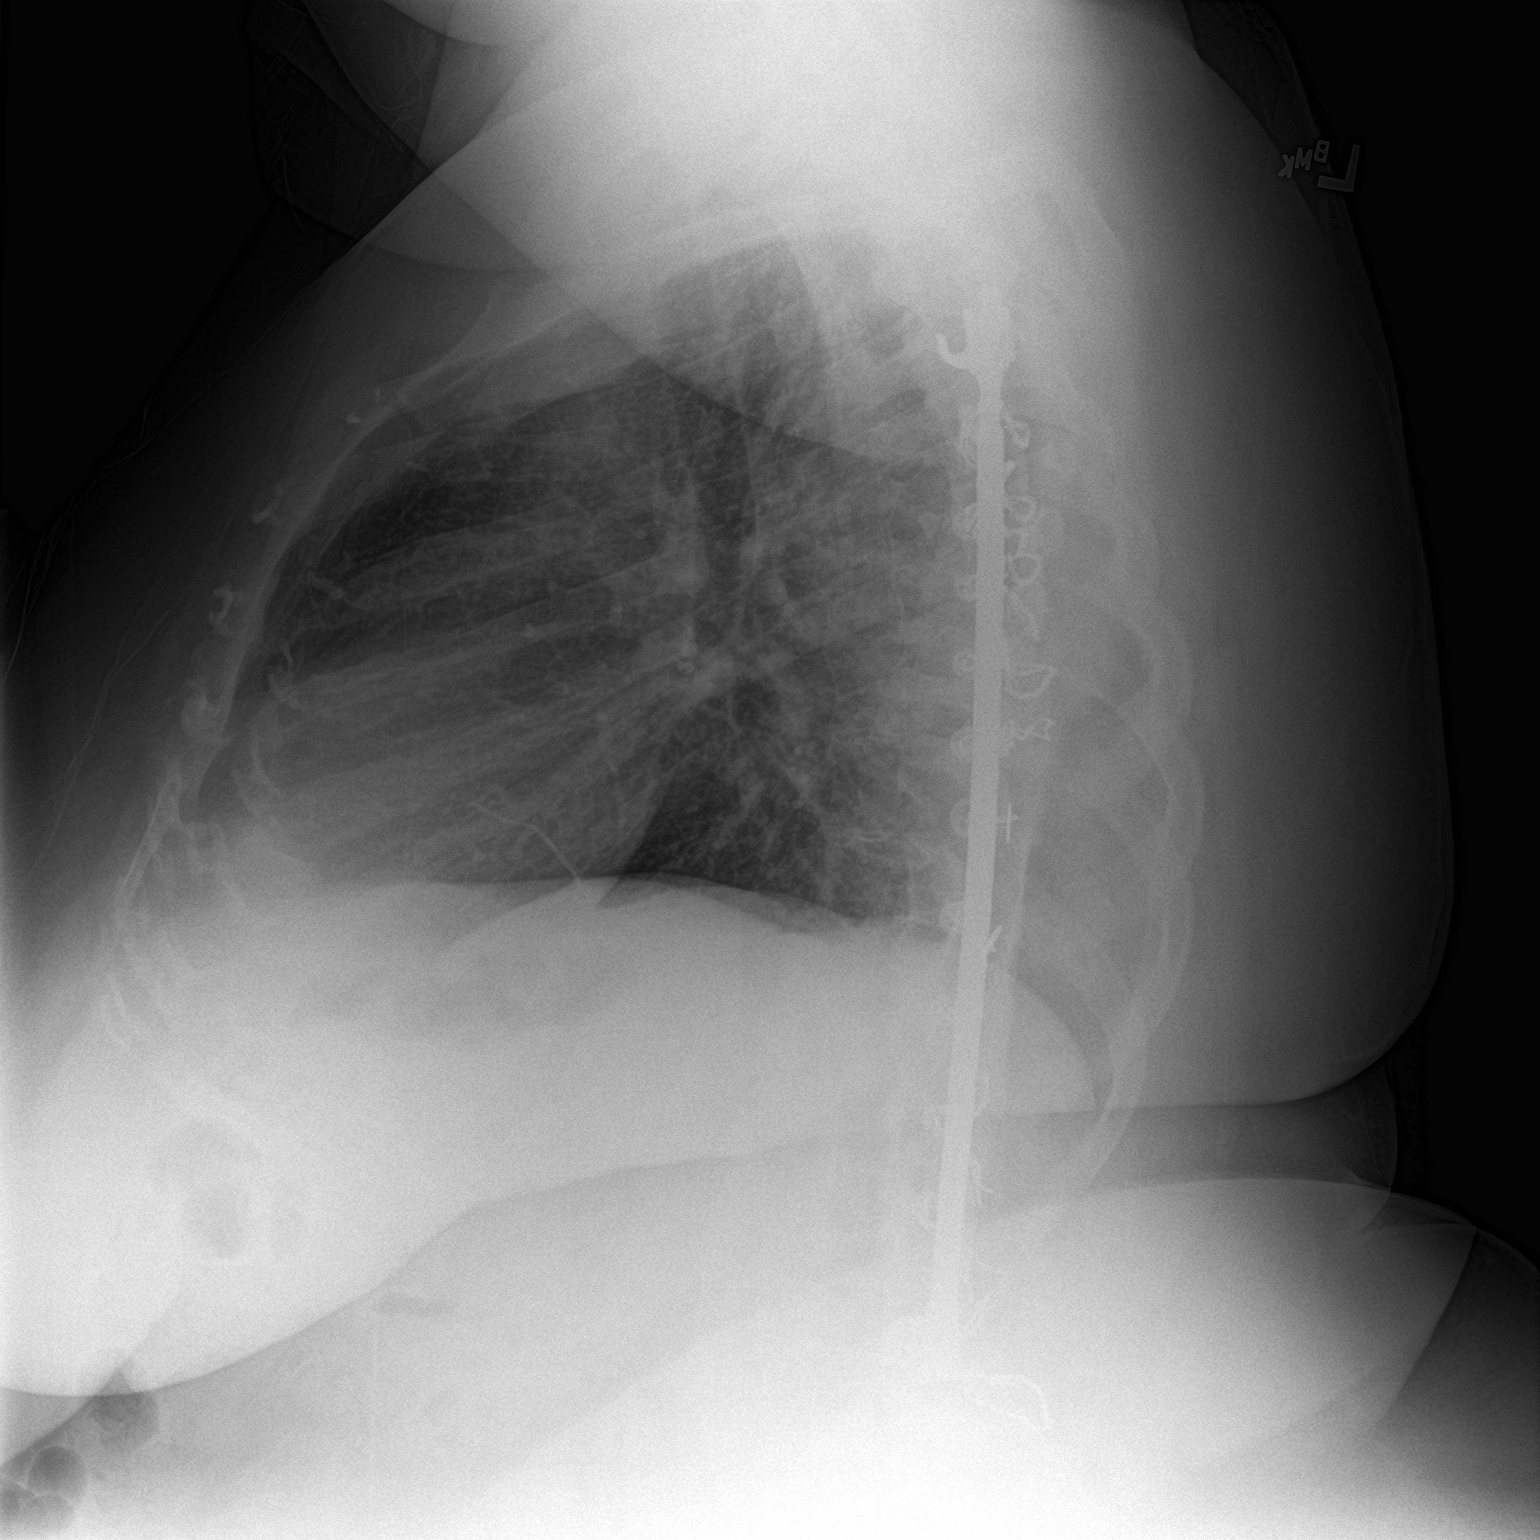

[2 of 2 positions shown; findings below may reference images not displayed]

FINDINGS: The cardiac silhouette is upper limits of normal in size. Pulmonary
vascular congestion without pleural effusion or focal consolidation.
No pneumothorax. Scoliosis and thoracolumbar hardware, incompletely
evaluated. Large body habitus.
IMPRESSION: Borderline cardiomegaly and pulmonary vascular congestion.

## 2016-10-25 NOTE — ED Triage Notes (Signed)
Pt fell off chair last night and is having pain to right rib area radiating down to right hip along with left rib area. Pt also co shob states has hx of chf, no shob noted at this time.

## 2016-10-26 ENCOUNTER — Emergency Department
Admission: EM | Admit: 2016-10-26 | Discharge: 2016-10-26 | Disposition: A | Discharge status: Home / Self Care | Payer: Medicaid Other | Attending: Emergency Medicine | Admitting: Emergency Medicine

## 2016-10-26 NOTE — ED Notes (Signed)
Called for pt to room, no answer.

## 2016-10-26 NOTE — ED Notes (Signed)
Call for pt to room, no answer.

## 2016-10-26 NOTE — ED Notes (Signed)
Pt called to room, no answer  

## 2016-12-05 ENCOUNTER — Other Ambulatory Visit: Payer: Self-pay

## 2016-12-05 ENCOUNTER — Encounter: Payer: Self-pay | Admitting: Emergency Medicine

## 2016-12-05 ENCOUNTER — Emergency Department: Payer: Medicaid Other

## 2016-12-05 ENCOUNTER — Emergency Department
Admission: EM | Admit: 2016-12-05 | Discharge: 2016-12-05 | Disposition: A | Discharge status: Home / Self Care | Payer: Medicaid Other | Attending: Emergency Medicine | Admitting: Emergency Medicine

## 2016-12-05 DIAGNOSIS — Z87891 Personal history of nicotine dependence: Secondary | ICD-10-CM | POA: Insufficient documentation

## 2016-12-05 DIAGNOSIS — M545 Low back pain: Secondary | ICD-10-CM | POA: Insufficient documentation

## 2016-12-05 DIAGNOSIS — I509 Heart failure, unspecified: Secondary | ICD-10-CM

## 2016-12-05 DIAGNOSIS — Z79899 Other long term (current) drug therapy: Secondary | ICD-10-CM | POA: Insufficient documentation

## 2016-12-05 DIAGNOSIS — I5032 Chronic diastolic (congestive) heart failure: Secondary | ICD-10-CM | POA: Diagnosis not present

## 2016-12-05 DIAGNOSIS — G8929 Other chronic pain: Secondary | ICD-10-CM | POA: Diagnosis not present

## 2016-12-05 LAB — TROPONIN I

## 2016-12-05 LAB — CBC
HCT: 33.2 % — ABNORMAL LOW (ref 35.0–47.0)
Hemoglobin: 10.1 g/dL — ABNORMAL LOW (ref 12.0–16.0)
MCH: 21.6 pg — ABNORMAL LOW (ref 26.0–34.0)
MCHC: 30.4 g/dL — ABNORMAL LOW (ref 32.0–36.0)
MCV: 71.1 fL — AB (ref 80.0–100.0)
PLATELETS: 304 10*3/uL (ref 150–440)
RBC: 4.67 MIL/uL (ref 3.80–5.20)
RDW: 19.5 % — ABNORMAL HIGH (ref 11.5–14.5)
WBC: 10.1 10*3/uL (ref 3.6–11.0)

## 2016-12-05 LAB — BASIC METABOLIC PANEL
ANION GAP: 12 (ref 5–15)
BUN: 9 mg/dL (ref 6–20)
CO2: 28 mmol/L (ref 22–32)
Calcium: 8.6 mg/dL — ABNORMAL LOW (ref 8.9–10.3)
Chloride: 96 mmol/L — ABNORMAL LOW (ref 101–111)
Creatinine, Ser: 0.69 mg/dL (ref 0.44–1.00)
GFR calc Af Amer: >60 mL/min (ref >60)
GLUCOSE: 89 mg/dL (ref 65–99)
POTASSIUM: 3.3 mmol/L — AB (ref 3.5–5.1)
Sodium: 136 mmol/L (ref 135–145)

## 2016-12-05 IMAGING — CR DG CHEST 2V
1 series · 2 of 2 positions shown · non-contrast
Comparison: Chest x-ray of [DATE]

CLINICAL DATA: Shortness of breath and right-sided chest pain and
cough.

EXAM:
CHEST  2 VIEW

[Series 1: w chest pa · 0.14mm/px · 2 of 2 slices shown]
[im 1/2]
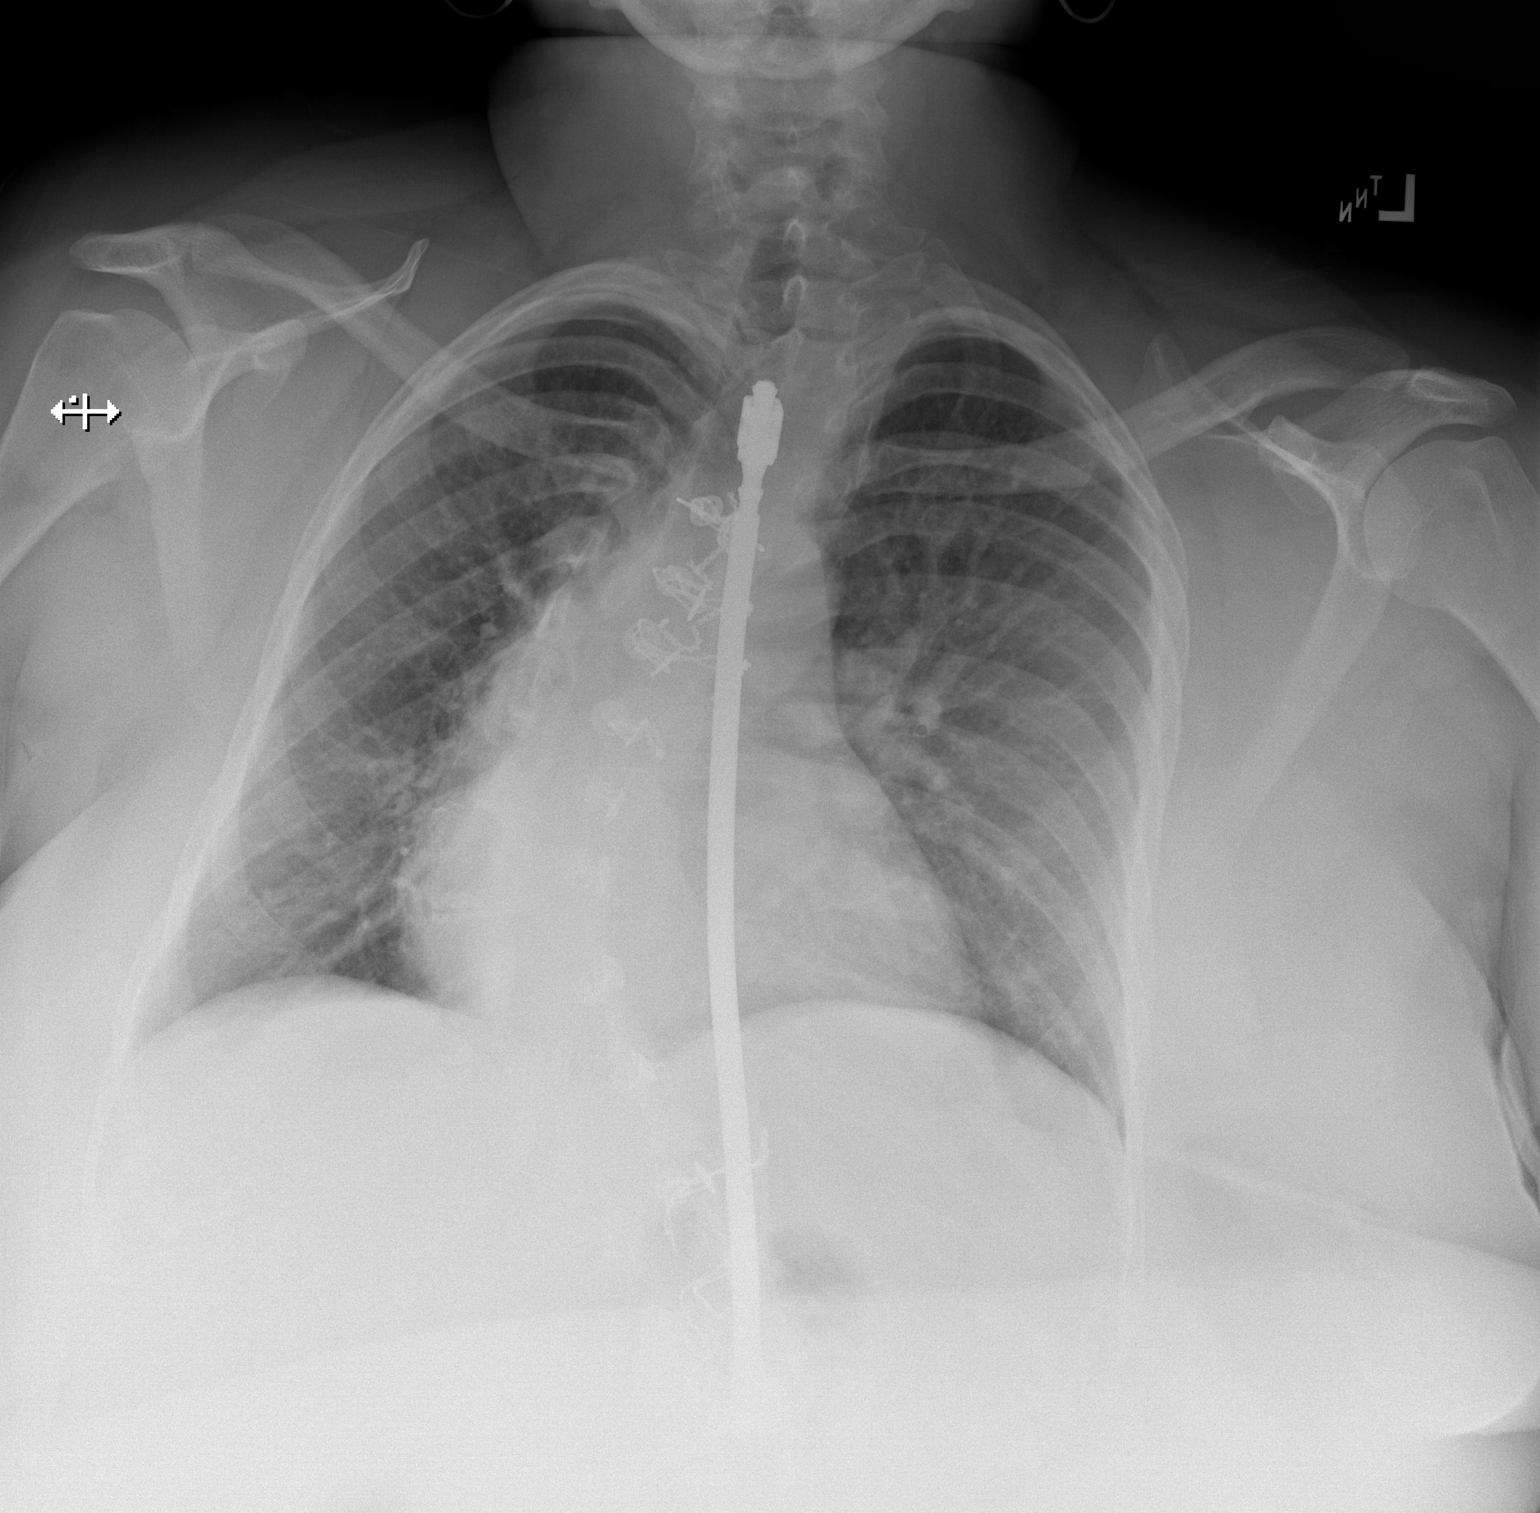
[im 2/2]
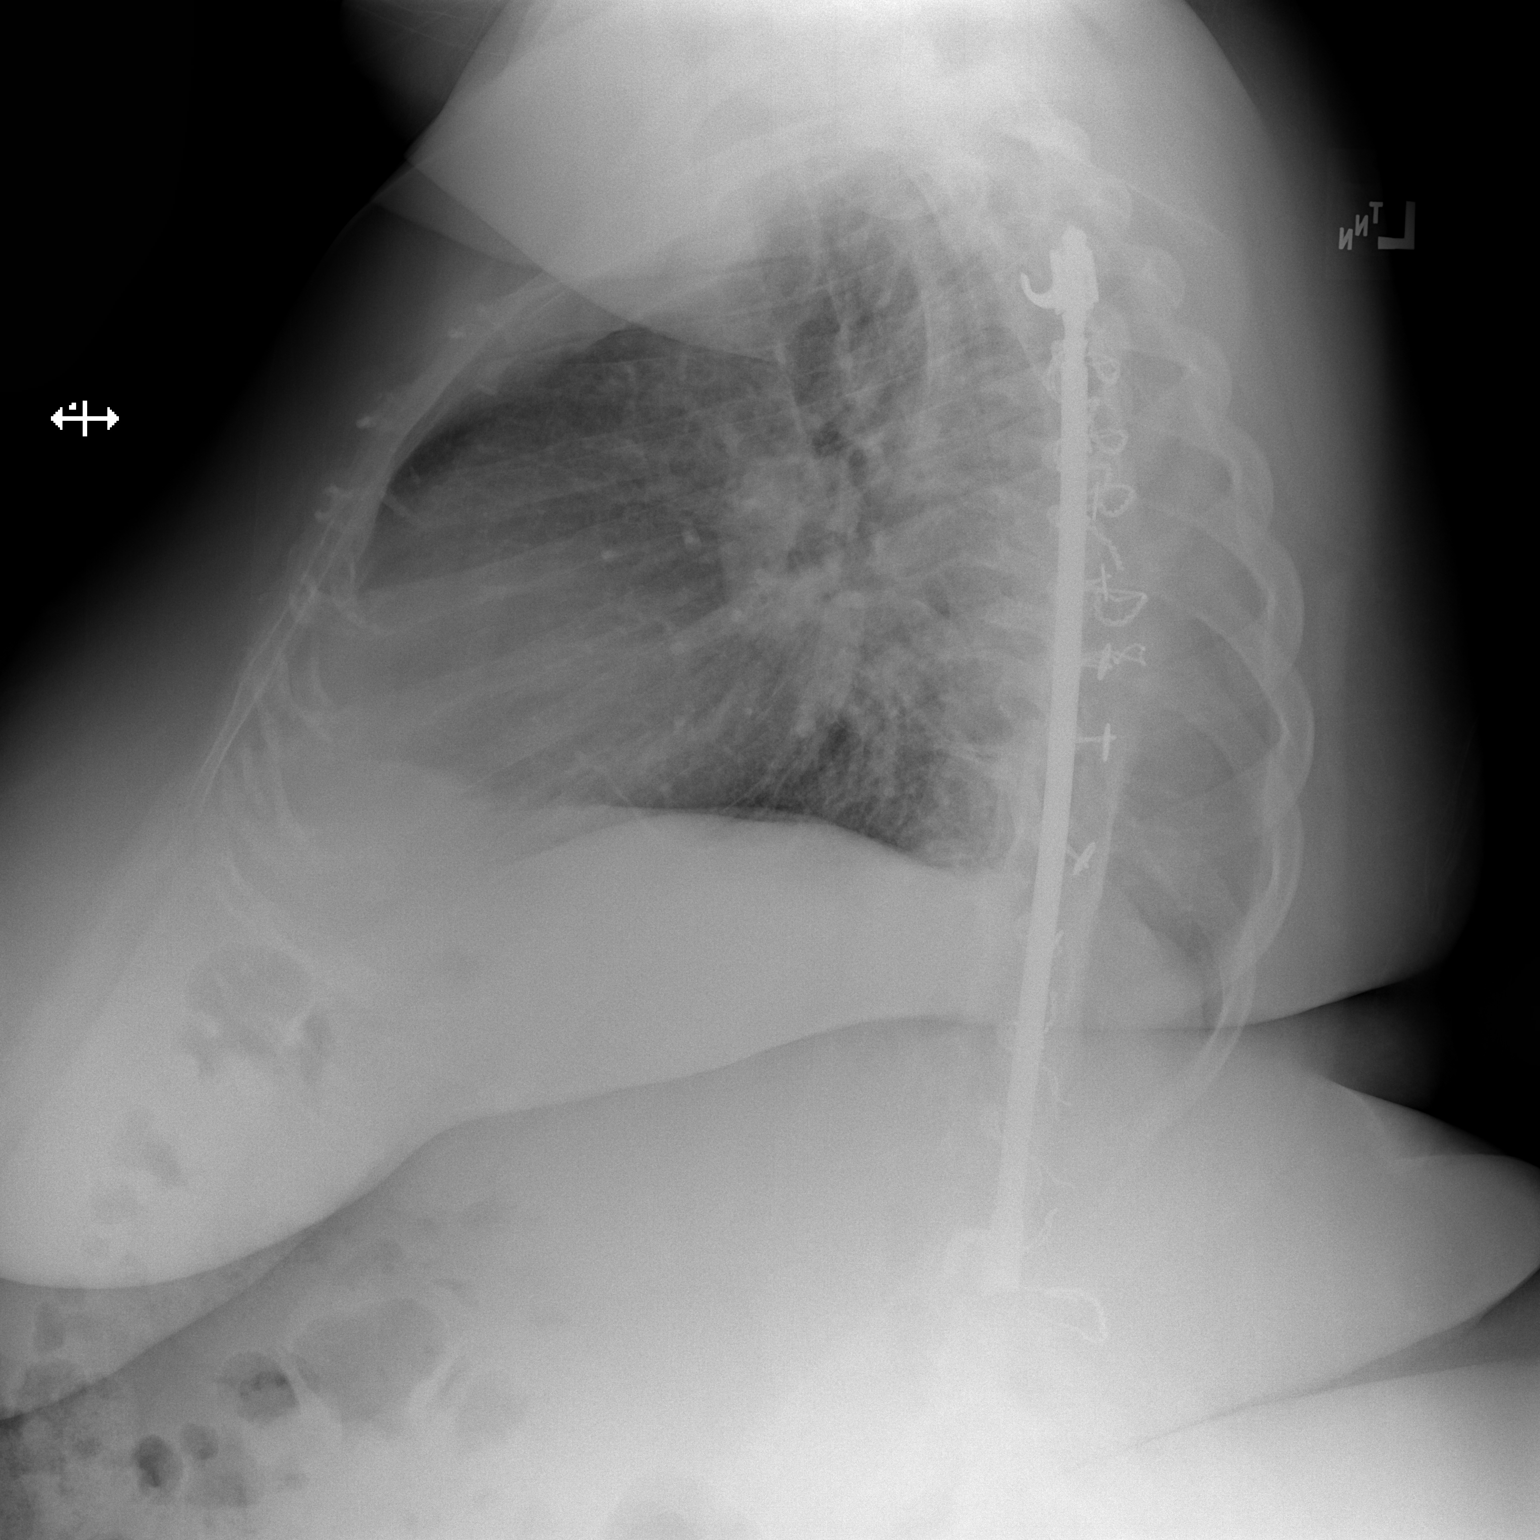

[2 of 2 positions shown; findings below may reference images not displayed]

FINDINGS: The lungs are well-expanded. The interstitial markings are coarse
though stable. The cardiac silhouette is enlarged. The pulmonary
vascularity is engorged. The patient has undergone Harrington rod
placement and posterior fusion procedures throughout the thoracic
and upper lumbar spine for scoliosis.
IMPRESSION: Chronic low-grade CHF with mild acute exacerbation. Underlying
chronic bronchitic change. No acute pneumonia.

## 2016-12-05 MED ORDER — HYDROCODONE-ACETAMINOPHEN 5-325 MG PO TABS
1.0000 | ORAL_TABLET | Freq: Once | ORAL | Status: AC
  Administered 2016-12-05: 1 via ORAL
  Filled 2016-12-05: qty 1

## 2016-12-05 MED ORDER — ACETAMINOPHEN 325 MG PO TABS
650.0000 mg | ORAL_TABLET | Freq: Once | ORAL | Status: AC
  Administered 2016-12-05: 650 mg via ORAL
  Filled 2016-12-05: qty 2

## 2016-12-05 NOTE — ED Triage Notes (Signed)
Pt presents with low back and knee pain, also states has had a cough with shortness of breath.;

## 2016-12-05 NOTE — ED Provider Notes (Signed)
Patient received in sign-out from Dr. Lenard LancePAduchowski.  Workup and evaluation pending blood work.  Troponin is negative.  Patient otherwise stable and appropriate for further workup and follow-up as an outpatient.Tracey White.      Keon Pender, MD 12/05/16 [phone]

## 2016-12-05 NOTE — ED Provider Notes (Signed)
[facility] Emergency Department Provider Note  Time seen: 2:12 PM  I have reviewed the triage vital signs and the nursing notes.   HISTORY  Chief Complaint Back Pain and Shortness of Breath    HPI Tracey White is a 36 y.o. female with a past medical history of anxiety, CHF, obesity, presents to the emergency department for back pain, bilateral knee pain and cough with white sputum production.  According to the patient for the past several days she has been expensing worsening back pain and bilateral knee pain.  Patient states a history of chronic back pain with nerve damage, takes gabapentin and Tylenol at home for discomfort.  She states today she is also been coughing with white sputum production.  Denies any shortness of breath denies any chest pain but states she has been feeling a intermittent chest fluttering sensation over the past several days.  Does state increased lower extremity edema over the past few days.  Past Medical History:  Diagnosis Date  . Anxiety   . Chronic heart failure (HCC)   . Morbid obesity (HCC)   . OSA (obstructive sleep apnea)    uses CPAP at home  . Peripheral edema   . Scoliosis     Patient Active Problem List   Diagnosis Date Noted  . Lymphedema 09/24/2016  . Skin lesion of left leg 09/05/2016  . Depression 08/27/2016  . Chronic low back pain 08/27/2016  . Hypokalemia 06/06/2016  . Chest pain 05/31/2016  . Chronic diastolic heart failure (HCC) 05/15/2016  . Tachycardia 05/15/2016  . Obstructive sleep apnea on CPAP 05/15/2016  . Tobacco use 05/15/2016  . Chest pain at rest 10/18/2015    Past Surgical History:  Procedure Laterality Date  . HERNIA REPAIR    . scoliosis repair    . TUBAL LIGATION      Prior to Admission medications   Medication Sig Start Date End Date Taking? Authorizing Provider  albuterol (PROVENTIL HFA;VENTOLIN HFA) 108 (90 Base) MCG/ACT inhaler Inhale 2-4 puffs by mouth every 4 hours as  needed for wheezing, cough, and/or shortness of breath 05/12/16   Loleta RoseForbach, Cory, MD  ALPRAZolam Prudy Feeler(XANAX) 0.5 MG tablet Take 1 tablet by mouth daily as needed for anxiety.  11/14/15   [provider]  beclomethasone (QVAR) 40 MCG/ACT inhaler Inhale 1 puff into the lungs 2 (two) times daily. 10/20/15   Alford HighlandWieting, Richard, MD  benzonatate (TESSALON) 100 MG capsule Take 100 mg by mouth 2 (two) times daily as needed for cough.    [provider]  bumetanide (BUMEX) 2 MG tablet Take 4 mg by mouth 2 (two) times daily.    [provider]  escitalopram (LEXAPRO) 20 MG tablet Take 20 mg by mouth daily.     [provider]  gabapentin (NEURONTIN) 100 MG capsule Take 100-200 mg by mouth 3 (three) times daily.    [provider]  losartan (COZAAR) 25 MG tablet Take 25 mg by mouth daily.    [provider]  metoprolol succinate (TOPROL-XL) 25 MG 24 hr tablet Take 1 tablet by mouth daily. 10/30/15   [provider]  montelukast (SINGULAIR) 10 MG tablet Take 10 mg by mouth at bedtime.    [provider]  potassium chloride SA (K-DUR,KLOR-CON) 20 MEQ tablet Take 40 mEq by mouth 2 (two) times daily.    [provider]    Allergies  Allergen Reactions  . Aspirin Cough  Family History  Problem Relation Age of Onset  . Diabetes Mother   . Hypertension Mother     Social History Social History   Tobacco Use  . Smoking status: Former Smoker    Packs/day: 0.50    Types: Cigarettes    Last attempt to quit: 03/27/2016    Years since quitting: 0.6  . Smokeless tobacco: Never Used  Substance Use Topics  . Alcohol use: Yes    Comment: occasionally  . Drug use: No    Review of Systems Constitutional: Negative for fever Cardiovascular: Negative for chest pain.  Positive for palpitations. Respiratory: Negative for shortness of breath, positive for cough with occasional white sputum production. Gastrointestinal: Negative for  abdominal pain Musculoskeletal: Positive for lower back pain.  Positive for bilateral knee pain. Skin: Negative for rash. Neurological: Negative for headache All other ROS negative  ____________________________________________   PHYSICAL EXAM:  VITAL SIGNS: ED Triage Vitals  Enc Vitals Group     BP 12/05/16 1055 115/74     Pulse Rate 12/05/16 1055 94     Resp 12/05/16 1055 20     Temp 12/05/16 1055 98.3 F (36.8 C)     Temp Source 12/05/16 1055 Oral     SpO2 12/05/16 1055 96 %     Weight 12/05/16 1056 (!) 352 lb (159.7 kg)     Height --      Head Circumference --      Peak Flow --      Pain Score 12/05/16 1055 10     Pain Loc --      Pain Edu? --      Excl. in GC? --     Constitutional: Alert and oriented. Well appearing and in no distress.  Ambulated to bed without difficulty. Eyes: Normal exam ENT   Head: Normocephalic and atraumatic.   Mouth/Throat: Mucous membranes are moist. Cardiovascular: Normal rate, regular rhythm. No murmur Respiratory: Normal respiratory effort without tachypnea nor retractions.  Slight expiratory wheeze.  No rales or rhonchi. Gastrointestinal: Soft and nontender. No distention.  Musculoskeletal: Nontender with normal range of motion in all extremities.  Moderate lower extremity edema, equal bilaterally, no tenderness to palpation. Neurologic:  Normal speech and language. No gross focal neurologic deficits Skin:  Skin is warm, dry and intact.  Psychiatric: Mood and affect are normal.  ____________________________________________    EKG  EKG reviewed and interpreted by myself shows sinus rhythm at 96 bpm with a narrow QRS, normal axis, normal intervals, no concerning ST changes.  Overall reassuring EKG.  ____________________________________________    RADIOLOGY  X-ray shows low-grade CHF with mild exacerbation.  ____________________________________________   INITIAL IMPRESSION / ASSESSMENT AND PLAN / ED COURSE  Pertinent  labs & imaging results that were available during my care of the patient were reviewed by me and considered in my medical decision making (see chart for details).  Patient presents to the emergency department for back pain, knee pain cough with sputum production.  Differential would include CHF exacerbation, pneumonia, upper respiratory infection, musculoskeletal pain.  Overall the patient appears very well, 96% room air saturation.  Ambulates without difficulty.  Patient's chest x-ray does show mild acute exacerbation of underlying CHF.  We will check labs including cardiac enzymes.  EKG is reassuring.  Patient's back and knee pain is most consistent musculoskeletal pain, denies any trauma.  No concerning findings.  No neurological deficits.  We will dose 1 time pain medication in the emergency department.  Patient takes gabapentin and  Tylenol at home for her discomfort.  If the patient's cardiac workup is negative we will increase the patient's home Bumex, and have her follow-up with her primary care doctor.  Troponin was negative and the patient discharged home.  ____________________________________________   FINAL CLINICAL IMPRESSION(S) / ED DIAGNOSES  CHF exacerbation Musculoskeletal pain    Minna AntisPaduchowski, Kassadi Presswood, MD 12/06/16 1507

## 2016-12-05 NOTE — Discharge Instructions (Addendum)
As we discussed please increase your Bumex to 2 tablets in the morning and 1 tablet in the afternoon, for the next 5 days, then return to your normal dosing.  Please follow-up with your doctor in the next 2-3 days for recheck/reevaluation.  Return to the emergency department for any chest pain, trouble breathing, or any other symptom personally concerning to yourself.

## 2016-12-12 ENCOUNTER — Encounter: Payer: Self-pay | Admitting: Nurse Practitioner

## 2016-12-12 ENCOUNTER — Ambulatory Visit: Payer: Medicaid Other | Attending: Nurse Practitioner | Admitting: Nurse Practitioner

## 2016-12-12 ENCOUNTER — Other Ambulatory Visit: Payer: Self-pay

## 2016-12-12 VITALS — BP 128/62 | HR 89 | Temp 98.2°F | Resp 20 | Ht 61.0 in | Wt 352.0 lb

## 2016-12-12 DIAGNOSIS — G8929 Other chronic pain: Secondary | ICD-10-CM | POA: Insufficient documentation

## 2016-12-12 DIAGNOSIS — E876 Hypokalemia: Secondary | ICD-10-CM | POA: Diagnosis not present

## 2016-12-12 DIAGNOSIS — K219 Gastro-esophageal reflux disease without esophagitis: Secondary | ICD-10-CM | POA: Insufficient documentation

## 2016-12-12 DIAGNOSIS — G894 Chronic pain syndrome: Secondary | ICD-10-CM | POA: Diagnosis present

## 2016-12-12 DIAGNOSIS — M5441 Lumbago with sciatica, right side: Secondary | ICD-10-CM | POA: Diagnosis not present

## 2016-12-12 DIAGNOSIS — I5032 Chronic diastolic (congestive) heart failure: Secondary | ICD-10-CM | POA: Insufficient documentation

## 2016-12-12 DIAGNOSIS — Z79899 Other long term (current) drug therapy: Secondary | ICD-10-CM | POA: Insufficient documentation

## 2016-12-12 DIAGNOSIS — F329 Major depressive disorder, single episode, unspecified: Secondary | ICD-10-CM | POA: Insufficient documentation

## 2016-12-12 DIAGNOSIS — M41115 Juvenile idiopathic scoliosis, thoracolumbar region: Secondary | ICD-10-CM | POA: Insufficient documentation

## 2016-12-12 DIAGNOSIS — M899 Disorder of bone, unspecified: Secondary | ICD-10-CM | POA: Diagnosis not present

## 2016-12-12 DIAGNOSIS — Z789 Other specified health status: Secondary | ICD-10-CM | POA: Diagnosis not present

## 2016-12-12 DIAGNOSIS — G4733 Obstructive sleep apnea (adult) (pediatric): Secondary | ICD-10-CM | POA: Insufficient documentation

## 2016-12-12 DIAGNOSIS — M419 Scoliosis, unspecified: Secondary | ICD-10-CM | POA: Insufficient documentation

## 2016-12-12 DIAGNOSIS — Z9889 Other specified postprocedural states: Secondary | ICD-10-CM | POA: Diagnosis not present

## 2016-12-12 DIAGNOSIS — M25562 Pain in left knee: Secondary | ICD-10-CM | POA: Diagnosis not present

## 2016-12-12 DIAGNOSIS — M546 Pain in thoracic spine: Secondary | ICD-10-CM | POA: Insufficient documentation

## 2016-12-12 DIAGNOSIS — Z720 Tobacco use: Secondary | ICD-10-CM | POA: Insufficient documentation

## 2016-12-12 DIAGNOSIS — M25561 Pain in right knee: Secondary | ICD-10-CM | POA: Insufficient documentation

## 2016-12-12 DIAGNOSIS — F419 Anxiety disorder, unspecified: Secondary | ICD-10-CM | POA: Diagnosis not present

## 2016-12-12 DIAGNOSIS — Z833 Family history of diabetes mellitus: Secondary | ICD-10-CM | POA: Diagnosis not present

## 2016-12-12 DIAGNOSIS — Z8249 Family history of ischemic heart disease and other diseases of the circulatory system: Secondary | ICD-10-CM | POA: Diagnosis not present

## 2016-12-12 NOTE — Patient Instructions (Signed)

## 2016-12-12 NOTE — Progress Notes (Signed)
Patient's Name: Tracey White  MRN: [id number]  Referring Provider: Derinda Late, MD  DOB: [birth year]  PCP: Derinda Late, MD  DOS: 12/12/2016  Note by: Dionisio David NP  Service setting: Ambulatory outpatient  Specialty: Interventional Pain Management  Location: [facility] (AMB) Pain Management Facility    Patient type: New Patient    Primary Reason(s) for Visit: Initial Patient Evaluation CC: Knee Pain (bilateral) and Back Pain (lower)  HPI  Tracey White is a 36 y.o. year old, female patient, who comes today for an initial evaluation. She has Chest pain at rest; Chronic diastolic heart failure ([place]); Tachycardia; Obstructive sleep apnea on CPAP; Tobacco use; Chest pain; Hypokalemia; Depression; Chronic low back pain (Primary Area of Pain) (R); Skin lesion of left leg; Lymphedema; Knee pain, chronic (Secondary Area of Pain) (B) (R>L); Thoracic back pain (Fourth Area of Pain) (B) (R>L); Chronic pain syndrome; Disorder of bone, unspecified; Other long term (current) drug therapy; Other specified health status; and Scoliosis on their problem list.. Her primarily concern today is the Knee Pain (bilateral) and Back Pain (lower)  Pain Assessment: Location: Right Knee Radiating:   Onset: More than a month ago Duration:   Quality: Aching, Throbbing, Shooting Severity: 10-Worst pain ever/10 (self-reported pain score)  Note: Reported level is compatible with observation. Clinically the patient looks like a 4/10       Information on the proper use of the pain scale provided to the patient today. When using our objective Pain Scale, levels between 6 and 10/10 are said to belong in an emergency room, as it progressively worsens from a 6/10, described as severely limiting, requiring emergency care not usually available at an outpatient pain management facility. At a 6/10 level, communication becomes difficult and requires great effort. Assistance to reach the emergency department may be required. Facial flushing  and profuse sweating along with potentially dangerous increases in heart rate and blood pressure will be evident. Effect on ADL:   Timing: Constant Modifying factors: Ibuprofen, Tylenol Arthritis, Gabapentin, Goody Powder Arthritis  Onset and Duration: Present longer than 3 months Cause of pain: Unknown Severity: Getting worse, NAS-11 at its worse: 10/10, NAS-11 at its best: 7/10, NAS-11 now: 10/10 and NAS-11 on the average: 10/10 Timing: Not influenced by the time of the day Aggravating Factors: Bending, Climbing, Kneeling, Lifiting, Motion, Nerve blocks, Prolonged sitting, Prolonged standing, Squatting and Stooping  Alleviating Factors: Cold packs, Hot packs, Lying down, Medications, Sleeping and Warm showers or baths Associated Problems: Depression, Numbness, Spasms, Swelling, Pain that wakes patient up and Pain that does not allow patient to sleep Quality of Pain: Aching, Annoying, Burning, Constant, Feeling of weight, Hot, Pulsating, Tender and Throbbing Previous Examinations or Tests: CT scan and MRI scan Previous Treatments: The patient denies any previous treatments  The patient comes into the clinics today for the first time for a chronic pain management evaluation. According to the patient her primary area of pain is in her lower back. She admits that the left side is greater than the right. She admits that she has shooting pain down into her knees. She has scoliosis she status post spinal fusion in1990 and 1992. She denies any interventional therapy. She did have 2 sessions of physical therapy. She does not feel like it was effective however transportation was the main issue. She denies any recent images.  Her second area of pain is in her knees. She admits that the right is greater than the left. She admits the pain is mostly at the  top of her knee. She denies any interventional therapy,  physical therapy or recent images.  Her third area of pain is in her lower extremities. She  admits that she has some numbness tingling in her legs and feet.  She just completed her first visit for weight loss surgery.  Today I took the time to provide the patient with information regarding this pain practice. The patient was informed that the practice is divided into two sections: an interventional pain management section, as well as a completely separate and distinct medication management section. I explained that there are procedure days for interventional therapies, and evaluation days for follow-ups and medication management. Because of the amount of documentation required during both, they are kept separated. This means that there is the possibility that she may be scheduled for a procedure on one day, and medication management the next. I have also informed her that because of staffing and facility limitations, this practice will no longer take patients for medication management only. To illustrate the reasons for this, I gave the patient the example of surgeons, and how inappropriate it would be to refer a patient to his/her care, just to write for the post-surgical antibiotics on a surgery done by a different surgeon.   Because interventional pain management is part of the board-certified specialty for the doctors, the patient was informed that joining this practice means that they are open to any and all interventional therapies. I made it clear that this does not mean that they will be forced to have any procedures done. What this means is that I believe interventional therapies to be essential part of the diagnosis and proper management of chronic pain conditions. Therefore, patients not interested in these interventional alternatives will be better served under the care of a different practitioner.  The patient was also made aware of my Comprehensive Pain Management Safety Guidelines where by joining this practice, they limit all of their nerve blocks and joint injections to those done  by our practice, for as long as we are retained to manage their care. Historic Controlled Substance Pharmacotherapy Review  PMP and historical list of controlled substances: Alprazolam 0.5, tramadol 50 mg, codeine and guaifenesin syrup, hydrocodone, atropine syrup, hydrocodone/acetaminophen 5/325 mg, Highest opioid analgesic regimen found: Hydrocodone/acetaminophen 5/325 mg 1 tablet 5 times daily (fill date 07/17/2015) hydrocodone 25 mg per day Most recent opioid analgesic: None Current opioid analgesics: None Highest recorded MME/day: 25 mg/day MME/day: 0 mg/day Medications: The patient did not bring the medication(s) to the appointment, as requested in our "New Patient Package" Pharmacodynamics: Desired effects: Analgesia: The patient reports >50% benefit. Reported improvement in function: The patient reports medication allows her to accomplish basic ADLs. Clinically meaningful improvement in function (CMIF): Sustained CMIF goals met Perceived effectiveness: Described as relatively effective, allowing for increase in activities of daily living (ADL) Undesirable effects: Side-effects or Adverse reactions: None reported Historical Monitoring: The patient  reports that she does not use drugs. List of all UDS Test(s): No results found for: MDMA, COCAINSCRNUR, PCPSCRNUR, PCPQUANT, CANNABQUANT, THCU, [place] List of all Serum Drug Screening Test(s):  No results found for: AMPHSCRSER, BARBSCRSER, BENZOSCRSER, COCAINSCRSER, PCPSCRSER, PCPQUANT, THCSCRSER, CANNABQUANT, OPIATESCRSER, OXYSCRSER, PROPOXSCRSER Historical Background Evaluation: [place] PDMP: Six (6) year initial data search conducted.             [place] Department of public safety, offender search: Editor, commissioning Information) Non-contributory Risk Assessment Profile: Aberrant behavior: None observed or detected today Risk factors for fatal opioid overdose: None identified today  Fatal overdose hazard ratio (HR): Calculation deferred Non-fatal overdose  hazard ratio (HR): Calculation deferred Risk of opioid abuse or dependence: 0.7-3.0% with doses ? 36 MME/day and 6.1-26% with doses ? 120 MME/day. Substance use disorder (SUD) risk level: Pending results of Medical Psychology Evaluation for SUD Opioid risk tool (ORT) (Total Score): 5  ORT Scoring interpretation table:  Score <3 = Low Risk for SUD  Score between 4-7 = Moderate Risk for SUD  Score >8 = High Risk for Opioid Abuse   PHQ-2 Depression Scale:  Total score: 0  PHQ-2 Scoring interpretation table: (Score and probability of major depressive disorder)  Score 0 = No depression  Score 1 = 15.4% Probability  Score 2 = 21.1% Probability  Score 3 = 38.4% Probability  Score 4 = 45.5% Probability  Score 5 = 56.4% Probability  Score 6 = 78.6% Probability   PHQ-9 Depression Scale:  Total score: 0  PHQ-9 Scoring interpretation table:  Score 0-4 = No depression  Score 5-9 = Mild depression  Score 10-14 = Moderate depression  Score 15-19 = Moderately severe depression  Score 20-27 = Severe depression (2.4 times higher risk of SUD and 2.89 times higher risk of overuse)   Pharmacologic Plan: Pending ordered tests and/or consults  Meds  The patient has a current medication list which includes the following prescription(s): albuterol, alprazolam, aripiprazole, beclomethasone, bumetanide, diclofenac sodium, diltiazem, escitalopram, gabapentin, lidocaine, losartan, metoprolol succinate, montelukast, and potassium chloride sa, and the following Facility-Administered Medications: technetium tetrofosmin.  Current Outpatient Medications on File Prior to Visit  Medication Sig  . albuterol (PROVENTIL HFA;VENTOLIN HFA) 108 (90 Base) MCG/ACT inhaler Inhale 2-4 puffs by mouth every 4 hours as needed for wheezing, cough, and/or shortness of breath  . ALPRAZolam (XANAX) 0.5 MG tablet Take 1 tablet by mouth daily as needed for anxiety.   . ARIPiprazole (ABILIFY) 2 MG tablet Take 2 mg daily by mouth.   . beclomethasone (QVAR) 40 MCG/ACT inhaler Inhale 1 puff into the lungs 2 (two) times daily.  . bumetanide (BUMEX) 2 MG tablet Take 4 mg by mouth 2 (two) times daily.  . diclofenac sodium (VOLTAREN) 1 % GEL Apply 4 (four) times daily topically.  Marland Kitchen diltiazem (CARDIZEM) 120 MG tablet Take 120 mg daily by mouth.  . escitalopram (LEXAPRO) 20 MG tablet Take 20 mg by mouth daily.   Marland Kitchen gabapentin (NEURONTIN) 100 MG capsule Take 100-200 mg by mouth 3 (three) times daily.  Marland Kitchen lidocaine (LIDODERM) 5 % Place 1 patch daily onto the skin. Remove & Discard patch within 12 hours or as directed by MD  . losartan (COZAAR) 25 MG tablet Take 25 mg by mouth daily.  . metoprolol succinate (TOPROL-XL) 25 MG 24 hr tablet Take 2 tablets daily by mouth.   . montelukast (SINGULAIR) 10 MG tablet Take 10 mg by mouth at bedtime.  . potassium chloride SA (K-DUR,KLOR-CON) 20 MEQ tablet Take 40 mEq by mouth 2 (two) times daily.   Current Facility-Administered Medications on File Prior to Visit  Medication  . technetium tetrofosmin (TC-MYOVIEW) injection 70.62 millicurie   Imaging Review    Note: No new results found.        ROS  Cardiovascular History: Heart trouble, High blood pressure, Chest pain and Heart failure Pulmonary or Respiratory History: Shortness of breath, Smoking, Snoring  and Temporary stoppage of breathing during sleep Neurological History: Curved spine Review of Past Neurological Studies: No results found for this or any previous visit. Psychological-Psychiatric History: Anxiousness, Depressed and Prone  to panicking Gastrointestinal History: Reflux or heatburn Genitourinary History: No reported renal or genitourinary signs or symptoms such as difficulty voiding or producing urine, peeing blood, non-functioning kidney, kidney stones, difficulty emptying the bladder, difficulty controlling the flow of urine, or chronic kidney disease Hematological History: No reported hematological signs or symptoms  such as prolonged bleeding, low or poor functioning platelets, bruising or bleeding easily, hereditary bleeding problems, low energy levels due to low hemoglobin or being anemic Endocrine History: No reported endocrine signs or symptoms such as high or low blood sugar, rapid heart rate due to high thyroid levels, obesity or weight gain due to slow thyroid or thyroid disease Rheumatologic History: No reported rheumatological signs and symptoms such as fatigue, joint pain, tenderness, swelling, redness, heat, stiffness, decreased range of motion, with or without associated rash Musculoskeletal History: Negative for myasthenia gravis, muscular dystrophy, multiple sclerosis or malignant hyperthermia Work History: Working full time  Allergies  Ms. Beal has No Known Allergies.  Laboratory Chemistry  Inflammation Markers No results found for: CRP, ESRSEDRATE (CRP: Acute Phase) (ESR: Chronic Phase) Renal Function Markers Lab Results  Component Value Date   BUN 9 12/05/2016   CREATININE 0.69 12/05/2016   GFRAA >60 12/05/2016   GFRNONAA >60 12/05/2016   Hepatic Function Markers Lab Results  Component Value Date   AST 22 10/25/2016   ALT 11 (L) 10/25/2016   ALBUMIN 4.0 10/25/2016   ALKPHOS 110 10/25/2016   Electrolytes Lab Results  Component Value Date   NA 136 12/05/2016   K 3.3 (L) 12/05/2016   CL 96 (L) 12/05/2016   CALCIUM 8.6 (L) 12/05/2016   Neuropathy Markers No results found for: [id number] Bone Pathology Markers Lab Results  Component Value Date   ALKPHOS 110 10/25/2016   CALCIUM 8.6 (L) 12/05/2016   Coagulation Parameters Lab Results  Component Value Date   PLT 304 12/05/2016   Cardiovascular Markers Lab Results  Component Value Date   BNP 23.0 07/17/2016   HGB 10.1 (L) 12/05/2016   HCT 33.2 (L) 12/05/2016   Note: Lab results reviewed.  [place]  Drug: Ms. Breuer  reports that she does not use drugs. Alcohol:  reports that she drinks alcohol. Tobacco:  reports  that she quit smoking about 8 months ago. Her smoking use included cigarettes. She smoked 0.50 packs per day. she has never used smokeless tobacco. Medical:  has a past medical history of Anxiety, Chronic heart failure ([place]), Morbid obesity ([place]), OSA (obstructive sleep apnea), Peripheral edema, and Scoliosis. Family: family history includes Diabetes in her mother; Hypertension in her mother.  Past Surgical History:  Procedure Laterality Date  . HERNIA REPAIR    . scoliosis repair    . TUBAL LIGATION     Active Ambulatory Problems    Diagnosis Date Noted  . Chest pain at rest 10/18/2015  . Chronic diastolic heart failure ([place]) 05/15/2016  . Tachycardia 05/15/2016  . Obstructive sleep apnea on CPAP 05/15/2016  . Tobacco use 05/15/2016  . Chest pain 05/31/2016  . Hypokalemia 06/06/2016  . Depression 08/27/2016  . Chronic low back pain (Primary Area of Pain) (R) 08/27/2016  . Skin lesion of left leg 09/05/2016  . Lymphedema 09/24/2016  . Knee pain, chronic (Secondary Area of Pain) (B) (R>L) 12/12/2016  . Thoracic back pain (Fourth Area of Pain) (B) (R>L) 12/12/2016  . Chronic pain syndrome 12/12/2016  . Disorder of bone, unspecified 12/12/2016  . Other long term (current) drug therapy 12/12/2016  . Other specified health status 12/12/2016  .  Scoliosis 12/12/2016   Resolved Ambulatory Problems    Diagnosis Date Noted  . No Resolved Ambulatory Problems   Past Medical History:  Diagnosis Date  . Anxiety   . Chronic heart failure ([place])   . Morbid obesity ([place])   . OSA (obstructive sleep apnea)   . Peripheral edema   . Scoliosis    Constitutional Exam  General appearance: alert, cooperative and morbidly obese Vitals:   12/12/16 1310  BP: 128/62  Pulse: 89  Resp: 20  Temp: 98.2 F (36.8 C)  TempSrc: Oral  SpO2: 94%  Weight: (!) 352 lb (159.7 kg)  Height: '5\' 1"'$  (1.549 m)   BMI Assessment: Estimated body mass index is 66.51 kg/m as calculated from the following:    Height as of this encounter: '5\' 1"'$  (1.549 m).   Weight as of this encounter: 352 lb (159.7 kg).  BMI interpretation table: BMI level Category Range association with higher incidence of chronic pain  <18 kg/m2 Underweight   18.5-24.9 kg/m2 Ideal body weight   25-29.9 kg/m2 Overweight Increased incidence by 20%  30-34.9 kg/m2 Obese (Class I) Increased incidence by 68%  35-39.9 kg/m2 Severe obesity (Class II) Increased incidence by 136%  >40 kg/m2 Extreme obesity (Class III) Increased incidence by 254%   BMI Readings from Last 4 Encounters:  12/12/16 66.51 kg/m  12/05/16 66.51 kg/m  10/25/16 66.51 kg/m  09/24/16 67.48 kg/m   Wt Readings from Last 4 Encounters:  12/12/16 (!) 352 lb (159.7 kg)  12/05/16 (!) 352 lb (159.7 kg)  10/25/16 (!) 352 lb (159.7 kg)  09/24/16 (!) 357 lb 2 oz (162 kg)  Psych/Mental status: Alert, oriented x 3 (person, place, & time)       Eyes: PERLA Respiratory: No evidence of acute respiratory distress  Cervical Spine Exam  Inspection: No masses, redness, or swelling Alignment: Symmetrical Functional ROM: Unrestricted ROM      Stability: No instability detected Muscle strength & Tone: Functionally intact Sensory: Unimpaired Palpation: No palpable anomalies              Upper Extremity (UE) Exam    Side: Right upper extremity  Side: Left upper extremity  Inspection: No masses, redness, swelling, or asymmetry. No contractures  Inspection: No masses, redness, swelling, or asymmetry. No contractures  Functional ROM: Unrestricted ROM          Functional ROM: Unrestricted ROM          Muscle strength & Tone: Functionally intact  Muscle strength & Tone: Functionally intact  Sensory: Unimpaired  Sensory: Unimpaired  Palpation: No palpable anomalies              Palpation: No palpable anomalies              Specialized Test(s): Deferred         Specialized Test(s): Deferred          Thoracic Spine Exam  Inspection: Well healed scar from previous spine  surgery detected Alignment: Asymmetric Functional ROM: Restricted ROM Stability: No instability detected Sensory: Unimpaired Muscle strength & Tone: Complains of area being tender to palpation  Lumbar Spine Exam  Inspection: Thoraco-lumbar Scoliosis well-healed scar from previous surgery  Alignment: Asymmetric Functional ROM: Pain restricted ROM      Stability: No instability detected Muscle strength & Tone: Functionally intact Sensory: Unimpaired Palpation: Complains of area being tender to palpation       Provocative Tests: Lumbar Hyperextension and rotation test: Positive bilaterally for facet joint pain. Patrick's Maneuver:  Unable to perform                    Gait & Posture Assessment  Ambulation: Unassisted Gait: Relatively normal for age and body habitus Posture: WNL   Lower Extremity Exam    Side: Right lower extremity  Side: Left lower extremity  Inspection: No masses, redness, swelling, or asymmetry. No contractures  Inspection: No masses, redness, swelling, or asymmetry. No contractures  Functional ROM: Unrestricted ROM          Functional ROM: Unrestricted ROM          Muscle strength & Tone: Able to Toe-walk & Heel-walk without problems  Muscle strength & Tone: Able to Toe-walk & Heel-walk without problems  Sensory: Unimpaired  Sensory: Unimpaired  Palpation: No palpable anomalies  Palpation: No palpable anomalies   Assessment  Primary Diagnosis & Pertinent Problem List: The primary encounter diagnosis was Chronic right-sided low back pain with right-sided sciatica. Diagnoses of Chronic pain of both knees, Chronic bilateral thoracic back pain, Juvenile idiopathic scoliosis of thoracolumbar region, Chronic pain syndrome, Disorder of bone, unspecified, Other long term (current) drug therapy, and Other specified health status were also pertinent to this visit.  Visit Diagnosis: 1. Chronic right-sided low back pain with right-sided sciatica   2. Chronic pain of both  knees   3. Chronic bilateral thoracic back pain   4. Juvenile idiopathic scoliosis of thoracolumbar region   5. Chronic pain syndrome   6. Disorder of bone, unspecified   7. Other long term (current) drug therapy   8. Other specified health status    Plan of Care  Initial treatment plan:  Please be advised that as per protocol, today's visit has been an evaluation only. We have not taken over the patient's controlled substance management.  Problem-specific plan: No problem-specific Assessment & Plan notes found for this encounter.  Ordered Lab-work, Procedure(s), Referral(s), & Consult(s): Orders Placed This Encounter  Procedures  . DG Lumbar Spine Complete W/Bend  . DG Knee 1-2 Views Left  . DG Knee 1-2 Views Right  . DG Thoracic Spine 2 View  . Compliance Drug Analysis, Ur  . Comp. Metabolic Panel (12)  . C-reactive protein  . Sedimentation rate  . Magnesium  . 25-Hydroxyvitamin D Lcms D2+D3  . Vitamin B12   Pharmacotherapy: Medications ordered:  No orders of the defined types were placed in this encounter.  Medications administered during this visit: Thy A. Huck had no medications administered during this visit.   Pharmacotherapy under consideration:  Opioid Analgesics: The patient was informed that there is no guarantee that she would be a candidate for opioid analgesics. The decision will be made following CDC guidelines. This decision will be based on the results of diagnostic studies, as well as Ms. Freeberg's risk profile.  Membrane stabilizer: To be determined at a later time Muscle relaxant: To be determined at a later time NSAID: To be determined at a later time Other analgesic(s): To be determined at a later time   Interventional therapies under consideration: Ms. Hackel was informed that there is no guarantee that she would be a candidate for interventional therapies. The decision will be based on the results of diagnostic studies, as well as Ms. Bastin's risk  profile.  Possible procedure(s): Diagnostic bilateral LESI Diagnostic bilateral lumbar facet nerve block Possible bilateral lumbar facet RFA Bilateral Hyalgan series Diagnostic bilateral intra-articular knee injection Diagnostic bilateral genicular nerve block Possible bilateral genicular RFA    Provider-requested follow-up: Return  for 2nd Visit, w/ Dr. Dossie Arbour, after [facility] eval.  Future Appointments  Date Time Provider [place]  12/13/2016  1:00 PM Alisa Graff, [place] [facility] None    Primary Care Physician: Derinda Late, MD Location: [facility] Outpatient Pain Management Facility Note by:  Date: 12/12/2016; Time: 3:24 PM  Pain Score Disclaimer: We use the NRS-11 scale. This is a self-reported, subjective measurement of pain severity with only modest accuracy. It is used primarily to identify changes within a particular patient. It must be understood that outpatient pain scales are significantly less accurate that those used for research, where they can be applied under ideal controlled circumstances with minimal exposure to variables. In reality, the score is likely to be a combination of pain intensity and pain affect, where pain affect describes the degree of emotional arousal or changes in action readiness caused by the sensory experience of pain. Factors such as social and work situation, setting, emotional state, anxiety levels, expectation, and prior pain experience may influence pain perception and show large inter-individual differences that may also be affected by time variables.  Patient instructions provided during this appointment: Patient Instructions    ____________________________________________________________________________________________  Appointment Policy Summary  It is our goal and responsibility to provide the medical community with assistance in the evaluation and management of patients with chronic pain. Unfortunately our resources are limited.  Because we do not have an unlimited amount of time, or available appointments, we are required to closely monitor and manage their use. The following rules exist to maximize their use:  Patient's responsibilities: 1. Punctuality:  At what time should I arrive? You should be physically present in our office 30 minutes before your scheduled appointment. Your scheduled appointment is with your assigned healthcare provider. However, it takes 5-10 minutes to be "checked-in", and another 15 minutes for the nurses to do the admission. If you arrive to our office at the time you were given for your appointment, you will end up being at least 20-25 minutes late to your appointment with the provider. 2. Tardiness:  What happens if I arrive only a few minutes after my scheduled appointment time? You will need to reschedule your appointment. The cutoff is your appointment time. This is why it is so important that you arrive at least 30 minutes before that appointment. If you have an appointment scheduled for 10:00 AM and you arrive at 10:01, you will be required to reschedule your appointment.  3. Plan ahead:  Always assume that you will encounter traffic on your way in. Plan for it. If you are dependent on a driver, make sure they understand these rules and the need to arrive early. 4. Other appointments and responsibilities:  Avoid scheduling any other appointments before or after your pain clinic appointments.  5. Be prepared:  Write down everything that you need to discuss with your healthcare provider and give this information to the admitting nurse. Write down the medications that you will need refilled. Bring your pills and bottles (even the empty ones), to all of your appointments, except for those where a procedure is scheduled. 6. No children or pets:  Find someone to take care of them. It is not appropriate to bring them in. 7. Scheduling changes:  We request "advanced notification" of any changes or  cancellations. 8. Advanced notification:  Defined as a time period of more than 24 hours prior to the originally scheduled appointment. This allows for the appointment to be offered to other patients. 9. Rescheduling:  When a visit is rescheduled, it will require the cancellation of the original appointment. For this reason they both fall within the category of "Cancellations".  10. Cancellations:  They require advanced notification. Any cancellation less than 24 hours before the  appointment will be recorded as a "No Show". 11. No Show:  Defined as an unkept appointment where the patient failed to notify or declare to the practice their intention or inability to keep the appointment.  Corrective process for repeat offenders:  1. Tardiness: Three (3) episodes of rescheduling due to late arrivals will be recorded as one (1) "No Show". 2. Cancellation or reschedule: Three (3) cancellations or rescheduling will be recorded as one (1) "No Show". 3. "No Shows": Three (3) "No Shows" within a 12 month period will result in discharge from the practice.  ____________________________________________________________________________________________   ____________________________________________________________________________________________  Pain Scale  Introduction: The pain score used by this practice is the Verbal Numerical Rating Scale (VNRS-11). This is an 11-point scale. It is for adults and children 10 years or older. There are significant differences in how the pain score is reported, used, and applied. Forget everything you learned in the past and learn this scoring system.  General Information: The scale should reflect your current level of pain. Unless you are specifically asked for the level of your worst pain, or your average pain. If you are asked for one of these two, then it should be understood that it is over the past 24 hours.  Basic Activities of Daily Living (ADL): Personal  hygiene, dressing, eating, transferring, and using restroom.  Instructions: Most patients tend to report their level of pain as a combination of two factors, their physical pain and their psychosocial pain. This last one is also known as "suffering" and it is reflection of how physical pain affects you socially and psychologically. From now on, report them separately. From this point on, when asked to report your pain level, report only your physical pain. Use the following table for reference.  Pain Clinic Pain Levels (0-5/10)  Pain Level Score  Description  No Pain 0   Mild pain 1 Nagging, annoying, but does not interfere with basic activities of daily living (ADL). Patients are able to eat, bathe, get dressed, toileting (being able to get on and off the toilet and perform personal hygiene functions), transfer (move in and out of bed or a chair without assistance), and maintain continence (able to control bladder and bowel functions). Blood pressure and heart rate are unaffected. A normal heart rate for a healthy adult ranges from 60 to 100 bpm (beats per minute).   Mild to moderate pain 2 Noticeable and distracting. Impossible to hide from other people. More frequent flare-ups. Still possible to adapt and function close to normal. It can be very annoying and may have occasional stronger flare-ups. With discipline, patients may get used to it and adapt.   Moderate pain 3 Interferes significantly with activities of daily living (ADL). It becomes difficult to feed, bathe, get dressed, get on and off the toilet or to perform personal hygiene functions. Difficult to get in and out of bed or a chair without assistance. Very distracting. With effort, it can be ignored when deeply involved in activities.   Moderately severe pain 4 Impossible to ignore for more than a few minutes. With effort, patients may still be able to manage work or participate in some social activities. Very difficult to  concentrate. Signs of autonomic nervous system discharge are evident: dilated  pupils (mydriasis); mild sweating (diaphoresis); sleep interference. Heart rate becomes elevated (>115 bpm). Diastolic blood pressure (lower number) rises above 100 mmHg. Patients find relief in laying down and not moving.   Severe pain 5 Intense and extremely unpleasant. Associated with frowning face and frequent crying. Pain overwhelms the senses.  Ability to do any activity or maintain social relationships becomes significantly limited. Conversation becomes difficult. Pacing back and forth is common, as getting into a comfortable position is nearly impossible. Pain wakes you up from deep sleep. Physical signs will be obvious: pupillary dilation; increased sweating; goosebumps; brisk reflexes; cold, clammy hands and feet; nausea, vomiting or dry heaves; loss of appetite; significant sleep disturbance with inability to fall asleep or to remain asleep. When persistent, significant weight loss is observed due to the complete loss of appetite and sleep deprivation.  Blood pressure and heart rate becomes significantly elevated. Caution: If elevated blood pressure triggers a pounding headache associated with blurred vision, then the patient should immediately seek attention at an urgent or emergency care unit, as these may be signs of an impending stroke.    Emergency Department Pain Levels (6-10/10)  Emergency Room Pain 6 Severely limiting. Requires emergency care and should not be seen or managed at an outpatient pain management facility. Communication becomes difficult and requires great effort. Assistance to reach the emergency department may be required. Facial flushing and profuse sweating along with potentially dangerous increases in heart rate and blood pressure will be evident.   Distressing pain 7 Self-care is very difficult. Assistance is required to transport, or use restroom. Assistance to reach the emergency department  will be required. Tasks requiring coordination, such as bathing and getting dressed become very difficult.   Disabling pain 8 Self-care is no longer possible. At this level, pain is disabling. The individual is unable to do even the most "basic" activities such as walking, eating, bathing, dressing, transferring to a bed, or toileting. Fine motor skills are lost. It is difficult to think clearly.   Incapacitating pain 9 Pain becomes incapacitating. Thought processing is no longer possible. Difficult to remember your own name. Control of movement and coordination are lost.   The worst pain imaginable 10 At this level, most patients pass out from pain. When this level is reached, collapse of the autonomic nervous system occurs, leading to a sudden drop in blood pressure and heart rate. This in turn results in a temporary and dramatic drop in blood flow to the brain, leading to a loss of consciousness. Fainting is one of the body's self defense mechanisms. Passing out puts the brain in a calmed state and causes it to shut down for a while, in order to begin the healing process.    Summary: 1. Refer to this scale when providing [place] with your pain level. 2. Be accurate and careful when reporting your pain level. This will help with your care. 3. Over-reporting your pain level will lead to loss of credibility. 4. Even a level of 1/10 means that there is pain and will be treated at our facility. 5. High, inaccurate reporting will be documented as "Symptom Exaggeration", leading to loss of credibility and suspicions of possible secondary gains such as obtaining more narcotics, or wanting to appear disabled, for fraudulent reasons. 6. Only pain levels of 5 or below will be seen at our facility. 7. Pain levels of 6 and above will be sent to the Emergency Department and the appointment cancelled. ____________________________________________________________________________________________

## 2016-12-12 NOTE — Progress Notes (Signed)
Safety precautions to be maintained throughout the outpatient stay will include: orient to surroundings, keep bed in low position, maintain call bell within reach at all times, provide assistance with transfer out of bed and ambulation.  

## 2016-12-13 ENCOUNTER — Other Ambulatory Visit: Payer: Self-pay

## 2016-12-13 ENCOUNTER — Encounter: Payer: Self-pay | Admitting: Family

## 2016-12-13 ENCOUNTER — Ambulatory Visit: Payer: Medicaid Other | Attending: Family | Admitting: Family

## 2016-12-13 VITALS — BP 102/60 | HR 88 | Resp 18 | Ht 61.0 in | Wt 342.2 lb

## 2016-12-13 DIAGNOSIS — G4733 Obstructive sleep apnea (adult) (pediatric): Secondary | ICD-10-CM | POA: Insufficient documentation

## 2016-12-13 DIAGNOSIS — M419 Scoliosis, unspecified: Secondary | ICD-10-CM | POA: Diagnosis not present

## 2016-12-13 DIAGNOSIS — I5032 Chronic diastolic (congestive) heart failure: Secondary | ICD-10-CM

## 2016-12-13 DIAGNOSIS — I890 Lymphedema, not elsewhere classified: Secondary | ICD-10-CM | POA: Diagnosis not present

## 2016-12-13 DIAGNOSIS — Z720 Tobacco use: Secondary | ICD-10-CM

## 2016-12-13 DIAGNOSIS — F329 Major depressive disorder, single episode, unspecified: Secondary | ICD-10-CM | POA: Insufficient documentation

## 2016-12-13 DIAGNOSIS — I509 Heart failure, unspecified: Secondary | ICD-10-CM | POA: Diagnosis present

## 2016-12-13 DIAGNOSIS — F419 Anxiety disorder, unspecified: Secondary | ICD-10-CM | POA: Insufficient documentation

## 2016-12-13 DIAGNOSIS — F17210 Nicotine dependence, cigarettes, uncomplicated: Secondary | ICD-10-CM | POA: Diagnosis not present

## 2016-12-13 DIAGNOSIS — Z79899 Other long term (current) drug therapy: Secondary | ICD-10-CM | POA: Diagnosis not present

## 2016-12-13 DIAGNOSIS — Z9989 Dependence on other enabling machines and devices: Secondary | ICD-10-CM

## 2016-12-13 NOTE — Patient Instructions (Signed)
Resume weighing daily and call for an overnight weight gain of > 2 pounds or a weekly weight gain of >5 pounds. 

## 2016-12-13 NOTE — Progress Notes (Signed)
Patient ID: Tracey White, female    DOB: February 28, 1980, 36 y.o.   MRN: [id number]  HPI  Tracey White is a 36 y/o female with a history of anxiety, obstructive sleep apnea (with nasal CPAP), scoliosis, recent tobacco use and chronic heart failure.   Reviewed last echo report done 10/19/15 which showed an EF of 65-70% along with trivial TR.   Was in the ED 12/05/16 due to worsening back pain and HF. Was treated and released. Presented to the ED 10/26/16 and left without being seen. Was in the ED 08/05/16 with peripheral edema. Was treated with bumex with good response and discharged home. Was in the ED 07/17/16 due to low back pain along with peripheral edema. + bulging lumbar disc; Negative for DVT, IV diuretics given and she was released home. Was in the ED 07/04/16 due to chest pain & dizziness due to extra diuretic that patient took on her own at home. Treated and released.  Admitted 05/31/16 with chest pain and HF exacerbation. Initially treated with IV furosemide and transitioned to oral diuretics. Cardiology consult obtained. Discharged the following day.   She presents today for a follow-up visit with a chief complaint of moderate fatigue with little exertion. She describes this as chronic in nature having been present for several years with varying levels of severity. She has associated cough, shortness of breath and depression along with this. She denies any chest pain, wheezing, edema, palpitations, dizziness, difficulty sleeping or weight gain.   Past Medical History:  Diagnosis Date  . Anxiety   . Chronic heart failure (HCC)   . Morbid obesity (HCC)   . OSA (obstructive sleep apnea)    uses CPAP at home  . Peripheral edema   . Scoliosis    Past Surgical History:  Procedure Laterality Date  . HERNIA REPAIR    . scoliosis repair    . TUBAL LIGATION     Family History  Problem Relation Age of Onset  . Diabetes Mother   . Hypertension Mother    Social History   Tobacco Use  . Smoking  status: Former Smoker    Packs/day: 0.50    Types: Cigarettes    Last attempt to quit: 03/27/2016    Years since quitting: 0.7  . Smokeless tobacco: Never Used  Substance Use Topics  . Alcohol use: Yes    Comment: occasionally   No Known Allergies  Prior to Admission medications   Medication Sig Start Date End Date Taking? Authorizing Provider  albuterol (PROVENTIL HFA;VENTOLIN HFA) 108 (90 Base) MCG/ACT inhaler Inhale 2-4 puffs by mouth every 4 hours as needed for wheezing, cough, and/or shortness of breath 05/12/16  Yes Loleta RoseForbach, Cory, MD  ALPRAZolam Prudy Feeler(XANAX) 0.5 MG tablet Take 1 tablet by mouth daily as needed for anxiety.  11/14/15  Yes [provider]  ARIPiprazole (ABILIFY) 2 MG tablet Take 2 mg daily by mouth.   Yes [provider]  beclomethasone (QVAR) 40 MCG/ACT inhaler Inhale 1 puff into the lungs 2 (two) times daily. 10/20/15  Yes Wieting, Richard, MD  bumetanide (BUMEX) 2 MG tablet Take 4 mg 2 (two) times daily by mouth. 4 mg in the AM and 2 mg in the PM   Yes [provider]  diclofenac sodium (VOLTAREN) 1 % GEL Apply 4 (four) times daily topically.   Yes [provider]  diltiazem (CARDIZEM) 120 MG tablet Take 120 mg daily by mouth.   Yes [provider]  escitalopram (LEXAPRO) 20 MG  tablet Take 20 mg by mouth daily.    Yes [provider]  gabapentin (NEURONTIN) 100 MG capsule Take 100-200 mg by mouth 3 (three) times daily.   Yes [provider]  lidocaine (LIDODERM) 5 % Place 1 patch daily onto the skin. Remove & Discard patch within 12 hours or as directed by MD   Yes [provider]  losartan (COZAAR) 25 MG tablet Take 25 mg by mouth daily.   Yes [provider]  metoprolol succinate (TOPROL-XL) 25 MG 24 hr tablet Take 2 tablets daily by mouth.  10/30/15  Yes [provider]  montelukast (SINGULAIR) 10 MG tablet Take 10 mg by mouth at bedtime.   Yes [provider]  potassium  chloride SA (K-DUR,KLOR-CON) 20 MEQ tablet Take 40 mEq by mouth 2 (two) times daily.   Yes [provider]    Review of Systems  Constitutional: Positive for fatigue (better with CPAP). Negative for appetite change.  HENT: Positive for congestion. Negative for postnasal drip and sore throat.   Eyes: Negative.   Respiratory: Positive for cough and shortness of breath. Negative for chest tightness and wheezing.   Cardiovascular: Negative for chest pain, palpitations and leg swelling.  Gastrointestinal: Negative for abdominal distention and abdominal pain.  Endocrine: Negative.   Genitourinary: Negative.   Musculoskeletal: Positive for arthralgias (knees) and back pain.  Skin: Negative.   Allergic/Immunologic: Negative.   Neurological: Negative for dizziness and light-headedness.  Hematological: Negative for adenopathy. Does not bruise/bleed easily.  Psychiatric/Behavioral: Positive for dysphoric mood. Negative for sleep disturbance (has resumed wearing CPAP) and suicidal ideas. The patient is not nervous/anxious.    Vitals:   12/13/16 1306  BP: 102/60  Pulse: 88  Resp: 18  SpO2: 97%  Weight: (!) 342 lb 4 oz (155.2 kg)  Height: 5\' 1"  (1.549 m)   Wt Readings from Last 3 Encounters:  12/13/16 (!) 342 lb 4 oz (155.2 kg)  12/12/16 (!) 352 lb (159.7 kg)  12/05/16 (!) 352 lb (159.7 kg)    Lab Results  Component Value Date   CREATININE 0.84 12/12/2016   CREATININE 0.69 12/05/2016   CREATININE 0.73 10/25/2016    Physical Exam  Constitutional: She is oriented to person, place, and time. She appears well-developed and well-nourished.  HENT:  Head: Normocephalic and atraumatic.  Neck: Normal range of motion. Neck supple. No JVD present.  Cardiovascular: Normal rate and regular rhythm.  Pulmonary/Chest: Effort normal. She has no wheezes. She has no rhonchi. She has no rales.  Abdominal: Soft. She exhibits no distension. There is no tenderness.  Musculoskeletal: She exhibits  no edema or tenderness.  Neurological: She is alert and oriented to person, place, and time.  Skin: Skin is warm and dry.  Psychiatric: She has a normal mood and affect. Her behavior is normal. Thought content normal.  Nursing note and vitals reviewed.   Assessment & Plan:  1: Chronic heart failure with preserved ejection fraction- - NYHA class III - euvolemic today - not weighing daily. Encouraged her to resume weighing daily so that she can call for an overnight weight gain of >2 pounds or a weekly weight gain of >5 pounds. 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 - not adding salt and has been trying to read food labels more often - does wear TED socks at home but hasn't worn them recently and she was encouraged to elevate her legs when she could -  has not received her flu vaccine yet - saw cardiologist Juliann Pares) 11/14/16 - BMP from 12/12/16 shows sodium 139, potassium 3.7 and GFR 103 - beginning the process of looking into bariatric surgery  2: Depression- - feels like her depression is stable at this time - working some now; does get tired after working all day - just saw her counselor today  3: Obstructive sleep apnea- -  wearing her nasal CPAP 2-3 hours/night and she says that is all she can tolerate - would like to try a full face mask and she was instructed to call the company and ask them to fax a form to [place] about what they would need - saw PCP Larwance Sachs) 09/26/16  4: Lymphedema- - stage 1 lymphedema with slight improvement after elevation - edema returns after sitting/walking even for short periods of time - currently without edema  5: Tobacco use- - has resumed smoking ~ 1/2 ppd of cigarettes; says that she started back due to the stress that she's under - says that she may try the nicotine patch - complete cessation discussed for 3 minutes with her; she says that she would not be a candidate for the bariatric surgery if  she's smoking per their policy  Patient did not bring her medications nor a list. Each medication was verbally reviewed with the patient and she was encouraged to bring the bottles to every visit to confirm accuracy of list.  Return in 6 months or sooner for any questions/problems before then.

## 2016-12-18 ENCOUNTER — Ambulatory Visit: Payer: Self-pay | Admitting: Family

## 2016-12-18 LAB — COMPLIANCE DRUG ANALYSIS, UR

## 2016-12-20 LAB — COMP. METABOLIC PANEL (12)
AST: 10 IU/L (ref 0–40)
Albumin/Globulin Ratio: 1.1 — ABNORMAL LOW (ref 1.2–2.2)
Albumin: 4 g/dL (ref 3.5–5.5)
Alkaline Phosphatase: 113 IU/L (ref 39–117)
BILIRUBIN TOTAL: 0.4 mg/dL (ref 0.0–1.2)
BUN/Creatinine Ratio: 15 (ref 9–23)
BUN: 13 mg/dL (ref 6–20)
CREATININE: 0.84 mg/dL (ref 0.57–1.00)
Calcium: 9.4 mg/dL (ref 8.7–10.2)
Chloride: 92 mmol/L — ABNORMAL LOW (ref 96–106)
GFR calc Af Amer: 103 mL/min/{1.73_m2} (ref >59)
GFR calc non Af Amer: 90 mL/min/{1.73_m2} (ref >59)
GLOBULIN, TOTAL: 3.5 g/dL (ref 1.5–4.5)
Glucose: 96 mg/dL (ref 65–99)
Potassium: 3.7 mmol/L (ref 3.5–5.2)
SODIUM: 139 mmol/L (ref 134–144)
TOTAL PROTEIN: 7.5 g/dL (ref 6.0–8.5)

## 2016-12-20 LAB — SEDIMENTATION RATE: Sed Rate: 59 mm/hr — ABNORMAL HIGH (ref 0–32)

## 2016-12-20 LAB — 25-HYDROXYVITAMIN D LCMS D2+D3
25-HYDROXY, VITAMIN D-2: 3.6 ng/mL
25-HYDROXY, VITAMIN D-3: 5.5 ng/mL

## 2016-12-20 LAB — VITAMIN B12: Vitamin B-12: 432 pg/mL (ref 232–1245)

## 2016-12-20 LAB — C-REACTIVE PROTEIN: CRP: 58.7 mg/L — AB (ref 0.0–4.9)

## 2016-12-20 LAB — 25-HYDROXY VITAMIN D LCMS D2+D3: 25-Hydroxy, Vitamin D: 9.1 ng/mL — ABNORMAL LOW

## 2016-12-20 LAB — MAGNESIUM: Magnesium: 2.1 mg/dL (ref 1.6–2.3)

## 2016-12-24 ENCOUNTER — Emergency Department (HOSPITAL_COMMUNITY): Payer: Medicaid Other

## 2016-12-24 ENCOUNTER — Inpatient Hospital Stay (HOSPITAL_COMMUNITY)
Admission: EM | Admit: 2016-12-24 | Discharge: 2016-12-26 | DRG: 202 | Disposition: A | Discharge status: Home / Self Care | Payer: Medicaid Other | Attending: Internal Medicine | Admitting: Internal Medicine

## 2016-12-24 ENCOUNTER — Encounter (HOSPITAL_COMMUNITY): Payer: Self-pay | Admitting: Neurology

## 2016-12-24 DIAGNOSIS — F329 Major depressive disorder, single episode, unspecified: Secondary | ICD-10-CM | POA: Diagnosis present

## 2016-12-24 DIAGNOSIS — J4541 Moderate persistent asthma with (acute) exacerbation: Principal | ICD-10-CM | POA: Diagnosis present

## 2016-12-24 DIAGNOSIS — R0689 Other abnormalities of breathing: Secondary | ICD-10-CM | POA: Diagnosis present

## 2016-12-24 DIAGNOSIS — B9789 Other viral agents as the cause of diseases classified elsewhere: Secondary | ICD-10-CM | POA: Diagnosis present

## 2016-12-24 DIAGNOSIS — G4733 Obstructive sleep apnea (adult) (pediatric): Secondary | ICD-10-CM | POA: Diagnosis present

## 2016-12-24 DIAGNOSIS — J209 Acute bronchitis, unspecified: Secondary | ICD-10-CM | POA: Diagnosis present

## 2016-12-24 DIAGNOSIS — R0603 Acute respiratory distress: Secondary | ICD-10-CM | POA: Diagnosis present

## 2016-12-24 DIAGNOSIS — R200 Anesthesia of skin: Secondary | ICD-10-CM | POA: Diagnosis present

## 2016-12-24 DIAGNOSIS — Z23 Encounter for immunization: Secondary | ICD-10-CM

## 2016-12-24 DIAGNOSIS — R0902 Hypoxemia: Secondary | ICD-10-CM | POA: Diagnosis present

## 2016-12-24 DIAGNOSIS — Y92230 Patient room in hospital as the place of occurrence of the external cause: Secondary | ICD-10-CM | POA: Diagnosis not present

## 2016-12-24 DIAGNOSIS — M549 Dorsalgia, unspecified: Secondary | ICD-10-CM | POA: Diagnosis present

## 2016-12-24 DIAGNOSIS — D72829 Elevated white blood cell count, unspecified: Secondary | ICD-10-CM | POA: Diagnosis not present

## 2016-12-24 DIAGNOSIS — M419 Scoliosis, unspecified: Secondary | ICD-10-CM | POA: Diagnosis present

## 2016-12-24 DIAGNOSIS — I5032 Chronic diastolic (congestive) heart failure: Secondary | ICD-10-CM | POA: Diagnosis present

## 2016-12-24 DIAGNOSIS — Z6841 Body Mass Index (BMI) 40.0 and over, adult: Secondary | ICD-10-CM

## 2016-12-24 DIAGNOSIS — F419 Anxiety disorder, unspecified: Secondary | ICD-10-CM | POA: Diagnosis present

## 2016-12-24 DIAGNOSIS — T380X5A Adverse effect of glucocorticoids and synthetic analogues, initial encounter: Secondary | ICD-10-CM | POA: Diagnosis not present

## 2016-12-24 DIAGNOSIS — Z79899 Other long term (current) drug therapy: Secondary | ICD-10-CM

## 2016-12-24 DIAGNOSIS — G8929 Other chronic pain: Secondary | ICD-10-CM | POA: Diagnosis present

## 2016-12-24 DIAGNOSIS — Z7951 Long term (current) use of inhaled steroids: Secondary | ICD-10-CM

## 2016-12-24 DIAGNOSIS — R402414 Glasgow coma scale score 13-15, 24 hours or more after hospital admission: Secondary | ICD-10-CM | POA: Diagnosis not present

## 2016-12-24 DIAGNOSIS — Z9989 Dependence on other enabling machines and devices: Secondary | ICD-10-CM

## 2016-12-24 DIAGNOSIS — D649 Anemia, unspecified: Secondary | ICD-10-CM

## 2016-12-24 DIAGNOSIS — F17210 Nicotine dependence, cigarettes, uncomplicated: Secondary | ICD-10-CM | POA: Diagnosis present

## 2016-12-24 DIAGNOSIS — J45909 Unspecified asthma, uncomplicated: Secondary | ICD-10-CM | POA: Diagnosis present

## 2016-12-24 DIAGNOSIS — I110 Hypertensive heart disease with heart failure: Secondary | ICD-10-CM | POA: Diagnosis present

## 2016-12-24 LAB — CBC
HCT: 33.7 % — ABNORMAL LOW (ref 36.0–46.0)
Hemoglobin: 9.9 g/dL — ABNORMAL LOW (ref 12.0–15.0)
MCH: 21.7 pg — ABNORMAL LOW (ref 26.0–34.0)
MCHC: 29.4 g/dL — ABNORMAL LOW (ref 30.0–36.0)
MCV: 73.9 fL — AB (ref 78.0–100.0)
PLATELETS: 304 10*3/uL (ref 150–400)
RBC: 4.56 MIL/uL (ref 3.87–5.11)
RDW: 18.8 % — AB (ref 11.5–15.5)
WBC: 12.7 10*3/uL — AB (ref 4.0–10.5)

## 2016-12-24 LAB — D-DIMER, QUANTITATIVE: D-Dimer, Quant: <0.27 ug/mL-FEU (ref 0.00–0.50)

## 2016-12-24 LAB — BASIC METABOLIC PANEL
Anion gap: 11 (ref 5–15)
BUN: 8 mg/dL (ref 6–20)
CALCIUM: 8.6 mg/dL — AB (ref 8.9–10.3)
CHLORIDE: 99 mmol/L — AB (ref 101–111)
CO2: 27 mmol/L (ref 22–32)
CREATININE: 0.71 mg/dL (ref 0.44–1.00)
GFR calc Af Amer: >60 mL/min (ref >60)
Glucose, Bld: 138 mg/dL — ABNORMAL HIGH (ref 65–99)
Potassium: 3.5 mmol/L (ref 3.5–5.1)
SODIUM: 137 mmol/L (ref 135–145)

## 2016-12-24 LAB — BRAIN NATRIURETIC PEPTIDE: B Natriuretic Peptide: 15.3 pg/mL (ref 0.0–100.0)

## 2016-12-24 LAB — I-STAT BETA HCG BLOOD, ED (MC, WL, AP ONLY)

## 2016-12-24 LAB — I-STAT TROPONIN, ED: Troponin i, poc: 0 ng/mL (ref 0.00–0.08)

## 2016-12-24 IMAGING — CR DG CHEST 2V
2 series · 2 of 2 positions shown · non-contrast
Comparison: [DATE]

CLINICAL DATA: Shortness of breath. Cough. CHF. Hypertension.
Ex-smoker.

EXAM:
CHEST  2 VIEW

[chest pa]
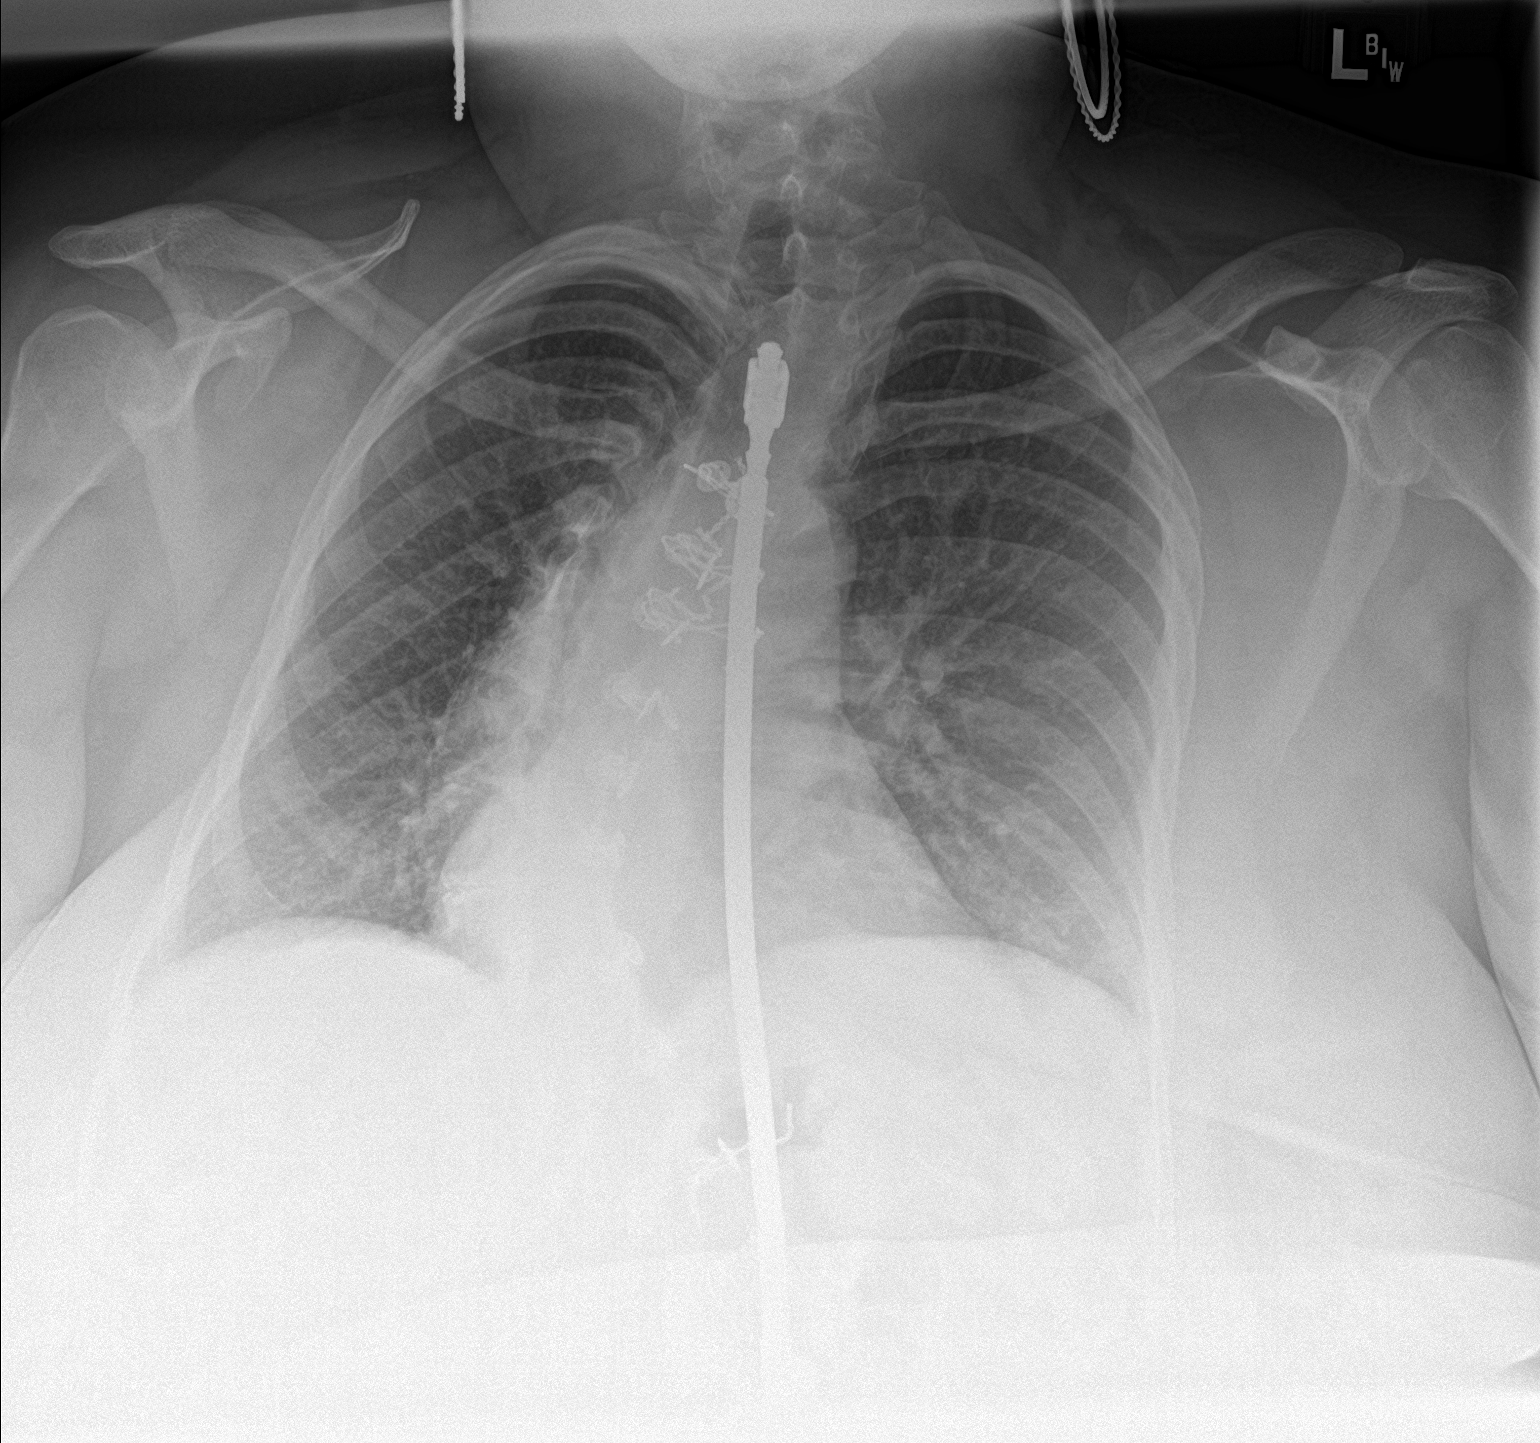

[chest lat]
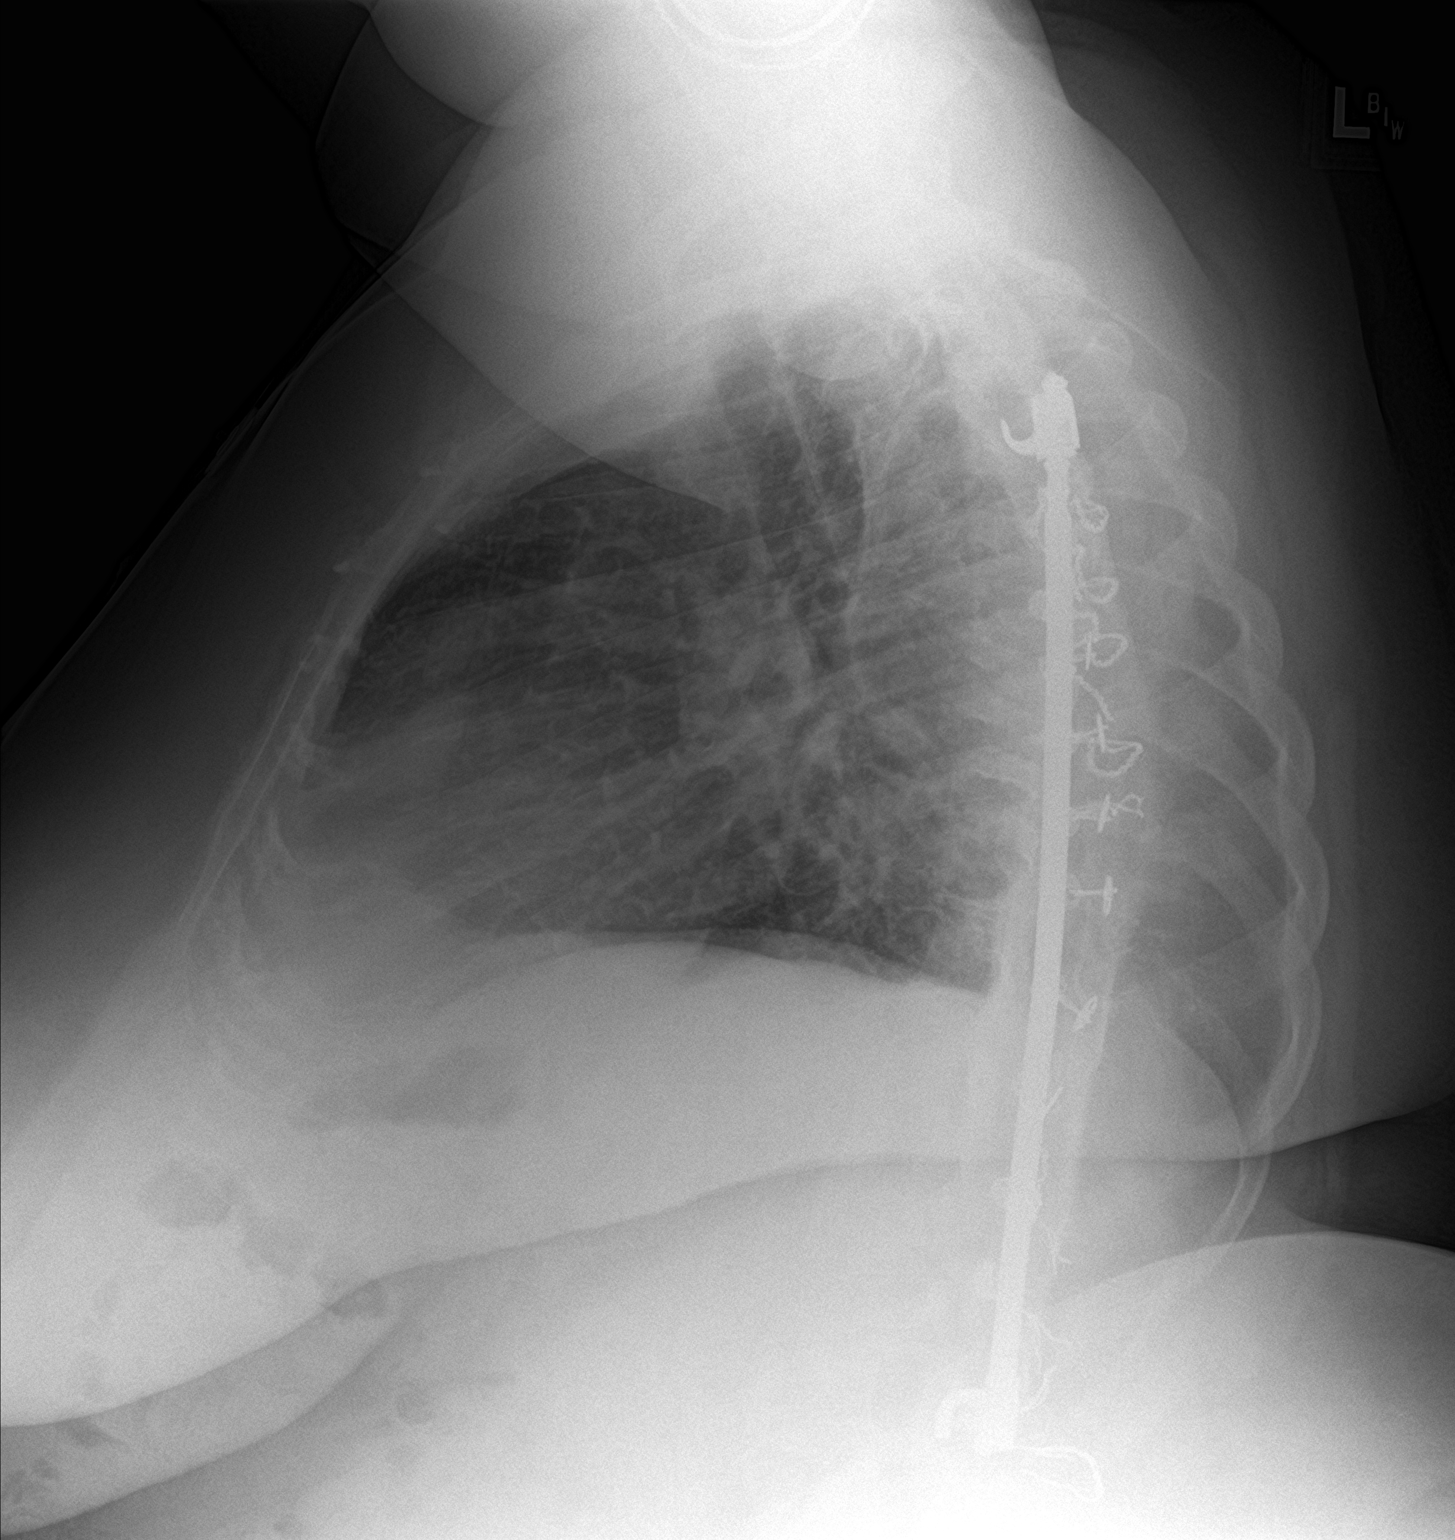

[2 of 2 positions shown; findings below may reference images not displayed]

FINDINGS: Convex right thoracic spine curvature, status post thoracolumbar
spine fixation. Midline trachea. Borderline cardiomegaly.
Mediastinal contours otherwise within normal limits. No pleural
effusion or pneumothorax. Pulmonary interstitial prominence is
favored to be due to low lung volumes and patient body habitus. No
overt congestive failure or lobar consolidation.
IMPRESSION: No acute cardiopulmonary disease.

## 2016-12-24 MED ORDER — ALBUTEROL SULFATE (2.5 MG/3ML) 0.083% IN NEBU
5.0000 mg | INHALATION_SOLUTION | Freq: Once | RESPIRATORY_TRACT | Status: AC
  Administered 2016-12-24: 5 mg via RESPIRATORY_TRACT
  Filled 2016-12-24: qty 6

## 2016-12-24 MED ORDER — IBUPROFEN 400 MG PO TABS
600.0000 mg | ORAL_TABLET | Freq: Once | ORAL | Status: AC
  Administered 2016-12-24: 18:00:00 600 mg via ORAL
  Filled 2016-12-24: qty 1

## 2016-12-24 MED ORDER — IPRATROPIUM-ALBUTEROL 0.5-2.5 (3) MG/3ML IN SOLN
3.0000 mL | Freq: Once | RESPIRATORY_TRACT | Status: AC
  Administered 2016-12-24: 3 mL via RESPIRATORY_TRACT
  Filled 2016-12-24: qty 3

## 2016-12-24 MED ORDER — PREDNISONE 20 MG PO TABS
40.0000 mg | ORAL_TABLET | Freq: Once | ORAL | Status: AC
  Administered 2016-12-24: 40 mg via ORAL
  Filled 2016-12-24: qty 2

## 2016-12-24 NOTE — ED Notes (Signed)
Ambulated pt, per Tresa EndoKelly - PA. Pt's O2 was between 88%-94%. Pt asked how she was feeling. Pt stated that she felt "short of breath". Once pt sat down on the bed, pt's O2 returned to 93%. Pt's HR maintained around 116. Informed Tresa EndoKelly - PA and Dr. Clarene DukeLittle.

## 2016-12-24 NOTE — ED Provider Notes (Signed)
[facility] Provider Note   CSN: [id number] Arrival date & time: 12/24/16  1050     History   Chief Complaint Chief Complaint  Patient presents with  . Shortness of Breath    HPI Tracey White is a 36 y.o. female who presents with SOB. PMH significant for asthma, CHF with preserved EF, obesity, OSA (on CPAP), anxiety. She states that she has had worsening SOB for the past 3-4 days as well as a productive cough and wheezing. She reports severe SOB with any small amount of exertion as well as chest pain and back pain with coughing. She went to her PCP yesterday and was told to increase her fluid pill and was given an rx for Prednisone. She states that she has been using her inhaler with no relief and was up all night due to cough. She also has some facial pain and a headache. No fever, chills. She reports worsening of her chronic back pain due to coughing so hard. She also has lightheadedness from coughing. She had a breathing tx in triage earlier which seemed to help initially but SOB came back. She has chronic peripheral edema with her R worse than the L which seems unchanged. She is unsure if her weight is up or not. On review of EMR weight on 11/8 was 352. Weight on 11/16 was 342. Weight yesterday was 349. Today is 349. No recent surgery/travel/immobilization, hx of cancer, leg swelling, hemptysis, prior DVT/PE, or hormone use.   HPI  Past Medical History:  Diagnosis Date  . Anxiety   . CHF (congestive heart failure) (HCC)   . Chronic heart failure (HCC)   . Morbid obesity (HCC)   . OSA (obstructive sleep apnea)    uses CPAP at home  . Peripheral edema   . Scoliosis     Patient Active Problem List   Diagnosis Date Noted  . Knee pain, chronic (Secondary Area of Pain) (B) (R>L) 12/12/2016  . Thoracic back pain (Fourth Area of Pain) (B) (R>L) 12/12/2016  . Chronic pain syndrome 12/12/2016  . Disorder of bone, unspecified 12/12/2016  . Other  long term (current) drug therapy 12/12/2016  . Scoliosis 12/12/2016  . Lymphedema 09/24/2016  . Skin lesion of left leg 09/05/2016  . Depression 08/27/2016  . Chronic low back pain (Primary Area of Pain) (R) 08/27/2016  . Hypokalemia 06/06/2016  . Chest pain 05/31/2016  . Chronic diastolic heart failure (HCC) 05/15/2016  . Tachycardia 05/15/2016  . Obstructive sleep apnea on CPAP 05/15/2016  . Tobacco use 05/15/2016  . Chest pain at rest 10/18/2015    Past Surgical History:  Procedure Laterality Date  . HERNIA REPAIR    . scoliosis repair    . TUBAL LIGATION      OB History    No data available       Home Medications    Prior to Admission medications   Medication Sig Start Date End Date Taking? Authorizing Provider  albuterol (PROVENTIL HFA;VENTOLIN HFA) 108 (90 Base) MCG/ACT inhaler Inhale 2-4 puffs by mouth every 4 hours as needed for wheezing, cough, and/or shortness of breath 05/12/16   Loleta RoseForbach, Cory, MD  ALPRAZolam Prudy Feeler(XANAX) 0.5 MG tablet Take 1 tablet by mouth daily as needed for anxiety.  11/14/15   [provider]  ARIPiprazole (ABILIFY) 2 MG tablet Take 2 mg daily by mouth.    [provider]  beclomethasone (QVAR) 40 MCG/ACT inhaler Inhale 1 puff into the lungs 2 (two)  times daily. 10/20/15   Alford Highland, MD  bumetanide (BUMEX) 2 MG tablet Take 4 mg 2 (two) times daily by mouth. 4 mg in the AM and 2 mg in the PM    [provider]  diclofenac sodium (VOLTAREN) 1 % GEL Apply 4 (four) times daily topically.    [provider]  diltiazem (CARDIZEM) 120 MG tablet Take 120 mg daily by mouth.    [provider]  escitalopram (LEXAPRO) 20 MG tablet Take 20 mg by mouth daily.     [provider]  gabapentin (NEURONTIN) 100 MG capsule Take 100-200 mg by mouth 3 (three) times daily.    [provider]  lidocaine (LIDODERM) 5 % Place 1 patch daily onto the skin. Remove & Discard patch within 12 hours or as  directed by MD    [provider]  losartan (COZAAR) 25 MG tablet Take 25 mg by mouth daily.    [provider]  metoprolol succinate (TOPROL-XL) 25 MG 24 hr tablet Take 2 tablets daily by mouth.  10/30/15   [provider]  montelukast (SINGULAIR) 10 MG tablet Take 10 mg by mouth at bedtime.    [provider]  potassium chloride SA (K-DUR,KLOR-CON) 20 MEQ tablet Take 40 mEq by mouth 2 (two) times daily.    [provider]    Family History Family History  Problem Relation Age of Onset  . Diabetes Mother   . Hypertension Mother     Social History Social History   Tobacco Use  . Smoking status: Current Every Day Smoker    Packs/day: 0.50    Types: Cigarettes  . Smokeless tobacco: Never Used  Substance Use Topics  . Alcohol use: Yes    Comment: occasionally  . Drug use: No     Allergies   Patient has no known allergies.   Review of Systems Review of Systems  Constitutional: Positive for fatigue. Negative for chills and fever.  HENT: Positive for sinus pressure. Negative for congestion, ear pain, rhinorrhea and sore throat.   Respiratory: Positive for cough, shortness of breath and wheezing.   Cardiovascular: Positive for chest pain and leg swelling (chronic).  Gastrointestinal: Negative for abdominal pain, nausea and vomiting.  Musculoskeletal: Positive for back pain (chronic).  Neurological: Positive for light-headedness and headaches. Negative for syncope.     Physical Exam Updated Vital Signs BP (!) 111/50 (BP Location: Right Arm)   Pulse 91   Temp 99.1 F (37.3 C) (Oral)   Resp 20   Ht 5\' 1"  (1.549 m)   Wt (!) 160.6 kg (354 lb)   LMP 12/23/2016   SpO2 95%   BMI 66.89 kg/m   Physical Exam  Constitutional: She is oriented to person, place, and time. She appears well-developed and well-nourished. No distress.  Obese  HENT:  Head: Normocephalic and atraumatic.  Right Ear: Hearing, tympanic membrane, external  ear and ear canal normal.  Left Ear: Hearing, tympanic membrane, external ear and ear canal normal.  Nose: Mucosal edema and rhinorrhea present.  Mouth/Throat: Uvula is midline, oropharynx is clear and moist and mucous membranes are normal.  Transverse nasal crease  Eyes: Conjunctivae are normal. Pupils are equal, round, and reactive to light. Right eye exhibits no discharge. Left eye exhibits no discharge. No scleral icterus.  Neck: Normal range of motion.  Cardiovascular: Normal rate and regular rhythm. Exam reveals no gallop and no friction rub.  No murmur heard. Pulmonary/Chest: Effort normal. No respiratory distress. She has  wheezes (expiratory wheezes in all lung fields).  Abdominal: She exhibits no distension.  Musculoskeletal:  2+ peripheral edema bilaterally  Neurological: She is alert and oriented to person, place, and time.  Skin: Skin is warm and dry.  Psychiatric: She has a normal mood and affect. Her behavior is normal.  Nursing note and vitals reviewed.    ED Treatments / Results  Labs (all labs ordered are listed, but only abnormal results are displayed) Labs Reviewed  BASIC METABOLIC PANEL - Abnormal; Notable for the following components:      Result Value   Chloride 99 (*)    Glucose, Bld 138 (*)    Calcium 8.6 (*)    All other components within normal limits  CBC - Abnormal; Notable for the following components:   WBC 12.7 (*)    Hemoglobin 9.9 (*)    HCT 33.7 (*)    MCV 73.9 (*)    MCH 21.7 (*)    MCHC 29.4 (*)    RDW 18.8 (*)    All other components within normal limits  BRAIN NATRIURETIC PEPTIDE  D-DIMER, QUANTITATIVE (NOT AT [facility])  I-STAT TROPONIN, ED  I-STAT BETA HCG BLOOD, ED ([facility], [facility], [facility] ONLY)    EKG  EKG Interpretation  Date/Time:  Tuesday December 24 2016 10:59:43 EST Ventricular Rate:  101 PR Interval:  136 QRS Duration: 96 QT Interval:  376 QTC Calculation: 487 R Axis:   65 Text Interpretation:  Sinus tachycardia Otherwise normal ECG  tachycardia new otherwise similar to previous Confirmed by Frederick PeersLittle, Rachel [phone]([id number]) on 12/24/2016 3:32:52 PM       Radiology Dg Chest 2 View  Result Date: 12/24/2016 CLINICAL DATA:  Shortness of breath. Cough. CHF. Hypertension. Ex-smoker. EXAM: CHEST  2 VIEW COMPARISON:  12/05/2016 FINDINGS: Convex right thoracic spine curvature, status post thoracolumbar spine fixation. Midline trachea. Borderline cardiomegaly. Mediastinal contours otherwise within normal limits. No pleural effusion or pneumothorax. Pulmonary interstitial prominence is favored to be due to low lung volumes and patient body habitus. No overt congestive failure or lobar consolidation. IMPRESSION: No acute cardiopulmonary disease. Electronically Signed   By: Jeronimo GreavesKyle  Talbot M.D.   On: 12/24/2016 11:51    Procedures Procedures (including critical care time)  Medications Ordered in ED Medications  albuterol (PROVENTIL) (2.5 MG/3ML) 0.083% nebulizer solution 5 mg (5 mg Nebulization Given 12/24/16 1123)  ipratropium-albuterol (DUONEB) 0.5-2.5 (3) MG/3ML nebulizer solution 3 mL (3 mLs Nebulization Given 12/24/16 1659)  ibuprofen (ADVIL,MOTRIN) tablet 600 mg (600 mg Oral Given 12/24/16 1804)  predniSONE (DELTASONE) tablet 40 mg (40 mg Oral Given 12/24/16 1900)  ipratropium-albuterol (DUONEB) 0.5-2.5 (3) MG/3ML nebulizer solution 3 mL (3 mLs Nebulization Given 12/24/16 1906)     Initial Impression / Assessment and Plan / ED Course  I have reviewed the triage vital signs and the nursing notes.  Pertinent labs & imaging results that were available during my care of the patient were reviewed by me and considered in my medical decision making (see chart for details).  36 year old female presents with SOB, cough, wheezing. She was tachycardic in triage but this has resolved. She is also hypertensive. Weight has increased throughout the month but is stable compared to yesterday. No hypoxia. She does have diffuse wheezing on exam. Most  likely she has acute bronchitis. CXR is negative. PE is a consideration and technically she is not PERC negative due to transient tachycardia but I think this is less likely. D-dimer is negative. She was given a duoneb Ambulatory O2  sats dropped to 89% and she became very tachypneic.   Shared visit with Dr. Clarene Duke. It looks like her prednisone is underdosed - she was given 40mg  of Prednisone in addition to the 20mg  she took earlier. She was given another breathing tx. Attempted to ambulate her in the hall again. Sats dropped to 87% and she continued to be short of breath. Will consult hospitalist for admission.  Final Clinical Impressions(s) / ED Diagnoses   Final diagnoses:  Moderate persistent asthma with exacerbation    ED Discharge Orders    None       Bethel Born, PA-C 12/24/16 2309    Little, Ambrose Finland, MD 01/01/17 2041

## 2016-12-24 NOTE — ED Notes (Signed)
Ambulated pt in hallway. Pt's O2 sats decreased to 89% while ambulating. Pt became very short of breath and tachypnic, RR -40

## 2016-12-24 NOTE — ED Triage Notes (Addendum)
Pt reports dx with chf, over weekend started feeling same sx of sob, cp. Went to doctor yesterday, given prednisone. Has been up all night, coughing, wheezing, using inhalers at home. Reports continued cp. Is a x 4. Not sure if this is her hf or respiratory virus?

## 2016-12-25 ENCOUNTER — Encounter (HOSPITAL_COMMUNITY): Payer: Self-pay | Admitting: Internal Medicine

## 2016-12-25 ENCOUNTER — Telehealth: Payer: Self-pay

## 2016-12-25 ENCOUNTER — Other Ambulatory Visit: Payer: Self-pay

## 2016-12-25 DIAGNOSIS — Z23 Encounter for immunization: Secondary | ICD-10-CM | POA: Diagnosis not present

## 2016-12-25 DIAGNOSIS — Z6841 Body Mass Index (BMI) 40.0 and over, adult: Secondary | ICD-10-CM | POA: Diagnosis not present

## 2016-12-25 DIAGNOSIS — F17210 Nicotine dependence, cigarettes, uncomplicated: Secondary | ICD-10-CM | POA: Diagnosis present

## 2016-12-25 DIAGNOSIS — D649 Anemia, unspecified: Secondary | ICD-10-CM | POA: Diagnosis not present

## 2016-12-25 DIAGNOSIS — J45901 Unspecified asthma with (acute) exacerbation: Secondary | ICD-10-CM

## 2016-12-25 DIAGNOSIS — I110 Hypertensive heart disease with heart failure: Secondary | ICD-10-CM | POA: Diagnosis present

## 2016-12-25 DIAGNOSIS — D72829 Elevated white blood cell count, unspecified: Secondary | ICD-10-CM | POA: Diagnosis not present

## 2016-12-25 DIAGNOSIS — R0603 Acute respiratory distress: Secondary | ICD-10-CM | POA: Diagnosis present

## 2016-12-25 DIAGNOSIS — Y92230 Patient room in hospital as the place of occurrence of the external cause: Secondary | ICD-10-CM | POA: Diagnosis not present

## 2016-12-25 DIAGNOSIS — F419 Anxiety disorder, unspecified: Secondary | ICD-10-CM | POA: Diagnosis present

## 2016-12-25 DIAGNOSIS — R0689 Other abnormalities of breathing: Secondary | ICD-10-CM | POA: Diagnosis present

## 2016-12-25 DIAGNOSIS — M549 Dorsalgia, unspecified: Secondary | ICD-10-CM | POA: Diagnosis present

## 2016-12-25 DIAGNOSIS — T380X5A Adverse effect of glucocorticoids and synthetic analogues, initial encounter: Secondary | ICD-10-CM | POA: Diagnosis not present

## 2016-12-25 DIAGNOSIS — J4541 Moderate persistent asthma with (acute) exacerbation: Secondary | ICD-10-CM | POA: Diagnosis present

## 2016-12-25 DIAGNOSIS — G4733 Obstructive sleep apnea (adult) (pediatric): Secondary | ICD-10-CM | POA: Diagnosis present

## 2016-12-25 DIAGNOSIS — I5032 Chronic diastolic (congestive) heart failure: Secondary | ICD-10-CM | POA: Diagnosis present

## 2016-12-25 DIAGNOSIS — J209 Acute bronchitis, unspecified: Secondary | ICD-10-CM | POA: Diagnosis present

## 2016-12-25 DIAGNOSIS — Z79899 Other long term (current) drug therapy: Secondary | ICD-10-CM | POA: Diagnosis not present

## 2016-12-25 DIAGNOSIS — F329 Major depressive disorder, single episode, unspecified: Secondary | ICD-10-CM | POA: Diagnosis present

## 2016-12-25 DIAGNOSIS — M419 Scoliosis, unspecified: Secondary | ICD-10-CM | POA: Diagnosis present

## 2016-12-25 DIAGNOSIS — G8929 Other chronic pain: Secondary | ICD-10-CM | POA: Diagnosis present

## 2016-12-25 DIAGNOSIS — R200 Anesthesia of skin: Secondary | ICD-10-CM | POA: Diagnosis present

## 2016-12-25 DIAGNOSIS — B9789 Other viral agents as the cause of diseases classified elsewhere: Secondary | ICD-10-CM | POA: Diagnosis present

## 2016-12-25 DIAGNOSIS — R0902 Hypoxemia: Secondary | ICD-10-CM | POA: Diagnosis present

## 2016-12-25 DIAGNOSIS — R402414 Glasgow coma scale score 13-15, 24 hours or more after hospital admission: Secondary | ICD-10-CM | POA: Diagnosis not present

## 2016-12-25 LAB — HIV ANTIBODY (ROUTINE TESTING W REFLEX): HIV SCREEN 4TH GENERATION: NONREACTIVE

## 2016-12-25 LAB — CBC
HCT: 33.3 % — ABNORMAL LOW (ref 36.0–46.0)
HEMOGLOBIN: 9.5 g/dL — AB (ref 12.0–15.0)
MCH: 21.1 pg — ABNORMAL LOW (ref 26.0–34.0)
MCHC: 28.5 g/dL — ABNORMAL LOW (ref 30.0–36.0)
MCV: 74 fL — AB (ref 78.0–100.0)
PLATELETS: 339 10*3/uL (ref 150–400)
RBC: 4.5 MIL/uL (ref 3.87–5.11)
RDW: 18.8 % — ABNORMAL HIGH (ref 11.5–15.5)
WBC: 15.7 10*3/uL — AB (ref 4.0–10.5)

## 2016-12-25 LAB — COMPREHENSIVE METABOLIC PANEL
ALT: 14 U/L (ref 14–54)
ANION GAP: 10 (ref 5–15)
AST: 17 U/L (ref 15–41)
Albumin: 3.2 g/dL — ABNORMAL LOW (ref 3.5–5.0)
Alkaline Phosphatase: 99 U/L (ref 38–126)
BUN: 9 mg/dL (ref 6–20)
CALCIUM: 9 mg/dL (ref 8.9–10.3)
CHLORIDE: 99 mmol/L — AB (ref 101–111)
CO2: 26 mmol/L (ref 22–32)
CREATININE: 0.68 mg/dL (ref 0.44–1.00)
Glucose, Bld: 137 mg/dL — ABNORMAL HIGH (ref 65–99)
Potassium: 4 mmol/L (ref 3.5–5.1)
SODIUM: 135 mmol/L (ref 135–145)
Total Bilirubin: 0.3 mg/dL (ref 0.3–1.2)
Total Protein: 7.4 g/dL (ref 6.5–8.1)

## 2016-12-25 LAB — RESPIRATORY PANEL BY PCR
ADENOVIRUS-RVPPCR: NOT DETECTED
BORDETELLA PERTUSSIS-RVPCR: NOT DETECTED
CHLAMYDOPHILA PNEUMONIAE-RVPPCR: NOT DETECTED
CORONAVIRUS NL63-RVPPCR: NOT DETECTED
Coronavirus 229E: NOT DETECTED
Coronavirus HKU1: NOT DETECTED
Coronavirus OC43: NOT DETECTED
INFLUENZA B-RVPPCR: NOT DETECTED
Influenza A: NOT DETECTED
METAPNEUMOVIRUS-RVPPCR: NOT DETECTED
Mycoplasma pneumoniae: NOT DETECTED
PARAINFLUENZA VIRUS 2-RVPPCR: NOT DETECTED
PARAINFLUENZA VIRUS 3-RVPPCR: NOT DETECTED
PARAINFLUENZA VIRUS 4-RVPPCR: NOT DETECTED
Parainfluenza Virus 1: NOT DETECTED
Respiratory Syncytial Virus: NOT DETECTED
Rhinovirus / Enterovirus: DETECTED — AB

## 2016-12-25 LAB — IRON AND TIBC
IRON: 26 ug/dL — AB (ref 28–170)
SATURATION RATIOS: 6 % — AB (ref 10.4–31.8)
TIBC: 423 ug/dL (ref 250–450)
UIBC: 397 ug/dL

## 2016-12-25 LAB — FERRITIN: Ferritin: 8 ng/mL — ABNORMAL LOW (ref 11–307)

## 2016-12-25 LAB — TSH: TSH: 0.139 u[IU]/mL — AB (ref 0.350–4.500)

## 2016-12-25 LAB — VITAMIN B12: Vitamin B-12: 255 pg/mL (ref 180–914)

## 2016-12-25 LAB — TROPONIN I: Troponin I: <0.03 ng/mL (ref <0.03)

## 2016-12-25 MED ORDER — ARIPIPRAZOLE 2 MG PO TABS
2.0000 mg | ORAL_TABLET | Freq: Every day | ORAL | Status: DC
  Administered 2016-12-25 – 2016-12-26 (×2): 2 mg via ORAL
  Filled 2016-12-25 (×2): qty 1

## 2016-12-25 MED ORDER — GABAPENTIN 100 MG PO CAPS
100.0000 mg | ORAL_CAPSULE | Freq: Three times a day (TID) | ORAL | Status: DC
  Administered 2016-12-25 – 2016-12-26 (×4): 100 mg via ORAL
  Filled 2016-12-25 (×4): qty 1

## 2016-12-25 MED ORDER — ALBUTEROL SULFATE (2.5 MG/3ML) 0.083% IN NEBU: 2.5000 mg | INHALATION_SOLUTION | Freq: Four times a day (QID) | RESPIRATORY_TRACT | Status: DC | PRN

## 2016-12-25 MED ORDER — ESCITALOPRAM OXALATE 20 MG PO TABS
20.0000 mg | ORAL_TABLET | Freq: Every day | ORAL | Status: DC
  Administered 2016-12-25 – 2016-12-26 (×2): 20 mg via ORAL
  Filled 2016-12-25 (×2): qty 1

## 2016-12-25 MED ORDER — FLUTICASONE FUROATE-VILANTEROL 200-25 MCG/INH IN AEPB
1.0000 | INHALATION_SPRAY | Freq: Every day | RESPIRATORY_TRACT | Status: DC
  Administered 2016-12-25 – 2016-12-26 (×2): 1 via RESPIRATORY_TRACT
  Filled 2016-12-25 (×2): qty 28

## 2016-12-25 MED ORDER — DILTIAZEM HCL 60 MG PO TABS
120.0000 mg | ORAL_TABLET | Freq: Every day | ORAL | Status: DC
  Administered 2016-12-25 – 2016-12-26 (×2): 120 mg via ORAL
  Filled 2016-12-25 (×2): qty 2

## 2016-12-25 MED ORDER — INFLUENZA VAC SPLIT HIGH-DOSE 0.5 ML IM SUSY
0.5000 mL | PREFILLED_SYRINGE | INTRAMUSCULAR | Status: AC
  Administered 2016-12-26: 0.5 mL via INTRAMUSCULAR
  Filled 2016-12-25: qty 0.5

## 2016-12-25 MED ORDER — IBUPROFEN 600 MG PO TABS: 600.0000 mg | ORAL_TABLET | Freq: Three times a day (TID) | ORAL | Status: DC | PRN

## 2016-12-25 MED ORDER — ALBUTEROL SULFATE (2.5 MG/3ML) 0.083% IN NEBU
2.5000 mg | INHALATION_SOLUTION | Freq: Four times a day (QID) | RESPIRATORY_TRACT | Status: DC
  Administered 2016-12-25 (×4): 2.5 mg via RESPIRATORY_TRACT
  Filled 2016-12-25 (×4): qty 3

## 2016-12-25 MED ORDER — METOPROLOL SUCCINATE ER 50 MG PO TB24
50.0000 mg | ORAL_TABLET | Freq: Every day | ORAL | Status: DC
Start: 2016-12-25 — End: 2016-12-26
  Administered 2016-12-25 – 2016-12-26 (×2): 50 mg via ORAL
  Filled 2016-12-25 (×2): qty 1

## 2016-12-25 MED ORDER — ZOLPIDEM TARTRATE 5 MG PO TABS
5.0000 mg | ORAL_TABLET | Freq: Every evening | ORAL | Status: DC | PRN
  Administered 2016-12-25: 5 mg via ORAL
  Filled 2016-12-25: qty 1

## 2016-12-25 MED ORDER — ALPRAZOLAM 0.5 MG PO TABS
0.5000 mg | ORAL_TABLET | Freq: Every day | ORAL | Status: DC | PRN
  Administered 2016-12-25 – 2016-12-26 (×2): 0.5 mg via ORAL
  Filled 2016-12-25 (×2): qty 1

## 2016-12-25 MED ORDER — TRAMADOL HCL 50 MG PO TABS
50.0000 mg | ORAL_TABLET | Freq: Four times a day (QID) | ORAL | Status: DC | PRN
  Administered 2016-12-25 – 2016-12-26 (×3): 50 mg via ORAL
  Filled 2016-12-25 (×4): qty 1

## 2016-12-25 MED ORDER — ENOXAPARIN SODIUM 80 MG/0.8ML ~~LOC~~ SOLN
80.0000 mg | SUBCUTANEOUS | Status: DC
  Administered 2016-12-26: 80 mg via SUBCUTANEOUS
  Filled 2016-12-25: qty 0.8

## 2016-12-25 MED ORDER — LEVOFLOXACIN IN D5W 500 MG/100ML IV SOLN
500.0000 mg | INTRAVENOUS | Status: DC
  Administered 2016-12-25 – 2016-12-26 (×2): 500 mg via INTRAVENOUS
  Filled 2016-12-25 (×2): qty 100

## 2016-12-25 MED ORDER — PNEUMOCOCCAL VAC POLYVALENT 25 MCG/0.5ML IJ INJ
0.5000 mL | INJECTION | INTRAMUSCULAR | Status: AC
  Administered 2016-12-26: 0.5 mL via INTRAMUSCULAR
  Filled 2016-12-25: qty 0.5

## 2016-12-25 MED ORDER — BUMETANIDE 2 MG PO TABS
4.0000 mg | ORAL_TABLET | Freq: Every day | ORAL | Status: DC
  Administered 2016-12-25 – 2016-12-26 (×2): 4 mg via ORAL
  Filled 2016-12-25 (×2): qty 2

## 2016-12-25 MED ORDER — POTASSIUM CHLORIDE CRYS ER 20 MEQ PO TBCR
40.0000 meq | EXTENDED_RELEASE_TABLET | Freq: Two times a day (BID) | ORAL | Status: DC
  Administered 2016-12-25 – 2016-12-26 (×3): 40 meq via ORAL
  Filled 2016-12-25 (×3): qty 2

## 2016-12-25 MED ORDER — BUMETANIDE 2 MG PO TABS
2.0000 mg | ORAL_TABLET | Freq: Every day | ORAL | Status: DC
  Administered 2016-12-25: 2 mg via ORAL
  Filled 2016-12-25 (×2): qty 1

## 2016-12-25 MED ORDER — LOSARTAN POTASSIUM 25 MG PO TABS
25.0000 mg | ORAL_TABLET | Freq: Every day | ORAL | Status: DC
  Administered 2016-12-25 – 2016-12-26 (×2): 25 mg via ORAL
  Filled 2016-12-25 (×2): qty 1

## 2016-12-25 MED ORDER — MONTELUKAST SODIUM 10 MG PO TABS
10.0000 mg | ORAL_TABLET | Freq: Every day | ORAL | Status: DC
  Administered 2016-12-25 (×2): 10 mg via ORAL
  Filled 2016-12-25 (×2): qty 1

## 2016-12-25 MED ORDER — ENOXAPARIN SODIUM 40 MG/0.4ML ~~LOC~~ SOLN
40.0000 mg | SUBCUTANEOUS | Status: DC
  Administered 2016-12-25: 40 mg via SUBCUTANEOUS
  Filled 2016-12-25: qty 0.4

## 2016-12-25 MED ORDER — METHYLPREDNISOLONE SODIUM SUCC 125 MG IJ SOLR
80.0000 mg | Freq: Three times a day (TID) | INTRAMUSCULAR | Status: DC
  Administered 2016-12-25 – 2016-12-26 (×4): 80 mg via INTRAVENOUS
  Filled 2016-12-25 (×4): qty 2

## 2016-12-25 MED ORDER — BUMETANIDE 2 MG PO TABS: 2.0000 mg | ORAL_TABLET | ORAL | Status: DC

## 2016-12-25 MED ORDER — TIOTROPIUM BROMIDE MONOHYDRATE 18 MCG IN CAPS
18.0000 ug | ORAL_CAPSULE | Freq: Every day | RESPIRATORY_TRACT | Status: DC
  Administered 2016-12-25 – 2016-12-26 (×2): 18 ug via RESPIRATORY_TRACT
  Filled 2016-12-25: qty 5

## 2016-12-25 NOTE — Progress Notes (Signed)
Place patient on CPAP for the night.  Patient is tolerating well at this time. 

## 2016-12-25 NOTE — Progress Notes (Signed)
Patient seen and examined, admitted by Dr. Selena BattenKim this morning.  Briefly 36 year old female with OSA on CPAP, CHF EF of 65-70%, diastolic, asthma presented with dyspnea for past 3 days with wheezing. Patient was admitted for asthma exacerbation  BP (!) 137/98 (BP Location: Left Arm)   Pulse 93   Temp 97.8 F (36.6 C) (Oral)   Resp 18   Ht 5\' 1"  (1.549 m)   Wt (!) 161.3 kg (355 lb 8 oz)   LMP 12/23/2016   SpO2 95%   BMI 67.17 kg/m   1) acute asthma exacerbation -Continue IV Solu-Medrol, scheduled nebs, Levaquin -Respiratory virus panel positive for rhinovirus/enterovirus -Troponins negative  Improving slowly, possible DC in am   Mendy Chou M.D. [facility] 12/25/2016, 10:24 AM  Pager: [phone]

## 2016-12-25 NOTE — H&P (Addendum)
TR[facility] H&P   Patient Demographics:    Karene Bracken, is a 36 y.o. female  MRN: [id number]   DOB - 12-25-1980  Admit Date - 12/24/2016  Outpatient Primary MD for the patient is Kandyce Rud, MD  Referring MD/NP/PA:  Terance Hart  Outpatient Specialists:   Patient coming from: home  Chief Complaint  Patient presents with  . Shortness of Breath      HPI:    Peighton Mehra  is a 36 y.o. female, w hx of Osa, on cpap, CHF (EF 65-70%), Asthma, apparently c/o dyspnea for the past 3 days.  Slight wheezing, brown sputum.  Denies fever, chills, cp, palp, n/v, diarrhea, brbpr, black stool.  Pt apparently seen at [facility] ER 11/8 and tx with some prednisone, but has not improved to baseline. Pt was concerned that her pox drops when she walks. Down to 88%.    In ED,  CXR  IMPRESSION: No acute cardiopulmonary disease.  Na 137, K 3.5, Bun 8, Creatinine 0.71 Wbc 12.7, Hgb 9.9, Plt 304 Trop 0.00  BNP 15.3 D dimer negative  Pt will be admitted for bronchitis and asthma exacerbation.    Review of systems:    In addition to the HPI above,    No Fever-chills, No Headache, No changes with Vision or hearing, No problems swallowing food or Liquids, No Chest pain, No Abdominal pain, No Nausea or Vommitting, Bowel movements are regular, No Blood in stool or Urine, No dysuria, No new skin rashes or bruises, No new joints pains-aches,  No new weakness, tingling, numbness in any extremity, No recent weight gain or loss, No polyuria, polydypsia or polyphagia, No significant Mental Stressors.  A full 10 point Review of Systems was done, except as stated above, all other Review of Systems were negative.   With Past History of the following :    Past Medical History:  Diagnosis Date  . Anxiety   . CHF (congestive heart failure) (HCC)   . Chronic heart failure (HCC)   . Morbid  obesity (HCC)   . OSA (obstructive sleep apnea)    uses CPAP at home  . Peripheral edema   . Scoliosis       Past Surgical History:  Procedure Laterality Date  . HERNIA REPAIR    . scoliosis repair    . TUBAL LIGATION        Social History:     Social History   Tobacco Use  . Smoking status: Current Every Day Smoker    Packs/day: 0.50    Types: Cigarettes  . Smokeless tobacco: Never Used  Substance Use Topics  . Alcohol use: Yes    Comment: occasionally     Lives - at home,   Mobility -  Walks by self  Family History :     Family History  Problem Relation Age of Onset  . Diabetes Mother   .  Hypertension Mother       Home Medications:   Prior to Admission medications   Medication Sig Start Date End Date Taking? Authorizing Provider  albuterol (PROVENTIL HFA;VENTOLIN HFA) 108 (90 Base) MCG/ACT inhaler Inhale 2-4 puffs by mouth every 4 hours as needed for wheezing, cough, and/or shortness of breath 05/12/16  Yes Loleta RoseForbach, Cory, MD  ALPRAZolam Prudy Feeler(XANAX) 0.5 MG tablet Take 1 tablet by mouth daily as needed for anxiety.  11/14/15  Yes [provider]  ARIPiprazole (ABILIFY) 2 MG tablet Take 2 mg daily by mouth.   Yes [provider]  beclomethasone (QVAR) 40 MCG/ACT inhaler Inhale 1 puff into the lungs 2 (two) times daily. 10/20/15  Yes Wieting, Richard, MD  bumetanide (BUMEX) 2 MG tablet Take 2-4 mg by mouth See admin instructions. 4 mg in the AM and 2 mg in the PM   Yes [provider]  diclofenac sodium (VOLTAREN) 1 % GEL Apply 4 (four) times daily topically.   Yes [provider]  diltiazem (CARDIZEM) 120 MG tablet Take 120 mg daily by mouth.   Yes [provider]  escitalopram (LEXAPRO) 20 MG tablet Take 20 mg by mouth daily.    Yes [provider]  gabapentin (NEURONTIN) 100 MG capsule Take 100-200 mg by mouth 3 (three) times daily.   Yes [provider]  lidocaine (LIDODERM) 5 % Place 1 patch daily  onto the skin. Remove & Discard patch within 12 hours or as directed by MD   Yes [provider]  losartan (COZAAR) 25 MG tablet Take 25 mg by mouth daily.   Yes [provider]  metoprolol succinate (TOPROL-XL) 25 MG 24 hr tablet Take 2 tablets daily by mouth.  10/30/15  Yes [provider]  montelukast (SINGULAIR) 10 MG tablet Take 10 mg by mouth at bedtime.   Yes [provider]  potassium chloride SA (K-DUR,KLOR-CON) 20 MEQ tablet Take 40 mEq by mouth 2 (two) times daily.   Yes [provider]  predniSONE (DELTASONE) 10 MG tablet Take 10-20 mg by mouth See admin instructions. 20mg  daily x 5 days Then, 10mg  daily x 5 days 12/23/16  Yes [provider]     Allergies:    No Known Allergies   Physical Exam:   Vitals  Blood pressure 105/60, pulse 91, temperature 98.3 F (36.8 C), temperature source Oral, resp. rate 19, height 5\' 1"  (1.549 m), weight (!) 158.4 kg (349 lb 5 oz), last menstrual period 12/23/2016, SpO2 96 %.   1. General  lying in bed in NAD,    2. Normal affect and insight, Not Suicidal or Homicidal, Awake Alert, Oriented X 3.  3. No F.N deficits, ALL C.Nerves Intact, Strength 5/5 all 4 extremities, Sensation intact all 4 extremities, Plantars down going.  4. Ears and Eyes appear Normal, Conjunctivae clear, PERRLA. Moist Oral Mucosa.  5. Supple Neck, No JVD, No cervical lymphadenopathy appriciated, No Carotid Bruits.  6. Symmetrical Chest wall movement, slightly tight , +  Bilateral exp wheezing, no crackles.   7. RRR, No Gallops, Rubs or Murmurs, No Parasternal Heave.  8. Positive Bowel Sounds, Abdomen Soft, No tenderness, No organomegaly appriciated,No rebound -guarding or rigidity.  9.  No Cyanosis, Normal Skin Turgor, No Skin Rash or Bruise.  10. Good muscle tone,  joints appear normal , no effusions, Normal ROM.  11. No Palpable Lymph Nodes in Neck or Axillae     Data Review:    CBC Recent Labs  Lab  12/24/16 1128  WBC 12.7*  HGB 9.9*  HCT 33.7*  PLT 304  MCV 73.9*  MCH 21.7*  MCHC 29.4*  RDW 18.8*   ------------------------------------------------------------------------------------------------------------------  Chemistries  Recent Labs  Lab 12/24/16 1128  NA 137  K 3.5  CL 99*  CO2 27  GLUCOSE 138*  BUN 8  CREATININE 0.71  CALCIUM 8.6*   ------------------------------------------------------------------------------------------------------------------ estimated creatinine clearance is 141.2 mL/min (by C-G formula based on SCr of 0.71 mg/dL). ------------------------------------------------------------------------------------------------------------------ No results for input(s): TSH, T4TOTAL, T3FREE, THYROIDAB in the last 72 hours.  Invalid input(s): FREET3  Coagulation profile No results for input(s): INR, PROTIME in the last 168 hours. ------------------------------------------------------------------------------------------------------------------- Recent Labs    12/24/16 1907  DDIMER <0.27   -------------------------------------------------------------------------------------------------------------------  Cardiac Enzymes No results for input(s): CKMB, TROPONINI, MYOGLOBIN in the last 168 hours.  Invalid input(s): CK ------------------------------------------------------------------------------------------------------------------    Component Value Date/Time   BNP 15.3 12/24/2016 1644     ---------------------------------------------------------------------------------------------------------------  Urinalysis    Component Value Date/Time   COLORURINE YELLOW (A) 12/19/2014 2029   APPEARANCEUR HAZY (A) 12/19/2014 2029   LABSPEC 1.013 12/19/2014 2029   PHURINE 6.0 12/19/2014 2029   GLUCOSEU NEGATIVE 12/19/2014 2029   HGBUR NEGATIVE 12/19/2014 2029   BILIRUBINUR NEGATIVE 12/19/2014 2029   KETONESUR NEGATIVE 12/19/2014 2029   PROTEINUR NEGATIVE  12/19/2014 2029   NITRITE NEGATIVE 12/19/2014 2029   LEUKOCYTESUR NEGATIVE 12/19/2014 2029    ----------------------------------------------------------------------------------------------------------------   Imaging Results:    Dg Chest 2 View  Result Date: 12/24/2016 CLINICAL DATA:  Shortness of breath. Cough. CHF. Hypertension. Ex-smoker. EXAM: CHEST  2 VIEW COMPARISON:  12/05/2016 FINDINGS: Convex right thoracic spine curvature, status post thoracolumbar spine fixation. Midline trachea. Borderline cardiomegaly. Mediastinal contours otherwise within normal limits. No pleural effusion or pneumothorax. Pulmonary interstitial prominence is favored to be due to low lung volumes and patient body habitus. No overt congestive failure or lobar consolidation. IMPRESSION: No acute cardiopulmonary disease. Electronically Signed   By: Jeronimo GreavesKyle  Talbot M.D.   On: 12/24/2016 11:51      Assessment & Plan:    Principal Problem:   Asthma exacerbation Active Problems:   Anemia    Asthma exacerbation, Bronchitis resp virus panel Solumedrol 80mg  iv q8h spiriva 1puff qday Qvar=> Breo 1puff qday Albuterol neb 1 po q6h + q6h prn  Levaquin 500mg  iv qday  OSA  Cont cpap  Tachycardia Tele Trop I q6h x3  Anemia Check cbc in am Check ferritin, iron, tibc, b12, folate, tsh Consider spep, upep  Hypertension Cont cardizem Cont toprol xl  CHF Cont toprol XL Cont bumex Cont potassium Cont losartan  Anxiety Cont xanax Cont lexapro Cont abilify     DVT Prophylaxis Lovenox- SCDs    AM Labs Ordered, also please review Full Orders  Family Communication: Admission, patients condition and plan of care including tests being ordered have been discussed with the patient  who indicate understanding and agree with the plan and Code Status.  Code Status FULL CODE  Likely DC to  home  Condition GUARDED   Consults called: none  Admission status: observation   Time spent in minutes :  45   Pearson GrippeJames Arielle Eber M.D on 12/25/2016 at 12:37 AM  Between 7am to 7pm - Pager - [phone]   After 7pm go to [email or web address] - password [email or web address]  [facility] - Office  [phone]

## 2016-12-25 NOTE — Progress Notes (Signed)
RT placed pt on CPAP with M/L nasal mask at a pressure of 4.5. PT states that 5 is too much air and 4 is not enough. Pt resting comfortably. RT to cont to monitor.

## 2016-12-25 NOTE — Progress Notes (Signed)
Received report.  Room ready.   

## 2016-12-25 NOTE — Progress Notes (Signed)
New Admission Note:  Arrival Method: Stretcher Mental Orientation: Alert and oriented x 4 Telemetry: Box 13 NSR Assessment: Completed Skin: Warm and dry.  IV: NSL  Pain: 5/10 back lower Tubes: N/A Safety Measures: Safety Fall Prevention Plan initiated.  Admission: Completed 5 M Orientation: Patient has been orientated to the room, unit and the staff. Family: None   Orders have been reviewed and implemented. Will continue to monitor the patient. Call light has been placed within reach and bed alarm has been activated.   [place]ilford ShiEmmanuel Linkin White BSN, RN  Phone Number: [phone]

## 2016-12-26 DIAGNOSIS — F419 Anxiety disorder, unspecified: Secondary | ICD-10-CM

## 2016-12-26 DIAGNOSIS — D649 Anemia, unspecified: Secondary | ICD-10-CM

## 2016-12-26 DIAGNOSIS — G4733 Obstructive sleep apnea (adult) (pediatric): Secondary | ICD-10-CM

## 2016-12-26 DIAGNOSIS — J4541 Moderate persistent asthma with (acute) exacerbation: Principal | ICD-10-CM

## 2016-12-26 DIAGNOSIS — D72829 Elevated white blood cell count, unspecified: Secondary | ICD-10-CM

## 2016-12-26 DIAGNOSIS — F329 Major depressive disorder, single episode, unspecified: Secondary | ICD-10-CM

## 2016-12-26 LAB — FOLATE RBC
FOLATE, HEMOLYSATE: 444.7 ng/mL
Folate, RBC: 1449 ng/mL (ref >498)
HEMATOCRIT: 30.7 % — AB (ref 34.0–46.6)

## 2016-12-26 MED ORDER — METHYLPREDNISOLONE SODIUM SUCC 125 MG IJ SOLR: 60.0000 mg | Freq: Two times a day (BID) | INTRAMUSCULAR | Status: DC

## 2016-12-26 MED ORDER — ALBUTEROL SULFATE (2.5 MG/3ML) 0.083% IN NEBU
2.5000 mg | INHALATION_SOLUTION | Freq: Two times a day (BID) | RESPIRATORY_TRACT | Status: DC
  Administered 2016-12-26: 2.5 mg via RESPIRATORY_TRACT
  Filled 2016-12-26: qty 3

## 2016-12-26 MED ORDER — PREDNISONE 10 MG PO TABS: ORAL_TABLET | ORAL | 0 refills | Status: DC

## 2016-12-26 NOTE — Discharge Instructions (Signed)

## 2016-12-26 NOTE — Progress Notes (Signed)
Walked with patient down the hall and back to her room:  Patient Saturations on [employer] Air at Rest = 98%  Patient Saturations on [employer] Air while Ambulating = 92%  Patient Saturations on- 0 Liters of oxygen while Ambulating = 92%  MD notified. Will continue to monitor.

## 2016-12-26 NOTE — Progress Notes (Signed)
Morene Rankinsose A Brannen to be D/C'd Home per MD order.  Discussed prescriptions and follow up appointments with the patient. Prescriptions given to patient, medication list explained in detail. Pt verbalized understanding.  Allergies as of 12/26/2016   No Known Allergies     Medication List    TAKE these medications   ABILIFY 2 MG tablet Generic drug:  ARIPiprazole Take 2 mg daily by mouth.   albuterol 108 (90 Base) MCG/ACT inhaler Commonly known as:  PROVENTIL HFA;VENTOLIN HFA Inhale 2-4 puffs by mouth every 4 hours as needed for wheezing, cough, and/or shortness of breath   ALPRAZolam 0.5 MG tablet Commonly known as:  XANAX Take 1 tablet by mouth daily as needed for anxiety.   beclomethasone 40 MCG/ACT inhaler Commonly known as:  QVAR Inhale 1 puff into the lungs 2 (two) times daily.   bumetanide 2 MG tablet Commonly known as:  BUMEX Take 2-4 mg by mouth See admin instructions. 4 mg in the AM and 2 mg in the PM   diltiazem 120 MG tablet Commonly known as:  CARDIZEM Take 120 mg daily by mouth.   escitalopram 20 MG tablet Commonly known as:  LEXAPRO Take 20 mg by mouth daily.   gabapentin 100 MG capsule Commonly known as:  NEURONTIN Take 100-200 mg by mouth 3 (three) times daily.   lidocaine 5 % Commonly known as:  LIDODERM Place 1 patch daily onto the skin. Remove & Discard patch within 12 hours or as directed by MD   losartan 25 MG tablet Commonly known as:  COZAAR Take 25 mg by mouth daily.   metoprolol succinate 25 MG 24 hr tablet Commonly known as:  TOPROL-XL Take 2 tablets daily by mouth.   montelukast 10 MG tablet Commonly known as:  SINGULAIR Take 10 mg by mouth at bedtime.   potassium chloride SA 20 MEQ tablet Commonly known as:  K-DUR,KLOR-CON Take 40 mEq by mouth 2 (two) times daily.   predniSONE 10 MG tablet Commonly known as:  DELTASONE Take 6 tabs x 3 days, then 5 tabs x 3 days, then 4 tabs x 3days, then 3 tabs x 3days, then 2 tabs x 3days, then 1 tab  x3 days. What changed:    how much to take  how to take this  when to take this  additional instructions   VOLTAREN 1 % Gel Generic drug:  diclofenac sodium Apply 4 (four) times daily topically.       Vitals:   12/26/16 0857 12/26/16 0954  BP:  138/66  Pulse:  99  Resp:  18  Temp:  98.6 F (37 C)  SpO2: 95% 98%    Skin clean, dry and intact without evidence of skin break down, no evidence of skin tears noted. IV catheter discontinued intact. Site without signs and symptoms of complications. Dressing and pressure applied. Pt denies pain at this time. No complaints noted.  An After Visit Summary was printed and given to the patient. Patient escorted via WC, and D/C home via private auto.  Cecil CobbsMolly Weismiller RN [facility] Phone [phone]

## 2016-12-26 NOTE — Discharge Summary (Signed)
Physician Discharge Summary  Tracey Tracey White A Tracey White WUJ:[id number] DOB: 1980-09-04 DOA: 12/24/2016  PCP: Kandyce RudBabaoff, Marcus, MD  Admit date: 12/24/2016 Discharge date: 12/26/2016  Time spent: 45 minutes  Recommendations for Outpatient Follow-up:  Patient will be discharged to home.  Patient will need to follow up with primary care provider within one week of discharge, repeat CBC.  Patient should continue medications as prescribed.  Patient should follow a heart healthy diet.   Discharge Diagnoses:  Asthma exacerbation/bronchitis secondary to Rhinovirus/Acute hypoxic respiratory insufficiency  Leukocytosis Chronic microcytic anemia  Obstructive sleep apnea Tachycardia Essential hypertension Chronic Congestive heart failure,suspect diastolic Anxiety/depression  Discharge Condition: Stable  Diet recommendation: heart healthy  Filed Weights   12/24/16 1102 12/24/16 1621 12/25/16 0057  Weight: (!) 160.6 kg (354 lb) (!) 158.4 kg (349 lb 5 oz) (!) 161.3 kg (355 lb 8 oz)    History of present illness:  On 12/25/2016 by Dr. Pearson GrippeJames Kim Brendolyn PattyRose White  is a 36 y.o. female, w hx of [place], on cpap, CHF (EF 65-70%), Asthma, apparently c/o dyspnea for the past 3 days.  Slight wheezing, brown sputum.  Denies fever, chills, cp, palp, n/v, diarrhea, brbpr, black stool.  Pt apparently seen [facility] 11/8 and tx with some prednisone, but has not improved to baseline. Pt was concerned that her pox drops when she walks. Down to 88%.    Hospital Course:  Asthma exacerbation/bronchitis secondary to Rhinovirus/Acute hypoxic respiratory insufficiency  -upon admission, patient was noted to have SpO2 of 88% with ambulation -Viral panel: +rhinovirus/enterovirus -was placed on solumedrol, levaquin, nebs -troponins cycled and negative -CXR unremarkable -patient improved quicker than expected -Able to ambulate today and maintain O2 saturations >90% on room air -will discharge with long prednisone  taper  Leukocytosis -Likely secondary to steroid -Repeat CBC in 1 week  Chronic microcytic anemia  -Appears to be stable  Obstructive sleep apnea -Continue CPAP  Tachycardia -Likely secondary to respiratory distress -Improved -Troponins negative  Essential hypertension -Continue Toprol and Cardizem  Chronic Congestive heart failure,suspect diastolic -Appears to be stable and compensated -Continue metoprolol, Bumex, losartan -echocardiogram 10/19/2015: EF 65-70%  Anxiety/depression -Continue Abilify, Lexapro, Xanax  Procedures: None  Consultations: None  Discharge Exam: Vitals:   12/26/16 0857 12/26/16 0954  BP:  138/66  Pulse:  99  Resp:  18  Temp:  98.6 F (37 C)  SpO2: 95% 98%   Feels breathing has improved. Continues to have some cough. Denies further wheezing. Denies chest pain, abdominal pain, N/V/D/C, headache, dizziness.    General: Well developed, well nourished, NAD, appears stated age  HEENT: NCAT,  mucous membranes moist.  Cardiovascular: S1 S2 auscultated, no rubs, murmurs or gallops. Regular rate and rhythm.  Respiratory: Clear to auscultation bilaterally with equal chest rise  Abdomen: Soft, obese, nontender, nondistended, + bowel sounds  Extremities: warm dry without cyanosis clubbing or edema  Neuro: AAOx3, nonfocal  Psych: Normal affect and demeanor with intact judgement and insight  Discharge Instructions Discharge Instructions    Discharge instructions   Complete by:  As directed    Patient will be discharged to home.  Patient will need to follow up with primary care provider within one week of discharge, repeat CBC.  Patient should continue medications as prescribed.  Patient should follow a heart healthy diet.      No Known Allergies Follow-up Information    Kandyce RudBabaoff, Marcus, MD. Schedule an appointment as soon as possible for a visit in 1 week(s).   Specialty:  Family  Medicine Why:  Hospital follow up Contact  information: [address] [facility] [place] [address] [phone]            The results of significant diagnostics from this hospitalization (including imaging, microbiology, ancillary and laboratory) are listed below for reference.    Significant Diagnostic Studies: Dg Chest 2 View  Result Date: 12/24/2016 CLINICAL DATA:  Shortness of breath. Cough. CHF. Hypertension. Ex-smoker. EXAM: CHEST  2 VIEW COMPARISON:  12/05/2016 FINDINGS: Convex right thoracic spine curvature, status post thoracolumbar spine fixation. Midline trachea. Borderline cardiomegaly. Mediastinal contours otherwise within normal limits. No pleural effusion or pneumothorax. Pulmonary interstitial prominence is favored to be due to low lung volumes and patient body habitus. No overt congestive failure or lobar consolidation. IMPRESSION: No acute cardiopulmonary disease. Electronically Signed   By: Jeronimo Greaves M.D.   On: 12/24/2016 11:51   Dg Chest 2 View  Result Date: 12/05/2016 CLINICAL DATA:  Shortness of breath and right-sided chest pain and cough. EXAM: CHEST  2 VIEW COMPARISON:  Chest x-ray of October 25, 2016 FINDINGS: The lungs are well-expanded. The interstitial markings are coarse though stable. The cardiac silhouette is enlarged. The pulmonary vascularity is engorged. The patient has undergone Harrington rod placement and posterior fusion procedures throughout the thoracic and upper lumbar spine for scoliosis. IMPRESSION: Chronic low-grade CHF with mild acute exacerbation. Underlying chronic bronchitic change. No acute pneumonia. Electronically Signed   By: David  Swaziland M.D.   On: 12/05/2016 11:52    Microbiology: Recent Results (from the past 240 hour(s))  Respiratory Panel by PCR     Status: Abnormal   Collection Time: 12/25/16  1:10 AM  Result Value Ref Range Status   Adenovirus NOT DETECTED NOT DETECTED Final   Coronavirus 229E NOT DETECTED NOT  DETECTED Final   Coronavirus HKU1 NOT DETECTED NOT DETECTED Final   Coronavirus NL63 NOT DETECTED NOT DETECTED Final   Coronavirus OC43 NOT DETECTED NOT DETECTED Final   Metapneumovirus NOT DETECTED NOT DETECTED Final   Rhinovirus / Enterovirus DETECTED (A) NOT DETECTED Final   Influenza A NOT DETECTED NOT DETECTED Final   Influenza B NOT DETECTED NOT DETECTED Final   Parainfluenza Virus 1 NOT DETECTED NOT DETECTED Final   Parainfluenza Virus 2 NOT DETECTED NOT DETECTED Final   Parainfluenza Virus 3 NOT DETECTED NOT DETECTED Final   Parainfluenza Virus 4 NOT DETECTED NOT DETECTED Final   Respiratory Syncytial Virus NOT DETECTED NOT DETECTED Final   Bordetella pertussis NOT DETECTED NOT DETECTED Final   Chlamydophila pneumoniae NOT DETECTED NOT DETECTED Final   Mycoplasma pneumoniae NOT DETECTED NOT DETECTED Final     Labs: Basic Metabolic Panel: Recent Labs  Lab 12/24/16 1128 12/25/16 0710  NA 137 135  K 3.5 4.0  CL 99* 99*  CO2 27 26  GLUCOSE 138* 137*  BUN 8 9  CREATININE 0.71 0.68  CALCIUM 8.6* 9.0   Liver Function Tests: Recent Labs  Lab 12/25/16 0710  AST 17  ALT 14  ALKPHOS 99  BILITOT 0.3  PROT 7.4  ALBUMIN 3.2*   No results for input(s): LIPASE, AMYLASE in the last 168 hours. No results for input(s): AMMONIA in the last 168 hours. CBC: Recent Labs  Lab 12/24/16 1128 12/25/16 0710  WBC 12.7* 15.7*  HGB 9.9* 9.5*  HCT 33.7* 33.3*  MCV 73.9* 74.0*  PLT 304 339   Cardiac Enzymes: Recent Labs  Lab 12/25/16 0153 12/25/16 0710 12/25/16 1354  TROPONINI <  0.03 <0.03 <0.03   BNP: BNP (last 3 results) Recent Labs    07/04/16 1755 07/17/16 2130 12/24/16 1644  BNP 14.0 23.0 15.3    ProBNP (last 3 results) No results for input(s): PROBNP in the last 8760 hours.  CBG: No results for input(s): GLUCAP in the last 168 hours.     Signed:  Edsel PetrinMaryann Eliani Leclere  [facility] 12/26/2016, 12:45 PM

## 2016-12-30 ENCOUNTER — Emergency Department: Payer: Medicaid Other

## 2016-12-30 ENCOUNTER — Encounter: Payer: Self-pay | Admitting: Emergency Medicine

## 2016-12-30 ENCOUNTER — Emergency Department
Admission: EM | Admit: 2016-12-30 | Discharge: 2016-12-30 | Disposition: A | Discharge status: Home / Self Care | Payer: Medicaid Other | Attending: Emergency Medicine | Admitting: Emergency Medicine

## 2016-12-30 DIAGNOSIS — Z79899 Other long term (current) drug therapy: Secondary | ICD-10-CM | POA: Diagnosis not present

## 2016-12-30 DIAGNOSIS — R55 Syncope and collapse: Secondary | ICD-10-CM | POA: Diagnosis not present

## 2016-12-30 DIAGNOSIS — I5032 Chronic diastolic (congestive) heart failure: Secondary | ICD-10-CM | POA: Diagnosis not present

## 2016-12-30 DIAGNOSIS — F17210 Nicotine dependence, cigarettes, uncomplicated: Secondary | ICD-10-CM | POA: Diagnosis not present

## 2016-12-30 DIAGNOSIS — Z791 Long term (current) use of non-steroidal anti-inflammatories (NSAID): Secondary | ICD-10-CM | POA: Insufficient documentation

## 2016-12-30 DIAGNOSIS — G8929 Other chronic pain: Secondary | ICD-10-CM | POA: Diagnosis not present

## 2016-12-30 LAB — COMPREHENSIVE METABOLIC PANEL
ALK PHOS: 87 U/L (ref 38–126)
ALT: 13 U/L — AB (ref 14–54)
AST: 17 U/L (ref 15–41)
Albumin: 3.3 g/dL — ABNORMAL LOW (ref 3.5–5.0)
Anion gap: 8 (ref 5–15)
BUN: 18 mg/dL (ref 6–20)
CALCIUM: 8.9 mg/dL (ref 8.9–10.3)
CO2: 32 mmol/L (ref 22–32)
CREATININE: 0.68 mg/dL (ref 0.44–1.00)
Chloride: 96 mmol/L — ABNORMAL LOW (ref 101–111)
GFR calc non Af Amer: >60 mL/min (ref >60)
GLUCOSE: 91 mg/dL (ref 65–99)
Potassium: 3.4 mmol/L — ABNORMAL LOW (ref 3.5–5.1)
Sodium: 136 mmol/L (ref 135–145)
Total Bilirubin: 0.5 mg/dL (ref 0.3–1.2)
Total Protein: 7.3 g/dL (ref 6.5–8.1)

## 2016-12-30 LAB — CBC
HEMATOCRIT: 34.7 % — AB (ref 35.0–47.0)
HEMOGLOBIN: 10.8 g/dL — AB (ref 12.0–16.0)
MCH: 22 pg — ABNORMAL LOW (ref 26.0–34.0)
MCHC: 31.1 g/dL — AB (ref 32.0–36.0)
MCV: 70.7 fL — ABNORMAL LOW (ref 80.0–100.0)
Platelets: 313 10*3/uL (ref 150–440)
RBC: 4.92 MIL/uL (ref 3.80–5.20)
RDW: 19.6 % — ABNORMAL HIGH (ref 11.5–14.5)
WBC: 16.9 10*3/uL — ABNORMAL HIGH (ref 3.6–11.0)

## 2016-12-30 LAB — FIBRIN DERIVATIVES D-DIMER (ARMC ONLY): FIBRIN DERIVATIVES D-DIMER (ARMC): 256.92 ng{FEU}/mL (ref 0.00–499.00)

## 2016-12-30 LAB — HCG, QUANTITATIVE, PREGNANCY: hCG, Beta Chain, Quant, S: <1 m[IU]/mL (ref <5)

## 2016-12-30 LAB — BRAIN NATRIURETIC PEPTIDE: B Natriuretic Peptide: 29 pg/mL (ref 0.0–100.0)

## 2016-12-30 LAB — TROPONIN I: Troponin I: <0.03 ng/mL (ref <0.03)

## 2016-12-30 IMAGING — DX DG CHEST 1V PORT
1 series · 1 of 1 positions shown · non-contrast
Comparison: Portable exam [DQ] hours compared to [DATE]

CLINICAL DATA: Syncopal episode in the shower 1 hour prior to
arrival, history CHF, bronchitis, chronic heart failure, smoker

EXAM:
PORTABLE CHEST 1 VIEW

[chest ap]
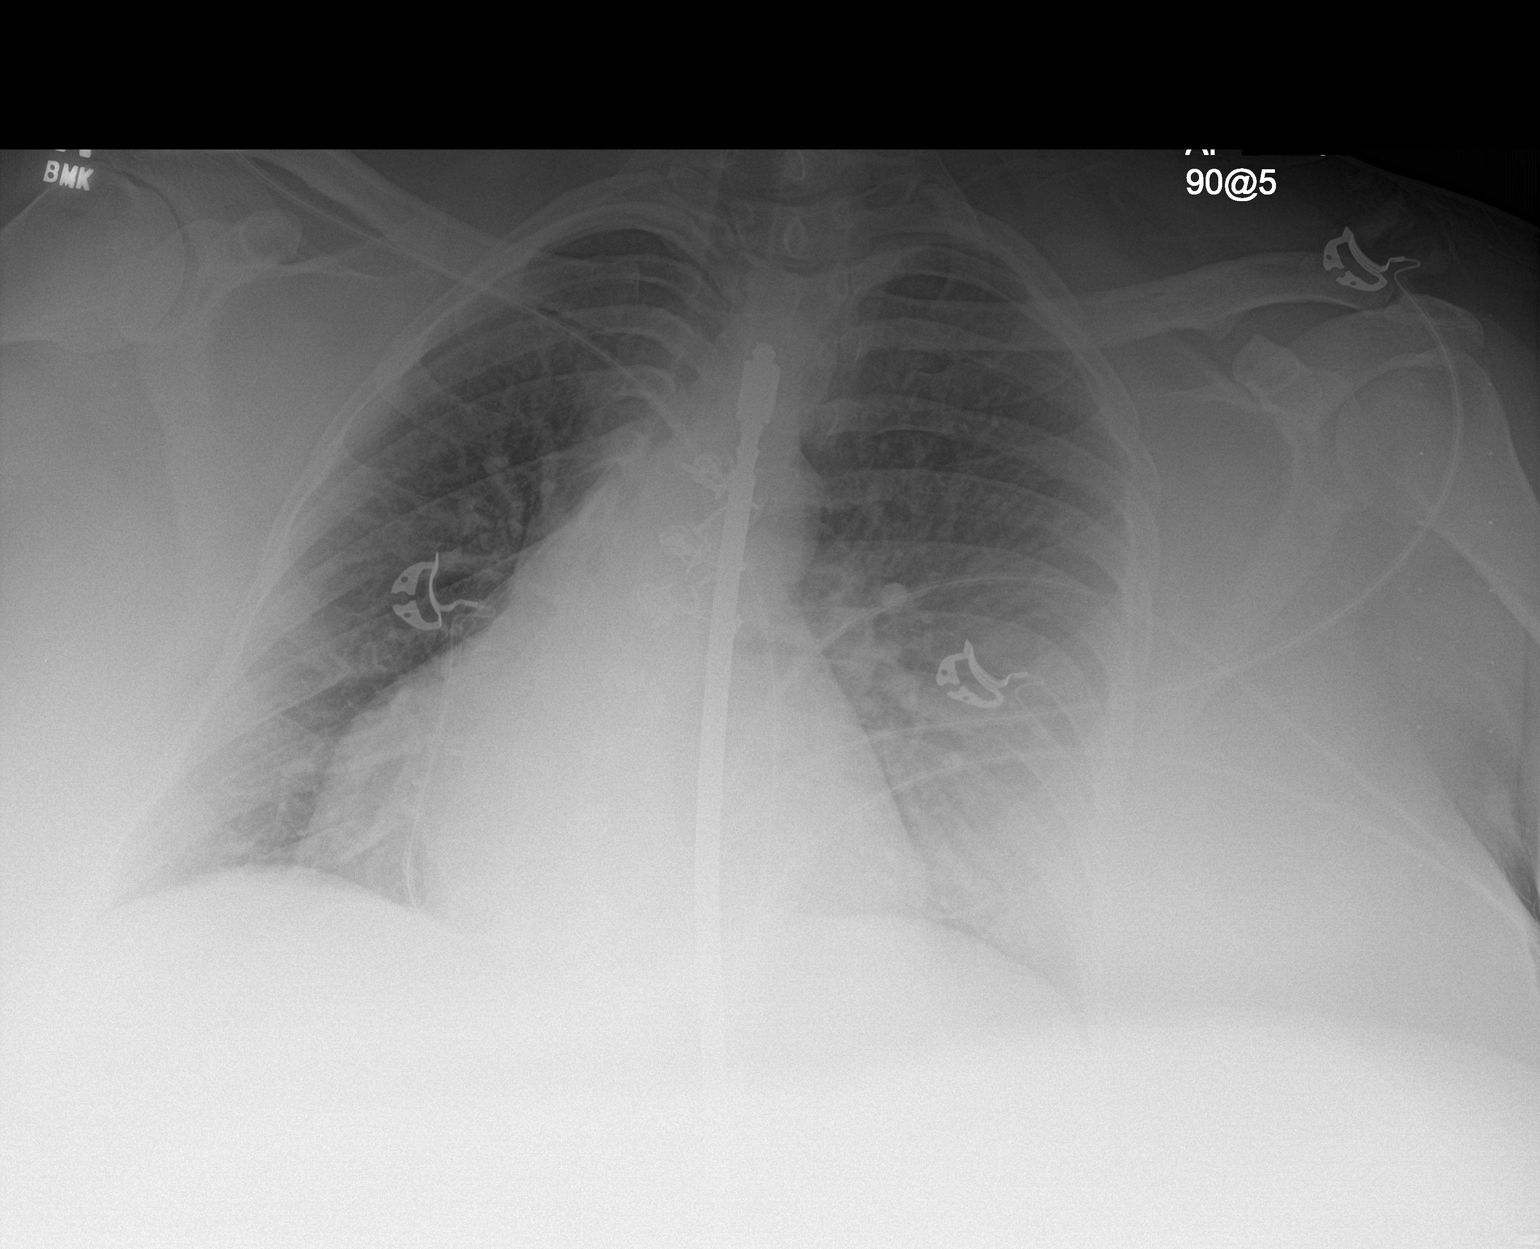

[1 of 1 positions shown; findings below may reference images not displayed]

FINDINGS: Enlargement of cardiac silhouette post median sternotomy and CABG.

Slight vascular congestion.

Stable mediastinal contours.

Lungs clear.

No pleural effusion or pneumothorax.

Dextroconvex thoracic scoliosis with prior spinal fixation surgery.
IMPRESSION: Enlargement of cardiac silhouette with pulmonary vascular
congestion.

No definite acute infiltrate.

## 2016-12-30 MED ORDER — IBUPROFEN 600 MG PO TABS
600.0000 mg | ORAL_TABLET | Freq: Once | ORAL | Status: AC
  Administered 2016-12-30: 600 mg via ORAL
  Filled 2016-12-30: qty 1

## 2016-12-30 MED ORDER — HYDROCODONE-ACETAMINOPHEN 5-325 MG PO TABS: 1.0000 | ORAL_TABLET | Freq: Four times a day (QID) | ORAL | 0 refills | Status: DC | PRN

## 2016-12-30 MED ORDER — ACETAMINOPHEN 500 MG PO TABS
1000.0000 mg | ORAL_TABLET | Freq: Once | ORAL | Status: AC
  Administered 2016-12-30: 1000 mg via ORAL
  Filled 2016-12-30: qty 2

## 2016-12-30 NOTE — Discharge Instructions (Signed)
Fortunately your blood work was reassuring.  Please follow-up with your primary care physician this week and return to the emergency department sooner for any concerns whatsoever.  It was a pleasure to take care of you today, and thank you for coming to our emergency department.  If you have any questions or concerns before leaving please ask the nurse to grab me and I'm more than happy to go through your aftercare instructions again.  If you were prescribed any opioid pain medication today such as Norco, Vicodin, Percocet, morphine, hydrocodone, or oxycodone please make sure you do not drive when you are taking this medication as it can alter your ability to drive safely.  If you have any concerns once you are home that you are not improving or are in fact getting worse before you can make it to your follow-up appointment, please do not hesitate to call 911 and come back for further evaluation.  Merrily BrittleNeil Ralynn San, MD  Results for orders placed or performed during the hospital encounter of 12/30/16  CBC  Result Value Ref Range   WBC 16.9 (H) 3.6 - 11.0 K/uL   RBC 4.92 3.80 - 5.20 MIL/uL   Hemoglobin 10.8 (L) 12.0 - 16.0 g/dL   HCT 16.134.7 (L) 09.635.0 - 04.547.0 %   MCV 70.7 (L) 80.0 - 100.0 fL   MCH 22.0 (L) 26.0 - 34.0 pg   MCHC 31.1 (L) 32.0 - 36.0 g/dL   RDW 40.919.6 (H) 81.111.5 - 91.414.5 %   Platelets 313 150 - 440 K/uL  Brain natriuretic peptide  Result Value Ref Range   B Natriuretic Peptide 29.0 0.0 - 100.0 pg/mL  Comprehensive metabolic panel  Result Value Ref Range   Sodium 136 135 - 145 mmol/L   Potassium 3.4 (L) 3.5 - 5.1 mmol/L   Chloride 96 (L) 101 - 111 mmol/L   CO2 32 22 - 32 mmol/L   Glucose, Bld 91 65 - 99 mg/dL   BUN 18 6 - 20 mg/dL   Creatinine, Ser 7.820.68 0.44 - 1.00 mg/dL   Calcium 8.9 8.9 - 95.610.3 mg/dL   Total Protein 7.3 6.5 - 8.1 g/dL   Albumin 3.3 (L) 3.5 - 5.0 g/dL   AST 17 15 - 41 U/L   ALT 13 (L) 14 - 54 U/L   Alkaline Phosphatase 87 38 - 126 U/L   Total Bilirubin 0.5 0.3 -  1.2 mg/dL   GFR calc non Af Amer >60 >60 mL/min   GFR calc Af Amer >60 >60 mL/min   Anion gap 8 5 - 15  Troponin I  Result Value Ref Range   Troponin I <0.03 <0.03 ng/mL  hCG, quantitative, pregnancy  Result Value Ref Range   hCG, Beta Chain, Quant, S <1 <5 mIU/mL  Fibrin derivatives D-Dimer  Result Value Ref Range   Fibrin derivatives D-dimer ([facility]) 256.92 0.00 - 499.00 ng/mL (FEU)   Dg Chest 2 View  Result Date: 12/24/2016 CLINICAL DATA:  Shortness of breath. Cough. CHF. Hypertension. Ex-smoker. EXAM: CHEST  2 VIEW COMPARISON:  12/05/2016 FINDINGS: Convex right thoracic spine curvature, status post thoracolumbar spine fixation. Midline trachea. Borderline cardiomegaly. Mediastinal contours otherwise within normal limits. No pleural effusion or pneumothorax. Pulmonary interstitial prominence is favored to be due to low lung volumes and patient body habitus. No overt congestive failure or lobar consolidation. IMPRESSION: No acute cardiopulmonary disease. Electronically Signed   By: Jeronimo GreavesKyle  Talbot M.D.   On: 12/24/2016 11:51   Dg Chest 2 View  Result Date:  12/05/2016 CLINICAL DATA:  Shortness of breath and right-sided chest pain and cough. EXAM: CHEST  2 VIEW COMPARISON:  Chest x-ray of October 25, 2016 FINDINGS: The lungs are well-expanded. The interstitial markings are coarse though stable. The cardiac silhouette is enlarged. The pulmonary vascularity is engorged. The patient has undergone Harrington rod placement and posterior fusion procedures throughout the thoracic and upper lumbar spine for scoliosis. IMPRESSION: Chronic low-grade CHF with mild acute exacerbation. Underlying chronic bronchitic change. No acute pneumonia. Electronically Signed   By: David  SwazilandJordan M.D.   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 Slight vascular congestion. Stable mediastinal contours. Lungs clear. No pleural effusion or pneumothorax. Dextroconvex thoracic scoliosis with prior spinal fixation surgery. IMPRESSION: Enlargement of cardiac silhouette with pulmonary vascular congestion. No definite acute infiltrate. Electronically Signed   By: Ulyses SouthwardMark  Boles M.D.   On: 12/30/2016 19:57

## 2016-12-30 NOTE — ED Notes (Signed)
Pt ambulated in room while maintaining an oxygen saturation above 96%. Pt did report feeling light headed and short of breath. Pt's oxygen saturation at that point was 97%. However, pt was placed back in bed.

## 2016-12-30 NOTE — ED Provider Notes (Signed)
[facility] Emergency Department Provider Note  ____________________________________________   First MD Initiated Contact with Patient 12/30/16 1918     (approximate)  I have reviewed the triage vital signs and the nursing notes.   HISTORY  Chief Complaint Loss of Consciousness    HPI Tracey White is a 36 y.o. female who comes to the emergency department by EMS sustaining a syncopal event while in her shower.  She took a hot shower today and when she was getting ready to leave she began to feel lightheaded nauseated and the next thing she knew she was waking up on the floor with her family screaming at her.  She is never passed out before.  She had no antecedent chest pain or palpitations.  She has no family history of sudden cardiac death.  She does have a complex past medical history including anxiety, congestive heart failure, morbid obesity, and a recent hospitalization at [facility] for bronchitis and upper respiratory tract infection.  She has had no leg swelling.  No history of DVT or pulmonary embolism.  Her symptoms today began suddenly and passed quickly on their own.  They seem to be worse with standing up in the hot shower and improved when lying down.  She does report some mild shortness of breath.  Past Medical History:  Diagnosis Date  . Anxiety   . CHF (congestive heart failure) (HCC)   . Chronic heart failure (HCC)   . Morbid obesity (HCC)   . OSA (obstructive sleep apnea)    uses CPAP at home  . Peripheral edema   . Scoliosis     Patient Active Problem List   Diagnosis Date Noted  . Anemia 12/25/2016  . Asthma exacerbation 12/24/2016  . Knee pain, chronic (Secondary Area of Pain) (B) (R>L) 12/12/2016  . Thoracic back pain (Fourth Area of Pain) (B) (R>L) 12/12/2016  . Chronic pain syndrome 12/12/2016  . Disorder of bone, unspecified 12/12/2016  . Other long term (current) drug therapy 12/12/2016  . Scoliosis 12/12/2016  . Lymphedema  09/24/2016  . Skin lesion of left leg 09/05/2016  . Depression 08/27/2016  . Chronic low back pain (Primary Area of Pain) (R) 08/27/2016  . Hypokalemia 06/06/2016  . Chest pain 05/31/2016  . Chronic diastolic heart failure (HCC) 05/15/2016  . Tachycardia 05/15/2016  . Obstructive sleep apnea on CPAP 05/15/2016  . Tobacco use 05/15/2016  . Chest pain at rest 10/18/2015    Past Surgical History:  Procedure Laterality Date  . HERNIA REPAIR    . scoliosis repair    . TUBAL LIGATION      Prior to Admission medications   Medication Sig Start Date End Date Taking? Authorizing Provider  albuterol (PROVENTIL HFA;VENTOLIN HFA) 108 (90 Base) MCG/ACT inhaler Inhale 2-4 puffs by mouth every 4 hours as needed for wheezing, cough, and/or shortness of breath 05/12/16   Loleta RoseForbach, Cory, MD  ALPRAZolam Prudy Feeler(XANAX) 0.5 MG tablet Take 1 tablet by mouth daily as needed for anxiety.  11/14/15   [provider]  ARIPiprazole (ABILIFY) 2 MG tablet Take 2 mg daily by mouth.    [provider]  beclomethasone (QVAR) 40 MCG/ACT inhaler Inhale 1 puff into the lungs 2 (two) times daily. 10/20/15   Alford HighlandWieting, Richard, MD  bumetanide (BUMEX) 2 MG tablet Take 2-4 mg by mouth See admin instructions. 4 mg in the AM and 2 mg in the PM    [provider]  diclofenac sodium (VOLTAREN) 1 % GEL  Apply 4 (four) times daily topically.    [provider]  diltiazem (CARDIZEM) 120 MG tablet Take 120 mg daily by mouth.    [provider]  escitalopram (LEXAPRO) 20 MG tablet Take 20 mg by mouth daily.     [provider]  gabapentin (NEURONTIN) 100 MG capsule Take 100-200 mg by mouth 3 (three) times daily.    [provider]  HYDROcodone-acetaminophen (NORCO) 5-325 MG tablet Take 1 tablet by mouth every 6 (six) hours as needed for up to 5 doses for severe pain. 12/30/16   Merrily Brittle, MD  lidocaine (LIDODERM) 5 % Place 1 patch daily onto the skin. Remove & Discard patch  within 12 hours or as directed by MD    [provider]  losartan (COZAAR) 25 MG tablet Take 25 mg by mouth daily.    [provider]  metoprolol succinate (TOPROL-XL) 25 MG 24 hr tablet Take 2 tablets daily by mouth.  10/30/15   [provider]  montelukast (SINGULAIR) 10 MG tablet Take 10 mg by mouth at bedtime.    [provider]  potassium chloride SA (K-DUR,KLOR-CON) 20 MEQ tablet Take 40 mEq by mouth 2 (two) times daily.    [provider]  predniSONE (DELTASONE) 10 MG tablet Take 6 tabs x 3 days, then 5 tabs x 3 days, then 4 tabs x 3days, then 3 tabs x 3days, then 2 tabs x 3days, then 1 tab x3 days. 12/26/16   Edsel Petrin, DO    Allergies Patient has no known allergies.  Family History  Problem Relation Age of Onset  . Diabetes Mother   . Hypertension Mother     Social History Social History   Tobacco Use  . Smoking status: Current Every Day Smoker    Packs/day: 0.50    Types: Cigarettes  . Smokeless tobacco: Never Used  Substance Use Topics  . Alcohol use: Yes    Comment: occasionally  . Drug use: No    Review of Systems Constitutional: No fever/chills Eyes: No visual changes. ENT: No sore throat. Cardiovascular: Denies chest pain. Respiratory: Positive for shortness of breath. Gastrointestinal: No abdominal pain.  No nausea, no vomiting.  No diarrhea.  No constipation. Genitourinary: Negative for dysuria. Musculoskeletal: Negative for back pain. Skin: Negative for rash. Neurological: Negative for headaches, focal weakness or numbness.   ____________________________________________   PHYSICAL EXAM:  VITAL SIGNS: ED Triage Vitals  Enc Vitals Group     BP      Pulse      Resp      Temp      Temp src      SpO2      Weight      Height      Head Circumference      Peak Flow      Pain Score      Pain Loc      Pain Edu?      Excl. in GC?     Constitutional: Alert and oriented x4 morbidly obese  somewhat anxious appearing nontoxic no diaphoresis speaks in full clear sentences Eyes: PERRL EOMI. Head: Atraumatic. Nose: No congestion/rhinnorhea. Mouth/Throat: No trismus Neck: No stridor.   Cardiovascular: Normal rate, regular rhythm. Grossly normal heart sounds.  Good peripheral circulation. Respiratory: Normal respiratory effort.  No retractions. Lungs CTAB and moving good air Gastrointestinal: Morbidly obese soft nontender Musculoskeletal: Legs are equal in size no erythema warmth or cords Neurologic:  Normal speech and language. No gross  focal neurologic deficits are appreciated. Skin:  Skin is warm, dry and intact. No rash noted. Psychiatric: Somewhat anxious appearing.    ____________________________________________   DIFFERENTIAL includes but not limited to  Dehydration, vasovagal syncope, cardiogenic syncope, pulmonary embolism ____________________________________________   LABS (all labs ordered are listed, but only abnormal results are displayed)  Labs Reviewed  CBC - Abnormal; Notable for the following components:      Result Value   WBC 16.9 (*)    Hemoglobin 10.8 (*)    HCT 34.7 (*)    MCV 70.7 (*)    MCH 22.0 (*)    MCHC 31.1 (*)    RDW 19.6 (*)    All other components within normal limits  COMPREHENSIVE METABOLIC PANEL - Abnormal; Notable for the following components:   Potassium 3.4 (*)    Chloride 96 (*)    Albumin 3.3 (*)    ALT 13 (*)    All other components within normal limits  BRAIN NATRIURETIC PEPTIDE  TROPONIN I  HCG, QUANTITATIVE, PREGNANCY  FIBRIN DERIVATIVES D-DIMER ([facility] ONLY)  URINALYSIS, COMPLETE (UACMP) WITH MICROSCOPIC  CBG MONITORING, ED  POC URINE PREG, ED    Blood work reviewed by me shows normal d-dimer normal troponin and normal beta natruretic peptide.  Elevated white count is nonspecific and could be secondary to stress __________________________________________  EKG  ED ECG REPORT I, Merrily BrittleNeil Ayaana Biondo, the attending  physician, personally viewed and interpreted this ECG.  Date: 12/30/2016 EKG Time:  Rate: 85 Rhythm: normal sinus rhythm QRS Axis: normal Intervals: normal ST/T Wave abnormalities: normal Narrative Interpretation: no evidence of acute ischemia  ____________________________________________  RADIOLOGY  Chest x-ray reviewed by me with mild pulmonary edema ____________________________________________   PROCEDURES  Procedure(s) performed: no  Procedures  Critical Care performed: no  Observation: no ____________________________________________   INITIAL IMPRESSION / ASSESSMENT AND PLAN / ED COURSE  Pertinent labs & imaging results that were available during my care of the patient were reviewed by me and considered in my medical decision making (see chart for details).  Patient arrives somewhat anxious appearing but overall very well.  Her EKG has no Brugada, no signs of HOCM, normal her history is most consistent with vasovagal syncope but I would keep her on monitor several hours and check.  The most concerning is a reasonable hospital stay and immobilization raising the concern of possible pulmonary embolism.  She is low risk so a d-dimer would adequately rule her out     ----------------------------------------- 9:47 PM on 12/30/2016 -----------------------------------------  The patient was able to ambulate without desaturation.  I had a lengthy discussion with the patient and her family regarding her results and that she should remain well-hydrated and continue to recover from her recent hospitalization.  I did advise her to follow-up with her primary care physician this week for reevaluation and strict return precautions given.  She verbalizes understanding and agreement the plan. ____________________________________________   FINAL CLINICAL IMPRESSION(S) / ED DIAGNOSES  Final diagnoses:  Syncope and collapse  Vasovagal syncope      NEW MEDICATIONS STARTED  DURING THIS VISIT:  This SmartLink is deprecated. Use AVSMEDLIST instead to display the medication list for a patient.   Note:  This document was prepared using Dragon voice recognition software and may include unintentional dictation errors.     Merrily Brittleifenbark, Nil Xiong, MD 12/30/16 [phone]

## 2016-12-30 NOTE — ED Triage Notes (Signed)
Pt was brought in by EMS after syncopal episode in the shower around 1 hour prior to arrival. Pt has CHF and was recently discharged from [facility] with bronchitis. Pt states she went back to work and thinks she "out did it today." Pt is unsure if she hit her head but does complain of a headache. Pt is alert and oriented x 4 upon assessment.

## 2016-12-30 NOTE — Telephone Encounter (Signed)
Pt informing us about xrays

## 2017-01-01 ENCOUNTER — Other Ambulatory Visit: Payer: Self-pay

## 2017-01-01 ENCOUNTER — Ambulatory Visit: Payer: Medicaid Other | Attending: Family | Admitting: Family

## 2017-01-01 ENCOUNTER — Encounter: Payer: Self-pay | Admitting: Family

## 2017-01-01 VITALS — BP 102/70 | HR 98 | Resp 20 | Ht 61.0 in | Wt 359.5 lb

## 2017-01-01 DIAGNOSIS — Z9851 Tubal ligation status: Secondary | ICD-10-CM | POA: Insufficient documentation

## 2017-01-01 DIAGNOSIS — Z8249 Family history of ischemic heart disease and other diseases of the circulatory system: Secondary | ICD-10-CM | POA: Insufficient documentation

## 2017-01-01 DIAGNOSIS — Z720 Tobacco use: Secondary | ICD-10-CM

## 2017-01-01 DIAGNOSIS — Z9889 Other specified postprocedural states: Secondary | ICD-10-CM | POA: Diagnosis not present

## 2017-01-01 DIAGNOSIS — F329 Major depressive disorder, single episode, unspecified: Secondary | ICD-10-CM | POA: Diagnosis not present

## 2017-01-01 DIAGNOSIS — R0602 Shortness of breath: Secondary | ICD-10-CM | POA: Insufficient documentation

## 2017-01-01 DIAGNOSIS — Z833 Family history of diabetes mellitus: Secondary | ICD-10-CM | POA: Diagnosis not present

## 2017-01-01 DIAGNOSIS — I890 Lymphedema, not elsewhere classified: Secondary | ICD-10-CM | POA: Diagnosis not present

## 2017-01-01 DIAGNOSIS — F17210 Nicotine dependence, cigarettes, uncomplicated: Secondary | ICD-10-CM | POA: Insufficient documentation

## 2017-01-01 DIAGNOSIS — I509 Heart failure, unspecified: Secondary | ICD-10-CM | POA: Insufficient documentation

## 2017-01-01 DIAGNOSIS — G4733 Obstructive sleep apnea (adult) (pediatric): Secondary | ICD-10-CM | POA: Insufficient documentation

## 2017-01-01 DIAGNOSIS — F419 Anxiety disorder, unspecified: Secondary | ICD-10-CM | POA: Diagnosis not present

## 2017-01-01 DIAGNOSIS — Z79899 Other long term (current) drug therapy: Secondary | ICD-10-CM | POA: Diagnosis not present

## 2017-01-01 DIAGNOSIS — I5031 Acute diastolic (congestive) heart failure: Secondary | ICD-10-CM | POA: Insufficient documentation

## 2017-01-01 MED ORDER — METOLAZONE 2.5 MG PO TABS: 2.5000 mg | ORAL_TABLET | Freq: Every day | ORAL | 0 refills | Status: DC

## 2017-01-01 MED ORDER — POTASSIUM CHLORIDE CRYS ER 20 MEQ PO TBCR: 40.0000 meq | EXTENDED_RELEASE_TABLET | Freq: Two times a day (BID) | ORAL | 5 refills | Status: DC

## 2017-01-01 NOTE — Patient Instructions (Addendum)
Continue weighing daily and call for an overnight weight gain of > 2 pounds or a weekly weight gain of >5 pounds.  Take the metolazone 2.5mg  daily for the next 3 days. Best to take it 1/2 hour prior to Bumex.  Increase your potassium to 2 tablets twice daily. For these next 3 days, take an additional 2 tablets daily.

## 2017-01-01 NOTE — Progress Notes (Signed)
Patient ID: Tracey White, female    DOB: 09-Apr-1980, [age] y.o.   MRN: [id number]  HPI  Tracey White is a 36 y/o female with a history of anxiety, obstructive sleep apnea (with nasal CPAP), scoliosis, recent tobacco use and chronic heart failure.   Reviewed last echo report done 10/19/15 which showed an EF of 65-70% along with trivial TR.   Was in the ED 12/30/16 due to syncope where she was treated and released. Admitted 12/24/16 due to asthma exacerbation/bronchitis. Was given solumedrol, levaquin and nebulizers. Discharged home after 2 days with an extended prednisone taper. Was in the ED 12/05/16 due to worsening back pain and HF. Was treated and released. Presented to the ED 10/26/16 and left without being seen. Was in the ED 08/05/16 with peripheral edema. Was treated with bumex with good response and discharged home. Was in the ED 07/17/16 due to low back pain along with peripheral edema. + bulging lumbar disc; Negative for DVT, IV diuretics given and she was released home. Was in the ED 07/04/16 due to chest pain & dizziness due to extra diuretic that patient took on her own at home. Treated and released.    She presents today for a follow-up visit with a chief complaint of moderate shortness of breath upon minimal exertion. She describes this as chronic in nature having been present for several years with varying levels of severity. She has associated fatigue, cough, light-headedness, weakness, edema, abdominal distention and weight gain. She denies any chest pain, palpitations or difficulty sleeping.    Past Medical History:  Diagnosis Date  . Anxiety   . CHF (congestive heart failure) (HCC)   . Chronic heart failure (HCC)   . Morbid obesity (HCC)   . OSA (obstructive sleep apnea)    uses CPAP at home  . Peripheral edema   . Scoliosis    Past Surgical History:  Procedure Laterality Date  . HERNIA REPAIR    . scoliosis repair    . TUBAL LIGATION     Family History  Problem Relation Age of  Onset  . Diabetes Mother   . Hypertension Mother    Social History   Tobacco Use  . Smoking status: Current Every Day Smoker    Packs/day: 0.50    Types: Cigarettes  . Smokeless tobacco: Never Used  Substance Use Topics  . Alcohol use: Yes    Comment: occasionally   No Known Allergies  Prior to Admission medications   Medication Sig Start Date End Date Taking? Authorizing Provider  albuterol (PROVENTIL HFA;VENTOLIN HFA) 108 (90 Base) MCG/ACT inhaler Inhale 2-4 puffs by mouth every 4 hours as needed for wheezing, cough, and/or shortness of breath 05/12/16  Yes Loleta RoseForbach, Cory, MD  ALPRAZolam Prudy Feeler(XANAX) 0.5 MG tablet Take 1 tablet by mouth daily as needed for anxiety.  11/14/15  Yes [provider]  ARIPiprazole (ABILIFY) 2 MG tablet Take 2 mg daily by mouth.   Yes [provider]  beclomethasone (QVAR) 40 MCG/ACT inhaler Inhale 1 puff into the lungs 2 (two) times daily. 10/20/15  Yes Wieting, Richard, MD  bumetanide (BUMEX) 2 MG tablet Take 2-4 mg by mouth See admin instructions. 4 mg in the AM and 2 mg in the PM   Yes [provider]  diclofenac sodium (VOLTAREN) 1 % GEL Apply 4 (four) times daily topically.   Yes [provider]  diltiazem (CARDIZEM) 120 MG tablet Take 120 mg daily by mouth.   Yes [provider]  escitalopram (LEXAPRO) 20 MG tablet Take 20 mg by mouth daily.    Yes [provider]  gabapentin (NEURONTIN) 100 MG capsule Take 100-200 mg by mouth 3 (three) times daily.   Yes [provider]  HYDROcodone-acetaminophen (NORCO) 5-325 MG tablet Take 1 tablet by mouth every 6 (six) hours as needed for up to 5 doses for severe pain. 12/30/16  Yes Merrily Brittle, MD  lidocaine (LIDODERM) 5 % Place 1 patch daily onto the skin. Remove & Discard patch within 12 hours or as directed by MD   Yes [provider]  losartan (COZAAR) 25 MG tablet Take 25 mg by mouth daily.   Yes [provider]  metoprolol  succinate (TOPROL-XL) 25 MG 24 hr tablet Take 2 tablets daily by mouth.  10/30/15  Yes [provider]  montelukast (SINGULAIR) 10 MG tablet Take 10 mg by mouth at bedtime.   Yes [provider]  potassium chloride SA (K-DUR,KLOR-CON) 20 MEQ tablet Take 40 mEq by mouth. Taking QAM and QPM   Yes [provider]  predniSONE (DELTASONE) 10 MG tablet Take 6 tabs x 3 days, then 5 tabs x 3 days, then 4 tabs x 3days, then 3 tabs x 3days, then 2 tabs x 3days, then 1 tab x3 days. 12/26/16  Yes Mikhail, Nita Sells, DO   Review of Systems  Constitutional: Positive for fatigue (better with CPAP). Negative for appetite change.  HENT: Positive for congestion. Negative for postnasal drip and sore throat.   Eyes: Negative.   Respiratory: Positive for cough and shortness of breath. Negative for chest tightness and wheezing.   Cardiovascular: Positive for leg swelling. Negative for chest pain and palpitations.  Gastrointestinal: Positive for abdominal distention. Negative for abdominal pain.  Endocrine: Negative.   Genitourinary: Negative.   Musculoskeletal: Positive for arthralgias (knees) and back pain.  Skin: Negative.   Allergic/Immunologic: Negative.   Neurological: Positive for weakness and light-headedness. Negative for dizziness.  Hematological: Negative for adenopathy. Does not bruise/bleed easily.  Psychiatric/Behavioral: Positive for dysphoric mood. Negative for sleep disturbance (has resumed wearing CPAP) and suicidal ideas. The patient is not nervous/anxious.    Vitals:   01/01/17 1113  BP: 102/70  Pulse: 98  Resp: 20  SpO2: 99%  Weight: (!) 359 lb 8 oz (163.1 kg)  Height: 5\' 1"  (1.549 m)   Wt Readings from Last 3 Encounters:  01/01/17 (!) 359 lb 8 oz (163.1 kg)  12/30/16 (!) 355 lb (161 kg)  12/25/16 (!) 355 lb 8 oz (161.3 kg)    Lab Results  Component Value Date   CREATININE 0.68 12/30/2016   CREATININE 0.68 12/25/2016   CREATININE 0.71  12/24/2016    Physical Exam  Constitutional: She is oriented to person, place, and time. She appears well-developed and well-nourished.  HENT:  Head: Normocephalic and atraumatic.  Neck: Normal range of motion. Neck supple. No JVD present.  Cardiovascular: Normal rate and regular rhythm.  Pulmonary/Chest: Effort normal. She has no wheezes. She has no rhonchi. She has no rales.  Abdominal: Soft. She exhibits distension. There is no tenderness.  Musculoskeletal: She exhibits edema (2+ pitting edema). She exhibits no tenderness.  Neurological: She is alert and oriented to person, place, and time.  Skin: Skin is warm and dry.  Psychiatric: She has a normal mood and affect. Her behavior is normal. Thought content normal.  Nursing note and vitals reviewed.  Assessment & Plan:  1: Acute heart failure with preserved ejection fraction- - NYHA class  III - moderately fluid overloaded today -weighing daily. Reminded her to call for an overnight weight gain of >2 pounds or a weekly weight gain of >5 pounds. Says that at home yesterday she weighed 354 pounds and this morning she weighed 366 pounds - weight up 17 pounds since she was last here - not adding salt and has been trying to read food labels more often - saw cardiologist Juliann Pares([place]) 11/14/16 - BMP from 12/30/16 shows sodium 136, potassium 3.4 and GFR >60 - beginning the process of looking into bariatric surgery - will add metolazone 2.5mg  daily for the next 3 days - will increase her daily potassium to 40meq BID (due to potassium being 3.4) but she needs to take an additional 40meq daily while taking the metolazone  2: Depression- - feels like her depression is worsening because she feels so bad - had been working up till her syncopal event - has an appointment with a new PCP on 01/06/17  3: Lymphedema- - stage 1 lymphedema with slight improvement after elevation - edema returns after sitting/walking even for short periods of time -  had been wearing compression boots with improvement in her edema but is now waiting on a part  4: Tobacco use- - had recently started taking chantix 2 days ago and since then, she feels like her anxiety/depression is worse and she just "feels bad" - advised her to stop the chantix for now and talk to her new PCP about this - encouraged her to not resume smoking  Patient did not bring her medications nor a list. Each medication was verbally reviewed with the patient and she was encouraged to bring the bottles to every visit to confirm accuracy of list.  Return in 1 week or sooner for any questions/problems before then. Will get a BMP at this time.

## 2017-01-06 ENCOUNTER — Ambulatory Visit: Payer: Self-pay | Admitting: Pain Medicine

## 2017-01-08 ENCOUNTER — Encounter: Payer: Self-pay | Admitting: Pain Medicine

## 2017-01-08 ENCOUNTER — Ambulatory Visit: Payer: Medicaid Other | Attending: Pain Medicine | Admitting: Pain Medicine

## 2017-01-08 ENCOUNTER — Ambulatory Visit: Payer: Self-pay | Admitting: Family

## 2017-01-08 ENCOUNTER — Other Ambulatory Visit: Payer: Self-pay

## 2017-01-08 VITALS — BP 117/74 | HR 88 | Temp 98.1°F | Ht 61.0 in | Wt 351.0 lb

## 2017-01-08 DIAGNOSIS — I890 Lymphedema, not elsewhere classified: Secondary | ICD-10-CM | POA: Insufficient documentation

## 2017-01-08 DIAGNOSIS — F329 Major depressive disorder, single episode, unspecified: Secondary | ICD-10-CM | POA: Insufficient documentation

## 2017-01-08 DIAGNOSIS — M79605 Pain in left leg: Secondary | ICD-10-CM | POA: Diagnosis not present

## 2017-01-08 DIAGNOSIS — R700 Elevated erythrocyte sedimentation rate: Secondary | ICD-10-CM | POA: Diagnosis not present

## 2017-01-08 DIAGNOSIS — M25561 Pain in right knee: Secondary | ICD-10-CM

## 2017-01-08 DIAGNOSIS — I5032 Chronic diastolic (congestive) heart failure: Secondary | ICD-10-CM | POA: Insufficient documentation

## 2017-01-08 DIAGNOSIS — Z79899 Other long term (current) drug therapy: Secondary | ICD-10-CM | POA: Diagnosis not present

## 2017-01-08 DIAGNOSIS — M419 Scoliosis, unspecified: Secondary | ICD-10-CM | POA: Diagnosis not present

## 2017-01-08 DIAGNOSIS — G4733 Obstructive sleep apnea (adult) (pediatric): Secondary | ICD-10-CM | POA: Diagnosis not present

## 2017-01-08 DIAGNOSIS — M545 Low back pain: Secondary | ICD-10-CM | POA: Diagnosis present

## 2017-01-08 DIAGNOSIS — D649 Anemia, unspecified: Secondary | ICD-10-CM | POA: Insufficient documentation

## 2017-01-08 DIAGNOSIS — R7982 Elevated C-reactive protein (CRP): Secondary | ICD-10-CM | POA: Diagnosis not present

## 2017-01-08 DIAGNOSIS — M5442 Lumbago with sciatica, left side: Secondary | ICD-10-CM | POA: Diagnosis not present

## 2017-01-08 DIAGNOSIS — E876 Hypokalemia: Secondary | ICD-10-CM | POA: Diagnosis not present

## 2017-01-08 DIAGNOSIS — Z6841 Body Mass Index (BMI) 40.0 and over, adult: Secondary | ICD-10-CM | POA: Insufficient documentation

## 2017-01-08 DIAGNOSIS — Z789 Other specified health status: Secondary | ICD-10-CM | POA: Insufficient documentation

## 2017-01-08 DIAGNOSIS — J45909 Unspecified asthma, uncomplicated: Secondary | ICD-10-CM | POA: Insufficient documentation

## 2017-01-08 DIAGNOSIS — M792 Neuralgia and neuritis, unspecified: Secondary | ICD-10-CM

## 2017-01-08 DIAGNOSIS — M546 Pain in thoracic spine: Secondary | ICD-10-CM | POA: Insufficient documentation

## 2017-01-08 DIAGNOSIS — Z9989 Dependence on other enabling machines and devices: Secondary | ICD-10-CM

## 2017-01-08 DIAGNOSIS — M25562 Pain in left knee: Secondary | ICD-10-CM

## 2017-01-08 DIAGNOSIS — G894 Chronic pain syndrome: Secondary | ICD-10-CM | POA: Insufficient documentation

## 2017-01-08 DIAGNOSIS — M961 Postlaminectomy syndrome, not elsewhere classified: Secondary | ICD-10-CM

## 2017-01-08 DIAGNOSIS — R079 Chest pain, unspecified: Secondary | ICD-10-CM | POA: Diagnosis not present

## 2017-01-08 DIAGNOSIS — M5441 Lumbago with sciatica, right side: Secondary | ICD-10-CM

## 2017-01-08 DIAGNOSIS — E559 Vitamin D deficiency, unspecified: Secondary | ICD-10-CM

## 2017-01-08 DIAGNOSIS — R000 Tachycardia, unspecified: Secondary | ICD-10-CM | POA: Insufficient documentation

## 2017-01-08 DIAGNOSIS — E662 Morbid (severe) obesity with alveolar hypoventilation: Secondary | ICD-10-CM

## 2017-01-08 DIAGNOSIS — M79604 Pain in right leg: Secondary | ICD-10-CM | POA: Insufficient documentation

## 2017-01-08 DIAGNOSIS — G8929 Other chronic pain: Secondary | ICD-10-CM

## 2017-01-08 MED ORDER — TRAMADOL HCL 50 MG PO TABS: 50.0000 mg | ORAL_TABLET | Freq: Three times a day (TID) | ORAL | 0 refills | Status: DC | PRN

## 2017-01-08 MED ORDER — CHOLECALCIFEROL 125 MCG (5000 UT) PO CAPS: 5000.0000 [IU] | ORAL_CAPSULE | Freq: Every day | ORAL | 0 refills | Status: DC

## 2017-01-08 MED ORDER — CALCIUM PLUS D3 ABSORBABLE 600-2500 MG-UNIT PO CAPS: 1.0000 | ORAL_CAPSULE | Freq: Every day | ORAL | 0 refills | Status: DC

## 2017-01-08 MED ORDER — ERGOCALCIFEROL 1.25 MG (50000 UT) PO CAPS: 50000.0000 [IU] | ORAL_CAPSULE | ORAL | 0 refills | Status: DC

## 2017-01-08 NOTE — Patient Instructions (Addendum)
____________________________________________________________________________________________  Pain Scale  Introduction: The pain score used by this practice is the Verbal Numerical Rating Scale (VNRS-11). This is an 11-point scale. It is for adults and children 10 years or older. There are significant differences in how the pain score is reported, used, and applied. Forget everything you learned in the past and learn this scoring system.  General Information: The scale should reflect your current level of pain. Unless you are specifically asked for the level of your worst pain, or your average pain. If you are asked for one of these two, then it should be understood that it is over the past 24 hours.  Basic Activities of Daily Living (ADL): Personal hygiene, dressing, eating, transferring, and using restroom.  Instructions: Most patients tend to report their level of pain as a combination of two factors, their physical pain and their psychosocial pain. This last one is also known as "suffering" and it is reflection of how physical pain affects you socially and psychologically. From now on, report them separately. From this point on, when asked to report your pain level, report only your physical pain. Use the following table for reference.  Pain Clinic Pain Levels (0-5/10)  Pain Level Score  Description  No Pain 0   Mild pain 1 Nagging, annoying, but does not interfere with basic activities of daily living (ADL). Patients are able to eat, bathe, get dressed, toileting (being able to get on and off the toilet and perform personal hygiene functions), transfer (move in and out of bed or a chair without assistance), and maintain continence (able to control bladder and bowel functions). Blood pressure and heart rate are unaffected. A normal heart rate for a healthy adult ranges from 60 to 100 bpm (beats per minute).   Mild to moderate pain 2 Noticeable and distracting. Impossible to hide from other  people. More frequent flare-ups. Still possible to adapt and function close to normal. It can be very annoying and may have occasional stronger flare-ups. With discipline, patients may get used to it and adapt.   Moderate pain 3 Interferes significantly with activities of daily living (ADL). It becomes difficult to feed, bathe, get dressed, get on and off the toilet or to perform personal hygiene functions. Difficult to get in and out of bed or a chair without assistance. Very distracting. With effort, it can be ignored when deeply involved in activities.   Moderately severe pain 4 Impossible to ignore for more than a few minutes. With effort, patients may still be able to manage work or participate in some social activities. Very difficult to concentrate. Signs of autonomic nervous system discharge are evident: dilated pupils (mydriasis); mild sweating (diaphoresis); sleep interference. Heart rate becomes elevated (>115 bpm). Diastolic blood pressure (lower number) rises above 100 mmHg. Patients find relief in laying down and not moving.   Severe pain 5 Intense and extremely unpleasant. Associated with frowning face and frequent crying. Pain overwhelms the senses.  Ability to do any activity or maintain social relationships becomes significantly limited. Conversation becomes difficult. Pacing back and forth is common, as getting into a comfortable position is nearly impossible. Pain wakes you up from deep sleep. Physical signs will be obvious: pupillary dilation; increased sweating; goosebumps; brisk reflexes; cold, clammy hands and feet; nausea, vomiting or dry heaves; loss of appetite; significant sleep disturbance with inability to fall asleep or to remain asleep. When persistent, significant weight loss is observed due to the complete loss of appetite and sleep deprivation.  Blood   pressure and heart rate becomes significantly elevated. Caution: If elevated blood pressure triggers a pounding headache  associated with blurred vision, then the patient should immediately seek attention at an urgent or emergency care unit, as these may be signs of an impending stroke.    Emergency Department Pain Levels (6-10/10)  Emergency Room Pain 6 Severely limiting. Requires emergency care and should not be seen or managed at an outpatient pain management facility. Communication becomes difficult and requires great effort. Assistance to reach the emergency department may be required. Facial flushing and profuse sweating along with potentially dangerous increases in heart rate and blood pressure will be evident.   Distressing pain 7 Self-care is very difficult. Assistance is required to transport, or use restroom. Assistance to reach the emergency department will be required. Tasks requiring coordination, such as bathing and getting dressed become very difficult.   Disabling pain 8 Self-care is no longer possible. At this level, pain is disabling. The individual is unable to do even the most "basic" activities such as walking, eating, bathing, dressing, transferring to a bed, or toileting. Fine motor skills are lost. It is difficult to think clearly.   Incapacitating pain 9 Pain becomes incapacitating. Thought processing is no longer possible. Difficult to remember your own name. Control of movement and coordination are lost.   The worst pain imaginable 10 At this level, most patients pass out from pain. When this level is reached, collapse of the autonomic nervous system occurs, leading to a sudden drop in blood pressure and heart rate. This in turn results in a temporary and dramatic drop in blood flow to the brain, leading to a loss of consciousness. Fainting is one of the body's self defense mechanisms. Passing out puts the brain in a calmed state and causes it to shut down for a while, in order to begin the healing process.    Summary: 1. Refer to this scale when providing [place] with your pain level. 2. Be  accurate and careful when reporting your pain level. This will help with your care. 3. Over-reporting your pain level will lead to loss of credibility. 4. Even a level of 1/10 means that there is pain and will be treated at our facility. 5. High, inaccurate reporting will be documented as "Symptom Exaggeration", leading to loss of credibility and suspicions of possible secondary gains such as obtaining more narcotics, or wanting to appear disabled, for fraudulent reasons. 6. Only pain levels of 5 or below will be seen at our facility. 7. Pain levels of 6 and above will be sent to the Emergency Department and the appointment cancelled. ____________________________________________________________________________________________  Anti-inflammatory diet: [email or web address].rice.info/   ____________________________________________________________________________________________  Medication Rules  Applies to: All patients receiving prescriptions (written or electronic).  Pharmacy of record: Pharmacy where electronic prescriptions will be sent. If written prescriptions are taken to a different pharmacy, please inform the nursing staff. The pharmacy listed in the electronic medical record should be the one where you would like electronic prescriptions to be sent.  Prescription refills: Only during scheduled appointments. Applies to both, written and electronic prescriptions.  NOTE: The following applies primarily to controlled substances (Opioid* Pain Medications).   Patient's responsibilities: 1. Pain Pills: Bring all pain pills to every appointment (except for procedure appointments). 2. Pill Bottles: Bring pills in original pharmacy bottle. Always bring newest bottle. Bring bottle, even if empty. 3. Medication refills: You are responsible for knowing and keeping track of what medications you need refilled. The day before your appointment, write  a list of all prescriptions  that need to be refilled. Bring that list to your appointment and give it to the admitting nurse. Prescriptions will be written only during appointments. If you forget a medication, it will not be "Called in", "Faxed", or "electronically sent". You will need to get another appointment to get these prescribed. 4. Prescription Accuracy: You are responsible for carefully inspecting your prescriptions before leaving our office. Have the discharge nurse carefully go over each prescription with you, before taking them home. Make sure that your name is accurately spelled, that your address is correct. Check the name and dose of your medication to make sure it is accurate. Check the number of pills, and the written instructions to make sure they are clear and accurate. Make sure that you are given enough medication to last until your next medication refill appointment. 5. Taking Medication: Take medication as prescribed. Never take more pills than instructed. Never take medication more frequently than prescribed. Taking less pills or less frequently is permitted and encouraged, when it comes to controlled substances (written prescriptions).  6. Inform other Doctors: Always inform, all of your healthcare providers, of all the medications you take. 7. Pain Medication from other Providers: You are not allowed to accept any additional pain medication from any other Doctor or Healthcare provider. There are two exceptions to this rule. (see below) In the event that you require additional pain medication, you are responsible for notifying us, as stated below. 8. Medication Agreement: You are responsible for carefully reading and following our Medication Agreement. This must be signed before receiving any prescriptions from our practice. Safely store a copy of your signed Agreement. Violations to the Agreement will result in no further prescriptions. (Additional copies of our Medication Agreement are available upon  request.) 9. Laws, Rules, & Regulations: All patients are expected to follow all [address]Federal and Walt DisneyState Laws, [employer] IndustriesStatutes, Rules, [place] [employer] Regulations. Ignorance of the Laws does not constitute a valid excuse. The use of any illegal substances is prohibited. 10. Adopted CDC guidelines & recommendations: Target dosing levels will be at or below 60 MME/day. Use of benzodiazepines** is not recommended.  Exceptions: There are only two exceptions to the rule of not receiving pain medications from other Healthcare Providers. 1. Exception #1 (Emergencies): In the event of an emergency (i.e.: accident requiring emergency care), you are allowed to receive additional pain medication. However, you are responsible for: As soon as you are able, call our office [phone], at any time of the day or night, and leave a message stating your name, the date and nature of the emergency, and the name and dose of the medication prescribed. In the event that your call is answered by a member of our staff, make sure to document and save the date, time, and the name of the person that took your information.  2. Exception #2 (Planned Surgery): In the event that you are scheduled by another doctor or dentist to have any type of surgery or procedure, you are allowed (for a period no longer than 30 days), to receive additional pain medication, for the acute post-op pain. However, in this case, you are responsible for picking up a copy of our "Post-op Pain Management for Surgeons" handout, and giving it to your surgeon or dentist. This document is available at our office, and does not require an appointment to obtain it. Simply go to our office during business hours (Monday-Thursday from 8:00 AM to 4:00 PM) (Friday 8:00 AM to 12:00  Noon) or if you have a scheduled appointment with us, prior to your surgery, and ask for it by name. In addition, you will need to provide us with your name, name of your surgeon, type of surgery, and date of procedure or  surgery.  *Opioid medications include: morphine, codeine, oxycodone, oxymorphone, hydrocodone, hydromorphone, meperidine, tramadol, tapentadol, buprenorphine, fentanyl, methadone. **Benzodiazepine medications include: diazepam (Valium), alprazolam (Xanax), clonazepam (Klonopine), lorazepam (Ativan), clorazepate (Tranxene), chlordiazepoxide (Librium), estazolam (Prosom), oxazepam (Serax), temazepam (Restoril), triazolam (Halcion)  ____________________________________________________________________________________________

## 2017-01-08 NOTE — Progress Notes (Addendum)
Patient's Name: Tracey White  MRN: [id number]  Referring Provider: Derinda Late, MD  DOB: 1980/02/14  PCP: Derinda Late, MD  DOS: 01/08/2017  Note by: Gaspar Cola, MD  Service setting: Ambulatory outpatient  Specialty: Interventional Pain Management  Location: [facility] (AMB) Pain Management Facility    Patient type: Established   Primary Reason(s) for Visit: Encounter for evaluation before starting new chronic pain management plan of care (Level of risk: moderate) CC: Back Pain (lower)  HPI  Tracey White is a 36 y.o. year old, female patient, who comes today for a follow-up evaluation to review the test results and decide on a treatment plan. She has Chest pain at rest; Chronic diastolic heart failure ([place]); Tachycardia; Obstructive sleep apnea on CPAP; Tobacco use; Chest pain; Hypokalemia; Depression; Chronic low back pain (Primary Area of Pain) (Bilateral) (L>R); Skin lesion of left leg; Lymphedema; Chronic knee pain (Secondary Area of Pain) (Bilateral) (R>L); Chronic thoracic back pain (Fourth Area of Pain) (Bilateral) (R>L); Chronic pain syndrome; Disorder of skeletal system; Other long term (current) drug therapy; Scoliosis; Asthma without status asthmaticus; Anemia; Acute diastolic heart failure ([place]); Neurogenic pain; Failed back surgical syndrome; Pharmacologic therapy; Problems influencing health status; Chronic lower extremity pain (Tertiary Area of Pain) (Bilateral) (R>L); Elevated C-reactive protein (CRP); Elevated sed rate; Vitamin D deficiency; and Class 3 obesity with alveolar hypoventilation, serious comorbidity, and body mass index (BMI) of 60.0 to 69.9 in adult [facility]) on their problem list. Her primarily concern today is the Back Pain (lower)  Pain Assessment: Location: Lower Back Radiating: radiates down left leg to knee Onset: More than a month ago Duration: Chronic pain Quality: Aching, Shooting Severity: 10-Worst pain ever/10 (self-reported pain score)  Note: Reported  level is inconsistent with clinical observations. Clinically the patient looks like a 3/10 A 3/10 is viewed as "Moderate" and described as significantly interfering with activities of daily living (ADL). It becomes difficult to feed, bathe, get dressed, get on and off the toilet or to perform personal hygiene functions. Difficult to get in and out of bed or a chair without assistance. Very distracting. With effort, it can be ignored when deeply involved in activities. Information on the proper use of the pain scale provided to the patient today. When using our objective Pain Scale, levels between 6 and 10/10 are said to belong in an emergency room, as it progressively worsens from a 6/10, described as severely limiting, requiring emergency care not usually available at an outpatient pain management facility. At a 6/10 level, communication becomes difficult and requires great effort. Assistance to reach the emergency department may be required. Facial flushing and profuse sweating along with potentially dangerous increases in heart rate and blood pressure will be evident. Timing: Constant Modifying factors: meds, compression boots, resting  Tracey White comes in today for a follow-up visit after her initial evaluation on 12/25/2016. Today we went over the results of her tests. These were explained in "Layman's terms". During today's appointment we went over my diagnostic impression, as well as the proposed treatment plan.  According to the patient her primary area of pain is in her lower back. She admits that the right side is greater than the left. She admits that she has shooting pain down into her knees. She has scoliosis she s/p spinal fusion in1990 and 1992. She denies any interventional therapy. She did have 2 sessions of physical therapy. She does not feel like it was effective however transportation was the main issue. She denies any recent  images.  Her second area of pain is in her knees. She admits  that the right is greater than the left. She admits the pain is mostly at the top of her knee. She denies any interventional therapy,  physical therapy or recent images.  Her third area of pain is in her lower extremities. She admits that she has some numbness tingling in her right leg and foot (Bottom on right foot) . On the left, it stops at the knee. (Dermatomal Patterns).  She just completed her first visit for weight loss surgery. She is pending approval.   In considering the treatment plan options, Ms. Vanacker was reminded that I no longer take patients for medication management only. I asked her to let me know if she had no intention of taking advantage of the interventional therapies, so that we could make arrangements to provide this space to someone interested. I also made it clear that undergoing interventional therapies for the purpose of getting pain medications is very inappropriate on the part of a patient, and it will not be tolerated in this practice. This type of behavior would suggest true addiction and therefore it requires referral to an addiction specialist.   Further details on both, my assessment(s), as well as the proposed treatment plan, please see below.  Controlled Substance Pharmacotherapy Assessment REMS (Risk Evaluation and Mitigation Strategy)  Analgesic: None Highest recorded MME/day: 25 mg/day MME/day: 0 mg/day Pill Count: None expected due to no prior prescriptions written by our practice. No notes on file Pharmacokinetics: Liberation and absorption (onset of action): WNL Distribution (time to peak effect): WNL Metabolism and excretion (duration of action): WNL         Pharmacodynamics: Desired effects: Analgesia: [name] reports >50% benefit. Functional ability: Patient reports that medication allows her to accomplish basic ADLs Clinically meaningful improvement in function (CMIF): Sustained CMIF goals met Perceived effectiveness: Described as  relatively effective, allowing for increase in activities of daily living (ADL) Undesirable effects: Side-effects or Adverse reactions: None reported Monitoring: [place] PMP: Online review of the past 56-monthperiod previously conducted. Not applicable at this point since we have not taken over the patient's medication management yet. List of other Serum/Urine Drug Screening Test(s):  No results found for: AMPHSCRSER, BARBSCRSER, BENZOSCRSER, COCAINSCRSER, COCAINSCRNUR, PMel[place] THCSCRSER, THCU, [place] of [place] UDS test(s) done:  Lab Results  Component Value Date   SUMMARY FINAL 12/12/2016   Last UDS on record: Summary  Date Value Ref Range Status  12/12/2016 FINAL  Final    Comment:    ==================================================================== TOXASSURE COMP DRUG ANALYSIS,UR ==================================================================== Test                             Result       Flag       Units Drug Present and Declared for Prescription Verification   Alprazolam                     29           EXPECTED   ng/mg creat   Alpha-hydroxyalprazolam        47           EXPECTED   ng/mg creat    Source of alprazolam is a scheduled prescription medication.    Alpha-hydroxyalprazolam is an expected metabolite of alprazolam.   Gabapentin  PRESENT      EXPECTED   Citalopram                     PRESENT      EXPECTED   Desmethylcitalopram            PRESENT      EXPECTED    Desmethylcitalopram is an expected metabolite of citalopram or    the enantiomeric form, escitalopram.   Aripiprazole                   PRESENT      EXPECTED   Diltiazem                      PRESENT      EXPECTED   Metoprolol                     PRESENT      EXPECTED Drug Present not Declared for Prescription Verification   Tramadol                       262          UNEXPECTED ng/mg creat   O-Desmethyltramadol            146           UNEXPECTED ng/mg creat    Source of tramadol is a prescription medication.    O-desmethyltramadol is an expected metabolite of tramadol.   Acetaminophen                  PRESENT      UNEXPECTED   Ibuprofen                      PRESENT      UNEXPECTED   Naproxen                       PRESENT      UNEXPECTED Drug Absent but Declared for Prescription Verification   Diclofenac                     Not Detected UNEXPECTED    Diclofenac, as indicated in the declared medication list, is not    always detected even when used as directed.   Lidocaine                      Not Detected UNEXPECTED    Lidocaine, as indicated in the declared medication list, is not    always detected even when used as directed. ==================================================================== Test                      Result    Flag   Units      Ref Range   Creatinine              118              mg/dL      >=20 ==================================================================== Declared Medications:  The flagging and interpretation on this report are based on the  following declared medications.  Unexpected results may arise from  inaccuracies in the declared medications.  **Note: The testing scope of this panel includes these medications:  Alprazolam (Xanax)  Aripiprazole (Abilify)  Diltiazem (Cardizem)  Escitalopram (Lexapro)  Gabapentin (Neurontin)  Metoprolol (Toprol)  **Note: The testing scope of this panel does not include  small to  moderate amounts of these reported medications:  Diclofenac (Voltaren)  Lidocaine (Lidoderm)  **Note: The testing scope of this panel does not include following  reported medications:  Albuterol (Proventil)  Beclomethasone (QVAR)  Bumetanide (Bumex)  Losartan (Cozaar)  Montelukast (Singulair)  Potassium (K-Dur)  Potassium (Klor-Con) ==================================================================== For clinical consultation, please call [phone]. ====================================================================    UDS interpretation: No unexpected findings.          Medication Assessment Form: Patient introduced to form today Treatment compliance: Treatment may start today if patient agrees with proposed plan. Evaluation of compliance is not applicable at this point Risk Assessment Profile: Aberrant behavior: See initial evaluations. None observed or detected today Comorbid factors increasing risk of overdose: See initial evaluation. No additional risks detected today Medical Psychology Evaluation: Please see scanned results in medical record. Opioid Risk Tool - 01/08/17 1021      Family History of Substance Abuse   Alcohol  Positive Female    Illegal Drugs  Negative      Personal History of Substance Abuse   Alcohol  Negative    Illegal Drugs  Negative    Rx Drugs  Negative      Total Score   Opioid Risk Tool Scoring  1    Opioid Risk Interpretation  Low Risk      ORT Scoring interpretation table:  Score <3 = Low Risk for SUD  Score between 4-7 = Moderate Risk for SUD  Score >8 = High Risk for Opioid Abuse   Risk Mitigation Strategies:  Patient opioid safety counseling: Completed today. Counseling provided to patient as per "Patient Counseling Document". Document signed by patient, attesting to counseling and understanding Patient-Prescriber Agreement (PPA): Obtained today.  Controlled substance notification to other providers: Written and sent today.  Pharmacologic Plan: Today we may be taking over the patient's pharmacological regimen. See below             Laboratory Chemistry  Inflammation Markers (CRP: Acute Phase) (ESR: Chronic Phase) Lab Results  Component Value Date   CRP 58.7 (H) 12/12/2016   ESRSEDRATE 59 (H) 12/12/2016                 Rheumatology Markers Lab Results  Component Value Date   RF <10.0 01/08/2017   ANA Positive (A) 01/08/2017                Renal Function  Markers Lab Results  Component Value Date   BUN 18 12/30/2016   CREATININE 0.68 12/30/2016   GFRAA >60 12/30/2016   GFRNONAA >60 12/30/2016                 Hepatic Function Markers Lab Results  Component Value Date   AST 17 12/30/2016   ALT 13 (L) 12/30/2016   ALBUMIN 3.3 (L) 12/30/2016   ALKPHOS 87 12/30/2016                 Electrolytes Lab Results  Component Value Date   NA 136 12/30/2016   K 3.4 (L) 12/30/2016   CL 96 (L) 12/30/2016   CALCIUM 8.9 12/30/2016   MG 2.1 12/12/2016                 Neuropathy Markers Lab Results  Component Value Date   VITAMINB12 255 12/25/2016   HGBA1C 6.1 (H) 05/31/2016   HIV Non Reactive 12/25/2016                 Bone Pathology Markers  Lab Results  Component Value Date   25OHVITD1 9.1 (L) 12/12/2016   25OHVITD2 3.6 12/12/2016   25OHVITD3 5.5 12/12/2016                 Coagulation Parameters Lab Results  Component Value Date   PLT 313 12/30/2016   DDIMER <0.27 12/24/2016                 Cardiovascular Markers Lab Results  Component Value Date   BNP 29.0 12/30/2016   TROPONINI <0.03 12/30/2016   HGB 10.8 (L) 12/30/2016   HCT 34.7 (L) 12/30/2016                 CA Markers No results found for: CEA, CA125, LABCA2               Note: Lab results reviewed.  Recent Diagnostic Imaging Review   Complexity Note: No results found under the [facility] electronic medical record.                         Meds   Current Outpatient Medications:  .  albuterol (PROVENTIL HFA;VENTOLIN HFA) 108 (90 Base) MCG/ACT inhaler, Inhale 2-4 puffs by mouth every 4 hours as needed for wheezing, cough, and/or shortness of breath, Disp: 1 Inhaler, Rfl: 1 .  ALPRAZolam (XANAX) 0.5 MG tablet, Take 1 tablet by mouth daily as needed for anxiety. , Disp: , Rfl:  .  ARIPiprazole (ABILIFY) 2 MG tablet, Take 2 mg daily by mouth., Disp: , Rfl:  .  beclomethasone (QVAR) 40 MCG/ACT inhaler, Inhale 1 puff into the lungs 2 (two) times daily.,  Disp: 1 Inhaler, Rfl: 0 .  bumetanide (BUMEX) 2 MG tablet, Take 2-4 mg by mouth See admin instructions. 4 mg in the AM and 2 mg in the PM, Disp: , Rfl:  .  diclofenac sodium (VOLTAREN) 1 % GEL, Apply 4 (four) times daily topically., Disp: , Rfl:  .  diltiazem (CARDIZEM) 120 MG tablet, Take 120 mg daily by mouth., Disp: , Rfl:  .  escitalopram (LEXAPRO) 20 MG tablet, Take 20 mg by mouth daily. , Disp: , Rfl:  .  gabapentin (NEURONTIN) 100 MG capsule, Take 100-200 mg by mouth 3 (three) times daily., Disp: , Rfl:  .  lidocaine (LIDODERM) 5 %, Place 1 patch daily onto the skin. Remove & Discard patch within 12 hours or as directed by MD, Disp: , Rfl:  .  losartan (COZAAR) 25 MG tablet, Take 25 mg by mouth daily., Disp: , Rfl:  .  metolazone (ZAROXOLYN) 2.5 MG tablet, Take 1 tablet (2.5 mg total) by mouth daily., Disp: 3 tablet, Rfl: 0 .  metoprolol succinate (TOPROL-XL) 25 MG 24 hr tablet, Take 2 tablets daily by mouth. , Disp: , Rfl:  .  montelukast (SINGULAIR) 10 MG tablet, Take 10 mg by mouth at bedtime., Disp: , Rfl:  .  potassium chloride SA (K-DUR,KLOR-CON) 20 MEQ tablet, Take 2 tablets (40 mEq total) by mouth 2 (two) times daily. Taking 97mq QAM and 245m QPM, Disp: 120 tablet, Rfl: 5 .  Calcium Carb-Cholecalciferol (CALCIUM PLUS D3 ABSORBABLE) [phone] MG-UNIT CAPS, Take 1 capsule by mouth daily with breakfast., Disp: 90 capsule, Rfl: 0 .  Cholecalciferol 5000 units capsule, Take 1 capsule (5,000 Units total) by mouth daily., Disp: 90 capsule, Rfl: 0 .  ergocalciferol (VITAMIN D2) 50000 units capsule, Take 1 capsule (50,000 Units total) by mouth 2 (two) times a week. X 6 weeks., Disp:  12 capsule, Rfl: 0 .  traMADol (ULTRAM) 50 MG tablet, Take 1 tablet (50 mg total) by mouth every 8 (eight) hours as needed for severe pain., Disp: 90 tablet, Rfl: 0 No current facility-administered medications for this visit.   Facility-Administered Medications Ordered in Other Visits:  .  technetium tetrofosmin  (TC-MYOVIEW) injection 41.74 millicurie, 08.14 millicurie, Intravenous, Once PRN, [place], David A, MD  ROS  Constitutional: Denies any fever or chills Gastrointestinal: No reported hemesis, hematochezia, vomiting, or acute GI distress Musculoskeletal: Denies any acute onset joint swelling, redness, loss of ROM, or weakness Neurological: No reported episodes of acute onset apraxia, aphasia, dysarthria, agnosia, amnesia, paralysis, loss of coordination, or loss of consciousness  Allergies  Ms. Ackroyd has No Known Allergies.  [place]  Drug: Ms. Scaletta  reports that she does not use drugs. Alcohol:  reports that she drinks alcohol. Tobacco:  reports that she has been smoking cigarettes.  She has been smoking about 0.50 packs per day. she has never used smokeless tobacco. Medical:  has a past medical history of Anxiety, CHF (congestive heart failure) ([place]), Chronic heart failure ([place]), Morbid obesity ([place]), OSA (obstructive sleep apnea), Peripheral edema, and Scoliosis. Surgical: Ms. Duffey  has a past surgical history that includes Tubal ligation; Hernia repair; and scoliosis repair. Family: family history includes Diabetes in her mother; Hypertension in her mother.  Constitutional Exam  General appearance: Well nourished, well developed, and well hydrated. In no apparent acute distress. Morbidly obese. Vitals:   01/08/17 1003  BP: 117/74  Pulse: 88  Temp: 98.1 F (36.7 C)  SpO2: 96%  Weight: (!) 351 lb (159.2 kg)  Height: _0  (1.549 m)   BMI Assessment: Estimated body mass index is 66.32 kg/m as calculated from the following:   Height as of this encounter: _1  (1.549 m).   Weight as of this encounter: 351 lb (159.2 kg).  BMI interpretation table: BMI level Category Range association with higher incidence of chronic pain  <18 kg/m2 Underweight   18.5-24.9 kg/m2 Ideal body weight   25-29.9 kg/m2 Overweight Increased incidence by 20%  30-34.9 kg/m2 Obese (Class I) Increased incidence  by 68%  35-39.9 kg/m2 Severe obesity (Class II) Increased incidence by 136%  >40 kg/m2 Extreme obesity (Class III) Increased incidence by 254%   BMI Readings from Last 4 Encounters:  01/08/17 66.32 kg/m  01/01/17 67.93 kg/m  12/30/16 67.08 kg/m  12/25/16 67.17 kg/m   Wt Readings from Last 4 Encounters:  01/08/17 (!) 351 lb (159.2 kg)  01/01/17 (!) 359 lb 8 oz (163.1 kg)  12/30/16 (!) 355 lb (161 kg)  12/25/16 (!) 355 lb 8 oz (161.3 kg)  Psych/Mental status: Alert, oriented x 3 (person, place, & time)       Eyes: PERLA Respiratory: No evidence of acute respiratory distress  Cervical Spine Area Exam  Skin & Axial Inspection: No masses, redness, edema, swelling, or associated skin lesions Alignment: Symmetrical Functional ROM: Unrestricted ROM      Stability: No instability detected Muscle Tone/Strength: Functionally intact. No obvious neuro-muscular anomalies detected. Sensory (Neurological): Unimpaired Palpation: No palpable anomalies              Upper Extremity (UE) Exam    Side: Right upper extremity  Side: Left upper extremity  Skin & Extremity Inspection: Skin color, temperature, and hair growth are WNL. No peripheral edema or cyanosis. No masses, redness, swelling, asymmetry, or associated skin lesions. No contractures.  Skin & Extremity Inspection: Skin color, temperature, and hair  growth are WNL. No peripheral edema or cyanosis. No masses, redness, swelling, asymmetry, or associated skin lesions. No contractures.  Functional ROM: Unrestricted ROM          Functional ROM: Unrestricted ROM          Muscle Tone/Strength: Functionally intact. No obvious neuro-muscular anomalies detected.  Muscle Tone/Strength: Functionally intact. No obvious neuro-muscular anomalies detected.  Sensory (Neurological): Unimpaired          Sensory (Neurological): Unimpaired          Palpation: No palpable anomalies              Palpation: No palpable anomalies              Specialized Test(s):  Deferred         Specialized Test(s): Deferred          Thoracic Spine Area Exam  Skin & Axial Inspection: No masses, redness, or swelling Alignment: Symmetrical Functional ROM: Unrestricted ROM Stability: No instability detected Muscle Tone/Strength: Functionally intact. No obvious neuro-muscular anomalies detected. Sensory (Neurological): Unimpaired Muscle strength & Tone: No palpable anomalies  Lumbar Spine Area Exam  Skin & Axial Inspection: No masses, redness, or swelling Alignment: Symmetrical Functional ROM: Limited ROM      Stability: No instability detected Muscle Tone/Strength: Functionally intact. No obvious neuro-muscular anomalies detected. Sensory (Neurological): Movement-associated pain Palpation: Complains of area being tender to palpation       Provocative Tests: Lumbar Hyperextension and rotation test: evaluation deferred today       Lumbar Lateral bending test: evaluation deferred today       Patrick's Maneuver: evaluation deferred today                    Gait & Posture Assessment  Ambulation: Limited Gait: Modified gait pattern (slower gait speed, wider stride width, and longer stance duration) associated with morbid obesity Posture: Difficulty standing up straight, due to pain   Lower Extremity Exam    Side: Right lower extremity  Side: Left lower extremity  Skin & Extremity Inspection: Skin color, temperature, and hair growth are WNL. No peripheral edema or cyanosis. No masses, redness, swelling, asymmetry, or associated skin lesions. No contractures.  Skin & Extremity Inspection: Skin color, temperature, and hair growth are WNL. No peripheral edema or cyanosis. No masses, redness, swelling, asymmetry, or associated skin lesions. No contractures.  Functional ROM: Unrestricted ROM          Functional ROM: Unrestricted ROM          Muscle Tone/Strength: Functionally intact. No obvious neuro-muscular anomalies detected.  Muscle Tone/Strength: Functionally intact.  No obvious neuro-muscular anomalies detected.  Sensory (Neurological): Unimpaired  Sensory (Neurological): Unimpaired  Palpation: No palpable anomalies  Palpation: No palpable anomalies   Assessment & Plan  Primary Diagnosis & Pertinent Problem List: The primary encounter diagnosis was Chronic low back pain (Primary Area of Pain) (Bilateral) (L>R). Diagnoses of Chronic knee pain (Secondary Area of Pain) (Bilateral) (R>L), Chronic lower extremity pain (Tertiary Area of Pain) (Bilateral) (R>L), Chronic thoracic back pain (Fourth Area of Pain) (Bilateral) (R>L), Chronic pain syndrome, Failed back surgical syndrome, Neurogenic pain, Elevated C-reactive protein (CRP), Elevated sed rate, Vitamin D deficiency, Class 3 obesity with alveolar hypoventilation, serious comorbidity, and body mass index (BMI) of 60.0 to 69.9 in adult [facility]), and Obstructive sleep apnea on CPAP were also pertinent to this visit.  Visit Diagnosis: 1. Chronic low back pain (Primary Area of Pain) (Bilateral) (L>R)   2.  Chronic knee pain (Secondary Area of Pain) (Bilateral) (R>L)   3. Chronic lower extremity pain (Tertiary Area of Pain) (Bilateral) (R>L)   4. Chronic thoracic back pain (Fourth Area of Pain) (Bilateral) (R>L)   5. Chronic pain syndrome   6. Failed back surgical syndrome   7. Neurogenic pain   8. Elevated C-reactive protein (CRP)   9. Elevated sed rate   10. Vitamin D deficiency   11. Class 3 obesity with alveolar hypoventilation, serious comorbidity, and body mass index (BMI) of 60.0 to 69.9 in adult ([place])   12. Obstructive sleep apnea on CPAP    Problems updated and reviewed during this visit: No problems updated. Time Note: Greater than 50% of the 40 minute(s) of face-to-face time spent with Ms. Daddona, was spent in counseling/coordination of care regarding: the appropriate use of the pain scale, Ms. Bumbaugh's primary cause of pain, the results of her recent test(s), the significance of each one oth the test(s)  anomalies and it's corresponding characteristic pain pattern(s), the treatment plan, treatment alternatives, the risks and possible complications of proposed treatment, medication side effects, the opioid analgesic risks and possible complications, realistic expectations, the goals of pain management (increased in functionality), the need to bring and keep the BMI below 30, the medication agreement, the patient's responsibilities when it comes to controlled substances and the need to collect and read the AVS material.  Plan of Care  Pharmacotherapy (Medications Ordered): Meds ordered this encounter  Medications  . ergocalciferol (VITAMIN D2) 50000 units capsule    Sig: Take 1 capsule (50,000 Units total) by mouth 2 (two) times a week. X 6 weeks.    Dispense:  12 capsule    Refill:  0    Do not add this medication to the electronic "Automatic Refill" notification system. Patient may have prescription filled one day early if pharmacy is closed on scheduled refill date.  . Calcium Carb-Cholecalciferol (CALCIUM PLUS D3 ABSORBABLE) [phone] MG-UNIT CAPS    Sig: Take 1 capsule by mouth daily with breakfast.    Dispense:  90 capsule    Refill:  0    Do not place medication on "Automatic Refill". Fill one day early if pharmacy is closed on scheduled refill date.  . Cholecalciferol 5000 units capsule    Sig: Take 1 capsule (5,000 Units total) by mouth daily.    Dispense:  90 capsule    Refill:  0    Do not place medication on "Automatic Refill". Fill one day early if pharmacy is closed on scheduled refill date.  . traMADol (ULTRAM) 50 MG tablet    Sig: Take 1 tablet (50 mg total) by mouth every 8 (eight) hours as needed for severe pain.    Dispense:  90 tablet    Refill:  0    Do not place this medication, or any other prescription from our practice, on "Automatic Refill". Patient may have prescription filled one day early if pharmacy is closed on scheduled refill date. Do not fill until:  01/08/17 To last until: 02/07/17   Procedure Orders    No procedure(s) ordered today    Lab Orders     Rheumatoid factor     ANA w/Reflex if Positive Imaging Orders  No imaging studies ordered today    Referral Orders     Amb ref to Medical Nutrition Therapy-MNT  Pharmacological management options:  Opioid Analgesics: We'll take over management today. See above orders Membrane stabilizer: We have discussed the possibility of optimizing this  mode of therapy, if tolerated Muscle relaxant: We have discussed the possibility of a trial NSAID: We have discussed the possibility of a trial Other analgesic(s): To be determined at a later time   Interventional management options: Planned, scheduled, and/or pending:    No procedures until BMI is below 40 kg/m2. Her morbid obesity is adversely affecting her pain and OA. I recommend proceeding with bariatric surgery to assist her in binging her BMI down.  Upon the patient's return check results of rheumatology testing and refer patient to Rheumatology, if positive, for evaluation and treatment. Consider referral to Endocrinology to evaluate for organic causes of morbid obesity, and assist treatment, if present.   Considering:   Diagnostic bilateral LESI  Diagnostic bilateral lumbar facet nerve block  Possible bilateral lumbar facet RFA  Bilateral Hyalgan series  Diagnostic bilateral intra-articular knee injection  Diagnostic bilateral genicular nerve block  Possible bilateral genicular RFA    PRN Procedures:   None at this time   Provider-requested follow-up: Return in about 1 month (around 02/08/2017) for Med-Mgmt (w/ Dionisio David, NP).  Future Appointments  Date Time Provider Golf  01/13/2017  1:40 PM Alisa Graff, FNP [facility] None  02/04/2017  8:45 AM Vevelyn Francois, NP [facility] None  06/12/2017  8:20 AM Alisa Graff, FNP [facility] None    Primary Care Physician: Derinda Late, MD Location: [facility]  Outpatient Pain Management Facility Note by: Gaspar Cola, MD Date: 01/08/2017; Time: 11:51 AM

## 2017-01-08 NOTE — Progress Notes (Deleted)
Patient ID: Tracey White, female    DOB: [birth year], 36 y.o.   MRN: [id number]  HPI  Ms Tracey White is a 36 y/o female with a history of anxiety, obstructive sleep apnea (with nasal CPAP), scoliosis, recent tobacco use and chronic heart failure.   Reviewed last echo report done 10/19/15 which showed an EF of 65-70% along with trivial TR.   Was in the ED 12/30/16 due to syncope where she was treated and released. Admitted 12/24/16 due to asthma exacerbation/bronchitis. Was given solumedrol, levaquin and nebulizers. Discharged home after 2 days with an extended prednisone taper. Was in the ED 12/05/16 due to worsening back pain and HF. Was treated and released. Presented to the ED 10/26/16 and left without being seen. Was in the ED 08/05/16 with peripheral edema. Was treated with bumex with good response and discharged home. Was in the ED 07/17/16 due to low back pain along with peripheral edema. + bulging lumbar disc; Negative for DVT, IV diuretics given and she was released home. Was in the ED 07/04/16 due to chest pain & dizziness due to extra diuretic that patient took on her own at home. Treated and released.    She presents today for a follow-up visit with a chief complaint of moderate shortness of breath upon minimal exertion. She describes this as chronic in nature having been present for several years with varying levels of severity. She has associated fatigue, cough, light-headedness, weakness, edema, abdominal distention and weight gain. She denies any chest pain, palpitations or difficulty sleeping.    Past Medical History:  Diagnosis Date  . Anxiety   . CHF (congestive heart failure) (HCC)   . Chronic heart failure (HCC)   . Morbid obesity (HCC)   . OSA (obstructive sleep apnea)    uses CPAP at home  . Peripheral edema   . Scoliosis    Past Surgical History:  Procedure Laterality Date  . HERNIA REPAIR    . scoliosis repair    . TUBAL LIGATION     Family History  Problem Relation Age of  Onset  . Diabetes Mother   . Hypertension Mother    Social History   Tobacco Use  . Smoking status: Current Every Day Smoker    Packs/day: 0.50    Types: Cigarettes  . Smokeless tobacco: Never Used  Substance Use Topics  . Alcohol use: Yes    Comment: occasionally   No Known Allergies   Review of Systems  Constitutional: Positive for fatigue (better with CPAP). Negative for appetite change.  HENT: Positive for congestion. Negative for postnasal drip and sore throat.   Eyes: Negative.   Respiratory: Positive for cough and shortness of breath. Negative for chest tightness and wheezing.   Cardiovascular: Positive for leg swelling. Negative for chest pain and palpitations.  Gastrointestinal: Positive for abdominal distention. Negative for abdominal pain.  Endocrine: Negative.   Genitourinary: Negative.   Musculoskeletal: Positive for arthralgias (knees) and back pain.  Skin: Negative.   Allergic/Immunologic: Negative.   Neurological: Positive for weakness and light-headedness. Negative for dizziness.  Hematological: Negative for adenopathy. Does not bruise/bleed easily.  Psychiatric/Behavioral: Positive for dysphoric mood. Negative for sleep disturbance (has resumed wearing CPAP) and suicidal ideas. The patient is not nervous/anxious.      Lab Results  Component Value Date   CREATININE 0.68 12/30/2016   CREATININE 0.68 12/25/2016   CREATININE 0.71 12/24/2016    Physical Exam  Constitutional: She is oriented to person, place, and time. She appears  well-developed and well-nourished.  HENT:  Head: Normocephalic and atraumatic.  Neck: Normal range of motion. Neck supple. No JVD present.  Cardiovascular: Normal rate and regular rhythm.  Pulmonary/Chest: Effort normal. She has no wheezes. She has no rhonchi. She has no rales.  Abdominal: Soft. She exhibits distension. There is no tenderness.  Musculoskeletal: She exhibits edema (2+ pitting edema). She exhibits no tenderness.   Neurological: She is alert and oriented to person, place, and time.  Skin: Skin is warm and dry.  Psychiatric: She has a normal mood and affect. Her behavior is normal. Thought content normal.  Nursing note and vitals reviewed.  Assessment & Plan:  1: Acute heart failure with preserved ejection fraction- - NYHA class III - moderately fluid overloaded today -weighing daily. Reminded her to call for an overnight weight gain of >2 pounds or a weekly weight gain of >5 pounds. Says that at home yesterday she weighed 354 pounds and this morning she weighed 366 pounds - weight up 17 pounds since she was last here - not adding salt and has been trying to read food labels more often - saw cardiologist Juliann Pares([place]) 11/14/16 - BMP from 12/30/16 shows sodium 136, potassium 3.4 and GFR >60 - beginning the process of looking into bariatric surgery - will add metolazone 2.5mg  daily for the next 3 days - will increase her daily potassium to 40meq BID (due to potassium being 3.4) but she needs to take an additional 40meq daily while taking the metolazone  2: Depression- - feels like her depression is worsening because she feels so bad - had been working up till her syncopal event - has an appointment with a new PCP on 01/06/17  3: Lymphedema- - stage 1 lymphedema with slight improvement after elevation - edema returns after sitting/walking even for short periods of time - had been wearing compression boots with improvement in her edema but is now waiting on a part  4: Tobacco use- - had recently started taking chantix 2 days ago and since then, she feels like her anxiety/depression is worse and she just "feels bad" - advised her to stop the chantix for now and talk to her new PCP about this - encouraged her to not resume smoking  Patient did not bring her medications nor a list. Each medication was verbally reviewed with the patient and she was encouraged to bring the bottles to every visit to  confirm accuracy of list.  Return in 1 week or sooner for any questions/problems before then. Will get a BMP at this time.

## 2017-01-09 ENCOUNTER — Telehealth: Payer: Self-pay

## 2017-01-09 LAB — ANA W/REFLEX IF POSITIVE
Anti JO-1: <0.2 AI (ref 0.0–0.9)
Anti Nuclear Antibody(ANA): POSITIVE — AB
CENTROMERE AB SCREEN: 1.4 AI — AB (ref 0.0–0.9)
Chromatin Ab SerPl-aCnc: 0.2 AI (ref 0.0–0.9)
DSDNA AB: 1 [IU]/mL (ref 0–9)
ENA SSA (RO) Ab: <0.2 AI (ref 0.0–0.9)
ENA SSB (LA) Ab: <0.2 AI (ref 0.0–0.9)
Scleroderma SCL-70: <0.2 AI (ref 0.0–0.9)

## 2017-01-09 LAB — RHEUMATOID FACTOR: Rhuematoid fact SerPl-aCnc: <10 IU/mL (ref 0.0–13.9)

## 2017-01-09 NOTE — Telephone Encounter (Signed)
Patient states she spoke with pharmacy and medicaid and they have not received prior auth. Please call patient back.

## 2017-01-10 NOTE — Telephone Encounter (Signed)
PA was sent yesterday around 2:30. Request was received yesterday also12/13/18. Informed pt

## 2017-01-13 ENCOUNTER — Ambulatory Visit: Payer: Medicaid Other | Attending: Family | Admitting: Family

## 2017-01-13 ENCOUNTER — Encounter: Payer: Self-pay | Admitting: Family

## 2017-01-13 VITALS — BP 118/80 | HR 117 | Resp 22 | Ht 61.0 in | Wt 353.4 lb

## 2017-01-13 DIAGNOSIS — Z6841 Body Mass Index (BMI) 40.0 and over, adult: Secondary | ICD-10-CM | POA: Insufficient documentation

## 2017-01-13 DIAGNOSIS — F329 Major depressive disorder, single episode, unspecified: Secondary | ICD-10-CM | POA: Diagnosis not present

## 2017-01-13 DIAGNOSIS — I5032 Chronic diastolic (congestive) heart failure: Secondary | ICD-10-CM | POA: Diagnosis not present

## 2017-01-13 DIAGNOSIS — F17210 Nicotine dependence, cigarettes, uncomplicated: Secondary | ICD-10-CM | POA: Diagnosis not present

## 2017-01-13 DIAGNOSIS — Z833 Family history of diabetes mellitus: Secondary | ICD-10-CM | POA: Insufficient documentation

## 2017-01-13 DIAGNOSIS — G4733 Obstructive sleep apnea (adult) (pediatric): Secondary | ICD-10-CM | POA: Diagnosis not present

## 2017-01-13 DIAGNOSIS — I890 Lymphedema, not elsewhere classified: Secondary | ICD-10-CM

## 2017-01-13 DIAGNOSIS — Z8249 Family history of ischemic heart disease and other diseases of the circulatory system: Secondary | ICD-10-CM | POA: Diagnosis not present

## 2017-01-13 DIAGNOSIS — J45901 Unspecified asthma with (acute) exacerbation: Secondary | ICD-10-CM | POA: Insufficient documentation

## 2017-01-13 DIAGNOSIS — M419 Scoliosis, unspecified: Secondary | ICD-10-CM | POA: Insufficient documentation

## 2017-01-13 DIAGNOSIS — F419 Anxiety disorder, unspecified: Secondary | ICD-10-CM | POA: Insufficient documentation

## 2017-01-13 DIAGNOSIS — Z720 Tobacco use: Secondary | ICD-10-CM

## 2017-01-13 DIAGNOSIS — Z9851 Tubal ligation status: Secondary | ICD-10-CM | POA: Insufficient documentation

## 2017-01-13 DIAGNOSIS — Z9889 Other specified postprocedural states: Secondary | ICD-10-CM | POA: Diagnosis not present

## 2017-01-13 DIAGNOSIS — Z79899 Other long term (current) drug therapy: Secondary | ICD-10-CM | POA: Diagnosis not present

## 2017-01-13 LAB — BASIC METABOLIC PANEL
Anion gap: 9 (ref 5–15)
BUN: 12 mg/dL (ref 6–20)
CALCIUM: 8.9 mg/dL (ref 8.9–10.3)
CO2: 32 mmol/L (ref 22–32)
CREATININE: 0.76 mg/dL (ref 0.44–1.00)
Chloride: 96 mmol/L — ABNORMAL LOW (ref 101–111)
Glucose, Bld: 96 mg/dL (ref 65–99)
Potassium: 3.3 mmol/L — ABNORMAL LOW (ref 3.5–5.1)
SODIUM: 137 mmol/L (ref 135–145)

## 2017-01-13 NOTE — Patient Instructions (Signed)
Continue weighing daily and call for an overnight weight gain of > 2 pounds or a weekly weight gain of >5 pounds. 

## 2017-01-13 NOTE — Progress Notes (Signed)
Patient ID: Tracey White, female    DOB: [birth year], 36 y.o.   MRN: [id number]  HPI  Tracey White is a 36 y/o female with a history of anxiety, obstructive sleep apnea (with nasal CPAP), scoliosis, recent tobacco use and chronic heart failure.   Reviewed last echo report done 10/19/15 which showed an EF of 65-70% along with trivial TR.   Was in the ED 12/30/16 due to syncope where she was treated and released. Admitted 12/24/16 due to asthma exacerbation/bronchitis. Was given solumedrol, levaquin and nebulizers. Discharged home after 2 days with an extended prednisone taper. Was in the ED 12/05/16 due to worsening back pain and HF. Was treated and released. Presented to the ED 10/26/16 and left without being seen. Was in the ED 08/05/16 with peripheral edema. Was treated with bumex with good response and discharged home. Was in the ED 07/17/16 due to low back pain along with peripheral edema. + bulging lumbar disc; Negative for DVT, IV diuretics given and she was released home. Was in the ED 07/04/16 due to chest pain & dizziness due to extra diuretic that patient took on her own at home. Treated and released.    She presents today for a follow-up visit with a chief complaint of moderate shortness of breath with minimal exertion. She describes this as chronic in nature having been present for several years with varying levels of severity. She has associated fatigue, cough and depression along with this. She denies any chest pain, edema, palpitations, difficulty sleeping, dizziness or weight gain.   Past Medical History:  Diagnosis Date  . Anxiety   . CHF (congestive heart failure) (HCC)   . Chronic heart failure (HCC)   . Morbid obesity (HCC)   . OSA (obstructive sleep apnea)    uses CPAP at home  . Peripheral edema   . Scoliosis    Past Surgical History:  Procedure Laterality Date  . HERNIA REPAIR    . scoliosis repair    . TUBAL LIGATION     Family History  Problem Relation Age of Onset  .  Diabetes Mother   . Hypertension Mother    Social History   Tobacco Use  . Smoking status: Current Every Day Smoker    Packs/day: 0.50    Types: Cigarettes  . Smokeless tobacco: Never Used  Substance Use Topics  . Alcohol use: Yes    Comment: occasionally   No Known Allergies  Prior to Admission medications   Medication Sig Start Date End Date Taking? Authorizing Provider  albuterol (PROVENTIL HFA;VENTOLIN HFA) 108 (90 Base) MCG/ACT inhaler Inhale 2-4 puffs by mouth every 4 hours as needed for wheezing, cough, and/or shortness of breath 05/12/16  Yes Loleta Jerrilyn, MD  ARIPiprazole (ABILIFY) 2 MG tablet Take 2 mg daily by mouth.   Yes [provider]  budesonide-formoterol (SYMBICORT) 160-4.5 MCG/ACT inhaler Inhale 2 puffs into the lungs 2 (two) times daily.   Yes [provider]  bumetanide (BUMEX) 2 MG tablet Take 2-4 mg by mouth See admin instructions. 4 mg in the AM and 2 mg in the PM   Yes [provider]  diclofenac sodium (VOLTAREN) 1 % GEL Apply 4 (four) times daily topically.   Yes [provider]  diltiazem (CARDIZEM) 120 MG tablet Take 120 mg daily by mouth.   Yes [provider]  escitalopram (LEXAPRO) 20 MG tablet Take 20 mg by mouth daily.    Yes [provider]  gabapentin (NEURONTIN) 100 MG  capsule Take 100 mg by mouth 3 (three) times daily.    Yes [provider]  lidocaine (LIDODERM) 5 % Place 1 patch daily onto the skin. Remove & Discard patch within 12 hours or as directed by MD   Yes [provider]  losartan (COZAAR) 25 MG tablet Take 25 mg by mouth daily.   Yes [provider]  meloxicam (MOBIC) 15 MG tablet Take 15 mg by mouth daily as needed for pain.   Yes [provider]  metolazone (ZAROXOLYN) 2.5 MG tablet Take 1 tablet (2.5 mg total) by mouth daily. 01/01/17 04/01/17 Yes Clarisa KindredHackney, Tina A, FNP  metoprolol succinate (TOPROL-XL) 25 MG 24 hr tablet Take 2 tablets daily by  mouth.  10/30/15  Yes [provider]  montelukast (SINGULAIR) 10 MG tablet Take 10 mg by mouth at bedtime.   Yes [provider]  potassium chloride SA (K-DUR,KLOR-CON) 20 MEQ tablet Take 2 tablets (40 mEq total) by mouth 2 (two) times daily. Taking 40meq QAM and 20meq QPM Patient taking differently: Take 40 mEq by mouth 2 (two) times daily.  01/01/17  Yes Clarisa KindredHackney, Tina A, FNP  traMADol (ULTRAM) 50 MG tablet Take 1 tablet (50 mg total) by mouth every 8 (eight) hours as needed for severe pain. 01/08/17 02/07/17 Yes Delano MetzNaveira, Francisco, MD  Calcium Carb-Cholecalciferol (CALCIUM PLUS D3 ABSORBABLE) [phone] MG-UNIT CAPS Take 1 capsule by mouth daily with breakfast. Patient not taking: Reported on 01/13/2017 01/08/17 04/08/17  Delano MetzNaveira, Francisco, MD  Cholecalciferol 5000 units capsule Take 1 capsule (5,000 Units total) by mouth daily. Patient not taking: Reported on 01/13/2017 01/08/17 04/08/17  Delano MetzNaveira, Francisco, MD  ergocalciferol (VITAMIN D2) 50000 units capsule Take 1 capsule (50,000 Units total) by mouth 2 (two) times a week. X 6 weeks. Patient not taking: Reported on 01/13/2017 01/09/17 02/20/17  Delano MetzNaveira, Francisco, MD   Review of Systems  Constitutional: Positive for fatigue (better with CPAP). Negative for appetite change.  HENT: Negative for congestion, postnasal drip and sore throat.   Eyes: Negative.   Respiratory: Positive for cough and shortness of breath. Negative for chest tightness and wheezing.   Cardiovascular: Negative for chest pain, palpitations and leg swelling.  Gastrointestinal: Negative for abdominal distention and abdominal pain.  Endocrine: Negative.   Genitourinary: Negative.   Musculoskeletal: Positive for arthralgias (knees) and back pain.  Skin: Negative.   Allergic/Immunologic: Negative.   Neurological: Negative for dizziness, weakness and light-headedness.  Hematological: Negative for adenopathy. Does not bruise/bleed easily.   Psychiatric/Behavioral: Positive for dysphoric mood. Negative for sleep disturbance (has resumed wearing CPAP) and suicidal ideas. The patient is not nervous/anxious.    Vitals:   01/13/17 1407  BP: 118/80  Pulse: (!) 117  Resp: (!) 22  SpO2: 90%  Weight: (!) 353 lb 6 oz (160.3 kg)  Height: 5\' 1"  (1.549 m)   Wt Readings from Last 3 Encounters:  01/14/17 (!) 353 lb 11.2 oz (160.4 kg)  01/13/17 (!) 353 lb 6 oz (160.3 kg)  01/08/17 (!) 351 lb (159.2 kg)   Lab Results  Component Value Date   CREATININE 0.68 12/30/2016   CREATININE 0.68 12/25/2016   CREATININE 0.71 12/24/2016    Physical Exam  Constitutional: She is oriented to person, place, and time. She appears well-developed and well-nourished.  HENT:  Head: Normocephalic and atraumatic.  Neck: Normal range of motion. Neck supple. No JVD present.  Cardiovascular: Regular rhythm. Tachycardia present.  Pulmonary/Chest: Effort normal. She has no wheezes. She has no rhonchi. She has  no rales.  Abdominal: Soft. She exhibits no distension. There is no tenderness.  Musculoskeletal: She exhibits no edema or tenderness.  Neurological: She is alert and oriented to person, place, and time.  Skin: Skin is warm and dry.  Psychiatric: She has a normal mood and affect. Her behavior is normal. Thought content normal.  Nursing note and vitals reviewed.  Assessment & Plan:  1: Chronic heart failure with preserved ejection fraction- - NYHA class III - euvolemic today today -weighing daily. Reminded her to call for an overnight weight gain of >2 pounds or a weekly weight gain of >5 pounds.  - weight down 6 pounds since she was last here - not adding salt and has been trying to read food labels more often. Recently was treated with prednisone and ate a whole jar of pickles - saw cardiologist Juliann Pares([place]) 11/14/16 - BMP from 12/30/16 shows sodium 136, potassium 3.4 and GFR >60 - beginning the process of looking into bariatric surgery - will  check a BMP today since patient has taken metolazone since she was last here - Pharm D reconciled medications with the patient  2: Depression- - feels like her depression is worsening because she feels so bad - continues to not work - has recently been told that her vitamin D and iron levels are low. Is supposed to start vitamin D but hasn't picked that RX up yet. Encouraged her to do so as soon as possible.  - has an appointment with a new PCP on 01/06/17  3: Lymphedema- - stage 1 lymphedema  - has been wearing compression boots daily with marked improvement in her edema. Currently has no edema present  4: Tobacco use- - has started chantix but also smokes a few cigarettes daily as her stress reliever - discussed complete cessation for 3 minutes with her  Patient did not bring her medications nor a list. Each medication was verbally reviewed with the patient and she was encouraged to bring the bottles to every visit to confirm accuracy of list.  Return in 5 months or sooner for any questions/problems before then.

## 2017-01-14 ENCOUNTER — Encounter: Payer: Medicaid Other | Attending: Pain Medicine | Admitting: Dietician

## 2017-01-14 ENCOUNTER — Encounter: Payer: Self-pay | Admitting: Dietician

## 2017-01-14 ENCOUNTER — Telehealth: Payer: Self-pay | Admitting: Family

## 2017-01-14 VITALS — Ht 61.0 in | Wt 353.7 lb

## 2017-01-14 DIAGNOSIS — Z713 Dietary counseling and surveillance: Secondary | ICD-10-CM | POA: Diagnosis present

## 2017-01-14 DIAGNOSIS — Z6841 Body Mass Index (BMI) 40.0 and over, adult: Secondary | ICD-10-CM

## 2017-01-14 DIAGNOSIS — G894 Chronic pain syndrome: Secondary | ICD-10-CM | POA: Diagnosis not present

## 2017-01-14 DIAGNOSIS — R0602 Shortness of breath: Secondary | ICD-10-CM | POA: Insufficient documentation

## 2017-01-14 DIAGNOSIS — E662 Morbid (severe) obesity with alveolar hypoventilation: Secondary | ICD-10-CM | POA: Diagnosis not present

## 2017-01-14 NOTE — Telephone Encounter (Signed)
Spoke with patient regarding lab results from yesterday (01/13/17). Renal function looks good but potassium has declined slightly to 3.3. She is currently taking 40meq potassium AM and 20meq potassium PM. She's also taking bumex 4mg  AM and 2mg  PM.   Advised patient to increase her potassium to 40meq twice daily and if she can get by without taking the PM dose of bumex, to just take the 4mg  of bumex in the morning.   Patient verbalized understanding of changes.

## 2017-01-14 NOTE — Progress Notes (Signed)
Medical Nutrition Therapy: Visit start time: 1530  end time: 1630  Assessment:  Diagnosis: obesity, chronic pain Past medical history: Diabetes, HTN, CHF, sleep apnea, shortness of breath Psychosocial issues/ stress concerns: history of depression, patient reports high stress level Preferred learning method:  . Auditory . Hands-on   Current weight: 353.7lbs  Height: 5'1" Medications, supplements: reconciled list in medical record  Progress and evaluation: Patient reports highest weight at 366, due to fluid retention. Has not worked on any weight loss plans in the past. She is ready to work on weight loss now to help with pain and other health issues. Reports poor appetite, skipping meals due to lack of hunger. She is undergoing therapy for depression, which is in part due to her health issues. She is hopeful that weight loss will help her feel better overall. She has made some changes already, including stopping consumption of sodas, of which she was drinking several daily. She is also decreasing cigarette smoking with plans to quit.    Physical activity: none  Dietary Intake:  Usual eating pattern includes 2 meals and 0-1 snacks per day. Dining out frequency: 6-10 meals per week.  Breakfast: none; was eating cereal but became bored with it.  Snack: none Lunch: fast foods (if money avail), hot dog today; sometimes small salad. Usually quick, simple meal.  Snack: occasionally chips or crackers but often none Supper: more cooking at home recently; sometimes 9-10pm -- chicken, pork chops, spaghetti, generally meat, veg, and starch. Dessert -- cookies, brownies Snack: none recently Beverages: Propel water as of yesterday to replace sodas.   Nutrition Care Education: Topics covered: weight management, anti-inflammatory diet Basic nutrition: basic food groups, appropriate nutrient balance, appropriate meal and snack schedule, general nutrition guidelines    Weight control: benefits of weight  control, importance of low fat and low sugar food choices, strategies to increase metabolism, including eating small amounts at regular intervals throughout the day, balanced meal and snack options.  Anti-inflammatory diet: importance of vegetable and fruit intake, whole grains, healthy fats, potential benefits of spices and herbs, tea; limiting saturated fat and added sugars.   Nutritional Diagnosis:  [place]-3.3 Overweight/obesity As related to excess calories, inactivity.  As evidenced by BMI 66.8, patient report. NI-5.11.1 Predicted suboptimal nutrient intake As related to poor appetite, skipped meals, financial limitations.  As evidenced by patient report.  Intervention: Instruction as noted above.   Set goals with input from patient.   Commended patient for changes in progress.    She will work to eat small amounts more often during the day and will investigate options for safe exercise.   Education Materials given:  . Fighting Inflammation . Plate planner with food lists . Goals/ instructions  Learner/ who was taught:  . Patient   Level of understanding: Marland Kitchen. Verbalizes/ demonstrates competency  Demonstrated degree of understanding via:   Teach back Learning barriers: . None  Willingness to learn/ readiness for change: . Eager, change in progress  Monitoring and Evaluation:  Dietary intake, exercise, pain, stress, and body weight      follow up: 02/19/16

## 2017-01-14 NOTE — Addendum Note (Signed)
Addended by: Clarisa KindredHACKNEY, Amalee Olsen A on: 01/14/2017 08:51 PM   Modules accepted: Orders

## 2017-01-14 NOTE — Patient Instructions (Signed)
   Plan to eat something every 3-5 hours during the day.   Have a light breakfast meal, such as toast with peanut butter, fruit with a cheese stick or slice, granola bar with nuts, breakfast drink like Carnation low sugar or Premier Protein.   Include 1-2 snacks each day as needed to break up long stretches between meals.   Great job switching away from sodas, this will help tremendously!  Contact the [facility] to check out options for safe and pain free exercise.

## 2017-01-16 ENCOUNTER — Telehealth: Payer: Self-pay | Admitting: Pain Medicine

## 2017-01-16 NOTE — Telephone Encounter (Signed)
Patient having extreme problems not taking anxiety meds due to taking tramadol. Please call asap

## 2017-01-16 NOTE — Telephone Encounter (Signed)
Patient instructed that Dr. Laban EmperorNaveira must have documentation from her psychiatrist that she needs to continue Xanax and that psych. is aware that she gets Tramadol from Dr. Laban EmperorNaveira. Patient states she will get her psych. to fax a letter.

## 2017-01-16 NOTE — Telephone Encounter (Signed)
Left voicemail with patient; 1.  Was she asked to stop taking anxiety med? 2.  Is Lexapro the medication that she is asking about 3.  No contraindication in taking together, unless she was other wise instructed.    Ask to please call back if she has any additional questions.

## 2017-01-17 ENCOUNTER — Ambulatory Visit
Admission: EM | Admit: 2017-01-17 | Discharge: 2017-01-17 | Disposition: A | Discharge status: Home / Self Care | Payer: Medicaid Other | Attending: Family | Admitting: Family

## 2017-01-17 ENCOUNTER — Other Ambulatory Visit: Payer: Self-pay

## 2017-01-17 DIAGNOSIS — R05 Cough: Secondary | ICD-10-CM | POA: Diagnosis not present

## 2017-01-17 DIAGNOSIS — Z713 Dietary counseling and surveillance: Secondary | ICD-10-CM | POA: Diagnosis not present

## 2017-01-17 DIAGNOSIS — R0602 Shortness of breath: Secondary | ICD-10-CM | POA: Diagnosis not present

## 2017-01-17 DIAGNOSIS — R059 Cough, unspecified: Secondary | ICD-10-CM

## 2017-01-17 DIAGNOSIS — R110 Nausea: Secondary | ICD-10-CM | POA: Diagnosis not present

## 2017-01-17 NOTE — ED Provider Notes (Addendum)
[facility]    CSN: [id number] Arrival date & time: 01/17/17  1049     History   Chief Complaint Chief Complaint  Patient presents with  . Shortness of Breath    HPI Tracey White is a 36 y.o. female.   36 year old female presents with nausea that started 4 days ago. She thought she was nauseous due to multiple medications and changes in Fe and K tablets that she was taking. The nausea has continued and now she has developed a cough and shortness of breath that has become worse today. She is very fatigued and 'jittery" since she has not been able to take her chronic medications since Monday night due to the severe nausea. She has a history of CHF and possible COPD, as well as anxiety, chronic pain and sleep apnea. She denies any fever, vomiting, diarrhea or abdominal pain. She has been able to eat solid foods but has little appetite. She has slight nasal congestion but does not feel like she has a cold and denies any fever. She denies that she could be pregnant- she has not been sexually active in over 1 year. She had called her PCP today and they advised her to go to ER or Urgent Care.    The history is provided by the patient.    Past Medical History:  Diagnosis Date  . Anxiety   . CHF (congestive heart failure) (HCC)   . Chronic heart failure (HCC)   . Morbid obesity (HCC)   . OSA (obstructive sleep apnea)    uses CPAP at home  . Peripheral edema   . Scoliosis     Patient Active Problem List   Diagnosis Date Noted  . 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 Pharmacologic therapy 01/08/2017  . Problems influencing health status 01/08/2017  . Chronic lower extremity pain [facility] Area of Pain) (Bilateral) (R>L) 01/08/2017  . Elevated C-reactive protein (CRP) 01/08/2017  . Elevated sed rate 01/08/2017  . Vitamin D deficiency 01/08/2017  . Class 3 obesity with alveolar hypoventilation, serious comorbidity, and body mass index  (BMI) of 60.0 to 69.9 in adult (HCC) 01/08/2017  . Acute diastolic heart failure (HCC) 01/01/2017  . Anemia 12/25/2016  . Asthma without status asthmaticus 12/24/2016  . Chronic knee pain (Secondary Area of Pain) (Bilateral) (R>L) 12/12/2016  . Chronic thoracic back pain (Fourth Area of Pain) (Bilateral) (R>L) 12/12/2016  . Chronic pain syndrome 12/12/2016  . Disorder of skeletal system 12/12/2016  . Other long term (current) drug therapy 12/12/2016  . Scoliosis 12/12/2016  . Lymphedema 09/24/2016  . Skin lesion of left leg 09/05/2016  . Depression 08/27/2016  . Chronic low back pain (Primary Area of Pain) (Bilateral) (L>R) 08/27/2016  . Hypokalemia 06/06/2016  . Chest pain 05/31/2016  . Chronic diastolic heart failure (HCC) 05/15/2016  . Tachycardia 05/15/2016  . Obstructive sleep apnea on CPAP 05/15/2016  . Tobacco use 05/15/2016  . Chest pain at rest 10/18/2015    Past Surgical History:  Procedure Laterality Date  . HERNIA REPAIR    . scoliosis repair    . TUBAL LIGATION      OB History    No data available       Home Medications    Prior to Admission medications   Medication Sig Start Date End Date Taking? Authorizing Provider  albuterol (PROVENTIL HFA;VENTOLIN HFA) 108 (90 Base) MCG/ACT inhaler Inhale 2-4 puffs by mouth every 4 hours as needed  for wheezing, cough, and/or shortness of breath 05/12/16  Yes Loleta RoseForbach, Cory, MD  ARIPiprazole (ABILIFY) 2 MG tablet Take 2 mg daily by mouth.   Yes [provider]  budesonide-formoterol (SYMBICORT) 160-4.5 MCG/ACT inhaler Inhale 2 puffs into the lungs 2 (two) times daily.   Yes [provider]  bumetanide (BUMEX) 2 MG tablet Take 2-4 mg by mouth See admin instructions. 4 mg in the AM and 2 mg in the PM   Yes [provider]  Calcium Carb-Cholecalciferol (CALCIUM PLUS D3 ABSORBABLE) [phone] MG-UNIT CAPS Take 1 capsule by mouth daily with breakfast. 01/08/17 04/08/17 Yes Delano MetzNaveira, Francisco, MD    Cholecalciferol 5000 units capsule Take 1 capsule (5,000 Units total) by mouth daily. 01/08/17 04/08/17 Yes Delano MetzNaveira, Francisco, MD  diclofenac sodium (VOLTAREN) 1 % GEL Apply 4 (four) times daily topically.   Yes [provider]  diltiazem (CARDIZEM) 120 MG tablet Take 120 mg daily by mouth.   Yes [provider]  ergocalciferol (VITAMIN D2) 50000 units capsule Take 1 capsule (50,000 Units total) by mouth 2 (two) times a week. X 6 weeks. 01/09/17 02/20/17 Yes Delano MetzNaveira, Francisco, MD  escitalopram (LEXAPRO) 20 MG tablet Take 20 mg by mouth daily.    Yes [provider]  gabapentin (NEURONTIN) 100 MG capsule Take 100 mg by mouth 3 (three) times daily.    Yes [provider]  lidocaine (LIDODERM) 5 % Place 1 patch daily onto the skin. Remove & Discard patch within 12 hours or as directed by MD   Yes [provider]  losartan (COZAAR) 25 MG tablet Take 25 mg by mouth daily.   Yes [provider]  meloxicam (MOBIC) 15 MG tablet Take 15 mg by mouth daily as needed for pain.   Yes [provider]  metolazone (ZAROXOLYN) 2.5 MG tablet Take 1 tablet (2.5 mg total) by mouth daily. 01/01/17 04/01/17 Yes Clarisa KindredHackney, Tina A, FNP  metoprolol succinate (TOPROL-XL) 25 MG 24 hr tablet Take 2 tablets daily by mouth.  10/30/15  Yes [provider]  montelukast (SINGULAIR) 10 MG tablet Take 10 mg by mouth at bedtime.   Yes [provider]  potassium chloride SA (K-DUR,KLOR-CON) 20 MEQ tablet Take 40 mEq by mouth 2 (two) times daily.   Yes [provider]  traMADol (ULTRAM) 50 MG tablet Take 1 tablet (50 mg total) by mouth every 8 (eight) hours as needed for severe pain. 01/08/17 02/07/17 Yes Delano MetzNaveira, Francisco, MD    Family History Family History  Problem Relation Age of Onset  . Diabetes Mother   . Hypertension Mother     Social History Social History   Tobacco Use  . Smoking status: Current Every Day Smoker    Packs/day: 0.50     Types: Cigarettes  . Smokeless tobacco: Never Used  . Tobacco comment: decreasing   Substance Use Topics  . Alcohol use: Yes    Alcohol/week: 0.6 - 1.2 oz    Types: 1 - 2 Standard drinks or equivalent per week    Comment: occasionally  . Drug use: No     Allergies   Patient has no known allergies.   Review of Systems Review of Systems  Constitutional: Positive for activity change, appetite change and fatigue. Negative for chills and fever.  HENT: Positive for congestion (mild right nasal) and sinus pressure. Negative for ear discharge, ear pain, postnasal drip, rhinorrhea, sneezing, sore throat and trouble swallowing.   Eyes: Negative for pain, discharge, redness and itching.  Respiratory:  Positive for cough and shortness of breath. Negative for wheezing.   Cardiovascular: Positive for leg swelling. Negative for chest pain.  Gastrointestinal: Positive for nausea. Negative for abdominal pain, diarrhea and vomiting.  Skin: Negative for rash and wound.  Neurological: Positive for weakness. Negative for dizziness, syncope, facial asymmetry, light-headedness and numbness.  Hematological: Negative for adenopathy. Does not bruise/bleed easily.     Physical Exam Triage Vital Signs ED Triage Vitals  Enc Vitals Group     BP 01/17/17 1120 (!) 146/85     Pulse Rate 01/17/17 1120 (!) 110     Resp 01/17/17 1120 (!) 21     Temp 01/17/17 1120 98.2 F (36.8 C)     Temp Source 01/17/17 1120 Oral     SpO2 01/17/17 1120 94 %     Weight 01/17/17 1117 (!) 353 lb (160.1 kg)     Height 01/17/17 1117 5\' 1"  (1.549 m)     Head Circumference --      Peak Flow --      Pain Score 01/17/17 1117 5     Pain Loc --      Pain Edu? --      Excl. in GC? --    No data found.  Updated Vital Signs BP (!) 146/85 (BP Location: Left Arm)   Pulse (!) 110   Temp 98.2 F (36.8 C) (Oral)   Resp (!) 21   Ht 5\' 1"  (1.549 m)   Wt (!) 353 lb (160.1 kg)   LMP 12/23/2016   SpO2 94%   BMI 66.70 kg/m    Visual Acuity Right Eye Distance:   Left Eye Distance:   Bilateral Distance:    Right Eye Near:   Left Eye Near:    Bilateral Near:     Physical Exam  Constitutional: She is oriented to person, place, and time. She appears well-developed and well-nourished. No distress.  HENT:  Head: Normocephalic and atraumatic.  Right Ear: Hearing, tympanic membrane, external ear and ear canal normal.  Left Ear: Hearing, tympanic membrane, external ear and ear canal normal.  Nose: Mucosal edema present. No rhinorrhea. Right sinus exhibits maxillary sinus tenderness. Right sinus exhibits no frontal sinus tenderness. Left sinus exhibits no maxillary sinus tenderness and no frontal sinus tenderness.  Mouth/Throat: Uvula is midline, oropharynx is clear and moist and mucous membranes are normal. No posterior oropharyngeal edema.  Eyes: Conjunctivae and EOM are normal.  Neck: Normal range of motion. Neck supple.  Cardiovascular: Regular rhythm. Tachycardia present.  No murmur heard. Pulmonary/Chest: No accessory muscle usage. Tachypnea noted. She is in respiratory distress (mild). She has decreased breath sounds in the right upper field, the right middle field, the right lower field, the left upper field and the left lower field. She has no wheezes. She has no rhonchi. She has no rales.  Abdominal: Bowel sounds are normal. There is no tenderness.  Musculoskeletal: Normal range of motion.  Lymphadenopathy:    She has no cervical adenopathy.  Neurological: She is alert and oriented to person, place, and time.  Skin: Skin is warm and dry. Capillary refill takes less than 2 seconds.  Psychiatric: She has a normal mood and affect. Her behavior is normal.       UC Treatments / Results  Labs (all labs ordered are listed, but only abnormal results are displayed) Labs Reviewed - No data to display  EKG  EKG Interpretation None     Procedures ED EKG Date/Time: 01/17/2017 12:27 PM Performed by:  Keyleigh Manninen Berry, NP Authorized by: Sudie GrumblingAmyot, Charle Mclaurin Berry, NP   ECG reviewed by ED Physician in the absence of a cardiologist: no   Interpretation:    Interpretation: normal   Rate:    ECG rate:  [id number]   ECG rate assessment: normal   Rhythm:    Rhythm: sinus rhythm   Ectopy:    Ectopy: none   QRS:    QRS axis:  Normal Conduction:    Conduction: normal   ST segments:    ST segments:  Normal T waves:    T waves: normal   Comments:     Appears normal sinus rhythm with no distinct abnormalities   (including critical care time)  Medications Ordered in UC Medications - No data to display    Radiology No results found.  Procedures Procedures (including critical care time)  Medications Ordered in UC Medications - No data to display   Initial Impression / Assessment and Plan / UC Course  I have reviewed the triage vital signs and the nursing notes.  Pertinent labs & imaging results that were available during my care of the patient were reviewed by me and considered in my medical decision making (see chart for details).    Discussed with patient, since uncertain of cause of nausea and worsening of cough and shortness of breath with multiple possible etiologies, recommend go to ER for further evaluation. [facility] and talked with the charge nurse. She was concerned over Cardiac Etiology and may need transfer [facility]. EKG performed and no distinct abnormalities detected. Discussed with patient ER options and patient's friend will drive her to the ER at [facility] for [place].      Final Clinical Impressions(s) / UC Diagnoses   Final diagnoses:  Shortness of breath  Cough  Nausea without vomiting    ED Discharge Orders    None       Controlled Substance Prescriptions [place] Controlled Substance Registry consulted? Not Applicable   Sudie Grumblingmyot, Mariza Bourget Berry, NP 01/17/17 1239    Sudie GrumblingAmyot, Victorious Kundinger Berry, NP 01/18/17  [phone]

## 2017-01-17 NOTE — Discharge Instructions (Signed)
Due to uncertain of cause of nausea and with worsening of symptoms of cough and shortness of breath, recommend go to the ER now for further evaluation.

## 2017-01-17 NOTE — ED Triage Notes (Signed)
Patient complains of shortness of breath and coughing with nausea. Patient reports that she has been unable to take medications secondary to nausea. Patient reports that she has congestive heart failure.

## 2017-01-31 ENCOUNTER — Other Ambulatory Visit: Payer: Self-pay | Admitting: Family

## 2017-02-04 ENCOUNTER — Other Ambulatory Visit: Payer: Self-pay

## 2017-02-04 ENCOUNTER — Encounter: Payer: Self-pay | Admitting: Nurse Practitioner

## 2017-02-04 ENCOUNTER — Ambulatory Visit: Payer: Medicaid Other | Attending: Nurse Practitioner | Admitting: Nurse Practitioner

## 2017-02-04 VITALS — BP 115/79 | HR 108 | Temp 98.0°F | Resp 18 | Ht 61.0 in | Wt 341.0 lb

## 2017-02-04 DIAGNOSIS — M79604 Pain in right leg: Secondary | ICD-10-CM | POA: Diagnosis not present

## 2017-02-04 DIAGNOSIS — Z79899 Other long term (current) drug therapy: Secondary | ICD-10-CM | POA: Insufficient documentation

## 2017-02-04 DIAGNOSIS — M5442 Lumbago with sciatica, left side: Secondary | ICD-10-CM

## 2017-02-04 DIAGNOSIS — R768 Other specified abnormal immunological findings in serum: Secondary | ICD-10-CM | POA: Diagnosis not present

## 2017-02-04 DIAGNOSIS — M546 Pain in thoracic spine: Secondary | ICD-10-CM | POA: Diagnosis not present

## 2017-02-04 DIAGNOSIS — F329 Major depressive disorder, single episode, unspecified: Secondary | ICD-10-CM | POA: Diagnosis not present

## 2017-02-04 DIAGNOSIS — G8929 Other chronic pain: Secondary | ICD-10-CM | POA: Diagnosis not present

## 2017-02-04 DIAGNOSIS — I5032 Chronic diastolic (congestive) heart failure: Secondary | ICD-10-CM | POA: Insufficient documentation

## 2017-02-04 DIAGNOSIS — M419 Scoliosis, unspecified: Secondary | ICD-10-CM | POA: Diagnosis not present

## 2017-02-04 DIAGNOSIS — E876 Hypokalemia: Secondary | ICD-10-CM | POA: Insufficient documentation

## 2017-02-04 DIAGNOSIS — G894 Chronic pain syndrome: Secondary | ICD-10-CM | POA: Diagnosis not present

## 2017-02-04 DIAGNOSIS — E559 Vitamin D deficiency, unspecified: Secondary | ICD-10-CM | POA: Diagnosis not present

## 2017-02-04 DIAGNOSIS — G4733 Obstructive sleep apnea (adult) (pediatric): Secondary | ICD-10-CM | POA: Insufficient documentation

## 2017-02-04 DIAGNOSIS — Z5181 Encounter for therapeutic drug level monitoring: Secondary | ICD-10-CM | POA: Diagnosis present

## 2017-02-04 DIAGNOSIS — F419 Anxiety disorder, unspecified: Secondary | ICD-10-CM | POA: Diagnosis not present

## 2017-02-04 DIAGNOSIS — F17210 Nicotine dependence, cigarettes, uncomplicated: Secondary | ICD-10-CM | POA: Diagnosis not present

## 2017-02-04 DIAGNOSIS — M5441 Lumbago with sciatica, right side: Secondary | ICD-10-CM | POA: Diagnosis not present

## 2017-02-04 DIAGNOSIS — I890 Lymphedema, not elsewhere classified: Secondary | ICD-10-CM | POA: Diagnosis not present

## 2017-02-04 DIAGNOSIS — J45909 Unspecified asthma, uncomplicated: Secondary | ICD-10-CM | POA: Diagnosis not present

## 2017-02-04 DIAGNOSIS — M79605 Pain in left leg: Secondary | ICD-10-CM

## 2017-02-04 DIAGNOSIS — M25562 Pain in left knee: Secondary | ICD-10-CM | POA: Diagnosis not present

## 2017-02-04 DIAGNOSIS — M25561 Pain in right knee: Secondary | ICD-10-CM | POA: Diagnosis not present

## 2017-02-04 DIAGNOSIS — Z6841 Body Mass Index (BMI) 40.0 and over, adult: Secondary | ICD-10-CM | POA: Diagnosis not present

## 2017-02-04 MED ORDER — CHOLECALCIFEROL 125 MCG (5000 UT) PO CAPS: 5000.0000 [IU] | ORAL_CAPSULE | Freq: Every day | ORAL | 0 refills | Status: AC

## 2017-02-04 MED ORDER — TRAMADOL HCL 50 MG PO TABS: 50.0000 mg | ORAL_TABLET | Freq: Three times a day (TID) | ORAL | 0 refills | Status: DC | PRN

## 2017-02-04 MED ORDER — CALCIUM PLUS D3 ABSORBABLE 600-2500 MG-UNIT PO CAPS: 1.0000 | ORAL_CAPSULE | Freq: Every day | ORAL | 0 refills | Status: DC

## 2017-02-04 NOTE — Patient Instructions (Addendum)
____________________________________________________________________________________________  Medication Rules  Applies to: All patients receiving prescriptions (written or electronic).  Pharmacy of record: Pharmacy where electronic prescriptions will be sent. If written prescriptions are taken to a different pharmacy, please inform the nursing staff. The pharmacy listed in the electronic medical record should be the one where you would like electronic prescriptions to be sent.  Prescription refills: Only during scheduled appointments. Applies to both, written and electronic prescriptions.  NOTE: The following applies primarily to controlled substances (Opioid* Pain Medications).   Patient's responsibilities: 1. Pain Pills: Bring all pain pills to every appointment (except for procedure appointments). 2. Pill Bottles: Bring pills in original pharmacy bottle. Always bring newest bottle. Bring bottle, even if empty. 3. Medication refills: You are responsible for knowing and keeping track of what medications you need refilled. The day before your appointment, write a list of all prescriptions that need to be refilled. Bring that list to your appointment and give it to the admitting nurse. Prescriptions will be written only during appointments. If you forget a medication, it will not be "Called in", "Faxed", or "electronically sent". You will need to get another appointment to get these prescribed. 4. Prescription Accuracy: You are responsible for carefully inspecting your prescriptions before leaving our office. Have the discharge nurse carefully go over each prescription with you, before taking them home. Make sure that your name is accurately spelled, that your address is correct. Check the name and dose of your medication to make sure it is accurate. Check the number of pills, and the written instructions to make sure they are clear and accurate. Make sure that you are given enough medication to  last until your next medication refill appointment. 5. Taking Medication: Take medication as prescribed. Never take more pills than instructed. Never take medication more frequently than prescribed. Taking less pills or less frequently is permitted and encouraged, when it comes to controlled substances (written prescriptions).  6. Inform other Doctors: Always inform, all of your healthcare providers, of all the medications you take. 7. Pain Medication from other Providers: You are not allowed to accept any additional pain medication from any other Doctor or Healthcare provider. There are two exceptions to this rule. (see below) In the event that you require additional pain medication, you are responsible for notifying us, as stated below. 8. Medication Agreement: You are responsible for carefully reading and following our Medication Agreement. This must be signed before receiving any prescriptions from our practice. Safely store a copy of your signed Agreement. Violations to the Agreement will result in no further prescriptions. (Additional copies of our Medication Agreement are available upon request.) 9. Laws, Rules, & Regulations: All patients are expected to follow all Federal and State Laws, Statutes, Rules, & Regulations. Ignorance of the Laws does not constitute a valid excuse. The use of any illegal substances is prohibited. 10. Adopted CDC guidelines & recommendations: Target dosing levels will be at or below 60 MME/day. Use of benzodiazepines** is not recommended.  Exceptions: There are only two exceptions to the rule of not receiving pain medications from other Healthcare Providers. 1. Exception #1 (Emergencies): In the event of an emergency (i.e.: accident requiring emergency care), you are allowed to receive additional pain medication. However, you are responsible for: As soon as you are able, call our office [phone], at any time of the day or night, and leave a message stating your  name, the date and nature of the emergency, and the name and dose of the medication   prescribed. In the event that your call is answered by a member of our staff, make sure to document and save the date, time, and the name of the person that took your information.  2. Exception #2 (Planned Surgery): In the event that you are scheduled by another doctor or dentist to have any type of surgery or procedure, you are allowed (for a period no longer than 30 days), to receive additional pain medication, for the acute post-op pain. However, in this case, you are responsible for picking up a copy of our "Post-op Pain Management for Surgeons" handout, and giving it to your surgeon or dentist. This document is available at our office, and does not require an appointment to obtain it. Simply go to our office during business hours (Monday-Thursday from 8:00 AM to 4:00 PM) (Friday 8:00 AM to 12:00 Noon) or if you have a scheduled appointment with us, prior to your surgery, and ask for it by name. In addition, you will need to provide us with your name, name of your surgeon, type of surgery, and date of procedure or surgery.  *Opioid medications include: morphine, codeine, oxycodone, oxymorphone, hydrocodone, hydromorphone, meperidine, tramadol, tapentadol, buprenorphine, fentanyl, methadone. **Benzodiazepine medications include: diazepam (Valium), alprazolam (Xanax), clonazepam (Klonopine), lorazepam (Ativan), clorazepate (Tranxene), chlordiazepoxide (Librium), estazolam (Prosom), oxazepam (Serax), temazepam (Restoril), triazolam (Halcion)  ____________________________________________________________________________________________   BMI Assessment: Estimated body mass index is 64.43 kg/m as calculated from the following:   Height as of this encounter: 5\' 1"  (1.549 m).   Weight as of this encounter: 341 lb (154.7 kg).  BMI interpretation table: BMI level Category Range association with higher incidence of chronic  pain  <18 kg/m2 Underweight   18.5-24.9 kg/m2 Ideal body weight   25-29.9 kg/m2 Overweight Increased incidence by 20%  30-34.9 kg/m2 Obese (Class I) Increased incidence by 68%  35-39.9 kg/m2 Severe obesity (Class II) Increased incidence by 136%  >40 kg/m2 Extreme obesity (Class III) Increased incidence by 254%   BMI Readings from Last 4 Encounters:  02/04/17 64.43 kg/m  01/17/17 66.70 kg/m  01/14/17 66.83 kg/m  01/13/17 66.77 kg/m   Wt Readings from Last 4 Encounters:  02/04/17 (!) 341 lb (154.7 kg)  01/17/17 (!) 353 lb (160.1 kg)  01/14/17 (!) 353 lb 11.2 oz (160.4 kg)  01/13/17 (!) 353 lb 6 oz (160.3 kg)

## 2017-02-04 NOTE — Progress Notes (Signed)
Patient's Name: Tracey White  MRN: [id number]  Referring Provider: Derinda Late, MD  DOB: Jul 19, 1980  PCP: Zeb Comfort, MD  DOS: 02/04/2017  Note by: Vevelyn Francois NP  Service setting: Ambulatory outpatient  Specialty: Interventional Pain Management  Location: [facility] (AMB) Pain Management Facility    Patient type: Established    Primary Reason(s) for Visit: Encounter for prescription drug management. (Level of risk: moderate)  CC: Knee Pain (bilateral, right worse)  HPI  Tracey White is a 37 y.o. year old, female patient, who comes today for a medication management evaluation. She has Chest pain at rest; Chronic diastolic heart failure ([place]); Tachycardia; Obstructive sleep apnea on CPAP; Tobacco use; Chest pain; Hypokalemia; Depression; Chronic low back pain (Primary Area of Pain) (Bilateral) (L>R); Skin lesion of left leg; Lymphedema; Chronic knee pain (Secondary Area of Pain) (Bilateral) (R>L); Chronic thoracic back pain (Fourth Area of Pain) (Bilateral) (R>L); Chronic pain syndrome; Disorder of skeletal system; Other long term (current) drug therapy; Scoliosis; Asthma without status asthmaticus; Anemia; Acute diastolic heart failure ([place]); Neurogenic pain; Failed back surgical syndrome; Pharmacologic therapy; Problems influencing health status; Chronic lower extremity pain (Tertiary Area of Pain) (Bilateral) (R>L); Elevated C-reactive protein (CRP); Elevated sed rate; Vitamin D deficiency; Class 3 obesity with alveolar hypoventilation, serious comorbidity, and body mass index (BMI) of 60.0 to 69.9 in adult [facility]); ANA positive; and Positive antinuclear antibody on their problem list. Her primarily concern today is the Knee Pain (bilateral, right worse)  Pain Assessment: Location: Right, Left Knee Onset: More than a month ago Duration: Chronic pain Quality: Aching, [place] Severity: 5 /10 (self-reported pain score)  Note: Reported level is compatible with observation.                      Timing: Constant Modifying factors: nothing  Tracey White was last scheduled for an appointment on 12/12/2016 for medication management. During today's appointment we reviewed Tracey White's chronic pain status, as well as her outpatient medication regimen. She states that her pain remains constant. She is concern that she can not have any interventional therapy secondary to her weight. She admits that she is loosing weight. She states that she is down 14 pounds. She wonders if this is enough.   The patient  reports that she does not use drugs. Her body mass index is 64.43 kg/m.  Further details on both, my assessment(s), as well as the proposed treatment plan, please see below.  Controlled Substance Pharmacotherapy Assessment REMS (Risk Evaluation and Mitigation Strategy)  Analgesic: Tramadol 50 mg TID (Tramadol 150 mg per day) MME/day: 15 mg/day.  Dewayne Shorter, RN  02/04/2017  9:13 AM  Signed Nursing Pain Medication Assessment:  Safety precautions to be maintained throughout the outpatient stay will include: orient to surroundings, keep bed in low position, maintain call bell within reach at all times, provide assistance with transfer out of bed and ambulation.  Medication Inspection Compliance: Pill count conducted under aseptic conditions, in front of the patient. Neither the pills nor the bottle was removed from the patient's sight at any time. Once count was completed pills were immediately returned to the patient in their original bottle.  Medication: Tramadol (Ultram) Pill/Patch Count: 16 of 90 pills remain Pill/Patch Appearance: Markings consistent with prescribed medication Bottle Appearance: Standard pharmacy container. Clearly labeled. Filled Date:12/ 13/ 2018 Last Medication intake:  Yesterday   Pharmacokinetics: Liberation and absorption (onset of action): WNL Distribution (time to peak effect): WNL Metabolism and excretion (  duration of action): WNL          Pharmacodynamics: Desired effects: Analgesia: Tracey White reports >50% benefit. Functional ability: Patient reports that medication allows her to accomplish basic ADLs Clinically meaningful improvement in function (CMIF): Sustained CMIF goals met Perceived effectiveness: Described as relatively effective, allowing for increase in activities of daily living (ADL) Undesirable effects: Side-effects or Adverse reactions: None reported Monitoring: [place] PMP: Online review of the past 66-monthperiod conducted. Compliant with practice rules and regulations Last UDS on record: Summary  Date Value Ref Range Status  12/12/2016 FINAL  Final    Comment:    ==================================================================== TOXASSURE COMP DRUG ANALYSIS,UR ==================================================================== Test                             Result       Flag       Units Drug Present and Declared for Prescription Verification   Alprazolam                     29           EXPECTED   ng/mg creat   Alpha-hydroxyalprazolam        47           EXPECTED   ng/mg creat    Source of alprazolam is a scheduled prescription medication.    Alpha-hydroxyalprazolam is an expected metabolite of alprazolam.   Gabapentin                     PRESENT      EXPECTED   Citalopram                     PRESENT      EXPECTED   Desmethylcitalopram            PRESENT      EXPECTED    Desmethylcitalopram is an expected metabolite of citalopram or    the enantiomeric form, escitalopram.   Aripiprazole                   PRESENT      EXPECTED   Diltiazem                      PRESENT      EXPECTED   Metoprolol                     PRESENT      EXPECTED Drug Present not Declared for Prescription Verification   Tramadol                       262          UNEXPECTED ng/mg creat   O-Desmethyltramadol            146          UNEXPECTED ng/mg creat    Source of tramadol is a prescription medication.     O-desmethyltramadol is an expected metabolite of tramadol.   Acetaminophen                  PRESENT      UNEXPECTED   Ibuprofen                      PRESENT      UNEXPECTED   Naproxen  PRESENT      UNEXPECTED Drug Absent but Declared for Prescription Verification   Diclofenac                     Not Detected UNEXPECTED    Diclofenac, as indicated in the declared medication list, is not    always detected even when used as directed.   Lidocaine                      Not Detected UNEXPECTED    Lidocaine, as indicated in the declared medication list, is not    always detected even when used as directed. ==================================================================== Test                      Result    Flag   Units      Ref Range   Creatinine              118              mg/dL      >=20 ==================================================================== Declared Medications:  The flagging and interpretation on this report are based on the  following declared medications.  Unexpected results may arise from  inaccuracies in the declared medications.  **Note: The testing scope of this panel includes these medications:  Alprazolam (Xanax)  Aripiprazole (Abilify)  Diltiazem (Cardizem)  Escitalopram (Lexapro)  Gabapentin (Neurontin)  Metoprolol (Toprol)  **Note: The testing scope of this panel does not include small to  moderate amounts of these reported medications:  Diclofenac (Voltaren)  Lidocaine (Lidoderm)  **Note: The testing scope of this panel does not include following  reported medications:  Albuterol (Proventil)  Beclomethasone (QVAR)  Bumetanide (Bumex)  Losartan (Cozaar)  Montelukast (Singulair)  Potassium (K-Dur)  Potassium (Klor-Con) ==================================================================== For clinical consultation, please call [phone]. ====================================================================    UDS  interpretation: Compliant          Medication Assessment Form: Reviewed. Patient indicates being compliant with therapy Treatment compliance: Compliant Risk Assessment Profile: Aberrant behavior: See prior evaluations. None observed or detected today Comorbid factors increasing risk of overdose: See prior notes. No additional risks detected today Risk of substance use disorder (SUD): Low Opioid Risk Tool - 01/08/17 1021      Family History of Substance Abuse   Alcohol  Positive Female    Illegal Drugs  Negative      Personal History of Substance Abuse   Alcohol  Negative    Illegal Drugs  Negative    Rx Drugs  Negative      Total Score   Opioid Risk Tool Scoring  1    Opioid Risk Interpretation  Low Risk      ORT Scoring interpretation table:  Score <3 = Low Risk for SUD  Score between 4-7 = Moderate Risk for SUD  Score >8 = High Risk for Opioid Abuse   Risk Mitigation Strategies:  Patient Counseling: Covered Patient-Prescriber Agreement (PPA): Present and active  Notification to other healthcare providers: Done  Pharmacologic Plan: No change in therapy, at this time.             Laboratory Chemistry  Inflammation Markers (CRP: Acute Phase) (ESR: Chronic Phase) Lab Results  Component Value Date   CRP 58.7 (H) 12/12/2016   ESRSEDRATE 59 (H) 12/12/2016                 Rheumatology Markers Lab Results  Component Value Date   RF <10.0  01/08/2017   ANA Positive (A) 01/08/2017                Renal Function Markers Lab Results  Component Value Date   BUN 12 01/13/2017   CREATININE 0.76 01/13/2017   GFRAA >60 01/13/2017   GFRNONAA >60 01/13/2017                 Hepatic Function Markers Lab Results  Component Value Date   AST 17 12/30/2016   ALT 13 (L) 12/30/2016   ALBUMIN 3.3 (L) 12/30/2016   ALKPHOS 87 12/30/2016                 Electrolytes Lab Results  Component Value Date   NA 137 01/13/2017   K 3.3 (L) 01/13/2017   CL 96 (L) 01/13/2017   CALCIUM  8.9 01/13/2017   MG 2.1 12/12/2016                 Neuropathy Markers Lab Results  Component Value Date   VITAMINB12 255 12/25/2016   HGBA1C 6.1 (H) 05/31/2016   HIV Non Reactive 12/25/2016                 Bone Pathology Markers Lab Results  Component Value Date   25OHVITD1 9.1 (L) 12/12/2016   25OHVITD2 3.6 12/12/2016   25OHVITD3 5.5 12/12/2016                 Coagulation Parameters Lab Results  Component Value Date   PLT 313 12/30/2016   DDIMER <0.27 12/24/2016                 Cardiovascular Markers Lab Results  Component Value Date   BNP 29.0 12/30/2016   TROPONINI <0.03 12/30/2016   HGB 10.8 (L) 12/30/2016   HCT 34.7 (L) 12/30/2016                 CA Markers No results found for: CEA, CA125, LABCA2               Note: Lab results reviewed.  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  Slight vascular congestion.  Stable mediastinal contours.  Lungs clear.  No pleural effusion or pneumothorax.  Dextroconvex thoracic scoliosis with prior spinal fixation surgery.  IMPRESSION: Enlargement of cardiac silhouette with pulmonary vascular congestion.  No definite acute infiltrate.  Electronically Signed   By: Lavonia Dana M.D.   On: 12/30/2016 19:57  Complexity Note: Imaging results reviewed. Results shared with Tracey White, using Layman's terms.                         Meds   Current Outpatient Medications:  .  albuterol (PROVENTIL HFA;VENTOLIN HFA) 108 (90 Base) MCG/ACT inhaler, Inhale 2-4 puffs by mouth every 4 hours as needed for wheezing, cough, and/or shortness of breath, Disp: 1 Inhaler, Rfl: 1 .  ARIPiprazole (ABILIFY) 2 MG tablet,  Take 4 mg by mouth daily. , Disp: , Rfl:  .  budesonide-formoterol (SYMBICORT) 160-4.5 MCG/ACT inhaler, Inhale 2 puffs into the lungs 2 (two) times daily., Disp: , Rfl:  .  bumetanide (BUMEX) 2 MG tablet, Take 2-4 mg by mouth See admin  instructions. 4 mg in the AM and 2 mg in the PM, Disp: , Rfl:  .  Calcium Carb-Cholecalciferol (CALCIUM PLUS D3 ABSORBABLE) [phone] MG-UNIT CAPS, Take 1 capsule by mouth daily with breakfast., Disp: 90 capsule, Rfl: 0 .  Cholecalciferol 5000 units capsule, Take 1 capsule (5,000 Units total) by mouth daily., Disp: 90 capsule, Rfl: 0 .  diclofenac sodium (VOLTAREN) 1 % GEL, Apply 4 (four) times daily topically., Disp: , Rfl:  .  diltiazem (CARDIZEM) 120 MG tablet, Take 120 mg daily by mouth., Disp: , Rfl:  .  escitalopram (LEXAPRO) 20 MG tablet, Take 20 mg by mouth daily. , Disp: , Rfl:  .  gabapentin (NEURONTIN) 100 MG capsule, Take 100 mg by mouth 3 (three) times daily. , Disp: , Rfl:  .  lidocaine (LIDODERM) 5 %, Place 1 patch daily onto the skin. Remove & Discard patch within 12 hours or as directed by MD, Disp: , Rfl:  .  losartan (COZAAR) 25 MG tablet, Take 25 mg by mouth daily., Disp: , Rfl:  .  meloxicam (MOBIC) 15 MG tablet, Take 15 mg by mouth daily as needed for pain., Disp: , Rfl:  .  metolazone (ZAROXOLYN) 2.5 MG tablet, Take 1 tablet (2.5 mg total) by mouth daily., Disp: 3 tablet, Rfl: 0 .  metoprolol succinate (TOPROL-XL) 25 MG 24 hr tablet, Take 2 tablets daily by mouth. , Disp: , Rfl:  .  montelukast (SINGULAIR) 10 MG tablet, Take 10 mg by mouth at bedtime., Disp: , Rfl:  .  potassium chloride SA (K-DUR,KLOR-CON) 20 MEQ tablet, Take 40 mEq by mouth 2 (two) times daily., Disp: , Rfl:  .  traMADol (ULTRAM) 50 MG tablet, Take 1 tablet (50 mg total) by mouth every 8 (eight) hours as needed for severe pain., Disp: 90 tablet, Rfl: 0 No current facility-administered medications for this visit.   Facility-Administered Medications Ordered in Other Visits:  .   technetium tetrofosmin (TC-MYOVIEW) injection 31.54 millicurie, 00.86 millicurie, Intravenous, Once PRN, [place], David A, MD  ROS  Constitutional: Denies any fever or chills Gastrointestinal: No reported hemesis, hematochezia, vomiting, or acute GI distress Musculoskeletal: Denies any acute onset joint swelling, redness, loss of ROM, or weakness Neurological: No reported episodes of acute onset apraxia, aphasia, dysarthria, agnosia, amnesia, paralysis, loss of coordination, or loss of consciousness  Allergies  Tracey White has No Known Allergies.  [place]  Drug: Tracey White  reports that she does not use drugs. Alcohol:  reports that she drinks about 0.6 - 1.2 oz of alcohol per week. Tobacco:  reports that she has been smoking cigarettes.  She has been smoking about 0.50 packs per day. she has never used smokeless tobacco. Medical:  has a past medical history of Anxiety, CHF (congestive heart failure) ([place]), Chronic heart failure ([place]), Morbid obesity ([place]), OSA (obstructive sleep apnea), Peripheral edema, and Scoliosis. Surgical: Tracey White  has a past surgical history that includes Tubal ligation; Hernia repair; and scoliosis repair. Family: family history includes Diabetes in her mother; Hypertension in her mother.  Constitutional Exam  General appearance: alert, cooperative and morbidly obese Vitals:   02/04/17 0907  BP: 115/79  Pulse: (!) 108  Resp: 18  Temp: 98 F (36.7 C)  SpO2: 94%  Weight: (!) 341 lb (154.7 kg)  Height: _0  (1.549 m)   BMI Assessment: Estimated body mass index is 64.43 kg/m as calculated from the following:   Height as of this encounter: _1  (1.549 m).   Weight as of  this encounter: 341 lb (154.7 kg).  BMI interpretation table: BMI level Category Range association with higher incidence of chronic pain  <18 kg/m2 Underweight   18.5-24.9 kg/m2 Ideal body weight   25-29.9 kg/m2 Overweight Increased incidence by 20%  30-34.9 kg/m2 Obese (Class I) Increased  incidence by 68%  35-39.9 kg/m2 Severe obesity (Class II) Increased incidence by 136%  >40 kg/m2 Extreme obesity (Class III) Increased incidence by 254%   BMI Readings from Last 4 Encounters:  02/04/17 64.43 kg/m  01/17/17 66.70 kg/m  01/14/17 66.83 kg/m  01/13/17 66.77 kg/m   Wt Readings from Last 4 Encounters:  02/04/17 (!) 341 lb (154.7 kg)  01/17/17 (!) 353 lb (160.1 kg)  01/14/17 (!) 353 lb 11.2 oz (160.4 kg)  01/13/17 (!) 353 lb 6 oz (160.3 kg)  Psych/Mental status: Alert, oriented x 3 (person, place, & time)       Eyes: PERLA Respiratory: No evidence of acute respiratory distress  Thoracic Spine Area Exam  Skin & Axial Inspection: No masses, redness, or swelling Alignment: Asymmetric Functional ROM: Unrestricted ROM Stability: No instability detected Muscle Tone/Strength: Functionally intact. No obvious neuro-muscular anomalies detected. Sensory (Neurological): Unimpaired Muscle strength & Tone: Complains of area being tender to palpation  Lumbar Spine Area Exam  Skin & Axial Inspection: No masses, redness, or swelling Alignment: Scoliosis detected Functional ROM: Unrestricted ROM      Stability: No instability detected Muscle Tone/Strength: Functionally intact. No obvious neuro-muscular anomalies detected. Sensory (Neurological): Unimpaired Palpation: Complains of area being tender to palpation       Provocative Tests: Lumbar Hyperextension and rotation test: evaluation deferred today       Lumbar Lateral bending test: evaluation deferred today       Patrick's Maneuver: evaluation deferred today                    Gait & Posture Assessment  Ambulation: Unassisted Gait: Relatively normal for age and body habitus Posture: WNL   Lower Extremity Exam    Side: Right lower extremity  Side: Left lower extremity  Skin & Extremity Inspection: Skin color, temperature, and hair growth are WNL. No peripheral edema or cyanosis. No masses, redness, swelling, asymmetry,  or associated skin lesions. No contractures.  Skin & Extremity Inspection: Skin color, temperature, and hair growth are WNL. No peripheral edema or cyanosis. No masses, redness, swelling, asymmetry, or associated skin lesions. No contractures.  Functional ROM: Unrestricted ROM          Functional ROM: Unrestricted ROM          Muscle Tone/Strength: Functionally intact. No obvious neuro-muscular anomalies detected.  Muscle Tone/Strength: Functionally intact. No obvious neuro-muscular anomalies detected.  Sensory (Neurological): Unimpaired  Sensory (Neurological): Unimpaired  Palpation: No palpable anomalies  Palpation: No palpable anomalies   Assessment  Primary Diagnosis & Pertinent Problem List: The primary encounter diagnosis was Chronic thoracic back pain (Fourth Area of Pain) (Bilateral) (R>L). Diagnoses of Chronic low back pain (Primary Area of Pain) (Bilateral) (L>R), Chronic knee pain (Secondary Area of Pain) (Bilateral) (R>L), Chronic pain syndrome, Vitamin D deficiency, Chronic lower extremity pain (Tertiary Area of Pain) (Bilateral) (R>L), and Positive antinuclear antibody were also pertinent to this visit.  Status Diagnosis  Controlled Controlled Persistent 1. Chronic thoracic back pain (Fourth Area of Pain) (Bilateral) (R>L)   2. Chronic low back pain (Primary Area of Pain) (Bilateral) (L>R)   3. Chronic knee pain (Secondary Area of Pain) (Bilateral) (R>L)   4. Chronic pain syndrome  5. Vitamin D deficiency   6. Chronic lower extremity pain (Tertiary Area of Pain) (Bilateral) (R>L)   7. Positive antinuclear antibody     Problems updated and reviewed during this visit: Problem  Ana Positive  Positive Antinuclear Antibody   Plan of Care  Pharmacotherapy (Medications Ordered): Meds ordered this encounter  Medications  . traMADol (ULTRAM) 50 MG tablet    Sig: Take 1 tablet (50 mg total) by mouth every 8 (eight) hours as needed for severe pain.    Dispense:  90 tablet     Refill:  0    Do not place this medication, or any other prescription from our practice, on "Automatic Refill". Patient may have prescription filled one day early if pharmacy is closed on scheduled refill date. Do not fill until: 01/08/17 To last until: 02/07/17    Order Specific Question:   Supervising Provider    Answer:   Milinda Pointer [phone]  . Calcium Carb-Cholecalciferol (CALCIUM PLUS D3 ABSORBABLE) [phone] MG-UNIT CAPS    Sig: Take 1 capsule by mouth daily with breakfast.    Dispense:  90 capsule    Refill:  0    Do not place medication on "Automatic Refill". Fill one day early if pharmacy is closed on scheduled refill date.    Order Specific Question:   Supervising Provider    Answer:   Milinda Pointer [phone]  . Cholecalciferol 5000 units capsule    Sig: Take 1 capsule (5,000 Units total) by mouth daily.    Dispense:  90 capsule    Refill:  0    Do not place medication on "Automatic Refill". Fill one day early if pharmacy is closed on scheduled refill date.    Order Specific Question:   Supervising Provider    Answer:   Milinda Pointer [[id number]]  This SmartLink is deprecated. Use AVSMEDLIST instead to display the medication list for a patient. Medications administered today: Tracey White had no medications administered during this visit. Lab-work, procedure(s), and/or referral(s): Orders Placed This Encounter  Procedures  . Ambulatory referral to Rheumatology   Imaging and/or referral(s): AMB REFERRAL TO RHEUMATOLOGY  Interventional therapies: Planned, scheduled, and/or pending:   Not at this time.   Considering:   Diagnostic bilateral LESI  Diagnostic bilateral lumbar facet nerve block  Possible bilateral lumbar facet RFA  Bilateral Hyalgan series  Diagnostic bilateral intra-articular knee injection  Diagnosticbilateral genicular nerve block  Possible bilateral genicular RFA    PRN Procedures:   None at this time      Provider-requested  follow-up: Return in about 4 weeks (around 03/04/2017) for MedMgmt with Me Donella Stade Edison Pace).  Future Appointments  Date Time Provider [place]  02/18/2017  9:00 AM Georgina Peer, RD [facility] None  03/04/2017  9:00 AM Vevelyn Francois, NP [facility] None  06/12/2017  8:20 AM Alisa Graff, FNP [facility] None   Primary Care Physician: Zeb Comfort, MD Location: [facility] Outpatient Pain Management Facility Note by: Vevelyn Francois NP Date: 02/04/2017; Time: 2:37 PM  Pain Score Disclaimer: We use the NRS-11 scale. This is a self-reported, subjective measurement of pain severity with only modest accuracy. It is used primarily to identify changes within a particular patient. It must be understood that outpatient pain scales are significantly less accurate that those used for research, where they can be applied under ideal controlled circumstances with minimal exposure to variables. In reality, the score is likely to be a combination of pain intensity and pain affect, where  pain affect describes the degree of emotional arousal or changes in action readiness caused by the sensory experience of pain. Factors such as social and work situation, setting, emotional state, anxiety levels, expectation, and prior pain experience may influence pain perception and show large inter-individual differences that may also be affected by time variables.  Patient instructions provided during this appointment: Patient Instructions    ____________________________________________________________________________________________  Medication Rules  Applies to: All patients receiving prescriptions (written or electronic).  Pharmacy of record: Pharmacy where electronic prescriptions will be sent. If written prescriptions are taken to a different pharmacy, please inform the nursing staff. The pharmacy listed in the electronic medical record should be the one where you would like electronic prescriptions to be  sent.  Prescription refills: Only during scheduled appointments. Applies to both, written and electronic prescriptions.  NOTE: The following applies primarily to controlled substances (Opioid* Pain Medications).   Patient's responsibilities: 1. Pain Pills: Bring all pain pills to every appointment (except for procedure appointments). 2. Pill Bottles: Bring pills in original pharmacy bottle. Always bring newest bottle. Bring bottle, even if empty. 3. Medication refills: You are responsible for knowing and keeping track of what medications you need refilled. The day before your appointment, write a list of all prescriptions that need to be refilled. Bring that list to your appointment and give it to the admitting nurse. Prescriptions will be written only during appointments. If you forget a medication, it will not be "Called in", "Faxed", or "electronically sent". You will need to get another appointment to get these prescribed. 4. Prescription Accuracy: You are responsible for carefully inspecting your prescriptions before leaving our office. Have the discharge nurse carefully go over each prescription with you, before taking them home. Make sure that your name is accurately spelled, that your address is correct. Check the name and dose of your medication to make sure it is accurate. Check the number of pills, and the written instructions to make sure they are clear and accurate. Make sure that you are given enough medication to last until your next medication refill appointment. 5. Taking Medication: Take medication as prescribed. Never take more pills than instructed. Never take medication more frequently than prescribed. Taking less pills or less frequently is permitted and encouraged, when it comes to controlled substances (written prescriptions).  6. Inform other Doctors: Always inform, all of your healthcare providers, of all the medications you take. 7. Pain Medication from other Providers: You  are not allowed to accept any additional pain medication from any other Doctor or Healthcare provider. There are two exceptions to this rule. (see below) In the event that you require additional pain medication, you are responsible for notifying us, as stated below. 8. Medication Agreement: You are responsible for carefully reading and following our Medication Agreement. This must be signed before receiving any prescriptions from our practice. Safely store a copy of your signed Agreement. Violations to the Agreement will result in no further prescriptions. (Additional copies of our Medication Agreement are available upon request.) 9. Laws, Rules, & Regulations: All patients are expected to follow all Federal and [employer], [employer], Rules, [employer] [facility]. Ignorance of the Laws does not constitute a valid excuse. The use of any illegal substances is prohibited. 10. Adopted CDC guidelines & recommendations: Target dosing levels will be at or below 60 MME/day. Use of benzodiazepines** is not recommended.  Exceptions: There are only two exceptions to the rule of not receiving pain medications from other Healthcare Providers. 1. Exception #1 (  Emergencies): In the event of an emergency (i.e.: accident requiring emergency care), you are allowed to receive additional pain medication. However, you are responsible for: As soon as you are able, call our office [phone], at any time of the day or night, and leave a message stating your name, the date and nature of the emergency, and the name and dose of the medication prescribed. In the event that your call is answered by a member of our staff, make sure to document and save the date, time, and the name of the person that took your information.  2. Exception #2 (Planned Surgery): In the event that you are scheduled by another doctor or dentist to have any type of surgery or procedure, you are allowed (for a period no longer than 30 days), to receive additional pain  medication, for the acute post-op pain. However, in this case, you are responsible for picking up a copy of our "Post-op Pain Management for Surgeons" handout, and giving it to your surgeon or dentist. This document is available at our office, and does not require an appointment to obtain it. Simply go to our office during business hours (Monday-Thursday from 8:00 AM to 4:00 PM) (Friday 8:00 AM to 12:00 Noon) or if you have a scheduled appointment with [place], prior to your surgery, and ask for it by name. In addition, you will need to provide [place] with your name, name of your surgeon, type of surgery, and date of procedure or surgery.  *Opioid medications include: morphine, codeine, oxycodone, oxymorphone, hydrocodone, hydromorphone, meperidine, tramadol, tapentadol, buprenorphine, fentanyl, methadone. **Benzodiazepine medications include: diazepam (Valium), alprazolam (Xanax), clonazepam (Klonopine), lorazepam (Ativan), clorazepate (Tranxene), chlordiazepoxide (Librium), estazolam (Prosom), oxazepam (Serax), temazepam (Restoril), triazolam (Halcion)  ____________________________________________________________________________________________   BMI Assessment: Estimated body mass index is 64.43 kg/m as calculated from the following:   Height as of this encounter: _0  (1.549 m).   Weight as of this encounter: 341 lb (154.7 kg).  BMI interpretation table: BMI level Category Range association with higher incidence of chronic pain  <18 kg/m2 Underweight   18.5-24.9 kg/m2 Ideal body weight   25-29.9 kg/m2 Overweight Increased incidence by 20%  30-34.9 kg/m2 Obese (Class I) Increased incidence by 68%  35-39.9 kg/m2 Severe obesity (Class II) Increased incidence by 136%  >40 kg/m2 Extreme obesity (Class III) Increased incidence by 254%   BMI Readings from Last 4 Encounters:  02/04/17 64.43 kg/m  01/17/17 66.70 kg/m  01/14/17 66.83 kg/m  01/13/17 66.77 kg/m   Wt Readings from Last 4 Encounters:   02/04/17 (!) 341 lb (154.7 kg)  01/17/17 (!) 353 lb (160.1 kg)  01/14/17 (!) 353 lb 11.2 oz (160.4 kg)  01/13/17 (!) 353 lb 6 oz (160.3 kg)

## 2017-02-04 NOTE — Progress Notes (Signed)
Nursing Pain Medication Assessment:  Safety precautions to be maintained throughout the outpatient stay will include: orient to surroundings, keep bed in low position, maintain call bell within reach at all times, provide assistance with transfer out of bed and ambulation.  Medication Inspection Compliance: Pill count conducted under aseptic conditions, in front of the patient. Neither the pills nor the bottle was removed from the patient's sight at any time. Once count was completed pills were immediately returned to the patient in their original bottle.  Medication: Tramadol (Ultram) Pill/Patch Count: 16 of 90 pills remain Pill/Patch Appearance: Markings consistent with prescribed medication Bottle Appearance: Standard pharmacy container. Clearly labeled. Filled Date:12/ 13/ 2018 Last Medication intake:  Tracey EstelleYesterday

## 2017-02-07 ENCOUNTER — Other Ambulatory Visit: Payer: Self-pay | Admitting: Family Medicine

## 2017-02-07 DIAGNOSIS — R1031 Right lower quadrant pain: Secondary | ICD-10-CM

## 2017-02-11 ENCOUNTER — Ambulatory Visit: Admission: RE | Admit: 2017-02-11 | Payer: Medicaid Other | Source: Ambulatory Visit

## 2017-02-18 ENCOUNTER — Encounter: Payer: Self-pay | Admitting: Dietician

## 2017-02-18 ENCOUNTER — Other Ambulatory Visit: Payer: Self-pay | Admitting: Family Medicine

## 2017-02-18 ENCOUNTER — Encounter: Payer: Medicaid Other | Attending: Pain Medicine | Admitting: Dietician

## 2017-02-18 ENCOUNTER — Ambulatory Visit
Admission: RE | Admit: 2017-02-18 | Discharge: 2017-02-18 | Disposition: A | Discharge status: Home / Self Care | Payer: Medicaid Other | Source: Ambulatory Visit | Attending: Family Medicine | Admitting: Family Medicine

## 2017-02-18 VITALS — Ht 61.0 in | Wt 349.9 lb

## 2017-02-18 DIAGNOSIS — R188 Other ascites: Secondary | ICD-10-CM

## 2017-02-18 DIAGNOSIS — R1031 Right lower quadrant pain: Secondary | ICD-10-CM

## 2017-02-18 DIAGNOSIS — Z713 Dietary counseling and surveillance: Secondary | ICD-10-CM | POA: Insufficient documentation

## 2017-02-18 DIAGNOSIS — R0602 Shortness of breath: Secondary | ICD-10-CM | POA: Insufficient documentation

## 2017-02-18 DIAGNOSIS — G894 Chronic pain syndrome: Secondary | ICD-10-CM | POA: Insufficient documentation

## 2017-02-18 DIAGNOSIS — E662 Morbid (severe) obesity with alveolar hypoventilation: Secondary | ICD-10-CM | POA: Diagnosis not present

## 2017-02-18 DIAGNOSIS — Z6841 Body Mass Index (BMI) 40.0 and over, adult: Secondary | ICD-10-CM | POA: Insufficient documentation

## 2017-02-18 NOTE — Patient Instructions (Signed)
   Keep making healthy food choices, great job!  Continue to gradually increase physical exercise, and [facility]  Concentrate on eating small, light meals and snacks throughout the day, every 3-4 hours.

## 2017-02-18 NOTE — Progress Notes (Signed)
Medical Nutrition Therapy: Visit start time: 0900  end time: 0930  Assessment:  Diagnosis: chronic pain, obesity Medical history changes: no changes per patient Psychosocial issues/ stress concerns: undergoing therapy for depression, mostly due to health issues  Current weight: 349.9lbs  Height: 5'1" Medications, supplement changes: no changes per patient Progress and evaluation: Weight loss of 3.8lbs since 01/14/17. Patient reports having virtually stopped drinking sodas, and has increased some physical activity as she is able. She has also continued to decrease cigarette smoking, now 1 pack lasts 4-5 days. Poor appetite continues, she has ultrasound follow-up appt today to hopefully determine some cause of symptoms.    Physical activity: increased general walking, reports feeling more stamina  Dietary Intake:  Usual eating pattern includes 2 meals and 2 snacks per day. Dining out frequency: not assessed today.  Breakfast: none, not usually hungry Snack: cereal or granola bar Lunch: usually salad with lettuce cucumbers, cheese, eggs, tomato Snack: ritz crackers, water Supper: meat, vegetables, starch. No desserts. Drinks juice.  Snack: none Beverages: propel water, juice with dinner meal  Nutrition Care Education: Topics covered: weight control Basic nutrition: appropriate meal and snack schedule, general nutrition guidelines    Weight control: determining reasonable weight loss rate, reviewed factors influencing metabolism, potential weight loss plateaus and how to deal with them-- including ongoing effort to gradually increase exercise; strategies for managing possible increase in appetite while giving up smoking, including drinking fluids before and during meals, keeping portions of vegetables large and portions of meats and starches small.  Anti-inflammatory diet: Encouraged continued effort to consume plenty of vegetables and fruits.  Other: discussed snack and light meal options  for poor appetite and encouraged eating small amounts at regular intervals.   Nutritional Diagnosis:  Scalp Level-3.3 Overweight/obesity As related to excess calories, inactivity.  As evidenced by BMI 66, patient report. [id number] Predicted suboptimal nutrient intake As related to poor appetite, some skipped meals, and financial limitations.  As evidenced by patient report.  Intervention: Discussion as noted above.   Commended patient for positive lifestyle changes, despite ongoing obstacles.    Updated goals with input from patient.   Education Materials given:  Marland Kitchen. Sample menus . Snacking handout . Goals/ instructions . [employer] Regulatory affairs officerit coupon  Learner/ who was taught:  . Patient   Level of understanding: Marland Kitchen. Verbalizes/ demonstrates competency   Demonstrated degree of understanding via:   Teach back Learning barriers: . None  Willingness to learn/ readiness for change: . Eager, change in progress  Monitoring and Evaluation:  Dietary intake, appetite, exercise, pain, and body weight      follow up: 04/10/17

## 2017-03-04 ENCOUNTER — Ambulatory Visit: Payer: Medicaid Other | Admitting: Nurse Practitioner

## 2017-03-04 ENCOUNTER — Other Ambulatory Visit: Payer: Self-pay | Admitting: Family Medicine

## 2017-03-04 DIAGNOSIS — R1084 Generalized abdominal pain: Secondary | ICD-10-CM

## 2017-03-05 ENCOUNTER — Encounter: Payer: Self-pay | Admitting: Nurse Practitioner

## 2017-03-05 ENCOUNTER — Other Ambulatory Visit: Payer: Self-pay

## 2017-03-05 ENCOUNTER — Ambulatory Visit: Payer: Medicaid Other | Attending: Nurse Practitioner | Admitting: Nurse Practitioner

## 2017-03-05 VITALS — BP 132/84 | HR 99 | Temp 98.1°F | Resp 18 | Ht 61.0 in | Wt 348.0 lb

## 2017-03-05 DIAGNOSIS — M5441 Lumbago with sciatica, right side: Secondary | ICD-10-CM

## 2017-03-05 DIAGNOSIS — F17210 Nicotine dependence, cigarettes, uncomplicated: Secondary | ICD-10-CM | POA: Diagnosis not present

## 2017-03-05 DIAGNOSIS — M25562 Pain in left knee: Secondary | ICD-10-CM | POA: Diagnosis present

## 2017-03-05 DIAGNOSIS — M546 Pain in thoracic spine: Secondary | ICD-10-CM | POA: Diagnosis not present

## 2017-03-05 DIAGNOSIS — Z6841 Body Mass Index (BMI) 40.0 and over, adult: Secondary | ICD-10-CM | POA: Diagnosis not present

## 2017-03-05 DIAGNOSIS — M4184 Other forms of scoliosis, thoracic region: Secondary | ICD-10-CM | POA: Insufficient documentation

## 2017-03-05 DIAGNOSIS — E876 Hypokalemia: Secondary | ICD-10-CM | POA: Insufficient documentation

## 2017-03-05 DIAGNOSIS — I5032 Chronic diastolic (congestive) heart failure: Secondary | ICD-10-CM | POA: Insufficient documentation

## 2017-03-05 DIAGNOSIS — F329 Major depressive disorder, single episode, unspecified: Secondary | ICD-10-CM | POA: Insufficient documentation

## 2017-03-05 DIAGNOSIS — G894 Chronic pain syndrome: Secondary | ICD-10-CM

## 2017-03-05 DIAGNOSIS — M5442 Lumbago with sciatica, left side: Secondary | ICD-10-CM | POA: Diagnosis not present

## 2017-03-05 DIAGNOSIS — M545 Low back pain: Secondary | ICD-10-CM | POA: Diagnosis not present

## 2017-03-05 DIAGNOSIS — M25561 Pain in right knee: Secondary | ICD-10-CM | POA: Insufficient documentation

## 2017-03-05 DIAGNOSIS — Z79891 Long term (current) use of opiate analgesic: Secondary | ICD-10-CM | POA: Diagnosis not present

## 2017-03-05 DIAGNOSIS — G4733 Obstructive sleep apnea (adult) (pediatric): Secondary | ICD-10-CM | POA: Diagnosis not present

## 2017-03-05 DIAGNOSIS — Z951 Presence of aortocoronary bypass graft: Secondary | ICD-10-CM | POA: Diagnosis not present

## 2017-03-05 DIAGNOSIS — R079 Chest pain, unspecified: Secondary | ICD-10-CM | POA: Insufficient documentation

## 2017-03-05 DIAGNOSIS — G8929 Other chronic pain: Secondary | ICD-10-CM | POA: Diagnosis not present

## 2017-03-05 DIAGNOSIS — J45909 Unspecified asthma, uncomplicated: Secondary | ICD-10-CM | POA: Diagnosis not present

## 2017-03-05 DIAGNOSIS — Z79899 Other long term (current) drug therapy: Secondary | ICD-10-CM | POA: Insufficient documentation

## 2017-03-05 DIAGNOSIS — E559 Vitamin D deficiency, unspecified: Secondary | ICD-10-CM | POA: Diagnosis not present

## 2017-03-05 MED ORDER — TRAMADOL HCL 50 MG PO TABS: 50.0000 mg | ORAL_TABLET | Freq: Three times a day (TID) | ORAL | 2 refills | Status: DC | PRN

## 2017-03-05 NOTE — Progress Notes (Signed)
Nursing Pain Medication Assessment:  Safety precautions to be maintained throughout the outpatient stay will include: orient to surroundings, keep bed in low position, maintain call bell within reach at all times, provide assistance with transfer out of bed and ambulation.  Medication Inspection Compliance: Pill count conducted under aseptic conditions, in front of the patient. Neither the pills nor the bottle was removed from the patient's sight at any time. Once count was completed pills were immediately returned to the patient in their original bottle.  Medication: Tramadol (Ultram) Pill/Patch Count: 5 of 90 pills remain Pill/Patch Appearance: Markings consistent with prescribed medication Bottle Appearance: Standard pharmacy container. Clearly labeled. Filled Date: 1 / 9 / 2019 Last Medication intake:  Yesterday

## 2017-03-05 NOTE — Patient Instructions (Addendum)
____________________________________________________________________________________________  Medication Rules  Applies to: All patients receiving prescriptions (written or electronic).  Pharmacy of record: Pharmacy where electronic prescriptions will be sent. If written prescriptions are taken to a different pharmacy, please inform the nursing staff. The pharmacy listed in the electronic medical record should be the one where you would like electronic prescriptions to be sent.  Prescription refills: Only during scheduled appointments. Applies to both, written and electronic prescriptions.  NOTE: The following applies primarily to controlled substances (Opioid* Pain Medications).   Patient's responsibilities: 1. Pain Pills: Bring all pain pills to every appointment (except for procedure appointments). 2. Pill Bottles: Bring pills in original pharmacy bottle. Always bring newest bottle. Bring bottle, even if empty. 3. Medication refills: You are responsible for knowing and keeping track of what medications you need refilled. The day before your appointment, write a list of all prescriptions that need to be refilled. Bring that list to your appointment and give it to the admitting nurse. Prescriptions will be written only during appointments. If you forget a medication, it will not be "Called in", "Faxed", or "electronically sent". You will need to get another appointment to get these prescribed. 4. Prescription Accuracy: You are responsible for carefully inspecting your prescriptions before leaving our office. Have the discharge nurse carefully go over each prescription with you, before taking them home. Make sure that your name is accurately spelled, that your address is correct. Check the name and dose of your medication to make sure it is accurate. Check the number of pills, and the written instructions to make sure they are clear and accurate. Make sure that you are given enough medication to  last until your next medication refill appointment. 5. Taking Medication: Take medication as prescribed. Never take more pills than instructed. Never take medication more frequently than prescribed. Taking less pills or less frequently is permitted and encouraged, when it comes to controlled substances (written prescriptions).  6. Inform other Doctors: Always inform, all of your healthcare providers, of all the medications you take. 7. Pain Medication from other Providers: You are not allowed to accept any additional pain medication from any other Doctor or Healthcare provider. There are two exceptions to this rule. (see below) In the event that you require additional pain medication, you are responsible for notifying us, as stated below. 8. Medication Agreement: You are responsible for carefully reading and following our Medication Agreement. This must be signed before receiving any prescriptions from our practice. Safely store a copy of your signed Agreement. Violations to the Agreement will result in no further prescriptions. (Additional copies of our Medication Agreement are available upon request.) 9. Laws, Rules, & Regulations: All patients are expected to follow all Federal and State Laws, Statutes, Rules, & Regulations. Ignorance of the Laws does not constitute a valid excuse. The use of any illegal substances is prohibited. 10. Adopted CDC guidelines & recommendations: Target dosing levels will be at or below 60 MME/day. Use of benzodiazepines** is not recommended.  Exceptions: There are only two exceptions to the rule of not receiving pain medications from other Healthcare Providers. 1. Exception #1 (Emergencies): In the event of an emergency (i.e.: accident requiring emergency care), you are allowed to receive additional pain medication. However, you are responsible for: As soon as you are able, call our office [phone], at any time of the day or night, and leave a message stating your  name, the date and nature of the emergency, and the name and dose of the medication   prescribed. In the event that your call is answered by a member of our staff, make sure to document and save the date, time, and the name of the person that took your information.  2. Exception #2 (Planned Surgery): In the event that you are scheduled by another doctor or dentist to have any type of surgery or procedure, you are allowed (for a period no longer than 30 days), to receive additional pain medication, for the acute post-op pain. However, in this case, you are responsible for picking up a copy of our "Post-op Pain Management for Surgeons" handout, and giving it to your surgeon or dentist. This document is available at our office, and does not require an appointment to obtain it. Simply go to our office during business hours (Monday-Thursday from 8:00 AM to 4:00 PM) (Friday 8:00 AM to 12:00 Noon) or if you have a scheduled appointment with us, prior to your surgery, and ask for it by name. In addition, you will need to provide us with your name, name of your surgeon, type of surgery, and date of procedure or surgery.  *Opioid medications include: morphine, codeine, oxycodone, oxymorphone, hydrocodone, hydromorphone, meperidine, tramadol, tapentadol, buprenorphine, fentanyl, methadone. **Benzodiazepine medications include: diazepam (Valium), alprazolam (Xanax), clonazepam (Klonopine), lorazepam (Ativan), clorazepate (Tranxene), chlordiazepoxide (Librium), estazolam (Prosom), oxazepam (Serax), temazepam (Restoril), triazolam (Halcion)  ____________________________________________________________________________________________  ____________________________________________________________________________________________  Pain Scale  Introduction: The pain score used by this practice is the Verbal Numerical Rating Scale (VNRS-11). This is an 11-point scale. It is for adults and children 10 years or older. There are  significant differences in how the pain score is reported, used, and applied. Forget everything you learned in the past and learn this scoring system.  General Information: The scale should reflect your current level of pain. Unless you are specifically asked for the level of your worst pain, or your average pain. If you are asked for one of these two, then it should be understood that it is over the past 24 hours.  Basic Activities of Daily Living (ADL): Personal hygiene, dressing, eating, transferring, and using restroom.  Instructions: Most patients tend to report their level of pain as a combination of two factors, their physical pain and their psychosocial pain. This last one is also known as "suffering" and it is reflection of how physical pain affects you socially and psychologically. From now on, report them separately. From this point on, when asked to report your pain level, report only your physical pain. Use the following table for reference.  Pain Clinic Pain Levels (0-5/10)  Pain Level Score  Description  No Pain 0   Mild pain 1 Nagging, annoying, but does not interfere with basic activities of daily living (ADL). Patients are able to eat, bathe, get dressed, toileting (being able to get on and off the toilet and perform personal hygiene functions), transfer (move in and out of bed or a chair without assistance), and maintain continence (able to control bladder and bowel functions). Blood pressure and heart rate are unaffected. A normal heart rate for a healthy adult ranges from 60 to 100 bpm (beats per minute).   Mild to moderate pain 2 Noticeable and distracting. Impossible to hide from other people. More frequent flare-ups. Still possible to adapt and function close to normal. It can be very annoying and may have occasional stronger flare-ups. With discipline, patients may get used to it and adapt.   Moderate pain 3 Interferes significantly with activities of daily living (ADL). It  becomes difficult   to feed, bathe, get dressed, get on and off the toilet or to perform personal hygiene functions. Difficult to get in and out of bed or a chair without assistance. Very distracting. With effort, it can be ignored when deeply involved in activities.   Moderately severe pain 4 Impossible to ignore for more than a few minutes. With effort, patients may still be able to manage work or participate in some social activities. Very difficult to concentrate. Signs of autonomic nervous system discharge are evident: dilated pupils (mydriasis); mild sweating (diaphoresis); sleep interference. Heart rate becomes elevated (>115 bpm). Diastolic blood pressure (lower number) rises above 100 mmHg. Patients find relief in laying down and not moving.   Severe pain 5 Intense and extremely unpleasant. Associated with frowning face and frequent crying. Pain overwhelms the senses.  Ability to do any activity or maintain social relationships becomes significantly limited. Conversation becomes difficult. Pacing back and forth is common, as getting into a comfortable position is nearly impossible. Pain wakes you up from deep sleep. Physical signs will be obvious: pupillary dilation; increased sweating; goosebumps; brisk reflexes; cold, clammy hands and feet; nausea, vomiting or dry heaves; loss of appetite; significant sleep disturbance with inability to fall asleep or to remain asleep. When persistent, significant weight loss is observed due to the complete loss of appetite and sleep deprivation.  Blood pressure and heart rate becomes significantly elevated. Caution: If elevated blood pressure triggers a pounding headache associated with blurred vision, then the patient should immediately seek attention at an urgent or emergency care unit, as these may be signs of an impending stroke.    Emergency Department Pain Levels (6-10/10)  Emergency Room Pain 6 Severely limiting. Requires emergency care and should not be  seen or managed at an outpatient pain management facility. Communication becomes difficult and requires great effort. Assistance to reach the emergency department may be required. Facial flushing and profuse sweating along with potentially dangerous increases in heart rate and blood pressure will be evident.   Distressing pain 7 Self-care is very difficult. Assistance is required to transport, or use restroom. Assistance to reach the emergency department will be required. Tasks requiring coordination, such as bathing and getting dressed become very difficult.   Disabling pain 8 Self-care is no longer possible. At this level, pain is disabling. The individual is unable to do even the most "basic" activities such as walking, eating, bathing, dressing, transferring to a bed, or toileting. Fine motor skills are lost. It is difficult to think clearly.   Incapacitating pain 9 Pain becomes incapacitating. Thought processing is no longer possible. Difficult to remember your own name. Control of movement and coordination are lost.   The worst pain imaginable 10 At this level, most patients pass out from pain. When this level is reached, collapse of the autonomic nervous system occurs, leading to a sudden drop in blood pressure and heart rate. This in turn results in a temporary and dramatic drop in blood flow to the brain, leading to a loss of consciousness. Fainting is one of the body's self defense mechanisms. Passing out puts the brain in a calmed state and causes it to shut down for a while, in order to begin the healing process.    Summary: 1. Refer to this scale when providing us with your pain level. 2. Be accurate and careful when reporting your pain level. This will help with your care. 3. Over-reporting your pain level will lead to loss of credibility. 4. Even a level of   1/10 means that there is pain and will be treated at our facility. 5. High, inaccurate reporting will be documented as "Symptom  Exaggeration", leading to loss of credibility and suspicions of possible secondary gains such as obtaining more narcotics, or wanting to appear disabled, for fraudulent reasons. 6. Only pain levels of 5 or below will be seen at our facility. 7. Pain levels of 6 and above will be sent to the Emergency Department and the appointment cancelled. ____________________________________________________________________________________________   BMI Assessment: Estimated body mass index is 65.75 kg/m as calculated from the following:   Height as of this encounter: 5\' 1"  (1.549 m).   Weight as of this encounter: 348 lb (157.9 kg).  BMI interpretation table: BMI level Category Range association with higher incidence of chronic pain  <18 kg/m2 Underweight   18.5-24.9 kg/m2 Ideal body weight   25-29.9 kg/m2 Overweight Increased incidence by 20%  30-34.9 kg/m2 Obese (Class I) Increased incidence by 68%  35-39.9 kg/m2 Severe obesity (Class II) Increased incidence by 136%  >40 kg/m2 Extreme obesity (Class III) Increased incidence by 254%   BMI Readings from Last 4 Encounters:  03/05/17 65.75 kg/m  02/18/17 66.11 kg/m  02/04/17 64.43 kg/m  01/17/17 66.70 kg/m   Wt Readings from Last 4 Encounters:  03/05/17 (!) 348 lb (157.9 kg)  02/18/17 (!) 349 lb 14.4 oz (158.7 kg)  02/04/17 (!) 341 lb (154.7 kg)  01/17/17 (!) 353 lb (160.1 kg)

## 2017-03-05 NOTE — Progress Notes (Signed)
Patient's Name: Tracey White  MRN: [id number]  Referring Provider: Zeb Comfort, MD  DOB: September 06, 1980  PCP: Zeb Comfort, MD  DOS: 03/05/2017  Note by: Vevelyn Francois NP  Service setting: Ambulatory outpatient  Specialty: Interventional Pain Management  Location: [facility] (AMB) Pain Management Facility    Patient type: Established    Primary Reason(s) for Visit: Encounter for prescription drug management. (Level of risk: moderate)  CC: Knee Pain (bilaterally) and Back Pain (lower)  HPI  Tracey White is a 37 y.o. year old, female patient, who comes today for a medication management evaluation. Tracey White has Chest pain at rest; Chronic diastolic heart failure ([place]); Tachycardia; Obstructive sleep apnea on CPAP; Tobacco use; Chest pain; Hypokalemia; Depression; Chronic low back pain (Primary Area of Pain) (Bilateral) (L>R); Skin lesion of left leg; Lymphedema; Chronic knee pain (Secondary Area of Pain) (Bilateral) (R>L); Chronic thoracic back pain (Fourth Area of Pain) (Bilateral) (R>L); Chronic pain syndrome; Disorder of skeletal system; Other long term (current) drug therapy; Scoliosis; Asthma without status asthmaticus; Anemia; Acute diastolic heart failure ([place]); Neurogenic pain; Failed back surgical syndrome; Pharmacologic therapy; Problems influencing health status; Chronic lower extremity pain (Tertiary Area of Pain) (Bilateral) (R>L); Elevated C-reactive protein (CRP); Elevated sed rate; Vitamin D deficiency; Class 3 obesity with alveolar hypoventilation, serious comorbidity, and body mass index (BMI) of 60.0 to 69.9 in adult [facility]); ANA positive; and Positive antinuclear antibody on their problem list. Her primarily concern today is the Knee Pain (bilaterally) and Back Pain (lower)  Pain Assessment: Location: Right, Left Knee Radiating: Primary pain is right knee, sometimes left; pt states pain originates in lower back and moves through hips down backs of thighs and around to knees Onset: More than a  month ago Duration: Chronic pain Quality: Constant, Throbbing, Nagging Severity: 6 /10 (self-reported pain score)  Note: Reported level is compatible with observation. Clinically the patient looks like a 1/10 A 1/10 is viewed as "Mild" and described as nagging, annoying, but not interfering with basic activities of daily living (ADL). Tracey White is able to eat, bathe, get dressed, do toileting (being able to get on and off the toilet and perform personal hygiene functions), transfer (move in and out of bed or a chair without assistance), and maintain continence (able to control bladder and bowel functions). Physiologic parameters such as blood pressure and heart rate apear wnl. Information on the proper use of the pain scale provided to the patient today. When using our objective Pain Scale, levels between 6 and 10/10 are said to belong in an emergency room, as it progressively worsens from a 6/10, described as severely limiting, requiring emergency care not usually available at an outpatient pain management facility. At a 6/10 level, communication becomes difficult and requires great effort. Assistance to reach the emergency department may be required. Facial flushing and profuse sweating along with potentially dangerous increases in heart rate and blood pressure will be evident. Effect on ADL: difficult for pt to stand for long periods of time Timing: Constant Modifying factors: medications, hot showers, heating pad  Tracey White was last scheduled for an appointment on 02/04/2017 for medication management. During today's appointment we reviewed Tracey White's chronic pain status, as well as her outpatient medication regimen. Tracey White did follow up with the Rheumatologist. Tracey White admits that additional labs were performed to verify her possible Autoimmune problems. Tracey White states that Tracey White was informed that the test were indeed abnormal. Tracey White will be following up with rheumologist for further treatment options Tracey White  stats that  Tracey White needs something because Tracey White has to work. Tracey White works at a daycare. Tracey White admits that the knee pain is the worse and Tracey White is not able to stand for long periods.   The patient  reports that Tracey White does not use drugs. Her body mass index is 65.75 kg/m.  Further details on both, my assessment(s), as well as the proposed treatment plan, please see below.  Controlled Substance Pharmacotherapy Assessment REMS (Risk Evaluation and Mitigation Strategy)  Analgesic: Tramadol 50 mg TID (Tramadol 150 mg per day) MME/day: 15 mg/day.  Tracey White  03/05/2017  9:17 AM  Signed Nursing Pain Medication Assessment:  Safety precautions to be maintained throughout the outpatient stay will include: orient to surroundings, keep bed in low position, maintain call bell within reach at all times, provide assistance with transfer out of bed and ambulation.  Medication Inspection Compliance: Pill count conducted under aseptic conditions, in front of the patient. Neither the pills nor the bottle was removed from the patient's sight at any time. Once count was completed pills were immediately returned to the patient in their original bottle.  Medication: Tramadol (Ultram) Pill/Patch Count: 5 of 90 pills remain Pill/Patch Appearance: Markings consistent with prescribed medication Bottle Appearance: Standard pharmacy container. Clearly labeled. Filled Date: 1 / 53 / 2019 Last Medication intake:  Yesterday   Pharmacokinetics: Liberation and absorption (onset of action): WNL Distribution (time to peak effect): WNL Metabolism and excretion (duration of action): WNL         Pharmacodynamics: Desired effects: Analgesia: Tracey White reports >50% benefit. Functional ability: Patient reports that medication allows her to accomplish basic ADLs Clinically meaningful improvement in function (CMIF): Sustained CMIF goals met Perceived effectiveness: Described as relatively effective, allowing for increase in activities of daily living  (ADL) Undesirable effects: Side-effects or Adverse reactions: None reported Monitoring: [place] PMP: Online review of the past 27-monthperiod conducted. Compliant with practice rules and regulations Last UDS on record: Summary  Date Value Ref Range Status  12/12/2016 FINAL  Final    Comment:    ==================================================================== TOXASSURE COMP DRUG ANALYSIS,UR ==================================================================== Test                             Result       Flag       Units Drug Present and Declared for Prescription Verification   Alprazolam                     29           EXPECTED   ng/mg creat   Alpha-hydroxyalprazolam        47           EXPECTED   ng/mg creat    Source of alprazolam is a scheduled prescription medication.    Alpha-hydroxyalprazolam is an expected metabolite of alprazolam.   Gabapentin                     PRESENT      EXPECTED   Citalopram                     PRESENT      EXPECTED   Desmethylcitalopram            PRESENT      EXPECTED    Desmethylcitalopram is an expected metabolite of citalopram or    the enantiomeric form, escitalopram.   Aripiprazole  PRESENT      EXPECTED   Diltiazem                      PRESENT      EXPECTED   Metoprolol                     PRESENT      EXPECTED Drug Present not Declared for Prescription Verification   Tramadol                       262          UNEXPECTED ng/mg creat   O-Desmethyltramadol            146          UNEXPECTED ng/mg creat    Source of tramadol is a prescription medication.    O-desmethyltramadol is an expected metabolite of tramadol.   Acetaminophen                  PRESENT      UNEXPECTED   Ibuprofen                      PRESENT      UNEXPECTED   Naproxen                       PRESENT      UNEXPECTED Drug Absent but Declared for Prescription Verification   Diclofenac                     Not Detected UNEXPECTED    Diclofenac, as indicated in the  declared medication list, is not    always detected even when used as directed.   Lidocaine                      Not Detected UNEXPECTED    Lidocaine, as indicated in the declared medication list, is not    always detected even when used as directed. ==================================================================== Test                      Result    Flag   Units      Ref Range   Creatinine              118              mg/dL      >=20 ==================================================================== Declared Medications:  The flagging and interpretation on this report are based on the  following declared medications.  Unexpected results may arise from  inaccuracies in the declared medications.  **Note: The testing scope of this panel includes these medications:  Alprazolam (Xanax)  Aripiprazole (Abilify)  Diltiazem (Cardizem)  Escitalopram (Lexapro)  Gabapentin (Neurontin)  Metoprolol (Toprol)  **Note: The testing scope of this panel does not include small to  moderate amounts of these reported medications:  Diclofenac (Voltaren)  Lidocaine (Lidoderm)  **Note: The testing scope of this panel does not include following  reported medications:  Albuterol (Proventil)  Beclomethasone (QVAR)  Bumetanide (Bumex)  Losartan (Cozaar)  Montelukast (Singulair)  Potassium (K-Dur)  Potassium (Klor-Con) ==================================================================== For clinical consultation, please call [phone]. ====================================================================    UDS interpretation: Compliant          Medication Assessment Form: Reviewed. Patient indicates being compliant with therapy Treatment compliance: Compliant Risk Assessment Profile: Aberrant behavior: See prior evaluations. None  observed or detected today Comorbid factors increasing risk of overdose: See prior notes. No additional risks detected today Risk of substance use disorder (SUD):  Low Opioid Risk Tool - 03/05/17 0912      Family History of Substance Abuse   Alcohol  Positive Female    Illegal Drugs  Negative    Rx Drugs  Negative      Personal History of Substance Abuse   Alcohol  Negative    Illegal Drugs  Negative    Rx Drugs  Negative      Age   Age between 83-45 years   Yes      History of Preadolescent Sexual Abuse   History of Preadolescent Sexual Abuse  Negative or Female      Psychological Disease   Psychological Disease  Positive    Bipolar  Positive    Depression  Positive      Total Score   Opioid Risk Tool Scoring  5    Opioid Risk Interpretation  Moderate Risk      ORT Scoring interpretation table:  Score <3 = Low Risk for SUD  Score between 4-7 = Moderate Risk for SUD  Score >8 = High Risk for Opioid Abuse   Risk Mitigation Strategies:  Patient Counseling: Covered Patient-Prescriber Agreement (PPA): Present and active  Notification to other healthcare providers: Done  Pharmacologic Plan: No change in therapy, at this time.             Laboratory Chemistry  Inflammation Markers (CRP: Acute Phase) (ESR: Chronic Phase) Lab Results  Component Value Date   CRP 58.7 (H) 12/12/2016   ESRSEDRATE 59 (H) 12/12/2016                 Rheumatology Markers Lab Results  Component Value Date   RF <10.0 01/08/2017   ANA Positive (A) 01/08/2017                Renal Function Markers Lab Results  Component Value Date   BUN 12 01/13/2017   CREATININE 0.76 01/13/2017   GFRAA >60 01/13/2017   GFRNONAA >60 01/13/2017                 Hepatic Function Markers Lab Results  Component Value Date   AST 17 12/30/2016   ALT 13 (L) 12/30/2016   ALBUMIN 3.3 (L) 12/30/2016   ALKPHOS 87 12/30/2016                 Electrolytes Lab Results  Component Value Date   NA 137 01/13/2017   K 3.3 (L) 01/13/2017   CL 96 (L) 01/13/2017   CALCIUM 8.9 01/13/2017   MG 2.1 12/12/2016                 Neuropathy Markers Lab Results  Component Value  Date   VITAMINB12 255 12/25/2016   HGBA1C 6.1 (H) 05/31/2016   HIV Non Reactive 12/25/2016                 Bone Pathology Markers Lab Results  Component Value Date   25OHVITD1 9.1 (L) 12/12/2016   25OHVITD2 3.6 12/12/2016   25OHVITD3 5.5 12/12/2016                 Coagulation Parameters Lab Results  Component Value Date   PLT 313 12/30/2016   DDIMER <0.27 12/24/2016                 Cardiovascular Markers Lab Results  Component Value  Date   BNP 29.0 12/30/2016   TROPONINI <0.03 12/30/2016   HGB 10.8 (L) 12/30/2016   HCT 34.7 (L) 12/30/2016                 CA Markers No results found for: CEA, CA125, LABCA2               Note: Lab results reviewed.  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  Slight vascular congestion.  Stable mediastinal contours.  Lungs clear.  No pleural effusion or pneumothorax.  Dextroconvex thoracic scoliosis with prior spinal fixation surgery.  IMPRESSION: Enlargement of cardiac silhouette with pulmonary vascular congestion.  No definite acute infiltrate.  Electronically Signed   By: Lavonia Dana M.D.   On: 12/30/2016 19:57  Complexity Note: Imaging results reviewed. Results shared with Ms. Tarnow, using Layman's terms.                         Meds   Current Outpatient Medications:  .  albuterol (PROVENTIL HFA;VENTOLIN HFA) 108 (90 Base) MCG/ACT inhaler, Inhale 2-4 puffs by mouth every 4 hours as needed for wheezing, cough, and/or shortness of breath, Disp: 1 Inhaler, Rfl: 1 .  ARIPiprazole (ABILIFY) 2 MG tablet, Take 4 mg by mouth daily. , Disp: , Rfl:  .  budesonide-formoterol (SYMBICORT) 160-4.5 MCG/ACT inhaler,  Inhale 2 puffs into the lungs 2 (two) times daily., Disp: , Rfl:  .  bumetanide (BUMEX) 2 MG tablet, Take 2-4 mg by mouth See admin instructions. 4 mg in the AM and 2 mg in the PM, Disp: , Rfl:  .  buPROPion (WELLBUTRIN) 75 MG tablet, Take 75 mg by mouth daily., Disp: , Rfl:  .  Calcium Carb-Cholecalciferol (CALCIUM PLUS D3 ABSORBABLE) [phone] MG-UNIT CAPS, Take 1 capsule by mouth daily with breakfast., Disp: 90 capsule, Rfl: 0 .  Cholecalciferol 5000 units capsule, Take 1 capsule (5,000 Units total) by mouth daily., Disp: 90 capsule, Rfl: 0 .  diclofenac sodium (VOLTAREN) 1 % GEL, Apply 4 (four) times daily topically., Disp: , Rfl:  .  diltiazem (CARDIZEM) 120 MG tablet, Take 120 mg daily by mouth., Disp: , Rfl:  .  escitalopram (LEXAPRO) 20 MG tablet, Take 20 mg by mouth daily. , Disp: , Rfl:  .  gabapentin (NEURONTIN) 100 MG capsule, Take 100 mg by mouth 3 (three) times daily. , Disp: , Rfl:  .  lidocaine (LIDODERM) 5 %, Place 1 patch daily onto the skin. Remove & Discard patch within 12 hours or as directed by MD, Disp: , Rfl:  .  losartan (COZAAR) 25 MG tablet, Take 25 mg by mouth daily., Disp: , Rfl:  .  meloxicam (MOBIC) 15 MG tablet, Take 15 mg by mouth daily as needed for pain., Disp: , Rfl:  .  metolazone (ZAROXOLYN) 2.5 MG tablet, Take 1 tablet (2.5 mg total) by mouth daily., Disp: 3 tablet, Rfl: 0 .  metoprolol succinate (TOPROL-XL) 25 MG 24 hr tablet, Take 2 tablets daily by mouth. , Disp: , Rfl:  .  montelukast (SINGULAIR) 10 MG tablet, Take 10 mg by mouth at bedtime., Disp: , Rfl:  .  potassium chloride SA (K-DUR,KLOR-CON) 20 MEQ tablet, Take  40 mEq by mouth 2 (two) times daily., Disp: , Rfl:  .  [START ON 03/06/2017] traMADol (ULTRAM) 50 MG tablet, Take 1 tablet (50 mg total) by mouth every 8 (eight) hours as needed for severe pain., Disp: 90 tablet, Rfl: 2 No current facility-administered medications for this visit.   Facility-Administered Medications Ordered in Other Visits:  .   technetium tetrofosmin (TC-MYOVIEW) injection 32.67 millicurie, 12.45 millicurie, Intravenous, Once PRN, [place], David A, MD  ROS  Constitutional: Denies any fever or chills Gastrointestinal: No reported hemesis, hematochezia, vomiting, or acute GI distress Musculoskeletal: Denies any acute onset joint swelling, redness, loss of ROM, or weakness Neurological: No reported episodes of acute onset apraxia, aphasia, dysarthria, agnosia, amnesia, paralysis, loss of coordination, or loss of consciousness  Allergies  Ms. Deats has No Known Allergies.  [place]  Drug: Ms. Kamps  reports that Tracey White does not use drugs. Alcohol:  reports that Tracey White drinks about 0.6 - 1.2 oz of alcohol per week. Tobacco:  reports that Tracey White has been smoking cigarettes.  Tracey White has been smoking about 0.20 packs per day. Tracey White has never used smokeless tobacco. Medical:  has a past medical history of Anxiety, CHF (congestive heart failure) ([place]), Chronic heart failure ([place]), Morbid obesity ([place]), OSA (obstructive sleep apnea), Peripheral edema, and Scoliosis. Surgical: Ms. Payes  has a past surgical history that includes Tubal ligation; Hernia repair; and scoliosis repair. Family: family history includes Diabetes in her mother; Hypertension in her mother.  Constitutional Exam  General appearance: alert, cooperative and morbidly obese Vitals:   03/05/17 0855  BP: 132/84  Pulse: 99  Resp: 18  Temp: 98.1 F (36.7 C)  TempSrc: Oral  SpO2: 99%  Weight: (!) 348 lb (157.9 kg)  Height: 5' 1" (1.549 m)  Psych/Mental status: Alert, oriented x 3 (person, place, & time)       Eyes: PERLA Respiratory: No evidence of acute respiratory distress  Cervical Spine Area Exam  Skin & Axial Inspection: No masses, redness, edema, swelling, or associated skin lesions Alignment: Symmetrical Functional ROM: Unrestricted ROM      Stability: No instability detected Muscle Tone/Strength: Functionally intact. No obvious neuro-muscular anomalies  detected. Sensory (Neurological): Unimpaired Palpation: No palpable anomalies              Upper Extremity (UE) Exam    Side: Right upper extremity  Side: Left upper extremity  Skin & Extremity Inspection: Skin color, temperature, and hair growth are WNL. No peripheral edema or cyanosis. No masses, redness, swelling, asymmetry, or associated skin lesions. No contractures.  Skin & Extremity Inspection: Skin color, temperature, and hair growth are WNL. No peripheral edema or cyanosis. No masses, redness, swelling, asymmetry, or associated skin lesions. No contractures.  Functional ROM: Unrestricted ROM          Functional ROM: Unrestricted ROM          Muscle Tone/Strength: Functionally intact. No obvious neuro-muscular anomalies detected.  Muscle Tone/Strength: Functionally intact. No obvious neuro-muscular anomalies detected.  Sensory (Neurological): Unimpaired          Sensory (Neurological): Unimpaired          Palpation: No palpable anomalies              Palpation: No palpable anomalies              Specialized Test(s): Deferred         Specialized Test(s): Deferred          Thoracic Spine Area Exam  Skin &  Axial Inspection: No masses, redness, or swelling Alignment: Symmetrical Functional ROM: Unrestricted ROM Stability: No instability detected Muscle Tone/Strength: Functionally intact. No obvious neuro-muscular anomalies detected. Sensory (Neurological): Unimpaired Muscle strength & Tone: No palpable anomalies  Lumbar Spine Area Exam  Skin & Axial Inspection: No masses, redness, or swelling Alignment: Scoliosis detected Functional ROM: Unrestricted ROM      Stability: No instability detected Muscle Tone/Strength: Functionally intact. No obvious neuro-muscular anomalies detected. Sensory (Neurological): Unimpaired Palpation: No palpable anomalies       Provocative Tests: Lumbar Hyperextension and rotation test: evaluation deferred today       Lumbar Lateral bending test:  evaluation deferred today       Patrick's Maneuver: evaluation deferred today                    Gait & Posture Assessment  Ambulation: Unassisted Gait: Relatively normal for age and body habitus Posture: WNL   Lower Extremity Exam    Side: Right lower extremity  Side: Left lower extremity  Skin & Extremity Inspection: Skin color, temperature, and hair growth are WNL. No peripheral edema or cyanosis. No masses, redness, swelling, asymmetry, or associated skin lesions. No contractures.  Skin & Extremity Inspection: Skin color, temperature, and hair growth are WNL. No peripheral edema or cyanosis. No masses, redness, swelling, asymmetry, or associated skin lesions. No contractures.  Functional ROM: Adequate ROM          Functional ROM: Adequate ROM          Muscle Tone/Strength: Functionally intact. No obvious neuro-muscular anomalies detected.  Muscle Tone/Strength: Functionally intact. No obvious neuro-muscular anomalies detected.  Sensory (Neurological): Unimpaired  Sensory (Neurological): Unimpaired  Palpation: Complains of area being tender to palpation  Palpation: Complains of area being tender to palpation   Assessment  Primary Diagnosis & Pertinent Problem List: The primary encounter diagnosis was Chronic knee pain (Secondary Area of Pain) (Bilateral) (R>L). Diagnoses of Chronic low back pain (Primary Area of Pain) (Bilateral) (L>R), Chronic thoracic back pain (Fourth Area of Pain) (Bilateral) (R>L), and Chronic pain syndrome were also pertinent to this visit.  Status Diagnosis  Persistent Stable Stable 1. Chronic knee pain (Secondary Area of Pain) (Bilateral) (R>L)   2. Chronic low back pain (Primary Area of Pain) (Bilateral) (L>R)   3. Chronic thoracic back pain (Fourth Area of Pain) (Bilateral) (R>L)   4. Chronic pain syndrome     Problems updated and reviewed during this visit: No problems updated. Plan of Care  Pharmacotherapy (Medications Ordered): Meds ordered this  encounter  Medications  . traMADol (ULTRAM) 50 MG tablet    Sig: Take 1 tablet (50 mg total) by mouth every 8 (eight) hours as needed for severe pain.    Dispense:  90 tablet    Refill:  2    Do not place this medication, or any other prescription from our practice, on "Automatic Refill". Patient may have prescription filled one day early if pharmacy is closed on scheduled refill date. Do not fill until: 03/06/2017 To last until: 04/05/2017    Order Specific Question:   Supervising Provider    Answer:   Milinda Pointer [phone]   New Prescriptions   No medications on file   Medications administered today: Lilliana A. Pischke had no medications administered during this visit. Lab-work, procedure(s), and/or referral(s): No orders of the defined types were placed in this encounter.  Imaging and/or referral(s): None  Interventional therapies: Planned, scheduled, and/or pending:    Will discuss  possible fluoroscopic guided knee injection    Considering: Diagnostic bilateral LESI Diagnostic bilateral lumbar facet nerve block Possible bilateral lumbar facet RFA Bilateral Hyalgan series Diagnostic bilateral intra-articular knee injection Diagnosticbilateral genicular nerve block Possible bilateral genicular RFA   PRN Procedures: None at this time    Provider-requested follow-up: Return in about 3 months (around 06/02/2017) for MedMgmt with Me Donella Stade Edison Pace).  Future Appointments  Date Time Provider [place]  03/07/2017  8:00 AM [facility]  04/10/2017  9:00 AM Georgina Peer, RD [facility] None  06/02/2017  8:45 AM Vevelyn Francois, NP [facility] None  06/12/2017  8:20 AM Alisa Graff, FNP [facility] None   Primary Care Physician: Zeb Comfort, MD Location: [facility] Outpatient Pain Management Facility Note by: Vevelyn Francois NP Date: 03/05/2017; Time: 10:04 AM  Pain Score Disclaimer: We use the NRS-11 scale. This is a self-reported, subjective  measurement of pain severity with only modest accuracy. It is used primarily to identify changes within a particular patient. It must be understood that outpatient pain scales are significantly less accurate that those used for research, where they can be applied under ideal controlled circumstances with minimal exposure to variables. In reality, the score is likely to be a combination of pain intensity and pain affect, where pain affect describes the degree of emotional arousal or changes in action readiness caused by the sensory experience of pain. Factors such as social and work situation, setting, emotional state, anxiety levels, expectation, and prior pain experience may influence pain perception and show large inter-individual differences that may also be affected by time variables.  Patient instructions provided during this appointment: Patient Instructions    ____________________________________________________________________________________________  Medication Rules  Applies to: All patients receiving prescriptions (written or electronic).  Pharmacy of record: Pharmacy where electronic prescriptions will be sent. If written prescriptions are taken to a different pharmacy, please inform the nursing staff. The pharmacy listed in the electronic medical record should be the one where you would like electronic prescriptions to be sent.  Prescription refills: Only during scheduled appointments. Applies to both, written and electronic prescriptions.  NOTE: The following applies primarily to controlled substances (Opioid* Pain Medications).   Patient's responsibilities: 1. Pain Pills: Bring all pain pills to every appointment (except for procedure appointments). 2. Pill Bottles: Bring pills in original pharmacy bottle. Always bring newest bottle. Bring bottle, even if empty. 3. Medication refills: You are responsible for knowing and keeping track of what medications you need refilled. The day  before your appointment, write a list of all prescriptions that need to be refilled. Bring that list to your appointment and give it to the admitting nurse. Prescriptions will be written only during appointments. If you forget a medication, it will not be "Called in", "Faxed", or "electronically sent". You will need to get another appointment to get these prescribed. 4. Prescription Accuracy: You are responsible for carefully inspecting your prescriptions before leaving our office. Have the discharge nurse carefully go over each prescription with you, before taking them home. Make sure that your name is accurately spelled, that your address is correct. Check the name and dose of your medication to make sure it is accurate. Check the number of pills, and the written instructions to make sure they are clear and accurate. Make sure that you are given enough medication to last until your next medication refill appointment. 5. Taking Medication: Take medication as prescribed. Never take more pills than instructed. Never take medication more frequently  than prescribed. Taking less pills or less frequently is permitted and encouraged, when it comes to controlled substances (written prescriptions).  6. Inform other Doctors: Always inform, all of your healthcare providers, of all the medications you take. 7. Pain Medication from other Providers: You are not allowed to accept any additional pain medication from any other Doctor or Healthcare provider. There are two exceptions to this rule. (see below) In the event that you require additional pain medication, you are responsible for notifying us, as stated below. 8. Medication Agreement: You are responsible for carefully reading and following our Medication Agreement. This must be signed before receiving any prescriptions from our practice. Safely store a copy of your signed Agreement. Violations to the Agreement will result in no further prescriptions. (Additional copies  of our Medication Agreement are available upon request.) 9. Laws, Rules, & Regulations: All patients are expected to follow all Federal and [employer], [employer], Rules, [employer]. Ignorance of the Laws does not constitute a valid excuse. The use of any illegal substances is prohibited. 10. Adopted CDC guidelines & recommendations: Target dosing levels will be at or below 60 MME/day. Use of benzodiazepines** is not recommended.  Exceptions: There are only two exceptions to the rule of not receiving pain medications from other Healthcare Providers. 1. Exception #1 (Emergencies): In the event of an emergency (i.e.: accident requiring emergency care), you are allowed to receive additional pain medication. However, you are responsible for: As soon as you are able, call our office [phone], at any time of the day or night, and leave a message stating your name, the date and nature of the emergency, and the name and dose of the medication prescribed. In the event that your call is answered by a member of our staff, make sure to document and save the date, time, and the name of the person that took your information.  2. Exception #2 (Planned Surgery): In the event that you are scheduled by another doctor or dentist to have any type of surgery or procedure, you are allowed (for a period no longer than 30 days), to receive additional pain medication, for the acute post-op pain. However, in this case, you are responsible for picking up a copy of our "Post-op Pain Management for Surgeons" handout, and giving it to your surgeon or dentist. This document is available at our office, and does not require an appointment to obtain it. Simply go to our office during business hours (Monday-Thursday from 8:00 AM to 4:00 PM) (Friday 8:00 AM to 12:00 Noon) or if you have a scheduled appointment with [place], prior to your surgery, and ask for it by name. In addition, you will need to provide [place] with your name, name of your  surgeon, type of surgery, and date of procedure or surgery.  *Opioid medications include: morphine, codeine, oxycodone, oxymorphone, hydrocodone, hydromorphone, meperidine, tramadol, tapentadol, buprenorphine, fentanyl, methadone. **Benzodiazepine medications include: diazepam (Valium), alprazolam (Xanax), clonazepam (Klonopine), lorazepam (Ativan), clorazepate (Tranxene), chlordiazepoxide (Librium), estazolam (Prosom), oxazepam (Serax), temazepam (Restoril), triazolam (Halcion)  ____________________________________________________________________________________________  ____________________________________________________________________________________________  Pain Scale  Introduction: The pain score used by this practice is the Verbal Numerical Rating Scale (VNRS-11). This is an 11-point scale. It is for adults and children 10 years or older. There are significant differences in how the pain score is reported, used, and applied. Forget everything you learned in the past and learn this scoring system.  General Information: The scale should reflect your current level of pain. Unless you are specifically asked  for the level of your worst pain, or your average pain. If you are asked for one of these two, then it should be understood that it is over the past 24 hours.  Basic Activities of Daily Living (ADL): Personal hygiene, dressing, eating, transferring, and using restroom.  Instructions: Most patients tend to report their level of pain as a combination of two factors, their physical pain and their psychosocial pain. This last one is also known as "suffering" and it is reflection of how physical pain affects you socially and psychologically. From now on, report them separately. From this point on, when asked to report your pain level, report only your physical pain. Use the following table for reference.  Pain Clinic Pain Levels (0-5/10)  Pain Level Score  Description  No Pain 0   Mild pain  1 Nagging, annoying, but does not interfere with basic activities of daily living (ADL). Patients are able to eat, bathe, get dressed, toileting (being able to get on and off the toilet and perform personal hygiene functions), transfer (move in and out of bed or a chair without assistance), and maintain continence (able to control bladder and bowel functions). Blood pressure and heart rate are unaffected. A normal heart rate for a healthy adult ranges from 60 to 100 bpm (beats per minute).   Mild to moderate pain 2 Noticeable and distracting. Impossible to hide from other people. More frequent flare-ups. Still possible to adapt and function close to normal. It can be very annoying and may have occasional stronger flare-ups. With discipline, patients may get used to it and adapt.   Moderate pain 3 Interferes significantly with activities of daily living (ADL). It becomes difficult to feed, bathe, get dressed, get on and off the toilet or to perform personal hygiene functions. Difficult to get in and out of bed or a chair without assistance. Very distracting. With effort, it can be ignored when deeply involved in activities.   Moderately severe pain 4 Impossible to ignore for more than a few minutes. With effort, patients may still be able to manage work or participate in some social activities. Very difficult to concentrate. Signs of autonomic nervous system discharge are evident: dilated pupils (mydriasis); mild sweating (diaphoresis); sleep interference. Heart rate becomes elevated (>115 bpm). Diastolic blood pressure (lower number) rises above 100 mmHg. Patients find relief in laying down and not moving.   Severe pain 5 Intense and extremely unpleasant. Associated with frowning face and frequent crying. Pain overwhelms the senses.  Ability to do any activity or maintain social relationships becomes significantly limited. Conversation becomes difficult. Pacing back and forth is common, as getting into a  comfortable position is nearly impossible. Pain wakes you up from deep sleep. Physical signs will be obvious: pupillary dilation; increased sweating; goosebumps; brisk reflexes; cold, clammy hands and feet; nausea, vomiting or dry heaves; loss of appetite; significant sleep disturbance with inability to fall asleep or to remain asleep. When persistent, significant weight loss is observed due to the complete loss of appetite and sleep deprivation.  Blood pressure and heart rate becomes significantly elevated. Caution: If elevated blood pressure triggers a pounding headache associated with blurred vision, then the patient should immediately seek attention at an urgent or emergency care unit, as these may be signs of an impending stroke.    Emergency Department Pain Levels (6-10/10)  Emergency Room Pain 6 Severely limiting. Requires emergency care and should not be seen or managed at an outpatient pain management facility. Communication becomes difficult  and requires great effort. Assistance to reach the emergency department may be required. Facial flushing and profuse sweating along with potentially dangerous increases in heart rate and blood pressure will be evident.   Distressing pain 7 Self-care is very difficult. Assistance is required to transport, or use restroom. Assistance to reach the emergency department will be required. Tasks requiring coordination, such as bathing and getting dressed become very difficult.   Disabling pain 8 Self-care is no longer possible. At this level, pain is disabling. The individual is unable to do even the most "basic" activities such as walking, eating, bathing, dressing, transferring to a bed, or toileting. Fine motor skills are lost. It is difficult to think clearly.   Incapacitating pain 9 Pain becomes incapacitating. Thought processing is no longer possible. Difficult to remember your own name. Control of movement and coordination are lost.   The worst pain  imaginable 10 At this level, most patients pass out from pain. When this level is reached, collapse of the autonomic nervous system occurs, leading to a sudden drop in blood pressure and heart rate. This in turn results in a temporary and dramatic drop in blood flow to the brain, leading to a loss of consciousness. Fainting is one of the body's self defense mechanisms. Passing out puts the brain in a calmed state and causes it to shut down for a while, in order to begin the healing process.    Summary: 1. Refer to this scale when providing [place] with your pain level. 2. Be accurate and careful when reporting your pain level. This will help with your care. 3. Over-reporting your pain level will lead to loss of credibility. 4. Even a level of 1/10 means that there is pain and will be treated at our facility. 5. High, inaccurate reporting will be documented as "Symptom Exaggeration", leading to loss of credibility and suspicions of possible secondary gains such as obtaining more narcotics, or wanting to appear disabled, for fraudulent reasons. 6. Only pain levels of 5 or below will be seen at our facility. 7. Pain levels of 6 and above will be sent to the Emergency Department and the appointment cancelled. ____________________________________________________________________________________________   BMI Assessment: Estimated body mass index is 65.75 kg/m as calculated from the following:   Height as of this encounter: 5' 1" (1.549 m).   Weight as of this encounter: 348 lb (157.9 kg).  BMI interpretation table: BMI level Category Range association with higher incidence of chronic pain  <18 kg/m2 Underweight   18.5-24.9 kg/m2 Ideal body weight   25-29.9 kg/m2 Overweight Increased incidence by 20%  30-34.9 kg/m2 Obese (Class I) Increased incidence by 68%  35-39.9 kg/m2 Severe obesity (Class II) Increased incidence by 136%  >40 kg/m2 Extreme obesity (Class III) Increased incidence by 254%   BMI  Readings from Last 4 Encounters:  03/05/17 65.75 kg/m  02/18/17 66.11 kg/m  02/04/17 64.43 kg/m  01/17/17 66.70 kg/m   Wt Readings from Last 4 Encounters:  03/05/17 (!) 348 lb (157.9 kg)  02/18/17 (!) 349 lb 14.4 oz (158.7 kg)  02/04/17 (!) 341 lb (154.7 kg)  01/17/17 (!) 353 lb (160.1 kg)

## 2017-03-07 ENCOUNTER — Ambulatory Visit: Admission: RE | Admit: 2017-03-07 | Payer: Medicaid Other | Source: Ambulatory Visit

## 2017-03-19 ENCOUNTER — Inpatient Hospital Stay: Payer: Medicaid Other | Attending: Hematology and Oncology | Admitting: Hematology and Oncology

## 2017-03-19 ENCOUNTER — Encounter: Payer: Self-pay | Admitting: Hematology and Oncology

## 2017-03-19 ENCOUNTER — Inpatient Hospital Stay: Payer: Medicaid Other | Admitting: Hematology and Oncology

## 2017-03-19 ENCOUNTER — Inpatient Hospital Stay: Payer: Medicaid Other

## 2017-03-19 VITALS — BP 145/77 | HR 109 | Temp 98.4°F | Resp 20 | Ht 61.0 in | Wt 348.3 lb

## 2017-03-19 DIAGNOSIS — G8929 Other chronic pain: Secondary | ICD-10-CM

## 2017-03-19 DIAGNOSIS — D509 Iron deficiency anemia, unspecified: Secondary | ICD-10-CM | POA: Diagnosis present

## 2017-03-19 DIAGNOSIS — I509 Heart failure, unspecified: Secondary | ICD-10-CM

## 2017-03-19 DIAGNOSIS — F17210 Nicotine dependence, cigarettes, uncomplicated: Secondary | ICD-10-CM

## 2017-03-19 DIAGNOSIS — R200 Anesthesia of skin: Secondary | ICD-10-CM | POA: Diagnosis not present

## 2017-03-19 DIAGNOSIS — R0609 Other forms of dyspnea: Secondary | ICD-10-CM | POA: Diagnosis not present

## 2017-03-19 DIAGNOSIS — H538 Other visual disturbances: Secondary | ICD-10-CM

## 2017-03-19 DIAGNOSIS — N920 Excessive and frequent menstruation with regular cycle: Secondary | ICD-10-CM

## 2017-03-19 DIAGNOSIS — R5383 Other fatigue: Secondary | ICD-10-CM

## 2017-03-19 DIAGNOSIS — G4733 Obstructive sleep apnea (adult) (pediatric): Secondary | ICD-10-CM

## 2017-03-19 DIAGNOSIS — M7989 Other specified soft tissue disorders: Secondary | ICD-10-CM | POA: Diagnosis not present

## 2017-03-19 DIAGNOSIS — Z79899 Other long term (current) drug therapy: Secondary | ICD-10-CM | POA: Diagnosis not present

## 2017-03-19 DIAGNOSIS — M545 Low back pain: Secondary | ICD-10-CM | POA: Diagnosis not present

## 2017-03-19 DIAGNOSIS — F5089 Other specified eating disorder: Secondary | ICD-10-CM

## 2017-03-19 DIAGNOSIS — R61 Generalized hyperhidrosis: Secondary | ICD-10-CM

## 2017-03-19 LAB — CBC WITH DIFFERENTIAL/PLATELET
BASOS ABS: 0.1 10*3/uL (ref 0–0.1)
Basophils Relative: 1 %
EOS PCT: 2 %
Eosinophils Absolute: 0.2 10*3/uL (ref 0–0.7)
HEMATOCRIT: 36.6 % (ref 35.0–47.0)
Hemoglobin: 11.5 g/dL — ABNORMAL LOW (ref 12.0–16.0)
LYMPHS ABS: 2.2 10*3/uL (ref 1.0–3.6)
LYMPHS PCT: 21 %
MCH: 22.3 pg — AB (ref 26.0–34.0)
MCHC: 31.5 g/dL — ABNORMAL LOW (ref 32.0–36.0)
MCV: 70.8 fL — AB (ref 80.0–100.0)
MONO ABS: 0.6 10*3/uL (ref 0.2–0.9)
Monocytes Relative: 6 %
NEUTROS ABS: 7.4 10*3/uL — AB (ref 1.4–6.5)
Neutrophils Relative %: 70 %
PLATELETS: 317 10*3/uL (ref 150–440)
RBC: 5.17 MIL/uL (ref 3.80–5.20)
RDW: 19.6 % — ABNORMAL HIGH (ref 11.5–14.5)
WBC: 10.4 10*3/uL (ref 3.6–11.0)

## 2017-03-19 LAB — IRON AND TIBC
Iron: 74 ug/dL (ref 28–170)
SATURATION RATIOS: 19 % (ref 10.4–31.8)
TIBC: 399 ug/dL (ref 250–450)
UIBC: 325 ug/dL

## 2017-03-19 LAB — FOLATE: FOLATE: 7 ng/mL (ref >5.9)

## 2017-03-19 LAB — FERRITIN: FERRITIN: 15 ng/mL (ref 11–307)

## 2017-03-19 LAB — TSH: TSH: 1.005 u[IU]/mL (ref 0.350–4.500)

## 2017-03-19 NOTE — Progress Notes (Signed)
[facility] day:  03/19/2017  Chief Complaint: Tracey White is a 37 y.o. female with anemia who is referred in consultation by Dr. Mcneil Sober in consultation for assessment and management.  HPI:  The patient established care with Dr Pricilla Riffle approximately 2 - 3 months ago. Routine labs demonstrated that patient was anemic. She states that she was given some medication (? oral iron), but her blood was "still low".   CBC on 03/11/2017 included a hematocrit of 33.7, hemoglobin 9.8, MCV 75, platelets 309,000, white count 9300 with an ANC of 6800.  Differential was unremarkable.  Creatinine was 0.7.  CBC on 12/18/2013 revealed a hematocrit 37.5, hemoglobin 11.7 and MCV 82.  She developed microcytic indices in 05/2014 and became mildly anemic in 11/2014.  Review of outside labs dating back to 03/14/2015 revealed a persistent microcytic anemia.  At that time, hematocrit was 33.7, hemoglobin 10.1 and MCV 78.7.  Hemoglobin electrophoresis on 02/21/2016 revealed 98.3% HgA and 1.7% Hgb A2.  The low Hgb A2 may indicate an alpha thalassemia or iron deficiency.   ANA was + on 03/03/2017 (1:1280 centromere pattern; 2.3 AI centromere AB screen).  Urinalysis on 02/27/2017 revealed no RBCs.  Ferritin was 11 on 02/20/2016.  Iron studies on 01/09/2017 revealed 8% saturation and TIBC 390.  TSH and free T4 were normal on 01/09/2017.  Folate was 6.9 and B12 395 on 02/20/2016.   The patient was referred to rheumatology. She has been seen in consult by Dr. Meda Coffee. Patient is scheduled for follow up appointments for further lab testing.   She eats well. She eats meat on a daily basis, and green leafy vegetables several days a well. She denies bleeding; no hematochezia or melena. She has heavy menses. She goes through about 5 or 6 pads a day for the 7 days of her menstrual cycles. She states, "I get real tired. I feel out of it".  She confirms ice pica for the last year. Patient is  on oral iron supplementation once a day, however she does not take it with vitamin C.   Patient has numbness and tingling in her lower extremities associated with prolonged sitting. Patient has chronic lower back pain. Patient has pain, swelling, and stiffness in her hands.   She notes marked fatigue. She experiences significant sweating episodes. She has blurred vision that prompted her to make an appointment with an ophthalmologist. She sees opthalmology in March. She confirms exertional shortness of breath. She states, "I can tell when my fluid gets high. I have congestive heart failure. I take Bumex and Zaroxolyn". Patient denies chest pain and palpitations. Patient takes potassium supplements. Patient has to sleep with her head elevated at night. She notes that she often wakes "very very short of breath".   Patient denies any family history that is significant for any type of oncologic or hematologic disorder.     Past Medical History:  Diagnosis Date  . Anxiety   . CHF (congestive heart failure) ([place])   . Chronic heart failure ([place])   . Morbid obesity ([place])   . OSA (obstructive sleep apnea)    uses CPAP at home  . Peripheral edema   . Scoliosis     Past Surgical History:  Procedure Laterality Date  . HERNIA REPAIR    . scoliosis repair    . TUBAL LIGATION      Family History  Problem Relation Age of Onset  . Diabetes Mother   .  Hypertension Mother   . Cancer Maternal Grandmother     Social History:  reports that she quit smoking about 4 weeks ago. Her smoking use included cigarettes. She smoked 0.20 packs per day. she has never used smokeless tobacco. She reports that she drinks about 0.6 - 1.2 oz of alcohol per week. She reports that she does not use drugs.  She quit smoking 1 month ago.  She smoked < 1 pack/day since age 20.  She denies any exposure to radiations or toxins.  She lives in [place].  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  The  patient is alone today.  Allergies: No Known Allergies  Current Medications: Current Outpatient Medications  Medication Sig Dispense Refill  . albuterol (PROVENTIL HFA;VENTOLIN HFA) 108 (90 Base) MCG/ACT inhaler Inhale 2-4 puffs by mouth every 4 hours as needed for wheezing, cough, and/or shortness of breath 1 Inhaler 1  . ARIPiprazole (ABILIFY) 2 MG tablet Take 4 mg by mouth daily.     . budesonide-formoterol (SYMBICORT) 160-4.5 MCG/ACT inhaler Inhale 2 puffs into the lungs 2 (two) times daily.    . bumetanide (BUMEX) 2 MG tablet Take 2-4 mg by mouth See admin instructions. 4 mg in the AM and 2 mg in the PM    . buPROPion (WELLBUTRIN) 75 MG tablet Take 75 mg by mouth daily.    . Calcium Carb-Cholecalciferol (CALCIUM PLUS D3 ABSORBABLE) [id number] MG-UNIT CAPS Take 1 capsule by mouth daily with breakfast. 90 capsule 0  . Cholecalciferol 5000 units capsule Take 1 capsule (5,000 Units total) by mouth daily. 90 capsule 0  . diclofenac sodium (VOLTAREN) 1 % GEL Apply 4 (four) times daily topically.    Marland Kitchen diltiazem (CARDIZEM) 120 MG tablet Take 120 mg daily by mouth.    . escitalopram (LEXAPRO) 20 MG tablet Take 20 mg by mouth daily.     Marland Kitchen gabapentin (NEURONTIN) 100 MG capsule Take 100 mg by mouth 3 (three) times daily.     Marland Kitchen lidocaine (LIDODERM) 5 % Place 1 patch daily onto the skin. Remove & Discard patch within 12 hours or as directed by MD    . losartan (COZAAR) 25 MG tablet Take 25 mg by mouth daily.    . meloxicam (MOBIC) 15 MG tablet Take 15 mg by mouth daily as needed for pain.    . metolazone (ZAROXOLYN) 2.5 MG tablet Take 1 tablet (2.5 mg total) by mouth daily. 3 tablet 0  . metoprolol succinate (TOPROL-XL) 25 MG 24 hr tablet Take 2 tablets daily by mouth.     . montelukast (SINGULAIR) 10 MG tablet Take 10 mg by mouth at bedtime.    . potassium chloride SA (K-DUR,KLOR-CON) 20 MEQ tablet Take 40 mEq by mouth 2 (two) times daily.    . traMADol (ULTRAM) 50 MG tablet Take 1 tablet (50 mg  total) by mouth every 8 (eight) hours as needed for severe pain. 90 tablet 2   No current facility-administered medications for this visit.    Facility-Administered Medications Ordered in Other Visits  Medication Dose Route Frequency Provider Last Rate Last Dose  . technetium tetrofosmin (TC-MYOVIEW) injection 01.09 millicurie  32.35 millicurie Intravenous Once PRN Martinique, David A, MD        Review of Systems:  GENERAL:  Fatigue. Sweats.  No fevers or weight loss. PERFORMANCE STATUS (ECOG):  1 HEENT:  Blurred vision (ophthalmology appointment in 03/2017).  Recent URI.  No runny nose, sore throat, mouth sores  or tenderness. Lungs: Exertional shortness of breath. No cough.  No hemoptysis. Cardiac:  No chest pain, palpitations, orthopnea, or PND.  CHF. GI:  No nausea, vomiting, diarrhea, constipation, melena or hematochezia. GU:  No urgency, frequency, dysuria, or hematuria. Musculoskeletal:  No back pain.  Knee issues.  No muscle tenderness. Extremities:  Pain, swelling, and stiffness in hands.  Skin:  No rashes or skin changes. Neuro:  Numbness and tingling with prolonged sitting.  No headache, weakness, balance or coordination issues. Endocrine:  No diabetes, thyroid issues, hot flashes or night sweats. Psych:  No mood changes, depression or anxiety. Pain:  No focal pain. Review of systems:  All other systems reviewed and found to be negative.  Physical Exam: Blood pressure (!) 145/77, pulse (!) 109, temperature 98.4 F (36.9 C), temperature source Tympanic, resp. rate 20, height '5\' 1"'$  (1.549 m), weight (!) 348 lb 5.2 oz (158 kg), last menstrual period 02/26/2017, SpO2 94 %. GENERAL:  Well developed, well nourished, heavyset woman sitting comfortably in the exam room in no acute distress. MENTAL STATUS:  Alert and oriented to person, place and time. HEAD:  Black hair.  Normocephalic, atraumatic, face symmetric, no Cushingoid features. EYES:  Brown eyes.  Pupils equal round and  reactive to light and accomodation.  No conjunctivitis or scleral icterus. ENT:  Oropharynx clear without lesion.  Tongue normal. Mucous membranes moist.  RESPIRATORY:  Clear to auscultation without rales, wheezes or rhonchi. CARDIOVASCULAR:  Regular rate and rhythm without murmur, rub or gallop. ABDOMEN:  Soft, slightly tender RUQ without guarding or rebound tenderness.  Active bowel sounds, and no hepatosplenomegaly.  No masses. SKIN:  No rashes, ulcers or lesions. EXTREMITIES: No edema, no skin discoloration or tenderness.  No palpable cords. LYMPH NODES: No palpable cervical, supraclavicular, axillary or inguinal adenopathy  NEUROLOGICAL: Unremarkable. PSYCH:  Appropriate.   No visits with results within 3 Day(s) from this visit.  Latest known visit with results is:  Office Visit on 01/13/2017  Component Date Value Ref Range Status  . Sodium 01/13/2017 137  135 - 145 mmol/L Final  . Potassium 01/13/2017 3.3* 3.5 - 5.1 mmol/L Final  . Chloride 01/13/2017 96* 101 - 111 mmol/L Final  . CO2 01/13/2017 32  22 - 32 mmol/L Final  . Glucose, Bld 01/13/2017 96  65 - 99 mg/dL Final  . BUN 01/13/2017 12  6 - 20 mg/dL Final  . Creatinine, Ser 01/13/2017 0.76  0.44 - 1.00 mg/dL Final  . Calcium 01/13/2017 8.9  8.9 - 10.3 mg/dL Final  . GFR calc non Af Amer 01/13/2017 >60  >60 mL/min Final  . GFR calc Af Amer 01/13/2017 >60  >60 mL/min Final   Comment: (NOTE) The eGFR has been calculated using the CKD EPI equation. This calculation has not been validated in all clinical situations. eGFR's persistently <60 mL/min signify possible Chronic Kidney Disease.   . Anion gap 01/13/2017 9  5 - 15 Final    Assessment:  DELIANA White is a 37 y.o. female with iron deficiency anemia likely secondary to heavy menses.  She developed microcytic indices in 05/2014 and became mildly anemic in 11/2014. Diet is good.  She has ice pica.  She denies any melena, hematochezia or hematuria.  She is on oral  iron.  Labs revealed a + ANA (1:1280 centromere pattern; 2.3 AI centromere AB screen) on 03/03/2017.  Urinalysis on 02/27/2017 revealed no RBCs.  Ferritin was 11 on 02/20/2016.  Iron studies on 01/09/2017 revealed 8% saturation and  TIBC 390.  Normal studies included: TSH, free T4, folate, and B12 on 02/20/2016.   Hemoglobin electrophoresis on 02/21/2016 revealed 98.3% HgA and 1.7% Hgb A2.  The low Hgb A2 may indicate an alpha thalassemia or iron deficiency.   Symptomatically, she is fatigued.  Exam is unremarkable.  Plan: 1.  Labs today: CBC with diff, ferritin, iron studies, B12, folate, TSH 2.  Discuss symptomatic microcytic anemia.  Previous ferritin low at 11. Labs consistent with iron deficiency anemia.  3.  Discuss oral iron supplementation. Patient to add vitamin C source to current dose of ferrous sulfate. Patient encouraged to increase to BID.  Discuss potential need for intravenous iron replacement with weekly Venofer 241m x 3.  4.  Preauthorize Venofer. 5.  RTC in 1 month for MD assessment, labs (CBC with diff, ferritin - day before), +/- Venofer   BHonor Loh NP 03/19/2017, 3:12 PM   I saw and evaluated the patient, participating in the key portions of the service and reviewing pertinent diagnostic studies and records.  I reviewed the nurse practitioner's note and agree with the findings and the plan.  The assessment and plan were discussed with the patient.  Several questions were asked by the patient and answered.   MNolon Stalls MD 03/19/2017,3:12 PM

## 2017-03-19 NOTE — Progress Notes (Deleted)
[facility] day:  03/19/2017  Chief Complaint: Tracey White is a 37 y.o. female with anemia who is referred in consultation by Dr. Mcneil Sober in consultation for assessment and management.  HPI: ***  CBCC on 03/11/2017 included a hematocrit of 33.7, hemoglobin 9.8, MCV 75, platelets 309,000, white count 9300 with an ANC of 6800.  Differential was unremarkable.  Creatinine was 0.7.  CBC on 12/18/2013 revealed hematocrit 37.5, hemoglobin 11.7 and MCV 82.  She developed microcytic indices in 05/2014 and became mildly anemic in 11/2014.  Review of prior outside labs dating back to 03/14/2015 revealed a persistent microcytic anemia.  At that time, hematocrit was 33.7, hemoglobin 10.1 and MCV 78.7.  Hemoglobin electrophoresis on 02/21/2016 revealed 98.3% HgA and 1.7% Hgb A2.  The low Hgb A2 may indicate an alpha thalassemia or iron deficiency.   ANA was + on 03/03/2017 with a 1:1280 centromere pattern and a 2.3 AI anti-centromere B antibody.  Urinalysis on 02/27/2017 revealed no RBCs.  Ferritin was 11 on 02/20/2016.  Iron studies on 01/09/2017 revealed 8% saturation and TIBC 390.  TSH and free T4 were normal on 01/09/2017.  Folate was 6.9 and B12 395 on 02/20/2016.     Past Medical History:  Diagnosis Date  . Anxiety   . CHF (congestive heart failure) ([place])   . Chronic heart failure ([place])   . Morbid obesity ([place])   . OSA (obstructive sleep apnea)    uses CPAP at home  . Peripheral edema   . Scoliosis     Past Surgical History:  Procedure Laterality Date  . HERNIA REPAIR    . scoliosis repair    . TUBAL LIGATION      Family History  Problem Relation Age of Onset  . Diabetes Mother   . Hypertension Mother     Social History:  reports that she has been smoking cigarettes.  She has been smoking about 0.20 packs per day. she has never used smokeless tobacco. She reports that she drinks about 0.6 - 1.2 oz of alcohol per week. She reports  that she does not use drugs.  The patient is accompanied by *** alone today.  Allergies: No Known Allergies  Current Medications: Current Outpatient Medications  Medication Sig Dispense Refill  . albuterol (PROVENTIL HFA;VENTOLIN HFA) 108 (90 Base) MCG/ACT inhaler Inhale 2-4 puffs by mouth every 4 hours as needed for wheezing, cough, and/or shortness of breath 1 Inhaler 1  . ARIPiprazole (ABILIFY) 2 MG tablet Take 4 mg by mouth daily.     . budesonide-formoterol (SYMBICORT) 160-4.5 MCG/ACT inhaler Inhale 2 puffs into the lungs 2 (two) times daily.    . bumetanide (BUMEX) 2 MG tablet Take 2-4 mg by mouth See admin instructions. 4 mg in the AM and 2 mg in the PM    . buPROPion (WELLBUTRIN) 75 MG tablet Take 75 mg by mouth daily.    . Calcium Carb-Cholecalciferol (CALCIUM PLUS D3 ABSORBABLE) [phone] MG-UNIT CAPS Take 1 capsule by mouth daily with breakfast. 90 capsule 0  . Cholecalciferol 5000 units capsule Take 1 capsule (5,000 Units total) by mouth daily. 90 capsule 0  . diclofenac sodium (VOLTAREN) 1 % GEL Apply 4 (four) times daily topically.    Marland Kitchen diltiazem (CARDIZEM) 120 MG tablet Take 120 mg daily by mouth.    . escitalopram (LEXAPRO) 20 MG tablet Take 20 mg by mouth daily.     Marland Kitchen gabapentin (NEURONTIN) 100 MG capsule Take 100  mg by mouth 3 (three) times daily.     Marland Kitchen lidocaine (LIDODERM) 5 % Place 1 patch daily onto the skin. Remove & Discard patch within 12 hours or as directed by MD    . losartan (COZAAR) 25 MG tablet Take 25 mg by mouth daily.    . meloxicam (MOBIC) 15 MG tablet Take 15 mg by mouth daily as needed for pain.    . metolazone (ZAROXOLYN) 2.5 MG tablet Take 1 tablet (2.5 mg total) by mouth daily. 3 tablet 0  . metoprolol succinate (TOPROL-XL) 25 MG 24 hr tablet Take 2 tablets daily by mouth.     . montelukast (SINGULAIR) 10 MG tablet Take 10 mg by mouth at bedtime.    . potassium chloride SA (K-DUR,KLOR-CON) 20 MEQ tablet Take 40 mEq by mouth 2 (two) times daily.    .  traMADol (ULTRAM) 50 MG tablet Take 1 tablet (50 mg total) by mouth every 8 (eight) hours as needed for severe pain. 90 tablet 2   No current facility-administered medications for this visit.    Facility-Administered Medications Ordered in Other Visits  Medication Dose Route Frequency Provider Last Rate Last Dose  . technetium tetrofosmin (TC-MYOVIEW) injection 16.10 millicurie  96.04 millicurie Intravenous Once PRN Martinique, David A, MD        Review of Systems:  GENERAL:  Feels good.  Active.  No fevers, sweats or weight loss. PERFORMANCE STATUS (ECOG):  *** HEENT:  No visual changes, runny nose, sore throat, mouth sores or tenderness. Lungs: No shortness of breath or cough.  No hemoptysis. Cardiac:  No chest pain, palpitations, orthopnea, or PND. GI:  No nausea, vomiting, diarrhea, constipation, melena or hematochezia. GU:  No urgency, frequency, dysuria, or hematuria. Musculoskeletal:  No back pain.  No joint pain.  No muscle tenderness. Extremities:  No pain or swelling. Skin:  No rashes or skin changes. Neuro:  No headache, numbness or weakness, balance or coordination issues. Endocrine:  No diabetes, thyroid issues, hot flashes or night sweats. Psych:  No mood changes, depression or anxiety. Pain:  No focal pain. Review of systems:  All other systems reviewed and found to be negative.  Physical Exam: Last menstrual period 02/26/2017. GENERAL:  Well developed, well nourished, **man sitting comfortably in the exam room in no acute distress. MENTAL STATUS:  Alert and oriented to person, place and time. HEAD:  *** hair.  Normocephalic, atraumatic, face symmetric, no Cushingoid features. EYES:  *** eyes.  Pupils equal round and reactive to light and accomodation.  No conjunctivitis or scleral icterus. ENT:  Oropharynx clear without lesion.  Tongue normal. Mucous membranes moist.  RESPIRATORY:  Clear to auscultation without rales, wheezes or rhonchi. CARDIOVASCULAR:  Regular rate and  rhythm without murmur, rub or gallop. ABDOMEN:  Soft, non-tender, with active bowel sounds, and no hepatosplenomegaly.  No masses. SKIN:  No rashes, ulcers or lesions. EXTREMITIES: No edema, no skin discoloration or tenderness.  No palpable cords. LYMPH NODES: No palpable cervical, supraclavicular, axillary or inguinal adenopathy  NEUROLOGICAL: Unremarkable. PSYCH:  Appropriate.   No visits with results within 3 Day(s) from this visit.  Latest known visit with results is:  Office Visit on 01/13/2017  Component Date Value Ref Range Status  . Sodium 01/13/2017 137  135 - 145 mmol/L Final  . Potassium 01/13/2017 3.3* 3.5 - 5.1 mmol/L Final  . Chloride 01/13/2017 96* 101 - 111 mmol/L Final  . CO2 01/13/2017 32  22 - 32 mmol/L Final  . Glucose, Bld  01/13/2017 96  65 - 99 mg/dL Final  . BUN 01/13/2017 12  6 - 20 mg/dL Final  . Creatinine, Ser 01/13/2017 0.76  0.44 - 1.00 mg/dL Final  . Calcium 01/13/2017 8.9  8.9 - 10.3 mg/dL Final  . GFR calc non Af Amer 01/13/2017 >60  >60 mL/min Final  . GFR calc Af Amer 01/13/2017 >60  >60 mL/min Final   Comment: (NOTE) The eGFR has been calculated using the CKD EPI equation. This calculation has not been validated in all clinical situations. eGFR's persistently <60 mL/min signify possible Chronic Kidney Disease.   . Anion gap 01/13/2017 9  5 - 15 Final    Assessment:  Tracey White is a 37 y.o. female ***  Plan: 1.   2.   3.   4.    Lequita Asal, MD  03/19/2017, 5:02 AM   I saw and evaluated the patient, participating in the key portions of the service and reviewing pertinent diagnostic studies and records.  I reviewed the nurse practitioner's note and agree with the findings and the plan.  The assessment and plan were discussed with the patient.  Additional diagnostic studies of *** are needed to clarify *** and would change the clinical management.  A few ***multiple questions were asked by the patient and answered.   Nolon Stalls, MD 03/19/2017,5:02 AM

## 2017-03-19 NOTE — Progress Notes (Signed)
Patient here today as new evaluation regarding anemia.  Referred by Dr. Lucy ChrisLindsey Goeres. States she is very SOB  O2 94%.

## 2017-03-20 LAB — VITAMIN B12: Vitamin B-12: 210 pg/mL (ref 180–914)

## 2017-03-24 ENCOUNTER — Emergency Department: Payer: Medicaid Other

## 2017-03-24 ENCOUNTER — Emergency Department
Admission: EM | Admit: 2017-03-24 | Discharge: 2017-03-24 | Disposition: A | Discharge status: Home / Self Care | Payer: Medicaid Other | Attending: Emergency Medicine | Admitting: Emergency Medicine

## 2017-03-24 ENCOUNTER — Encounter: Payer: Self-pay | Admitting: Emergency Medicine

## 2017-03-24 DIAGNOSIS — I5032 Chronic diastolic (congestive) heart failure: Secondary | ICD-10-CM | POA: Insufficient documentation

## 2017-03-24 DIAGNOSIS — Z79899 Other long term (current) drug therapy: Secondary | ICD-10-CM | POA: Diagnosis not present

## 2017-03-24 DIAGNOSIS — R05 Cough: Secondary | ICD-10-CM | POA: Diagnosis present

## 2017-03-24 DIAGNOSIS — J40 Bronchitis, not specified as acute or chronic: Secondary | ICD-10-CM | POA: Insufficient documentation

## 2017-03-24 DIAGNOSIS — Z87891 Personal history of nicotine dependence: Secondary | ICD-10-CM | POA: Insufficient documentation

## 2017-03-24 LAB — TROPONIN I

## 2017-03-24 LAB — COMPREHENSIVE METABOLIC PANEL
ALBUMIN: 3.5 g/dL (ref 3.5–5.0)
ALK PHOS: 110 U/L (ref 38–126)
ALT: 11 U/L — ABNORMAL LOW (ref 14–54)
ANION GAP: 8 (ref 5–15)
AST: 15 U/L (ref 15–41)
BUN: 10 mg/dL (ref 6–20)
CALCIUM: 8.7 mg/dL — AB (ref 8.9–10.3)
CO2: 27 mmol/L (ref 22–32)
Chloride: 99 mmol/L — ABNORMAL LOW (ref 101–111)
Creatinine, Ser: 0.74 mg/dL (ref 0.44–1.00)
GFR calc non Af Amer: >60 mL/min (ref >60)
GLUCOSE: 100 mg/dL — AB (ref 65–99)
POTASSIUM: 3.3 mmol/L — AB (ref 3.5–5.1)
SODIUM: 134 mmol/L — AB (ref 135–145)
TOTAL PROTEIN: 8.1 g/dL (ref 6.5–8.1)
Total Bilirubin: 0.8 mg/dL (ref 0.3–1.2)

## 2017-03-24 LAB — CBC WITH DIFFERENTIAL/PLATELET
BASOS PCT: 1 %
Basophils Absolute: 0.1 10*3/uL (ref 0–0.1)
EOS ABS: 0.2 10*3/uL (ref 0–0.7)
EOS PCT: 1 %
HCT: 33.5 % — ABNORMAL LOW (ref 35.0–47.0)
Hemoglobin: 10.2 g/dL — ABNORMAL LOW (ref 12.0–16.0)
LYMPHS ABS: 2 10*3/uL (ref 1.0–3.6)
Lymphocytes Relative: 17 %
MCH: 22 pg — AB (ref 26.0–34.0)
MCHC: 30.6 g/dL — AB (ref 32.0–36.0)
MCV: 71.9 fL — ABNORMAL LOW (ref 80.0–100.0)
MONO ABS: 0.7 10*3/uL (ref 0.2–0.9)
MONOS PCT: 5 %
Neutro Abs: 9.2 10*3/uL — ABNORMAL HIGH (ref 1.4–6.5)
Neutrophils Relative %: 76 %
Platelets: 280 10*3/uL (ref 150–440)
RBC: 4.66 MIL/uL (ref 3.80–5.20)
RDW: 19.9 % — ABNORMAL HIGH (ref 11.5–14.5)
WBC: 12.2 10*3/uL — ABNORMAL HIGH (ref 3.6–11.0)

## 2017-03-24 LAB — BRAIN NATRIURETIC PEPTIDE: B Natriuretic Peptide: 17 pg/mL (ref 0.0–100.0)

## 2017-03-24 IMAGING — CR DG CHEST 2V
1 series · 2 of 2 positions shown · non-contrast
Comparison: [DATE]

CLINICAL DATA: Shortness of breath and cough

EXAM:
CHEST  2 VIEW

[Series 1: dg chest 2 view · 0.14mm/px · 2 of 2 slices shown]
[im 1/2]
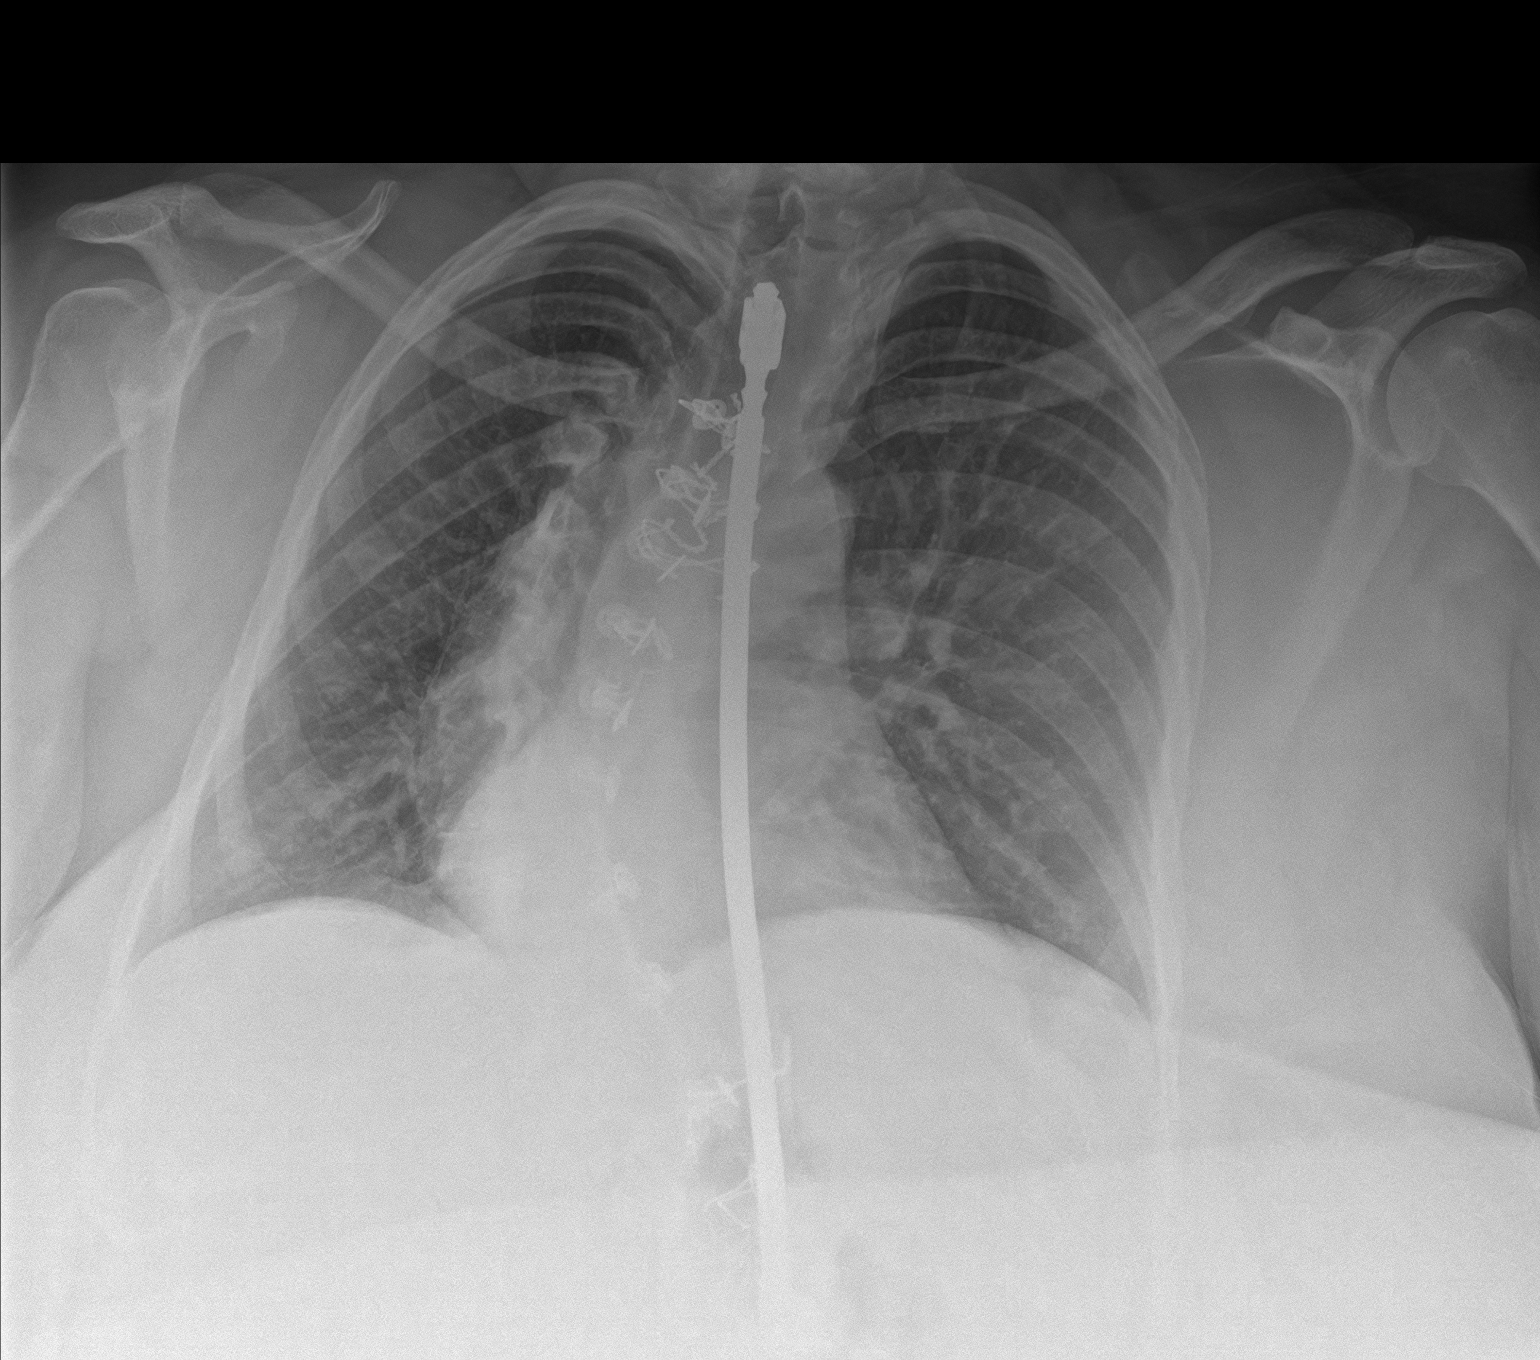
[im 2/2]
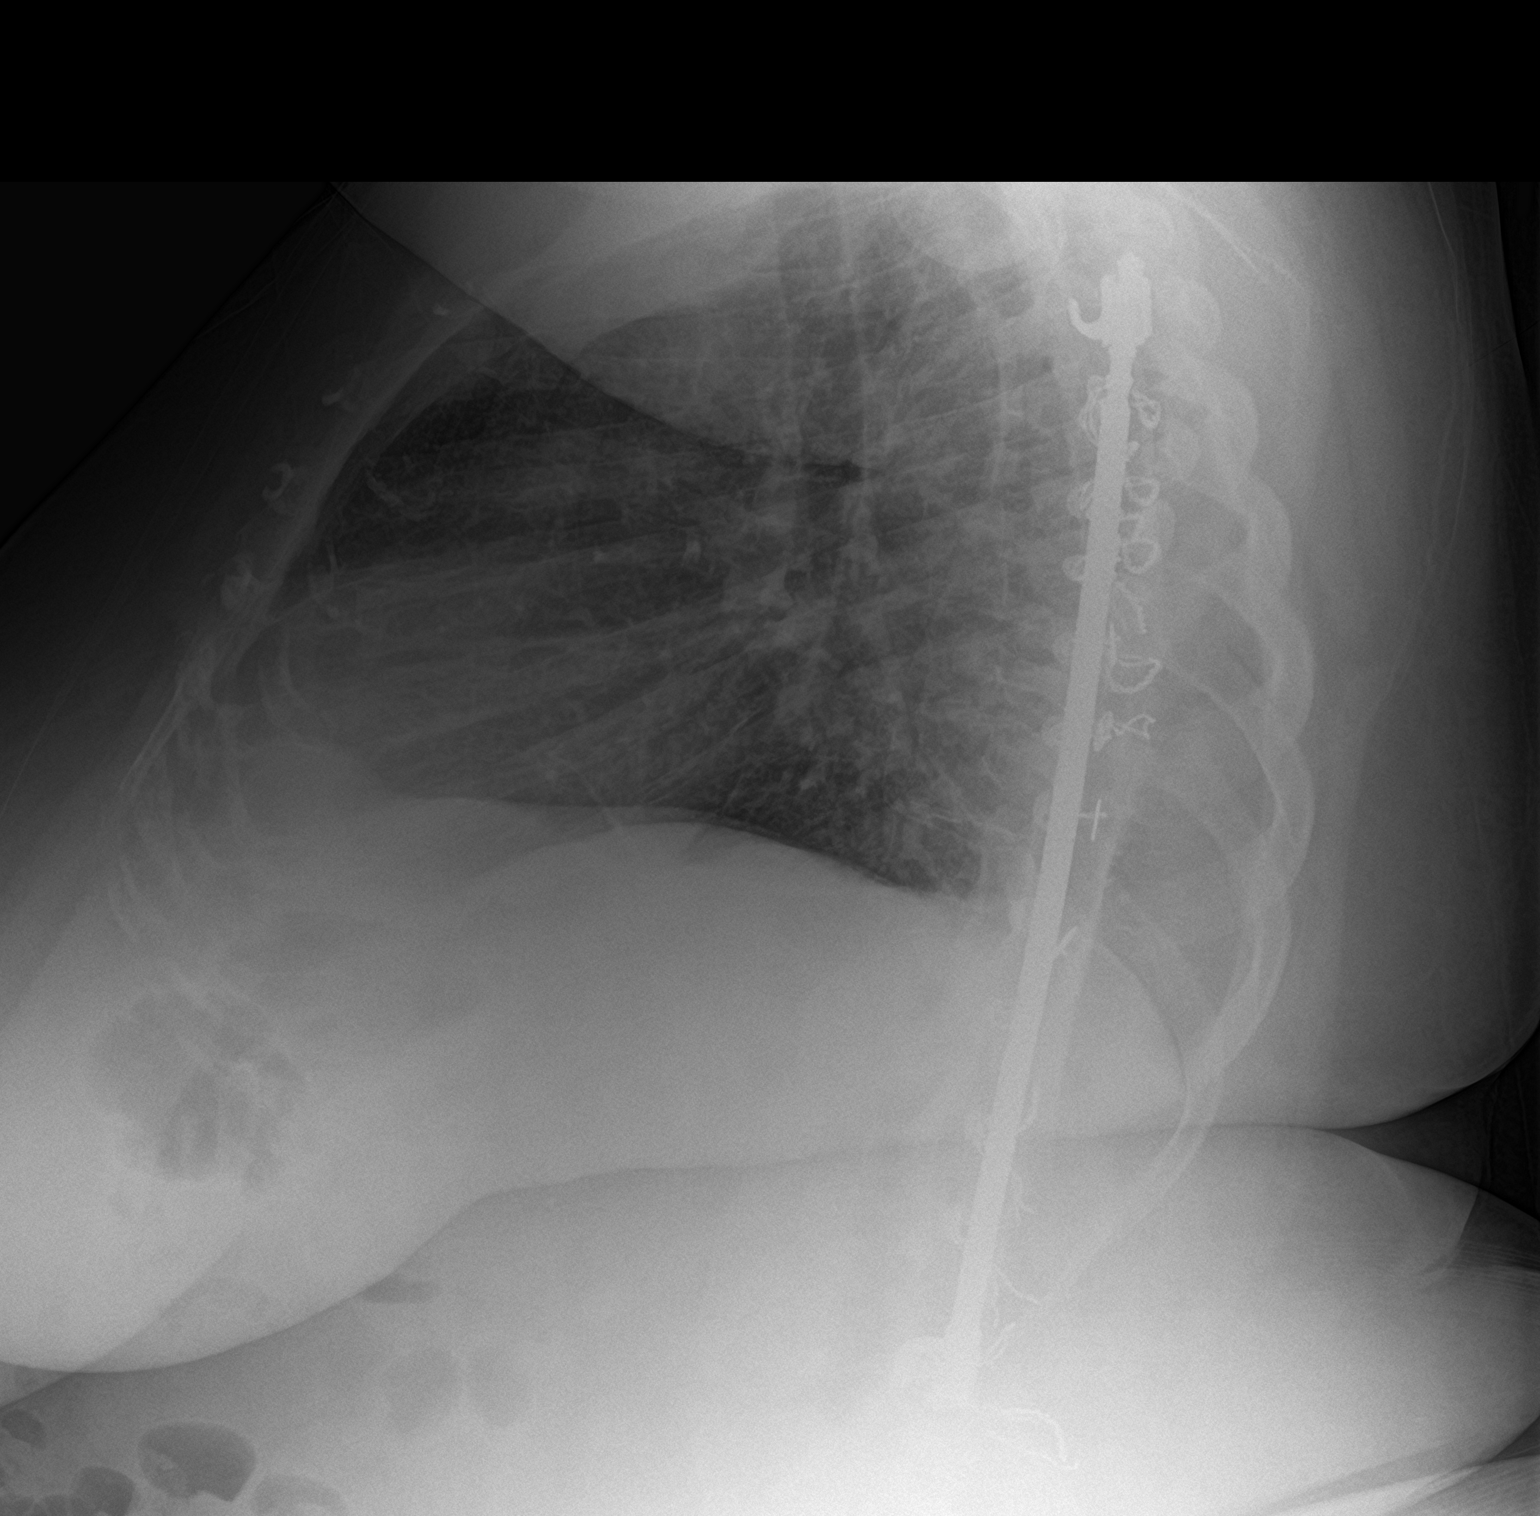

[2 of 2 positions shown; findings below may reference images not displayed]

FINDINGS: There is no edema or consolidation. Heart size and pulmonary
vascularity are normal. No adenopathy. Patient is status post
fixation rod placement for lower thoracic dextroscoliosis.
IMPRESSION: No edema or consolidation.  Stable cardiac silhouette.

## 2017-03-24 MED ORDER — AZITHROMYCIN 250 MG PO TABS: ORAL_TABLET | ORAL | 0 refills | Status: DC

## 2017-03-24 MED ORDER — PREDNISONE 10 MG PO TABS: 50.0000 mg | ORAL_TABLET | Freq: Every day | ORAL | 0 refills | Status: DC

## 2017-03-24 MED ORDER — ALBUTEROL SULFATE HFA 108 (90 BASE) MCG/ACT IN AERS: 2.0000 | INHALATION_SPRAY | RESPIRATORY_TRACT | 1 refills | Status: DC | PRN

## 2017-03-24 NOTE — ED Provider Notes (Signed)
ED ECG REPORT I, Nita Sicklearolina Doniel Maiello, the attending physician, personally viewed and interpreted this ECG.  normal sinus rhythm, rate of 92, normal intervals, normal axis, no ST elevations or depressions, normal EKG.   Don PerkingVeronese, [place], MD 03/24/17 1248

## 2017-03-24 NOTE — ED Provider Notes (Signed)
[facility] Emergency Department Provider Note  ____________________________________________  Time seen: Approximately 12:31 PM  I have reviewed the triage vital signs and the nursing notes.   HISTORY  Chief Complaint Cough and Shortness of Breath   HPI Tracey White is a 37 y.o. female who presents to the emergency department for treatment and evaluation of productive cough and shortness of breath.  Shortness of breath is worse with exertion.  Chest hurts when she coughs.  She has not had a fever that she knows of.  She has a significant past medical history of congestive heart failure, obesity, and sleep apnea.  Patient also states that she recently recovered from influenza.  Past Medical History:  Diagnosis Date  . Anxiety   . CHF (congestive heart failure) (HCC)   . Chronic heart failure (HCC)   . Morbid obesity (HCC)   . OSA (obstructive sleep apnea)    uses CPAP at home  . Peripheral edema   . Scoliosis     Patient Active Problem List   Diagnosis Date Noted  . Iron deficiency anemia 03/19/2017  . ANA positive 02/04/2017  . Positive antinuclear antibody 02/04/2017  . 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 Pharmacologic therapy 01/08/2017  . Problems influencing health status 01/08/2017  . Chronic lower extremity pain [facility] Area of Pain) (Bilateral) (R>L) 01/08/2017  . Elevated C-reactive protein (CRP) 01/08/2017  . Elevated sed rate 01/08/2017  . Vitamin D deficiency 01/08/2017  . 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 Acute diastolic heart failure (HCC) 01/01/2017  . Anemia 12/25/2016  . Asthma without status asthmaticus 12/24/2016  . Chronic knee pain (Secondary Area of Pain) (Bilateral) (R>L) 12/12/2016  . Chronic thoracic back pain (Fourth Area of Pain) (Bilateral) (R>L) 12/12/2016  . Chronic pain  syndrome 12/12/2016  . Disorder of skeletal system 12/12/2016  . Other long term (current) drug therapy 12/12/2016  . Scoliosis 12/12/2016  . Lymphedema 09/24/2016  . Skin lesion of left leg 09/05/2016  . Depression 08/27/2016  . Chronic low back pain (Primary Area of Pain) (Bilateral) (L>R) 08/27/2016  . Hypokalemia 06/06/2016  . Chest pain 05/31/2016  . Chronic diastolic heart failure (HCC) 05/15/2016  . Tachycardia 05/15/2016  . Obstructive sleep apnea on CPAP 05/15/2016  . Tobacco use 05/15/2016  . Chest pain at rest 10/18/2015    Past Surgical History:  Procedure Laterality Date  . HERNIA REPAIR    . scoliosis repair    . TUBAL LIGATION      Prior to Admission medications   Medication Sig Start Date End Date Taking? Authorizing Provider  albuterol (PROVENTIL HFA;VENTOLIN HFA) 108 (90 Base) MCG/ACT inhaler Inhale 2 puffs into the lungs every 4 (four) hours as needed for wheezing or shortness of breath. 03/24/17   Zebbie Ace B, FNP  ARIPiprazole (ABILIFY) 2 MG tablet Take 4 mg by mouth daily.     [provider]  azithromycin (ZITHROMAX) 250 MG tablet 2 tablets today, then 1 tablet for the next 4 days. 03/24/17   Theresea Trautmann, Rulon Eisenmenger B, FNP  budesonide-formoterol (SYMBICORT) 160-4.5 MCG/ACT inhaler Inhale 2 puffs into the lungs 2 (two) times daily.    [provider]  bumetanide (BUMEX) 2 MG tablet Take 2-4 mg by mouth See admin instructions. 4 mg in the AM and 2 mg in the PM    [provider]  buPROPion (  WELLBUTRIN) 75 MG tablet Take 75 mg by mouth daily.    [provider]  Calcium Carb-Cholecalciferol (CALCIUM PLUS D3 ABSORBABLE) [phone] MG-UNIT CAPS Take 1 capsule by mouth daily with breakfast. 02/04/17 05/05/17  Barbette Merino, NP  Cholecalciferol 5000 units capsule Take 1 capsule (5,000 Units total) by mouth daily. 02/04/17 05/05/17  Barbette Merino, NP  diclofenac sodium (VOLTAREN) 1 % GEL Apply 4 (four) times daily topically.    [provider]  diltiazem (CARDIZEM) 120 MG tablet Take 120 mg daily by mouth.    [provider]  escitalopram (LEXAPRO) 20 MG tablet Take 20 mg by mouth daily.     [provider]  gabapentin (NEURONTIN) 100 MG capsule Take 100 mg by mouth 3 (three) times daily.     [provider]  lidocaine (LIDODERM) 5 % Place 1 patch daily onto the skin. Remove & Discard patch within 12 hours or as directed by MD    [provider]  losartan (COZAAR) 25 MG tablet Take 25 mg by mouth daily.    [provider]  meloxicam (MOBIC) 15 MG tablet Take 15 mg by mouth daily as needed for pain.    [provider]  metolazone (ZAROXOLYN) 2.5 MG tablet Take 1 tablet (2.5 mg total) by mouth daily. 01/01/17 04/01/17  Delma Freeze, FNP  metoprolol succinate (TOPROL-XL) 25 MG 24 hr tablet Take 2 tablets daily by mouth.  10/30/15   [provider]  montelukast (SINGULAIR) 10 MG tablet Take 10 mg by mouth at bedtime.    [provider]  potassium chloride SA (K-DUR,KLOR-CON) 20 MEQ tablet Take 40 mEq by mouth 2 (two) times daily.    [provider]  predniSONE (DELTASONE) 10 MG tablet Take 5 tablets (50 mg total) by mouth daily. 03/24/17   Euline Kimbler, Rulon Eisenmenger B, FNP  traMADol (ULTRAM) 50 MG tablet Take 1 tablet (50 mg total) by mouth every 8 (eight) hours as needed for severe pain. 03/06/17 06/04/17  Barbette Merino, NP    Allergies Patient has no known allergies.  Family History  Problem Relation Age of Onset  . Diabetes Mother   . Hypertension Mother   . Cancer Maternal Grandmother     Social History Social History   Tobacco Use  . Smoking status: Former Smoker    Packs/day: 0.20    Types: Cigarettes    Last attempt to quit: 02/16/2017    Years since quitting: 0.0  . Smokeless tobacco: Never Used  . Tobacco comment: hasn't smoked in 2 weeks  Substance Use Topics  . Alcohol use: Yes    Alcohol/week: 0.6 - 1.2 oz    Types: 1 - 2 Standard  drinks or equivalent per week    Comment: occasionally  . Drug use: No    Review of Systems Constitutional: Negative for fever/chills ENT: No sore throat. Cardiovascular: Positive chest pain. Respiratory: Positive for shortness of breath.  Positive for cough. Gastrointestinal: Negative for nausea, no vomiting.  Negative for diarrhea.  Musculoskeletal: Negative for body aches Skin: Negative for rash. Neurological: Negative for headaches ____________________________________________   PHYSICAL EXAM:  VITAL SIGNS: ED Triage Vitals  Enc Vitals Group     BP 03/24/17 1133 134/79     Pulse Rate 03/24/17 1133 99     Resp 03/24/17 1133 (!) 24     Temp 03/24/17 1133 98.3 F (36.8 C)     Temp Source 03/24/17 1133 Oral     SpO2 03/24/17  1133 97 %     Weight 03/24/17 1123 (!) 348 lb (157.9 kg)     Height 03/24/17 1123 5\' 1"  (1.549 m)     Head Circumference --      Peak Flow --      Pain Score 03/24/17 1122 5     Pain Loc --      Pain Edu? --      Excl. in GC? --     Constitutional: Alert and oriented.  Acutely ill appearing and in no acute distress. Eyes: Conjunctivae are normal. EOMI. Ears: Bilateral tympanic membranes are normal Nose: No sinus congestion noted; no rhinnorhea. Mouth/Throat: Mucous membranes are moist.  Oropharynx clear. Tonsils 1+ without exudate. Neck: No stridor.  Lymphatic: No cervical lymphadenopathy. Cardiovascular: Normal rate, regular rhythm. Good peripheral circulation.  No S3 auscultated Respiratory: Normal respiratory effort.  No retractions.  Breath sounds clear to auscultation throughout.. Gastrointestinal: Soft and nontender.  Musculoskeletal: FROM x 4 extremities.  Neurologic:  Normal speech and language.  Skin:  Skin is warm, dry and intact. No rash noted. Psychiatric: Mood and affect are normal. Speech and behavior are normal.  ____________________________________________   LABS (all labs ordered are listed, but only abnormal results are  displayed)  Labs Reviewed  CBC WITH DIFFERENTIAL/PLATELET - Abnormal; Notable for the following components:      Result Value   WBC 12.2 (*)    Hemoglobin 10.2 (*)    HCT 33.5 (*)    MCV 71.9 (*)    MCH 22.0 (*)    MCHC 30.6 (*)    RDW 19.9 (*)    Neutro Abs 9.2 (*)    All other components within normal limits  COMPREHENSIVE METABOLIC PANEL - Abnormal; Notable for the following components:   Sodium 134 (*)    Potassium 3.3 (*)    Chloride 99 (*)    Glucose, Bld 100 (*)    Calcium 8.7 (*)    ALT 11 (*)    All other components within normal limits  BRAIN NATRIURETIC PEPTIDE  TROPONIN I   ____________________________________________  EKG  Normal sinus rhythm with a rate of 92, no ectopy, normal axis. ____________________________________________  RADIOLOGY  Chest x-ray negative for acute cardiopulmonary abnormality per radiology. ____________________________________________   PROCEDURES  Procedure(s) performed: None  Critical Care performed: No ____________________________________________   INITIAL IMPRESSION / ASSESSMENT AND PLAN / ED COURSE  37 year old female presenting to the emergency department for treatment and evaluation of productive cough and shortness of breath that seems to be worse with exertion after having influenza a couple of weeks ago.  She has no known fever.  Symptoms, labs, x-ray, and exam are consistent with an acute bronchitis for which she will be treated with azithromycin, albuterol, prednisone, and encouraged to follow-up with her primary care provider if not improving over the next few days.  She was instructed to return to the emergency department for symptoms of change or worsen if she is unable to schedule appointment.  She was also encouraged to keep her scheduled appointments for her iron infusions.  Medications - No data to display  ED Discharge Orders        Ordered    predniSONE (DELTASONE) 10 MG tablet  Daily     03/24/17 1550     albuterol (PROVENTIL HFA;VENTOLIN HFA) 108 (90 Base) MCG/ACT inhaler  Every 4 hours PRN     03/24/17 1550    azithromycin (ZITHROMAX) 250 MG tablet     03/24/17 1550  Pertinent labs & imaging results that were available during my care of the patient were reviewed by me and considered in my medical decision making (see chart for details).    If controlled substance prescribed during this visit, 12 month history viewed on the [facility] prior to issuing an initial prescription for Schedule II or III opiod. ____________________________________________   FINAL CLINICAL IMPRESSION(S) / ED DIAGNOSES  Final diagnoses:  Bronchitis    Note:  This document was prepared using Dragon voice recognition software and may include unintentional dictation errors.     Chinita Pester, FNP 03/24/17 1632    Governor Rooks, MD 03/27/17 [phone]

## 2017-03-24 NOTE — Discharge Instructions (Signed)
Follow up with your primary care provider for symptoms that are not improving over the next few days.  Return to the ER for symptoms that return to the ER for symptoms that change or worsen if unable to schedule an appointment.

## 2017-03-24 NOTE — ED Notes (Signed)
Pt discharged to home.  Friend driving.  Discharge instructions reviewed.  Verbalized understanding.  No questions or concerns at this time.  Teach back verified.  Pt in NAD.  No items left in ED.   

## 2017-03-24 NOTE — ED Triage Notes (Signed)
Presents with a 3 day period of prod cough and SOB  States SOB is worse with exertion  Also having some discomfort in chest with cough  Denies any fever

## 2017-03-26 ENCOUNTER — Ambulatory Visit
Admission: EM | Admit: 2017-03-26 | Discharge: 2017-03-26 | Disposition: A | Discharge status: Home / Self Care | Payer: Medicaid Other | Attending: Family Medicine | Admitting: Family Medicine

## 2017-03-26 ENCOUNTER — Encounter: Payer: Self-pay | Admitting: Emergency Medicine

## 2017-03-26 ENCOUNTER — Other Ambulatory Visit: Payer: Self-pay

## 2017-03-26 DIAGNOSIS — Z87891 Personal history of nicotine dependence: Secondary | ICD-10-CM | POA: Insufficient documentation

## 2017-03-26 DIAGNOSIS — R05 Cough: Secondary | ICD-10-CM | POA: Diagnosis not present

## 2017-03-26 DIAGNOSIS — Z79899 Other long term (current) drug therapy: Secondary | ICD-10-CM | POA: Diagnosis not present

## 2017-03-26 DIAGNOSIS — Z20828 Contact with and (suspected) exposure to other viral communicable diseases: Secondary | ICD-10-CM

## 2017-03-26 LAB — RAPID INFLUENZA A&B ANTIGENS
Influenza A (ARMC): NEGATIVE
Influenza B (ARMC): NEGATIVE

## 2017-03-26 MED ORDER — OSELTAMIVIR PHOSPHATE 75 MG PO CAPS: 75.0000 mg | ORAL_CAPSULE | Freq: Every day | ORAL | 0 refills | Status: DC

## 2017-03-26 NOTE — ED Triage Notes (Signed)
Patient in today c/o cough, sob, wheezing. Patient was seen at [facility] ED on 03/24/17 and diagnosed with bronchitis. Patient is on Zpak and Prednisone. Patient's daughter has the flu and wants to be checked for flu.

## 2017-03-26 NOTE — ED Provider Notes (Signed)
[facility]    CSN: [id number] Arrival date & time: 03/26/17  1330     History   Chief Complaint Chief Complaint  Patient presents with  . Cough    HPI Morene RankinsRose A Hill is a 37 y.o. female.   37 yo female with a c/o exposure to flu. Patient has been sick recently, was seen in ED 2 days ago and diagnosed with bronchitis. States today her 309 yo daughter was diagnosed with the flu. Patient denies any fevers or chills.    The history is provided by the patient.  Cough    Past Medical History:  Diagnosis Date  . Anxiety   . CHF (congestive heart failure) (HCC)   . Chronic heart failure (HCC)   . Morbid obesity (HCC)   . OSA (obstructive sleep apnea)    uses CPAP at home  . Peripheral edema   . Scoliosis     Patient Active Problem List   Diagnosis Date Noted  . Iron deficiency anemia 03/19/2017  . ANA positive 02/04/2017  . Positive antinuclear antibody 02/04/2017  . 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 Pharmacologic therapy 01/08/2017  . Problems influencing health status 01/08/2017  . Chronic lower extremity pain [facility](Tertiary Area of Pain) (Bilateral) (R>L) 01/08/2017  . Elevated C-reactive protein (CRP) 01/08/2017  . Elevated sed rate 01/08/2017  . Vitamin D deficiency 01/08/2017  . 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 Acute diastolic heart failure (HCC) 01/01/2017  . Anemia 12/25/2016  . Asthma without status asthmaticus 12/24/2016  . Chronic knee pain (Secondary Area of Pain) (Bilateral) (R>L) 12/12/2016  . Chronic thoracic back pain (Fourth Area of Pain) (Bilateral) (R>L) 12/12/2016  . Chronic pain syndrome 12/12/2016  . Disorder of skeletal system 12/12/2016  . Other long term (current) drug therapy 12/12/2016  . Scoliosis 12/12/2016  . Lymphedema 09/24/2016  . Skin lesion of left leg 09/05/2016  . Depression  08/27/2016  . Chronic low back pain (Primary Area of Pain) (Bilateral) (L>R) 08/27/2016  . Hypokalemia 06/06/2016  . Chest pain 05/31/2016  . Chronic diastolic heart failure (HCC) 05/15/2016  . Tachycardia 05/15/2016  . Obstructive sleep apnea on CPAP 05/15/2016  . Tobacco use 05/15/2016  . Chest pain at rest 10/18/2015    Past Surgical History:  Procedure Laterality Date  . HERNIA REPAIR    . scoliosis repair    . TUBAL LIGATION      OB History    No data available       Home Medications    Prior to Admission medications   Medication Sig Start Date End Date Taking? Authorizing Provider  albuterol (PROVENTIL HFA;VENTOLIN HFA) 108 (90 Base) MCG/ACT inhaler Inhale 2 puffs into the lungs every 4 (four) hours as needed for wheezing or shortness of breath. 03/24/17  Yes Triplett, Cari B, FNP  ARIPiprazole (ABILIFY) 2 MG tablet Take 4 mg by mouth daily.    Yes [provider]  azithromycin (ZITHROMAX) 250 MG tablet 2 tablets today, then 1 tablet for the next 4 days. 03/24/17  Yes Triplett, Cari B, FNP  budesonide-formoterol (SYMBICORT) 160-4.5 MCG/ACT inhaler Inhale 2 puffs into the lungs 2 (two) times daily.   Yes [provider]  bumetanide (BUMEX) 2 MG tablet Take 2-4 mg by mouth See admin instructions. 4 mg in the AM and 2 mg in the PM   Yes [provider]  buPROPion (WELLBUTRIN) 75 MG tablet Take 75 mg by mouth daily.   Yes [provider]  Calcium Carb-Cholecalciferol (CALCIUM PLUS D3 ABSORBABLE) [phone] MG-UNIT CAPS Take 1 capsule by mouth daily with breakfast. 02/04/17 05/05/17 Yes Barbette Merino, NP  Cholecalciferol 5000 units capsule Take 1 capsule (5,000 Units total) by mouth daily. 02/04/17 05/05/17 Yes Barbette Merino, NP  diclofenac sodium (VOLTAREN) 1 % GEL Apply 4 (four) times daily topically.   Yes [provider]  diltiazem (CARDIZEM) 120 MG tablet Take 120 mg daily by mouth.   Yes [provider]  escitalopram (LEXAPRO)  20 MG tablet Take 20 mg by mouth daily.    Yes [provider]  gabapentin (NEURONTIN) 100 MG capsule Take 100 mg by mouth 3 (three) times daily.    Yes [provider]  lidocaine (LIDODERM) 5 % Place 1 patch daily onto the skin. Remove & Discard patch within 12 hours or as directed by MD   Yes [provider]  losartan (COZAAR) 25 MG tablet Take 25 mg by mouth daily.   Yes [provider]  meloxicam (MOBIC) 15 MG tablet Take 15 mg by mouth daily as needed for pain.   Yes [provider]  metolazone (ZAROXOLYN) 2.5 MG tablet Take 1 tablet (2.5 mg total) by mouth daily. 01/01/17 04/01/17 Yes Clarisa Kindred A, FNP  metoprolol succinate (TOPROL-XL) 25 MG 24 hr tablet Take 2 tablets daily by mouth.  10/30/15  Yes [provider]  montelukast (SINGULAIR) 10 MG tablet Take 10 mg by mouth at bedtime.   Yes [provider]  potassium chloride SA (K-DUR,KLOR-CON) 20 MEQ tablet Take 40 mEq by mouth 2 (two) times daily.   Yes [provider]  predniSONE (DELTASONE) 10 MG tablet Take 5 tablets (50 mg total) by mouth daily. 03/24/17  Yes Triplett, Cari B, FNP  traMADol (ULTRAM) 50 MG tablet Take 1 tablet (50 mg total) by mouth every 8 (eight) hours as needed for severe pain. 03/06/17 06/04/17 Yes Barbette Merino, NP  oseltamivir (TAMIFLU) 75 MG capsule Take 1 capsule (75 mg total) by mouth daily. 03/26/17   Payton Mccallum, MD    Family History Family History  Problem Relation Age of Onset  . Diabetes Mother   . Hypertension Mother   . Cancer Maternal Grandmother     Social History Social History   Tobacco Use  . Smoking status: Former Smoker    Packs/day: 0.20    Types: Cigarettes    Last attempt to quit: 02/16/2017    Years since quitting: 0.1  . Smokeless tobacco: Never Used  . Tobacco comment: hasn't smoked in 2 weeks  Substance Use Topics  . Alcohol use: Yes    Alcohol/week: 0.6 - 1.2 oz    Types: 1 - 2 Standard drinks or  equivalent per week    Comment: occasionally  . Drug use: No     Allergies   Patient has no known allergies.   Review of Systems Review of Systems  Respiratory: Positive for cough.      Physical Exam Triage Vital Signs ED Triage Vitals [03/26/17 1359]  Enc Vitals Group     BP (!) 148/89     Pulse Rate (!) 102     Resp 20     Temp 98.1 F (36.7 C)     Temp Source Oral     SpO2 99 %     Weight (!) 348 lb (157.9 kg)  Height 5\' 1"  (1.549 m)     Head Circumference      Peak Flow      Pain Score 7     Pain Loc      Pain Edu?      Excl. in GC?    No data found.  Updated Vital Signs BP (!) 148/89 (BP Location: Left Arm)   Pulse (!) 102   Temp 98.1 F (36.7 C) (Oral)   Resp 20   Ht 5\' 1"  (1.549 m)   Wt (!) 348 lb (157.9 kg)   LMP 02/26/2017 (Exact Date)   SpO2 99%   BMI 65.75 kg/m   Visual Acuity Right Eye Distance:   Left Eye Distance:   Bilateral Distance:    Right Eye Near:   Left Eye Near:    Bilateral Near:     Physical Exam  Constitutional: She appears well-developed and well-nourished. No distress.  HENT:  Head: Normocephalic and atraumatic.  Right Ear: Tympanic membrane, external ear and ear canal normal.  Left Ear: Tympanic membrane, external ear and ear canal normal.  Nose: No mucosal edema, rhinorrhea, nose lacerations, sinus tenderness, nasal deformity, septal deviation or nasal septal hematoma. No epistaxis.  No foreign bodies. Right sinus exhibits no frontal sinus tenderness. Left sinus exhibits no maxillary sinus tenderness and no frontal sinus tenderness.  Mouth/Throat: Uvula is midline, oropharynx is clear and moist and mucous membranes are normal. No oropharyngeal exudate.  Eyes: Conjunctivae and EOM are normal. Pupils are equal, round, and reactive to light. Right eye exhibits no discharge. Left eye exhibits no discharge. No scleral icterus.  Neck: Normal range of motion. Neck supple. No thyromegaly present.  Cardiovascular: Normal  rate, regular rhythm and normal heart sounds.  Pulmonary/Chest: Effort normal and breath sounds normal. No respiratory distress. She has no wheezes. She has no rales.  Lymphadenopathy:    She has no cervical adenopathy.  Skin: She is not diaphoretic.  Nursing note and vitals reviewed.    UC Treatments / Results  Labs (all labs ordered are listed, but only abnormal results are displayed) Labs Reviewed  RAPID INFLUENZA A&B ANTIGENS ([facility] ONLY)    EKG  EKG Interpretation None       Radiology No results found.  Procedures Procedures (including critical care time)  Medications Ordered in UC Medications - No data to display   Initial Impression / Assessment and Plan / UC Course  I have reviewed the triage vital signs and the nursing notes.  Pertinent labs & imaging results that were available during my care of the patient were reviewed by me and considered in my medical decision making (see chart for details).       Final Clinical Impressions(s) / UC Diagnoses   Final diagnoses:  Exposure to influenza    ED Discharge Orders        Ordered    oseltamivir (TAMIFLU) 75 MG capsule  Daily     03/26/17 1459     1. diagnosis reviewed with patient 2. rx as per orders above; reviewed possible side effects, interactions, risks and benefits  3. Recommend supportive treatment with rest, fluids; continue current medications prescribed 2 days ago in ED for bronchitis 4. Follow-up prn if symptoms worsen or don't improve  Controlled Substance Prescriptions [place] Controlled Substance Registry consulted? Not Applicable   Payton Mccallum, MD 03/26/17 [phone]

## 2017-04-02 DIAGNOSIS — I5032 Chronic diastolic (congestive) heart failure: Secondary | ICD-10-CM | POA: Insufficient documentation

## 2017-04-06 ENCOUNTER — Encounter: Payer: Self-pay | Admitting: Hematology and Oncology

## 2017-04-08 ENCOUNTER — Ambulatory Visit: Payer: Self-pay | Admitting: Family

## 2017-04-08 NOTE — Progress Notes (Deleted)
Patient ID: Tracey White, female    DOB: 1980-10-30, 37 y.o.   MRN: [id number]  HPI  Ms Eschete is a 37 y/o female with a history of anxiety, obstructive sleep apnea (with nasal CPAP), scoliosis, recent tobacco use and chronic heart failure.   Reviewed last echo report done 10/19/15 which showed an EF of 65-70% along with trivial TR.   Was at Urgent Care 03/26/17 due to a cough and influenza exposure. Tested negative for influenza but was treated with tamiflu due to exposure. Was in the ED 03/24/17 due to bronchitis. Treated with antibiotics, steroids and nebulizer and released. Was in the ED 12/30/16 due to syncope where she was treated and released. Admitted 12/24/16 due to asthma exacerbation/bronchitis. Was given solumedrol, levaquin and nebulizers. Discharged home after 2 days with an extended prednisone taper. Was in the ED 12/05/16 due to worsening back pain and HF. Was treated and released. Presented to the ED 10/26/16 and left without being seen.   She presents today for a follow-up visit with a chief complaint of   Past Medical History:  Diagnosis Date  . Anxiety   . CHF (congestive heart failure) (HCC)   . Chronic heart failure (HCC)   . Morbid obesity (HCC)   . OSA (obstructive sleep apnea)    uses CPAP at home  . Peripheral edema   . Scoliosis    Past Surgical History:  Procedure Laterality Date  . HERNIA REPAIR    . scoliosis repair    . TUBAL LIGATION     Family History  Problem Relation Age of Onset  . Diabetes Mother   . Hypertension Mother   . Cancer Maternal Grandmother    Social History   Tobacco Use  . Smoking status: Former Smoker    Packs/day: 0.20    Types: Cigarettes    Last attempt to quit: 02/16/2017    Years since quitting: 0.1  . Smokeless tobacco: Never Used  . Tobacco comment: hasn't smoked in 2 weeks  Substance Use Topics  . Alcohol use: Yes    Alcohol/week: 0.6 - 1.2 oz    Types: 1 - 2 Standard drinks or equivalent per week    Comment:  occasionally   No Known Allergies   Review of Systems  Constitutional: Positive for fatigue (better with CPAP). Negative for appetite change.  HENT: Negative for congestion, postnasal drip and sore throat.   Eyes: Negative.   Respiratory: Positive for cough and shortness of breath. Negative for chest tightness and wheezing.   Cardiovascular: Negative for chest pain, palpitations and leg swelling.  Gastrointestinal: Negative for abdominal distention and abdominal pain.  Endocrine: Negative.   Genitourinary: Negative.   Musculoskeletal: Positive for arthralgias (knees) and back pain.  Skin: Negative.   Allergic/Immunologic: Negative.   Neurological: Negative for dizziness, weakness and light-headedness.  Hematological: Negative for adenopathy. Does not bruise/bleed easily.  Psychiatric/Behavioral: Positive for dysphoric mood. Negative for sleep disturbance (has resumed wearing CPAP) and suicidal ideas. The patient is not nervous/anxious.      Physical Exam  Constitutional: She is oriented to person, place, and time. She appears well-developed and well-nourished.  HENT:  Head: Normocephalic and atraumatic.  Neck: Normal range of motion. Neck supple. No JVD present.  Cardiovascular: Regular rhythm. Tachycardia present.  Pulmonary/Chest: Effort normal. She has no wheezes. She has no rhonchi. She has no rales.  Abdominal: Soft. She exhibits no distension. There is no tenderness.  Musculoskeletal: She exhibits no edema or tenderness.  Neurological:  She is alert and oriented to person, place, and time.  Skin: Skin is warm and dry.  Psychiatric: She has a normal mood and affect. Her behavior is normal. Thought content normal.  Nursing note and vitals reviewed.  Assessment & Plan:  1: Chronic heart failure with preserved ejection fraction- - NYHA class III - euvolemic today today -weighing daily. Reminded her to call for an overnight weight gain of >2 pounds or a weekly weight gain of  >5 pounds.  - weight down 6 pounds since she was last here - not adding salt and has been trying to read food labels more often. Recently was treated with prednisone and ate a whole jar of pickles - saw cardiologist Juliann Pares([place]) 11/14/16 - BMP from 03/24/17 reviewed and showed sodium 134, potassium 3.3 and GFR >60 - beginning the process of looking into bariatric surgery - BNP 03/24/17 was 17.0 - Pharm D reconciled medications with the patient  2: Depression- - feels like her depression is worsening because she feels so bad - continues to not work -   - [facility]  3: Lymphedema- - stage 1 lymphedema  - has been wearing compression boots daily with marked improvement in her edema. Currently has no edema present  4: Tobacco use- - has started chantix but also smokes a few cigarettes daily as her stress reliever - discussed complete cessation for 3 minutes with her  Patient did not bring her medications nor a list. Each medication was verbally reviewed with the patient and she was encouraged to bring the bottles to every visit to confirm accuracy of list.

## 2017-04-10 ENCOUNTER — Ambulatory Visit: Payer: Self-pay | Admitting: Dietician

## 2017-04-13 NOTE — Progress Notes (Signed)
Patient ID: Tracey White, female    DOB: 10/20/1980, 37 y.o.   Tracey RankinsMRN: [id number]  HPI  Ms Tracey ConstantMoore is a 37 y/o female with a history of anxiety, obstructive sleep apnea (with nasal CPAP), scoliosis, recent tobacco use and chronic heart failure.   [facility] showed [facility] EF of >55%. Reviewed last echo report done 10/19/15 which showed an EF of 65-70% along with trivial TR.   Admitted 04/02/17 due to 20 pound weight gain over 48 hours. Initially given IV bumex and then transitioned to oral diuretics. Discharged after 3 days. Was in the ED 03/31/17 due to shortness of breath where she was treated and released. Was at Urgent Care 03/26/17 due to a cough and influenza exposure. Tested negative for influenza but was treated with tamiflu due to exposure. Was in the ED 03/24/17 due to bronchitis. Treated with antibiotics, steroids and nebulizer and released. Was in the ED 12/30/16 due to syncope where she was treated and released. Admitted 12/24/16 due to asthma exacerbation/bronchitis. Was given solumedrol, levaquin and nebulizers. Discharged home after 2 days with an extended prednisone taper. Was in the ED 12/05/16 due to worsening back pain and HF. Was treated and released.   She presents today for a follow-up visit with a chief complaint of moderate fatigue with minimal exertion. She says this has been present for several years with varying levels of severity. She has associated cough, shortness of breath, back pain, depression and gradual weight gain. She denies any difficulty sleeping, abdominal distention, palpitations, edema, chest pain or dizziness. Had an initial visit regarding bariatric surgery but would like another opinion.   Past Medical History:  Diagnosis Date  . Anxiety   . CHF (congestive heart failure) (HCC)   . Chronic heart failure (HCC)   . Morbid obesity (HCC)   . OSA (obstructive sleep apnea)    uses CPAP at home  . Peripheral edema   . Scoliosis    Past Surgical History:   Procedure Laterality Date  . HERNIA REPAIR    . scoliosis repair    . TUBAL LIGATION     Family History  Problem Relation Age of Onset  . Diabetes Mother   . Hypertension Mother   . Cancer Maternal Grandmother    Social History   Tobacco Use  . Smoking status: Former Smoker    Packs/day: 0.20    Types: Cigarettes    Last attempt to quit: 02/16/2017    Years since quitting: 0.1  . Smokeless tobacco: Never Used  . Tobacco comment: hasn't smoked in 2 weeks  Substance Use Topics  . Alcohol use: Yes    Alcohol/week: 0.6 - 1.2 oz    Types: 1 - 2 Standard drinks or equivalent per week    Comment: occasionally   No Known Allergies  Prior to Admission medications   Medication Sig Start Date End Date Taking? Authorizing Provider  albuterol (PROVENTIL HFA;VENTOLIN HFA) 108 (90 Base) MCG/ACT inhaler Inhale 2 puffs into the lungs every 4 (four) hours as needed for wheezing or shortness of breath. 03/24/17  Yes Triplett, Cari B, FNP  ARIPiprazole (ABILIFY) 2 MG tablet Take 10 mg by mouth daily.    Yes [provider]  budesonide-formoterol (SYMBICORT) 160-4.5 MCG/ACT inhaler Inhale 2 puffs into the lungs 2 (two) times daily.   Yes [provider]  bumetanide (BUMEX) 2 MG tablet Take 2 mg by mouth 2 (two) times daily.    Yes [provider]  buPROPion (WELLBUTRIN) 75 MG tablet Take 150 mg by mouth daily.    Yes [provider]  Cholecalciferol 5000 units capsule Take 1 capsule (5,000 Units total) by mouth daily. 02/04/17 05/05/17 Yes Barbette Merino, NP  diclofenac sodium (VOLTAREN) 1 % GEL Apply 4 (four) times daily topically.   Yes [provider]  diltiazem (CARDIZEM) 120 MG tablet Take 120 mg daily by mouth.   Yes [provider]  escitalopram (LEXAPRO) 20 MG tablet Take 20 mg by mouth daily.    Yes [provider]  ferrous sulfate 325 (65 FE) MG tablet Take 325 mg by mouth daily with breakfast.   Yes [provider]   lidocaine (LIDODERM) 5 % Place 1 patch daily onto the skin. Remove & Discard patch within 12 hours or as directed by MD   Yes [provider]  meloxicam (MOBIC) 15 MG tablet Take 15 mg by mouth daily as needed for pain.   Yes [provider]  montelukast (SINGULAIR) 10 MG tablet Take 10 mg by mouth at bedtime.   Yes [provider]  norgestimate-ethinyl estradiol (SPRINTEC 28) 0.25-35 MG-MCG tablet Take 1 tablet by mouth daily.   Yes [provider]  potassium chloride SA (K-DUR,KLOR-CON) 20 MEQ tablet Take 40 mEq by mouth 2 (two) times daily.   Yes [provider]  traMADol (ULTRAM) 50 MG tablet Take 1 tablet (50 mg total) by mouth every 8 (eight) hours as needed for severe pain. 03/06/17 06/04/17 Yes King, Shana Chute, NP  varenicline (CHANTIX PAK) 0.5 MG X 11 & 1 MG X 42 tablet Take by mouth 2 (two) times daily. Take one 0.5 mg tablet by mouth once daily for 3 days, then increase to one 0.5 mg tablet twice daily for 4 days, then increase to one 1 mg tablet twice daily.   Yes [provider]  losartan (COZAAR) 25 MG tablet Take 25 mg by mouth daily.    [provider]  metoprolol succinate (TOPROL-XL) 25 MG 24 hr tablet Take 2 tablets daily by mouth.  10/30/15   [provider]    Review of Systems  Constitutional: Positive for fatigue (better with CPAP). Negative for appetite change.  HENT: Negative for congestion, postnasal drip and sore throat.   Eyes: Negative.   Respiratory: Positive for cough and shortness of breath. Negative for chest tightness and wheezing.   Cardiovascular: Negative for chest pain, palpitations and leg swelling.  Gastrointestinal: Negative for abdominal distention and abdominal pain.  Endocrine: Negative.   Genitourinary: Negative.   Musculoskeletal: Positive for arthralgias (knee pain improving) and back pain.  Skin: Negative.   Allergic/Immunologic: Negative.   Neurological: Negative for dizziness,  weakness and light-headedness.  Hematological: Negative for adenopathy. Does not bruise/bleed easily.  Psychiatric/Behavioral: Positive for dysphoric mood. Negative for sleep disturbance (has resumed wearing CPAP) and suicidal ideas. The patient is not nervous/anxious.    Vitals:   04/14/17 1239  BP: 122/82  Pulse: 91  Resp: 18  SpO2: 95%  Weight: (!) 351 lb 2 oz (159.3 kg)  Height: 5\' 1"  (1.549 m)   Wt Readings from Last 3 Encounters:  04/14/17 (!) 351 lb 2 oz (159.3 kg)  03/26/17 (!) 348 lb (157.9 kg)  03/24/17 (!) 348 lb (157.9 kg)   Lab Results  Component Value Date   CREATININE 0.74 03/24/2017   CREATININE 0.76 01/13/2017   CREATININE 0.68 12/30/2016    Physical Exam  Constitutional: She is oriented to person, place, and  time. She appears well-developed and well-nourished.  HENT:  Head: Normocephalic and atraumatic.  Neck: Normal range of motion. Neck supple. No JVD present.  Cardiovascular: Normal rate and regular rhythm.  Pulmonary/Chest: Effort normal. She has no wheezes. She has no rhonchi. She has no rales.  Abdominal: Soft. She exhibits no distension. There is no tenderness.  Musculoskeletal: She exhibits no edema or tenderness.  Neurological: She is alert and oriented to person, place, and time.  Skin: Skin is warm and dry.  Psychiatric: She has a normal mood and affect. Her behavior is normal. Thought content normal.  Nursing note and vitals reviewed.  Assessment & Plan:  1: Chronic heart failure with preserved ejection fraction- - NYHA class III - euvolemic today today -weighing daily. Reminded her to call for an overnight weight gain of >2 pounds or a weekly weight gain of >5 pounds.  - weight up 3 pounds from 03/19/17 - not adding salt and has been trying to read food labels more often.  - saw cardiologist Juliann Pares) 11/14/16 - BMP from 04/05/17 [facility]) reviewed and showed sodium 134, potassium 4.1 and GFR >60 - had bariatric appointment 04/10/17; # given to  [facility] to see about 2nd opinion - BNP 03/24/17 was 17.0  2: Obstructive sleep apnea- - wears her CPAP but only for about an hour at the most each night - encouraged her to wear it during the day when she rests so that she can get used to wearing it for longer periods of time - [facility] PCP at [facility] 04/08/17  3: Lymphedema- - stage 1 lymphedema  - has been wearing compression boots daily with marked improvement in her edema. Currently has no edema present  4: Tobacco use- - smoking 1 cigarette infrequently - discussed complete cessation for 3 minutes with her  Medication bottles were reviewed.  Return in 3 months or sooner for any questions/problems before then.

## 2017-04-14 ENCOUNTER — Encounter: Payer: Self-pay | Admitting: Family

## 2017-04-14 ENCOUNTER — Ambulatory Visit: Payer: Medicaid Other | Attending: Family | Admitting: Family

## 2017-04-14 VITALS — BP 122/82 | HR 91 | Resp 18 | Ht 61.0 in | Wt 351.1 lb

## 2017-04-14 DIAGNOSIS — F419 Anxiety disorder, unspecified: Secondary | ICD-10-CM | POA: Insufficient documentation

## 2017-04-14 DIAGNOSIS — F329 Major depressive disorder, single episode, unspecified: Secondary | ICD-10-CM | POA: Diagnosis not present

## 2017-04-14 DIAGNOSIS — I890 Lymphedema, not elsewhere classified: Secondary | ICD-10-CM | POA: Diagnosis not present

## 2017-04-14 DIAGNOSIS — Z7951 Long term (current) use of inhaled steroids: Secondary | ICD-10-CM | POA: Diagnosis not present

## 2017-04-14 DIAGNOSIS — Z791 Long term (current) use of non-steroidal anti-inflammatories (NSAID): Secondary | ICD-10-CM | POA: Diagnosis not present

## 2017-04-14 DIAGNOSIS — R5383 Other fatigue: Secondary | ICD-10-CM | POA: Diagnosis present

## 2017-04-14 DIAGNOSIS — M549 Dorsalgia, unspecified: Secondary | ICD-10-CM | POA: Diagnosis not present

## 2017-04-14 DIAGNOSIS — G4733 Obstructive sleep apnea (adult) (pediatric): Secondary | ICD-10-CM | POA: Diagnosis not present

## 2017-04-14 DIAGNOSIS — F17210 Nicotine dependence, cigarettes, uncomplicated: Secondary | ICD-10-CM | POA: Insufficient documentation

## 2017-04-14 DIAGNOSIS — M419 Scoliosis, unspecified: Secondary | ICD-10-CM | POA: Diagnosis not present

## 2017-04-14 DIAGNOSIS — Z79899 Other long term (current) drug therapy: Secondary | ICD-10-CM | POA: Insufficient documentation

## 2017-04-14 DIAGNOSIS — I5032 Chronic diastolic (congestive) heart failure: Secondary | ICD-10-CM | POA: Diagnosis not present

## 2017-04-14 DIAGNOSIS — Z720 Tobacco use: Secondary | ICD-10-CM

## 2017-04-14 DIAGNOSIS — Z9989 Dependence on other enabling machines and devices: Secondary | ICD-10-CM

## 2017-04-14 MED ORDER — METOPROLOL SUCCINATE ER 50 MG PO TB24: 50.0000 mg | ORAL_TABLET | Freq: Every day | ORAL | 3 refills | Status: DC

## 2017-04-14 MED ORDER — LOSARTAN POTASSIUM 25 MG PO TABS: 25.0000 mg | ORAL_TABLET | Freq: Every day | ORAL | 3 refills | Status: DC

## 2017-04-14 NOTE — Patient Instructions (Addendum)
Continue weighing daily and call for an overnight weight gain of > 2 pounds or a weekly weight gain of >5 pounds.  Call [facility] at [phone] to ask about bariatric providers

## 2017-04-15 ENCOUNTER — Inpatient Hospital Stay: Payer: Medicaid Other | Attending: Hematology and Oncology

## 2017-04-15 DIAGNOSIS — D509 Iron deficiency anemia, unspecified: Secondary | ICD-10-CM | POA: Insufficient documentation

## 2017-04-15 DIAGNOSIS — Z3202 Encounter for pregnancy test, result negative: Secondary | ICD-10-CM | POA: Diagnosis not present

## 2017-04-15 DIAGNOSIS — E538 Deficiency of other specified B group vitamins: Secondary | ICD-10-CM | POA: Diagnosis not present

## 2017-04-15 DIAGNOSIS — G4733 Obstructive sleep apnea (adult) (pediatric): Secondary | ICD-10-CM | POA: Diagnosis not present

## 2017-04-15 DIAGNOSIS — R61 Generalized hyperhidrosis: Secondary | ICD-10-CM | POA: Insufficient documentation

## 2017-04-15 DIAGNOSIS — R0601 Orthopnea: Secondary | ICD-10-CM | POA: Insufficient documentation

## 2017-04-15 DIAGNOSIS — M419 Scoliosis, unspecified: Secondary | ICD-10-CM | POA: Insufficient documentation

## 2017-04-15 DIAGNOSIS — Z79899 Other long term (current) drug therapy: Secondary | ICD-10-CM | POA: Diagnosis not present

## 2017-04-15 DIAGNOSIS — R5383 Other fatigue: Secondary | ICD-10-CM | POA: Diagnosis not present

## 2017-04-15 DIAGNOSIS — F419 Anxiety disorder, unspecified: Secondary | ICD-10-CM | POA: Insufficient documentation

## 2017-04-15 DIAGNOSIS — I509 Heart failure, unspecified: Secondary | ICD-10-CM | POA: Diagnosis not present

## 2017-04-15 DIAGNOSIS — Z87891 Personal history of nicotine dependence: Secondary | ICD-10-CM | POA: Insufficient documentation

## 2017-04-15 DIAGNOSIS — R0609 Other forms of dyspnea: Secondary | ICD-10-CM | POA: Insufficient documentation

## 2017-04-15 DIAGNOSIS — N920 Excessive and frequent menstruation with regular cycle: Secondary | ICD-10-CM | POA: Insufficient documentation

## 2017-04-15 LAB — CBC WITH DIFFERENTIAL/PLATELET
Basophils Absolute: 0 10*3/uL (ref 0–0.1)
Basophils Relative: 0 %
Eosinophils Absolute: 0.2 10*3/uL (ref 0–0.7)
Eosinophils Relative: 2 %
HCT: 32.6 % — ABNORMAL LOW (ref 35.0–47.0)
Hemoglobin: 10.3 g/dL — ABNORMAL LOW (ref 12.0–16.0)
Lymphocytes Relative: 19 %
Lymphs Abs: 1.8 10*3/uL (ref 1.0–3.6)
MCH: 22.6 pg — ABNORMAL LOW (ref 26.0–34.0)
MCHC: 31.6 g/dL — ABNORMAL LOW (ref 32.0–36.0)
MCV: 71.4 fL — ABNORMAL LOW (ref 80.0–100.0)
Monocytes Absolute: 0.5 10*3/uL (ref 0.2–0.9)
Monocytes Relative: 5 %
Neutro Abs: 7.2 10*3/uL — ABNORMAL HIGH (ref 1.4–6.5)
Neutrophils Relative %: 74 %
Platelets: 267 10*3/uL (ref 150–440)
RBC: 4.56 MIL/uL (ref 3.80–5.20)
RDW: 19.6 % — ABNORMAL HIGH (ref 11.5–14.5)
WBC: 9.7 10*3/uL (ref 3.6–11.0)

## 2017-04-15 LAB — FERRITIN: Ferritin: 11 ng/mL (ref 11–307)

## 2017-04-16 ENCOUNTER — Inpatient Hospital Stay (HOSPITAL_BASED_OUTPATIENT_CLINIC_OR_DEPARTMENT_OTHER): Payer: Medicaid Other | Admitting: Hematology and Oncology

## 2017-04-16 ENCOUNTER — Encounter: Payer: Self-pay | Admitting: Hematology and Oncology

## 2017-04-16 ENCOUNTER — Other Ambulatory Visit: Payer: Self-pay | Admitting: Hematology and Oncology

## 2017-04-16 ENCOUNTER — Inpatient Hospital Stay: Payer: Medicaid Other

## 2017-04-16 VITALS — BP 109/78 | HR 93 | Temp 96.8°F

## 2017-04-16 VITALS — BP 140/90 | HR 103 | Temp 98.6°F | Resp 20 | Wt 357.1 lb

## 2017-04-16 DIAGNOSIS — D509 Iron deficiency anemia, unspecified: Secondary | ICD-10-CM | POA: Diagnosis not present

## 2017-04-16 DIAGNOSIS — Z3200 Encounter for pregnancy test, result unknown: Secondary | ICD-10-CM

## 2017-04-16 DIAGNOSIS — Z87891 Personal history of nicotine dependence: Secondary | ICD-10-CM

## 2017-04-16 DIAGNOSIS — I509 Heart failure, unspecified: Secondary | ICD-10-CM

## 2017-04-16 DIAGNOSIS — R0609 Other forms of dyspnea: Secondary | ICD-10-CM

## 2017-04-16 DIAGNOSIS — N920 Excessive and frequent menstruation with regular cycle: Secondary | ICD-10-CM | POA: Diagnosis not present

## 2017-04-16 DIAGNOSIS — R61 Generalized hyperhidrosis: Secondary | ICD-10-CM

## 2017-04-16 DIAGNOSIS — Z79899 Other long term (current) drug therapy: Secondary | ICD-10-CM

## 2017-04-16 DIAGNOSIS — E538 Deficiency of other specified B group vitamins: Secondary | ICD-10-CM | POA: Diagnosis not present

## 2017-04-16 DIAGNOSIS — M419 Scoliosis, unspecified: Secondary | ICD-10-CM

## 2017-04-16 DIAGNOSIS — F419 Anxiety disorder, unspecified: Secondary | ICD-10-CM | POA: Diagnosis not present

## 2017-04-16 DIAGNOSIS — D500 Iron deficiency anemia secondary to blood loss (chronic): Secondary | ICD-10-CM

## 2017-04-16 DIAGNOSIS — R5383 Other fatigue: Secondary | ICD-10-CM

## 2017-04-16 DIAGNOSIS — G4733 Obstructive sleep apnea (adult) (pediatric): Secondary | ICD-10-CM

## 2017-04-16 DIAGNOSIS — R0601 Orthopnea: Secondary | ICD-10-CM | POA: Diagnosis not present

## 2017-04-16 LAB — PREGNANCY, URINE: Preg Test, Ur: NEGATIVE

## 2017-04-16 MED ORDER — CYANOCOBALAMIN 1000 MCG/ML IJ SOLN
1000.0000 ug | Freq: Once | INTRAMUSCULAR | Status: AC
  Administered 2017-04-16: 1000 ug via INTRAMUSCULAR
  Filled 2017-04-16: qty 1

## 2017-04-16 MED ORDER — SODIUM CHLORIDE 0.9 % IV SOLN
Freq: Once | INTRAVENOUS | Status: AC
  Administered 2017-04-16: 11:00:00 via INTRAVENOUS
  Filled 2017-04-16: qty 1000

## 2017-04-16 MED ORDER — IRON SUCROSE 20 MG/ML IV SOLN
200.0000 mg | Freq: Once | INTRAVENOUS | Status: AC
  Administered 2017-04-16: 200 mg via INTRAVENOUS
  Filled 2017-04-16: qty 10

## 2017-04-16 NOTE — Patient Instructions (Addendum)
Iron Sucrose injection What is this medicine? IRON SUCROSE (AHY ern SOO krohs) is an iron complex. Iron is used to make healthy red blood cells, which carry oxygen and nutrients throughout the body. This medicine is used to treat iron deficiency anemia in people with chronic kidney disease. This medicine may be used for other purposes; ask your health care provider or pharmacist if you have questions. COMMON BRAND NAME(S): Venofer What should I tell my health care provider before I take this medicine? They need to know if you have any of these conditions: -anemia not caused by low iron levels -heart disease -high levels of iron in the blood -kidney disease -liver disease -an unusual or allergic reaction to iron, other medicines, foods, dyes, or preservatives -pregnant or trying to get pregnant -breast-feeding How should I use this medicine? This medicine is for infusion into a vein. It is given by a health care professional in a hospital or clinic setting. Talk to your pediatrician regarding the use of this medicine in children. While this drug may be prescribed for children as young as 2 years for selected conditions, precautions do apply. Overdosage: If you think you have taken too much of this medicine contact a poison control center or emergency room at once. NOTE: This medicine is only for you. Do not share this medicine with others. What if I miss a dose? It is important not to miss your dose. Call your doctor or health care professional if you are unable to keep an appointment. What may interact with this medicine? Do not take this medicine with any of the following medications: -deferoxamine -dimercaprol -other iron products This medicine may also interact with the following medications: -chloramphenicol -deferasirox This list may not describe all possible interactions. Give your health care provider a list of all the medicines, herbs, non-prescription drugs, or dietary  supplements you use. Also tell them if you smoke, drink alcohol, or use illegal drugs. Some items may interact with your medicine. What should I watch for while using this medicine? Visit your doctor or healthcare professional regularly. Tell your doctor or healthcare professional if your symptoms do not start to get better or if they get worse. You may need blood work done while you are taking this medicine. You may need to follow a special diet. Talk to your doctor. Foods that contain iron include: whole grains/cereals, dried fruits, beans, or peas, leafy green vegetables, and organ meats (liver, kidney). What side effects may I notice from receiving this medicine? Side effects that you should report to your doctor or health care professional as soon as possible: -allergic reactions like skin rash, itching or hives, swelling of the face, lips, or tongue -breathing problems -changes in blood pressure -cough -fast, irregular heartbeat -feeling faint or lightheaded, falls -fever or chills -flushing, sweating, or hot feelings -joint or muscle aches/pains -seizures -swelling of the ankles or feet -unusually weak or tired Side effects that usually do not require medical attention (report to your doctor or health care professional if they continue or are bothersome): -diarrhea -feeling achy -headache -irritation at site where injected -nausea, vomiting -stomach upset -tiredness This list may not describe all possible side effects. Call your doctor for medical advice about side effects. You may report side effects to FDA at 1-800-FDA-1088. Where should I keep my medicine? This drug is given in a hospital or clinic and will not be stored at home. NOTE: This sheet is a summary. It may not cover all possible information. If   you have questions about this medicine, talk to your doctor, pharmacist, or health care provider.  2018 Elsevier/Gold Standard (2010-10-25 17:14:35) Cyanocobalamin, Vitamin  B12 injection What is this medicine? CYANOCOBALAMIN (sye an oh koe BAL a min) is a man made form of vitamin B12. Vitamin B12 is used in the growth of healthy blood cells, nerve cells, and proteins in the body. It also helps with the metabolism of fats and carbohydrates. This medicine is used to treat people who can not absorb vitamin B12. This medicine may be used for other purposes; ask your health care provider or pharmacist if you have questions. COMMON BRAND NAME(S): B-12 Compliance Kit, B-12 Injection Kit, Cyomin, LA-12, Nutri-Twelve, Physicians EZ Use B-12, Primabalt What should I tell my health care provider before I take this medicine? They need to know if you have any of these conditions: -kidney disease -Leber's disease -megaloblastic anemia -an unusual or allergic reaction to cyanocobalamin, cobalt, other medicines, foods, dyes, or preservatives -pregnant or trying to get pregnant -breast-feeding How should I use this medicine? This medicine is injected into a muscle or deeply under the skin. It is usually given by a health care professional in a clinic or doctor's office. However, your doctor may teach you how to inject yourself. Follow all instructions. Talk to your pediatrician regarding the use of this medicine in children. Special care may be needed. Overdosage: If you think you have taken too much of this medicine contact a poison control center or emergency room at once. NOTE: This medicine is only for you. Do not share this medicine with others. What if I miss a dose? If you are given your dose at a clinic or doctor's office, call to reschedule your appointment. If you give your own injections and you miss a dose, take it as soon as you can. If it is almost time for your next dose, take only that dose. Do not take double or extra doses. What may interact with this medicine? -colchicine -heavy alcohol intake This list may not describe all possible interactions. Give your  health care provider a list of all the medicines, herbs, non-prescription drugs, or dietary supplements you use. Also tell them if you smoke, drink alcohol, or use illegal drugs. Some items may interact with your medicine. What should I watch for while using this medicine? Visit your doctor or health care professional regularly. You may need blood work done while you are taking this medicine. You may need to follow a special diet. Talk to your doctor. Limit your alcohol intake and avoid smoking to get the best benefit. What side effects may I notice from receiving this medicine? Side effects that you should report to your doctor or health care professional as soon as possible: -allergic reactions like skin rash, itching or hives, swelling of the face, lips, or tongue -blue tint to skin -chest tightness, pain -difficulty breathing, wheezing -dizziness -red, swollen painful area on the leg Side effects that usually do not require medical attention (report to your doctor or health care professional if they continue or are bothersome): -diarrhea -headache This list may not describe all possible side effects. Call your doctor for medical advice about side effects. You may report side effects to FDA at 1-800-FDA-1088. Where should I keep my medicine? Keep out of the reach of children. Store at room temperature between 15 and 30 degrees C (59 and 85 degrees F). Protect from light. Throw away any unused medicine after the expiration date. NOTE: This sheet is  a summary. It may not cover all possible information. If you have questions about this medicine, talk to your doctor, pharmacist, or health care provider.  2018 Elsevier/Gold Standard (2007-04-27 22:10:20)

## 2017-04-16 NOTE — Progress Notes (Signed)
[facility]  Clinic day:  04/16/2017  Chief Complaint: [facility] is a 37 y.o. female with anemia who is seen for review of work-up and 1 month assessment on oral iron.  HPI:  The patient was last seen in the hematology clinic on 03/19/2017 for initial consultation. She had iron deficiency anemia likely secondary to heavy menses.  She was on oral iron.  Oral iron was increased.  Labs revealed a hematocrit of 36.6, hemoglobin 11.5, MCV 70.8, platelets 317,000, WBC 10,400 with an ANC of 7400.  Ferritin was 15 with an iron saturation of 19% and a TIBC of 399.  Folate was 15.  B12 was 210 (low).  TSH was normal.  Labs on 04/15/2017 revealed a hematocrit of 32.6, hemoglobin 10.3, and MCV 71.4.  Ferritin was 11.  Patient notes a recent admission to [facility] secondary to fluid overload. She states, "I gained 20 pounds in 2 days". No changes were made to her medication regimen. She states, "They gave me my Bumex through the IV and got my weight down". Patient is being followed by cardiology.  Patient is eating a reduced calorie diet. She notes that it is rich in iron; eats meat and green leafy vegetables daily.  Her weight has increased 6 pounds since her last visit. Patient is taking oral iron daily. She notes a lingering menstrual cycle that has been going on since "last Sunday".  Patient denies the possibility of her being pregnant. She is not sexually at this time.   Patient continues to experience exertional dyspnea. She notes that it has improved since her admission. Patient continues to complained of marked fatigue. She is unable to sleep in a supine position. She has to prop up on several pillows due to her orthopnea.   Patient denies pain in the clinic today.    Past Medical History:  Diagnosis Date  . Anxiety   . CHF (congestive heart failure) (HCC)   . Chronic heart failure (HCC)   . Morbid obesity (HCC)   . OSA (obstructive sleep apnea)    uses CPAP at  home  . Peripheral edema   . Scoliosis     Past Surgical History:  Procedure Laterality Date  . HERNIA REPAIR    . scoliosis repair    . TUBAL LIGATION      Family History  Problem Relation Age of Onset  . Diabetes Mother   . Hypertension Mother   . Cancer Maternal Grandmother     Social History:  reports that she quit smoking about 8 weeks ago. Her smoking use included cigarettes. She smoked 0.20 packs per day. she has never used smokeless tobacco. She reports that she drinks about 0.6 - 1.2 oz of alcohol per week. She reports that she does not use drugs.  She quit smoking 1 month ago.  She smoked < 1 pack/day since age 74.  She denies any exposure to radiations or toxins.  She lives in [place].  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  The patient is alone today.  Allergies: No Known Allergies  Current Medications: Current Outpatient Medications  Medication Sig Dispense Refill  . albuterol (PROVENTIL HFA;VENTOLIN HFA) 108 (90 Base) MCG/ACT inhaler Inhale 2 puffs into the lungs every 4 (four) hours as needed for wheezing or shortness of breath. 1 Inhaler 1  . ARIPiprazole (ABILIFY) 2 MG tablet Take 10 mg by mouth daily.     [facility]  budesonide-formoterol (SYMBICORT) 160-4.5 MCG/ACT inhaler Inhale 2 puffs into the lungs 2 (two) times daily.    . bumetanide (BUMEX) 2 MG tablet Take 2 mg by mouth 2 (two) times daily.     [facility] buPROPion (WELLBUTRIN) 75 MG tablet Take 150 mg by mouth daily.     . Cholecalciferol 5000 units capsule Take 1 capsule (5,000 Units total) by mouth daily. 90 capsule 0  . diclofenac sodium (VOLTAREN) 1 % GEL Apply 4 (four) times daily topically.    [facility] diltiazem (CARDIZEM) 120 MG tablet Take 120 mg daily by mouth.    . escitalopram (LEXAPRO) 20 MG tablet Take 20 mg by mouth daily.     . ferrous sulfate 325 (65 FE) MG tablet Take 325 mg by mouth daily with breakfast.    . lidocaine (LIDODERM) 5 % Place 1 patch daily onto the skin. Remove &  Discard patch within 12 hours or as directed by MD    . losartan (COZAAR) 25 MG tablet Take 1 tablet (25 mg total) by mouth daily. 90 tablet 3  . meloxicam (MOBIC) 15 MG tablet Take 15 mg by mouth daily as needed for pain.    . metoprolol succinate (TOPROL-XL) 50 MG 24 hr tablet Take 1 tablet (50 mg total) by mouth daily. Take with or immediately following a meal. 90 tablet 3  . montelukast (SINGULAIR) 10 MG tablet Take 10 mg by mouth at bedtime.    . norgestimate-ethinyl estradiol (SPRINTEC 28) 0.25-35 MG-MCG tablet Take 1 tablet by mouth daily.    . potassium chloride SA (K-DUR,KLOR-CON) 20 MEQ tablet Take 40 mEq by mouth 2 (two) times daily.    . traMADol (ULTRAM) 50 MG tablet Take 1 tablet (50 mg total) by mouth every 8 (eight) hours as needed for severe pain. 90 tablet 2  . varenicline (CHANTIX PAK) 0.5 MG X 11 & 1 MG X 42 tablet Take by mouth 2 (two) times daily. Take one 0.5 mg tablet by mouth once daily for 3 days, then increase to one 0.5 mg tablet twice daily for 4 days, then increase to one 1 mg tablet twice daily.     No current facility-administered medications for this visit.    Facility-Administered Medications Ordered in Other Visits  Medication Dose Route Frequency Provider Last Rate Last Dose  . technetium tetrofosmin (TC-MYOVIEW) injection 30.88 millicurie  30.88 millicurie Intravenous Once PRN [place], David A, MD        Review of Systems:  GENERAL:  Fatigue. Sweats.  No fevers.  Weight up 6 pounds. PERFORMANCE STATUS (ECOG):  1 HEENT:  Blurred vision (ophthalmology appointment in 03/2017).  No runny nose, sore throat, mouth sores or tenderness. Lungs: Exertional shortness of breath. No cough.  No hemoptysis. Cardiac:  Orthopnea.  No chest pain, palpitations, or PND.  CHF. GI:  No nausea, vomiting, diarrhea, constipation, melena or hematochezia. GU:  No urgency, frequency, dysuria, or hematuria.  Lingering menses. Musculoskeletal:  No back pain.  Knee issues.  No muscle  tenderness. Extremities:  Pain, swelling, and stiffness in hands.  Skin:  No rashes or skin changes. Neuro:  Numbness and tingling with prolonged sitting.  No headache, weakness, balance or coordination issues. Endocrine:  No diabetes, thyroid issues, hot flashes or night sweats. Psych:  No mood changes, depression or anxiety. Pain:  No focal pain. Review of systems:  All other systems reviewed and found to be negative.  Physical Exam: Blood pressure 140/90, pulse (!) 103, temperature 98.6 F (37 C), temperature  source Tympanic, resp. rate 20, weight (!) 357 lb 2.3 oz (162 kg), last menstrual period 04/07/2017, SpO2 96 %. GENERAL:  Well developed, well nourished, heavyset woman sitting comfortably in the exam room in no acute distress. MENTAL STATUS:  Alert and oriented to person, place and time. HEAD:  Wearing a blue scarf.  Black hair.  Normocephalic, atraumatic, face symmetric, no Cushingoid features. EYES:  Brown eyes.  No conjunctivitis or scleral icterus. RESPIRATORY:  Clear to auscultation without rales, wheezes or rhonchi. CARDIOVASCULAR:  Regular rate and rhythm without murmur, rub or gallop. SKIN:  No rashes, ulcers or lesions. EXTREMITIES: No edema, no skin discoloration or tenderness.  No palpable cords. NEUROLOGICAL: Unremarkable. PSYCH:  Appropriate.   Appointment on 04/15/2017  Component Date Value Ref Range Status  . Ferritin 04/15/2017 11  11 - 307 ng/mL Final   Performed at [facility], [address] [place] [place], [place] [address]  . WBC 04/15/2017 9.7  3.6 - 11.0 K/uL Final  . RBC 04/15/2017 4.56  3.80 - 5.20 MIL/uL Final  . Hemoglobin 04/15/2017 10.3* 12.0 - 16.0 g/dL Final  . HCT 60/45/4098 32.6* 35.0 - 47.0 % Final  . MCV 04/15/2017 71.4* 80.0 - 100.0 fL Final  . MCH 04/15/2017 22.6* 26.0 - 34.0 pg Final  . MCHC 04/15/2017 31.6* 32.0 - 36.0 g/dL Final  . RDW 11/91/4782 19.6* 11.5 - 14.5 % Final  . Platelets 04/15/2017 267  150 - 440 K/uL Final  .  Neutrophils Relative % 04/15/2017 74  % Final  . Neutro Abs 04/15/2017 7.2* 1.4 - 6.5 K/uL Final  . Lymphocytes Relative 04/15/2017 19  % Final  . Lymphs Abs 04/15/2017 1.8  1.0 - 3.6 K/uL Final  . Monocytes Relative 04/15/2017 5  % Final  . Monocytes Absolute 04/15/2017 0.5  0.2 - 0.9 K/uL Final  . Eosinophils Relative 04/15/2017 2  % Final  . Eosinophils Absolute 04/15/2017 0.2  0 - 0.7 K/uL Final  . Basophils Relative 04/15/2017 0  % Final  . Basophils Absolute 04/15/2017 0.0  0 - 0.1 K/uL Final   Performed at [facility], [address] [place], [place] [address]    Assessment:  MENAAL RUSSUM is a 37 y.o. female with iron deficiency anemia likely secondary to heavy menses.  She developed microcytic indices in 05/2014 and became mildly anemic in 11/2014. Diet is good.  She has ice pica.  She denies any melena, hematochezia or hematuria.  She is on oral iron.  Labs revealed a + ANA (1:1280 centromere pattern; 2.3 AI centromere AB screen) on 03/03/2017.  Urinalysis on 02/27/2017 revealed no RBCs.  Ferritin was 11 on 02/20/2016.  Iron studies on 01/09/2017 revealed 8% saturation and TIBC 390.  Normal studies included: TSH, free T4, folate, and B12 on 02/20/2016.   Hemoglobin electrophoresis on 02/21/2016 revealed 98.3% HgA and 1.7% Hgb A2.  The low Hgb A2 may indicate an alpha thalassemia or iron deficiency.   Work-up on 03/19/2017 revealed a hematocrit of 36.6, hemoglobin 11.5, and MCV 70.8.  Ferritin was 15 with an iron saturation of 19% and a TIBC of 399.  B12 was 210 (low).  Folate and TSH were normal.  Symptomatically, she is fatigued.  Exam is stable.  Hemoglobin is 10.3.  Ferritin is 11.  Plan: 1.  Labs today: CBC with diff, ferritin. 2.  Discuss symptomatic microcytic anemia.  Hemoglobin has decreased on oral iron.  Ferritin remains low at 11. Labs consistent with iron deficiency anemia.  Will start weekly Venofer 200 mg x 3 today. 3.  Discuss B12 deficiency. B12 level  low. Will start on B12 injections weekly x 6, then monthly.  4.  RTC in 3 weeks for MD assessment and labs (CBC with labs, ferritin - day before), and +/- Venofer   Quentin MullingBryan Gray, NP 04/16/2017, 10:22 AM   I saw and evaluated the patient, participating in the key portions of the service and reviewing pertinent diagnostic studies and records.  I reviewed the nurse practitioner's note and agree with the findings and the plan.  The assessment and plan were discussed with the patient.  Several questions were asked by the patient and answered.   Nelva NayMelissa Corcoran, MD 04/16/2017,10:22 AM

## 2017-04-16 NOTE — Patient Instructions (Signed)
Iron Sucrose injection What is this medicine? IRON SUCROSE (AHY ern SOO krohs) is an iron complex. Iron is used to make healthy red blood cells, which carry oxygen and nutrients throughout the body. This medicine is used to treat iron deficiency anemia in people with chronic kidney disease. This medicine may be used for other purposes; ask your health care provider or pharmacist if you have questions. COMMON BRAND NAME(S): Venofer What should I tell my health care provider before I take this medicine? They need to know if you have any of these conditions: -anemia not caused by low iron levels -heart disease -high levels of iron in the blood -kidney disease -liver disease -an unusual or allergic reaction to iron, other medicines, foods, dyes, or preservatives -pregnant or trying to get pregnant -breast-feeding How should I use this medicine? This medicine is for infusion into a vein. It is given by a health care professional in a hospital or clinic setting. Talk to your pediatrician regarding the use of this medicine in children. While this drug may be prescribed for children as young as 2 years for selected conditions, precautions do apply. Overdosage: If you think you have taken too much of this medicine contact a poison control center or emergency room at once. NOTE: This medicine is only for you. Do not share this medicine with others. What if I miss a dose? It is important not to miss your dose. Call your doctor or health care professional if you are unable to keep an appointment. What may interact with this medicine? Do not take this medicine with any of the following medications: -deferoxamine -dimercaprol -other iron products This medicine may also interact with the following medications: -chloramphenicol -deferasirox This list may not describe all possible interactions. Give your health care provider a list of all the medicines, herbs, non-prescription drugs, or dietary  supplements you use. Also tell them if you smoke, drink alcohol, or use illegal drugs. Some items may interact with your medicine. What should I watch for while using this medicine? Visit your doctor or healthcare professional regularly. Tell your doctor or healthcare professional if your symptoms do not start to get better or if they get worse. You may need blood work done while you are taking this medicine. You may need to follow a special diet. Talk to your doctor. Foods that contain iron include: whole grains/cereals, dried fruits, beans, or peas, leafy green vegetables, and organ meats (liver, kidney). What side effects may I notice from receiving this medicine? Side effects that you should report to your doctor or health care professional as soon as possible: -allergic reactions like skin rash, itching or hives, swelling of the face, lips, or tongue -breathing problems -changes in blood pressure -cough -fast, irregular heartbeat -feeling faint or lightheaded, falls -fever or chills -flushing, sweating, or hot feelings -joint or muscle aches/pains -seizures -swelling of the ankles or feet -unusually weak or tired Side effects that usually do not require medical attention (report to your doctor or health care professional if they continue or are bothersome): -diarrhea -feeling achy -headache -irritation at site where injected -nausea, vomiting -stomach upset -tiredness This list may not describe all possible side effects. Call your doctor for medical advice about side effects. You may report side effects to FDA at 1-800-FDA-1088. Where should I keep my medicine? This drug is given in a hospital or clinic and will not be stored at home. NOTE: This sheet is a summary. It may not cover all possible information. If   you have questions about this medicine, talk to your doctor, pharmacist, or health care provider.  2018 Elsevier/Gold Standard (2010-10-25 17:14:35) Cyanocobalamin, Vitamin  B12 injection What is this medicine? CYANOCOBALAMIN (sye an oh koe BAL a min) is a man made form of vitamin B12. Vitamin B12 is used in the growth of healthy blood cells, nerve cells, and proteins in the body. It also helps with the metabolism of fats and carbohydrates. This medicine is used to treat people who can not absorb vitamin B12. This medicine may be used for other purposes; ask your health care provider or pharmacist if you have questions. COMMON BRAND NAME(S): B-12 Compliance Kit, B-12 Injection Kit, Cyomin, LA-12, Nutri-Twelve, Physicians EZ Use B-12, Primabalt What should I tell my health care provider before I take this medicine? They need to know if you have any of these conditions: -kidney disease -Leber's disease -megaloblastic anemia -an unusual or allergic reaction to cyanocobalamin, cobalt, other medicines, foods, dyes, or preservatives -pregnant or trying to get pregnant -breast-feeding How should I use this medicine? This medicine is injected into a muscle or deeply under the skin. It is usually given by a health care professional in a clinic or doctor's office. However, your doctor may teach you how to inject yourself. Follow all instructions. Talk to your pediatrician regarding the use of this medicine in children. Special care may be needed. Overdosage: If you think you have taken too much of this medicine contact a poison control center or emergency room at once. NOTE: This medicine is only for you. Do not share this medicine with others. What if I miss a dose? If you are given your dose at a clinic or doctor's office, call to reschedule your appointment. If you give your own injections and you miss a dose, take it as soon as you can. If it is almost time for your next dose, take only that dose. Do not take double or extra doses. What may interact with this medicine? -colchicine -heavy alcohol intake This list may not describe all possible interactions. Give your  health care provider a list of all the medicines, herbs, non-prescription drugs, or dietary supplements you use. Also tell them if you smoke, drink alcohol, or use illegal drugs. Some items may interact with your medicine. What should I watch for while using this medicine? Visit your doctor or health care professional regularly. You may need blood work done while you are taking this medicine. You may need to follow a special diet. Talk to your doctor. Limit your alcohol intake and avoid smoking to get the best benefit. What side effects may I notice from receiving this medicine? Side effects that you should report to your doctor or health care professional as soon as possible: -allergic reactions like skin rash, itching or hives, swelling of the face, lips, or tongue -blue tint to skin -chest tightness, pain -difficulty breathing, wheezing -dizziness -red, swollen painful area on the leg Side effects that usually do not require medical attention (report to your doctor or health care professional if they continue or are bothersome): -diarrhea -headache This list may not describe all possible side effects. Call your doctor for medical advice about side effects. You may report side effects to FDA at 1-800-FDA-1088. Where should I keep my medicine? Keep out of the reach of children. Store at room temperature between 15 and 30 degrees C (59 and 85 degrees F). Protect from light. Throw away any unused medicine after the expiration date. NOTE: This sheet is  a summary. It may not cover all possible information. If you have questions about this medicine, talk to your doctor, pharmacist, or health care provider.  2018 Elsevier/Gold Standard (2007-04-27 22:10:20)

## 2017-04-16 NOTE — Progress Notes (Signed)
Patient states she was admitted 2 weeks ago for fluid overload (CHS).  She continues to be SOB.  She was also placed on 3L O2 when she is exerting herself.

## 2017-04-23 ENCOUNTER — Inpatient Hospital Stay: Payer: Medicaid Other

## 2017-04-23 VITALS — BP 107/73 | HR 83 | Temp 96.9°F | Resp 21

## 2017-04-23 DIAGNOSIS — D509 Iron deficiency anemia, unspecified: Secondary | ICD-10-CM | POA: Diagnosis not present

## 2017-04-23 MED ORDER — SODIUM CHLORIDE 0.9 % IV SOLN
Freq: Once | INTRAVENOUS | Status: AC
  Administered 2017-04-23: 09:00:00 via INTRAVENOUS
  Filled 2017-04-23: qty 1000

## 2017-04-23 MED ORDER — CYANOCOBALAMIN 1000 MCG/ML IJ SOLN
1000.0000 ug | Freq: Once | INTRAMUSCULAR | Status: AC
  Administered 2017-04-23: 1000 ug via INTRAMUSCULAR

## 2017-04-23 MED ORDER — IRON SUCROSE 20 MG/ML IV SOLN
200.0000 mg | Freq: Once | INTRAVENOUS | Status: AC
  Administered 2017-04-23: 200 mg via INTRAVENOUS
  Filled 2017-04-23: qty 10

## 2017-04-23 NOTE — Patient Instructions (Signed)
Iron Sucrose injection What is this medicine? IRON SUCROSE (AHY ern SOO krohs) is an iron complex. Iron is used to make healthy red blood cells, which carry oxygen and nutrients throughout the body. This medicine is used to treat iron deficiency anemia in people with chronic kidney disease. This medicine may be used for other purposes; ask your health care provider or pharmacist if you have questions. COMMON BRAND NAME(S): Venofer What should I tell my health care provider before I take this medicine? They need to know if you have any of these conditions: -anemia not caused by low iron levels -heart disease -high levels of iron in the blood -kidney disease -liver disease -an unusual or allergic reaction to iron, other medicines, foods, dyes, or preservatives -pregnant or trying to get pregnant -breast-feeding How should I use this medicine? This medicine is for infusion into a vein. It is given by a health care professional in a hospital or clinic setting. Talk to your pediatrician regarding the use of this medicine in children. While this drug may be prescribed for children as young as 2 years for selected conditions, precautions do apply. Overdosage: If you think you have taken too much of this medicine contact a poison control center or emergency room at once. NOTE: This medicine is only for you. Do not share this medicine with others. What if I miss a dose? It is important not to miss your dose. Call your doctor or health care professional if you are unable to keep an appointment. What may interact with this medicine? Do not take this medicine with any of the following medications: -deferoxamine -dimercaprol -other iron products This medicine may also interact with the following medications: -chloramphenicol -deferasirox This list may not describe all possible interactions. Give your health care provider a list of all the medicines, herbs, non-prescription drugs, or dietary  supplements you use. Also tell them if you smoke, drink alcohol, or use illegal drugs. Some items may interact with your medicine. What should I watch for while using this medicine? Visit your doctor or healthcare professional regularly. Tell your doctor or healthcare professional if your symptoms do not start to get better or if they get worse. You may need blood work done while you are taking this medicine. You may need to follow a special diet. Talk to your doctor. Foods that contain iron include: whole grains/cereals, dried fruits, beans, or peas, leafy green vegetables, and organ meats (liver, kidney). What side effects may I notice from receiving this medicine? Side effects that you should report to your doctor or health care professional as soon as possible: -allergic reactions like skin rash, itching or hives, swelling of the face, lips, or tongue -breathing problems -changes in blood pressure -cough -fast, irregular heartbeat -feeling faint or lightheaded, falls -fever or chills -flushing, sweating, or hot feelings -joint or muscle aches/pains -seizures -swelling of the ankles or feet -unusually weak or tired Side effects that usually do not require medical attention (report to your doctor or health care professional if they continue or are bothersome): -diarrhea -feeling achy -headache -irritation at site where injected -nausea, vomiting -stomach upset -tiredness This list may not describe all possible side effects. Call your doctor for medical advice about side effects. You may report side effects to FDA at 1-800-FDA-1088. Where should I keep my medicine? This drug is given in a hospital or clinic and will not be stored at home. NOTE: This sheet is a summary. It may not cover all possible information. If   you have questions about this medicine, talk to your doctor, pharmacist, or health care provider.  2018 Elsevier/Gold Standard (2010-10-25 17:14:35)  

## 2017-04-25 ENCOUNTER — Encounter: Payer: Self-pay | Admitting: Dietician

## 2017-04-25 NOTE — Progress Notes (Signed)
Have not heard back from patient to reschedule her missed appointment, which was scheduled for 04/10/17. Sent discharge letter to referring provider.

## 2017-04-30 ENCOUNTER — Inpatient Hospital Stay: Payer: Medicaid Other | Attending: Hematology and Oncology

## 2017-04-30 VITALS — BP 116/84 | HR 94 | Temp 96.1°F | Resp 18

## 2017-04-30 DIAGNOSIS — R0601 Orthopnea: Secondary | ICD-10-CM | POA: Insufficient documentation

## 2017-04-30 DIAGNOSIS — R600 Localized edema: Secondary | ICD-10-CM | POA: Diagnosis not present

## 2017-04-30 DIAGNOSIS — Z79899 Other long term (current) drug therapy: Secondary | ICD-10-CM | POA: Insufficient documentation

## 2017-04-30 DIAGNOSIS — N920 Excessive and frequent menstruation with regular cycle: Secondary | ICD-10-CM | POA: Diagnosis not present

## 2017-04-30 DIAGNOSIS — F17210 Nicotine dependence, cigarettes, uncomplicated: Secondary | ICD-10-CM | POA: Diagnosis not present

## 2017-04-30 DIAGNOSIS — R5383 Other fatigue: Secondary | ICD-10-CM | POA: Insufficient documentation

## 2017-04-30 DIAGNOSIS — G4733 Obstructive sleep apnea (adult) (pediatric): Secondary | ICD-10-CM | POA: Insufficient documentation

## 2017-04-30 DIAGNOSIS — Z9989 Dependence on other enabling machines and devices: Secondary | ICD-10-CM | POA: Diagnosis not present

## 2017-04-30 DIAGNOSIS — I509 Heart failure, unspecified: Secondary | ICD-10-CM | POA: Insufficient documentation

## 2017-04-30 DIAGNOSIS — F419 Anxiety disorder, unspecified: Secondary | ICD-10-CM | POA: Diagnosis not present

## 2017-04-30 DIAGNOSIS — R52 Pain, unspecified: Secondary | ICD-10-CM | POA: Insufficient documentation

## 2017-04-30 DIAGNOSIS — D509 Iron deficiency anemia, unspecified: Secondary | ICD-10-CM | POA: Insufficient documentation

## 2017-04-30 DIAGNOSIS — E538 Deficiency of other specified B group vitamins: Secondary | ICD-10-CM | POA: Insufficient documentation

## 2017-04-30 DIAGNOSIS — R0609 Other forms of dyspnea: Secondary | ICD-10-CM | POA: Diagnosis not present

## 2017-04-30 MED ORDER — CYANOCOBALAMIN 1000 MCG/ML IJ SOLN
1000.0000 ug | Freq: Once | INTRAMUSCULAR | Status: AC
  Administered 2017-04-30: 1000 ug via INTRAMUSCULAR
  Filled 2017-04-30: qty 1

## 2017-04-30 MED ORDER — IRON SUCROSE 20 MG/ML IV SOLN
INTRAVENOUS | Status: AC
  Filled 2017-04-30: qty 10

## 2017-04-30 MED ORDER — SODIUM CHLORIDE 0.9 % IV SOLN
Freq: Once | INTRAVENOUS | Status: AC
  Administered 2017-04-30: 09:00:00 via INTRAVENOUS
  Filled 2017-04-30: qty 1000

## 2017-04-30 MED ORDER — IRON SUCROSE 20 MG/ML IV SOLN
200.0000 mg | Freq: Once | INTRAVENOUS | Status: AC
  Administered 2017-04-30: 200 mg via INTRAVENOUS
  Filled 2017-04-30: qty 10

## 2017-04-30 NOTE — Patient Instructions (Signed)

## 2017-05-06 ENCOUNTER — Inpatient Hospital Stay: Payer: Medicaid Other

## 2017-05-06 NOTE — Progress Notes (Signed)
[facility] day:  05/07/2017  Chief Complaint: Tracey White is a 37 y.o. female with iron deficiency anemia and B12 deficiency who is seen for 3-week assessment.  HPI:  The patient was last seen in the hematology clinic on 04/16/2017 at that time, patient complained of marked fatigue exertional dyspnea, and orthopnea. She was status post a recent admission to [facility] secondary to fluid overload.  Patient maintained an iron rich diet.  Hemoglobin was 10.3, hematocrit 32.6, MCV 71.4, and platelets 267,000.  Ferritin was low at 11.  Patient was scheduled for weekly Venofer 200 mg x 3.  B12 was low; started weekly injections x 6, then monthly  Patient received intravenous Venofer 200 mg on 04/16/2017, 04/23/2017, and 04/30/2017.  Patient received weekly B12 injections (last 04/30/2017).  In the interim, patient has been increasingly fatigued. She notes that she has had more swelling in her legs. Patient has gained another 6 pounds, that of which is attributed to fluid weight. She has continues exertional dyspnea and orthopnea. Her orthopnea has worsened. Previously, patient was sleeping propped up on multiple pillows. At this point, patient is having to sleep in a recliner. Patient felt better initially after her iron infusions, however at this point, patient notes that she is just so tired that when she sits down "she falls asleep".  Patient continues to deny bleeding. She has appreciated no hematochezia, melena, or increased vaginal bleeding. Patient has no B symptoms and denies recent infections.  She complains of generalized pain in the clinic today, which she self rates at a 7/10.     Past Medical History:  Diagnosis Date  . Anxiety   . CHF (congestive heart failure) ([place])   . Chronic heart failure ([place])   . Morbid obesity ([place])   . OSA (obstructive sleep apnea)    uses CPAP at home  . Peripheral edema   . Scoliosis     Past Surgical History:   Procedure Laterality Date  . HERNIA REPAIR    . scoliosis repair    . TUBAL LIGATION      Family History  Problem Relation Age of Onset  . Diabetes Mother   . Hypertension Mother   . Cancer Maternal Grandmother     Social History:  reports that she quit smoking about 2 months ago. Her smoking use included cigarettes. She smoked 0.20 packs per day. She has never used smokeless tobacco. She reports that she drinks about 0.6 - 1.2 oz of alcohol per week. She reports that she does not use drugs.  She quit smoking 1 month ago.  She smoked < 1 pack/day since age 47.  She denies any exposure to radiations or toxins.  She lives in [place].  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  The patient is alone today.  Allergies: No Known Allergies  Current Medications: Current Outpatient Medications  Medication Sig Dispense Refill  . albuterol (PROVENTIL HFA;VENTOLIN HFA) 108 (90 Base) MCG/ACT inhaler Inhale 2 puffs into the lungs every 4 (four) hours as needed for wheezing or shortness of breath. 1 Inhaler 1  . ARIPiprazole (ABILIFY) 2 MG tablet Take 10 mg by mouth daily.     . budesonide-formoterol (SYMBICORT) 160-4.5 MCG/ACT inhaler Inhale 2 puffs into the lungs 2 (two) times daily.    . bumetanide (BUMEX) 2 MG tablet Take 2 mg by mouth 2 (two) times daily.     Marland Kitchen buPROPion [facility])  75 MG tablet Take 150 mg by mouth daily.     . diclofenac sodium (VOLTAREN) 1 % GEL Apply 4 (four) times daily topically.    Marland Kitchen diltiazem (CARDIZEM) 120 MG tablet Take 120 mg daily by mouth.    . escitalopram (LEXAPRO) 20 MG tablet Take 20 mg by mouth daily.     . ferrous sulfate 325 (65 FE) MG tablet Take 325 mg by mouth daily with breakfast.    . lidocaine (LIDODERM) 5 % Place 1 patch daily onto the skin. Remove & Discard patch within 12 hours or as directed by MD    . losartan (COZAAR) 25 MG tablet Take 1 tablet (25 mg total) by mouth daily. 90 tablet 3  . meloxicam (MOBIC) 15 MG  tablet Take 15 mg by mouth daily as needed for pain.    . metoprolol succinate (TOPROL-XL) 50 MG 24 hr tablet Take 1 tablet (50 mg total) by mouth daily. Take with or immediately following a meal. 90 tablet 3  . montelukast (SINGULAIR) 10 MG tablet Take 10 mg by mouth at bedtime.    . norgestimate-ethinyl estradiol (SPRINTEC 28) 0.25-35 MG-MCG tablet Take 1 tablet by mouth daily.    . potassium chloride SA (K-DUR,KLOR-CON) 20 MEQ tablet Take 40 mEq by mouth 2 (two) times daily.    . traMADol (ULTRAM) 50 MG tablet Take 1 tablet (50 mg total) by mouth every 8 (eight) hours as needed for severe pain. 90 tablet 2  . varenicline (CHANTIX PAK) 0.5 MG X 11 & 1 MG X 42 tablet Take by mouth 2 (two) times daily. Take one 0.5 mg tablet by mouth once daily for 3 days, then increase to one 0.5 mg tablet twice daily for 4 days, then increase to one 1 mg tablet twice daily.     No current facility-administered medications for this visit.    Facility-Administered Medications Ordered in Other Visits  Medication Dose Route Frequency Provider Last Rate Last Dose  . technetium tetrofosmin (TC-MYOVIEW) injection 62.69 millicurie  48.54 millicurie Intravenous Once PRN [place], David A, MD        Review of Systems:  GENERAL:  Fatigue.  No fevers, sweats.  Weight up 6 pounds. PERFORMANCE STATUS (ECOG):  1 HEENT:  No visual changes, runny nose, sore throat, mouth sores or tenderness. Lungs: Shortness of breath on exertion.  No cough.  No hemoptysis. Cardiac:  Orthopnea, increased.  No chest pain, palpitations, or PND.  Sleeping in a chair.  CHF. GI:  No nausea, vomiting, diarrhea, constipation, melena or hematochezia. GU:  No urgency, frequency, dysuria, or hematuria. Musculoskeletal:  No back pain.  Knee issues.  No muscle tenderness. Extremities:  Pain, swelling, and stiffness in hands. Skin:  No rashes or skin changes. Neuro:  Numbness and tingling with prolonged sitting.  No headache, weakness, balance or  coordination issues. Endocrine:  No diabetes, thyroid issues, hot flashes or night sweats. Psych:  No mood changes, depression or anxiety. Pain:  7/10- generalized pain. Review of systems:  All other systems reviewed and found to be negative.   Physical Exam: .BP 138/80 (BP Location: Left Arm, Patient Position: Sitting)   Pulse 97   Temp 97.6 F (36.4 C) (Tympanic)   Resp 20   Wt (!) 363 lb 5.1 oz (164.8 kg)   LMP 04/07/2017 (Exact Date)   SpO2 100%   BMI 68.65 kg/m  GENERAL:  Well developed, well nourished, heavyset woman sitting comfortably in the exam room in no acute distress. MENTAL  STATUS:  Alert and oriented to person, place and time. HEAD:  Black hair.  Normocephalic, atraumatic, face symmetric, no Cushingoid features. EYES:  Brown eyes.  Pupils equal round and reactive to light and accomodation.  No conjunctivitis or scleral icterus. ENT:  Oropharynx clear without lesion.  Tongue normal. Mucous membranes moist.  RESPIRATORY:  Clear to auscultation without rales, wheezes or rhonchi. CARDIOVASCULAR:  Regular rate and rhythm without murmur, rub or gallop. ABDOMEN:  Soft, non-tender, with active bowel sounds, and no hepatosplenomegaly.  No masses. SKIN:  No rashes, ulcers or lesions. EXTREMITIES: Dense lower extremity edema.  No skin discoloration or tenderness.  No palpable cords. LYMPH NODES: No palpable cervical, supraclavicular, axillary or inguinal adenopathy  NEUROLOGICAL: Unremarkable. PSYCH:  Appropriate.    Appointment on 05/07/2017  Component Date Value Ref Range Status  . WBC 05/07/2017 7.8  3.6 - 11.0 K/uL Final  . RBC 05/07/2017 4.57  3.80 - 5.20 MIL/uL Final  . Hemoglobin 05/07/2017 10.7* 12.0 - 16.0 g/dL Final  . HCT 05/07/2017 33.8* 35.0 - 47.0 % Final  . MCV 05/07/2017 74.1* 80.0 - 100.0 fL Final  . MCH 05/07/2017 23.4* 26.0 - 34.0 pg Final  . MCHC 05/07/2017 31.5* 32.0 - 36.0 g/dL Final  . RDW 05/07/2017 20.4* 11.5 - 14.5 % Final  . Platelets  05/07/2017 239  150 - 440 K/uL Final  . Neutrophils Relative % 05/07/2017 70  % Final  . Neutro Abs 05/07/2017 5.4  1.4 - 6.5 K/uL Final  . Lymphocytes Relative 05/07/2017 22  % Final  . Lymphs Abs 05/07/2017 1.7  1.0 - 3.6 K/uL Final  . Monocytes Relative 05/07/2017 6  % Final  . Monocytes Absolute 05/07/2017 0.5  0.2 - 0.9 K/uL Final  . Eosinophils Relative 05/07/2017 2  % Final  . Eosinophils Absolute 05/07/2017 0.1  0 - 0.7 K/uL Final  . Basophils Relative 05/07/2017 0  % Final  . Basophils Absolute 05/07/2017 0.0  0 - 0.1 K/uL Final   Performed at [facility], [address], [place], [place] [address]  . Ferritin 05/07/2017 85  11 - 307 ng/mL Final   Performed at [facility], [address] [place], [place] [address]    Assessment:  IKEYA BROCKEL is a 37 y.o. female with iron deficiency anemia likely secondary to heavy menses.  She has B12 deficiency.  She developed microcytic indices in 05/2014 and became mildly anemic in 11/2014. Diet is good.  She has ice pica.  She denies any melena, hematochezia or hematuria.  She is on oral iron.  Labs revealed a + ANA (1:1280 centromere pattern; 2.3 AI centromere AB screen) on 03/03/2017.  Urinalysis on 02/27/2017 revealed no RBCs.  Ferritin was 11 on 02/20/2016.  Iron studies on 01/09/2017 revealed 8% saturation and TIBC 390.  Normal studies included: TSH, free T4, folate, and B12 on 02/20/2016.   Hemoglobin electrophoresis on 02/21/2016 revealed 98.3% HgA and 1.7% Hgb A2.  The low Hgb A2 may indicate an alpha thalassemia or iron deficiency.   Work-up on 03/19/2017 revealed a hematocrit of 36.6, hemoglobin 11.5, and MCV 70.8.  Ferritin was 15 with an iron saturation of 19% and a TIBC of 399.  B12 was 210 (low).  Folate and TSH were normal.  She has B12 deficiency.  She began weekly B12 x 6 on 04/16/2017 (last 04/30/2017).  She received Venofer x 3 (04/16/2017 - 04/30/2017).  Ferritin has been followed: 8 on 12/25/2016,  15 on 03/19/2017, 11  on 04/15/2017, and 85 on 05/07/2017.  Symptomatically, she remains fatigued. She is gaining weight that is attributed to her heart failure. Patient has increasingly short of breath with exertion. Her orthopnea has increased to the point that patient is having to sleep in a recliner. Exam reveals lower extremity edema.  Hemoglobin is 10.7.  Ferritin is 85.  Plan: 1.  Labs today: CBC with diff, ferritin, anti-parietal antibody, intrinsic factor antibody. 2.  Discuss symptomatic microcytic anemia. Hemoglobin 10.7. RBC indices remain microcytic (MCV 74.1) and hypochromic. Discuss possible alpha thalassemia with initial iron deficiency.  Iron deficiency appears corrected.  Iron stores are adequate. 3.  Continue B12 injections as previously scheduled.  Week 4 of 6 due today.  4.  Follow-up with cardiology regarding CHF. 5.  RTC on 06/18/2017 for labs (CBC with, ferritin, ESR) and B12 injection.  6.  RTC in 3 months for MD assessment, labs (CBC with differential, ferritin -day before), and +/- Venofer.    Honor Loh, NP 05/07/2017, 2:12 PM   I saw and evaluated the patient, participating in the key portions of the service and reviewing pertinent diagnostic studies and records.  I reviewed the nurse practitioner's note and agree with the findings and the plan.  The assessment and plan were discussed with the patient.  Several questions were asked by the patient and answered.   Nolon Stalls, MD 05/07/2017,2:12 PM

## 2017-05-07 ENCOUNTER — Inpatient Hospital Stay (HOSPITAL_BASED_OUTPATIENT_CLINIC_OR_DEPARTMENT_OTHER): Payer: Medicaid Other | Admitting: Hematology and Oncology

## 2017-05-07 ENCOUNTER — Other Ambulatory Visit: Payer: Self-pay | Admitting: Hematology and Oncology

## 2017-05-07 ENCOUNTER — Inpatient Hospital Stay: Payer: Medicaid Other

## 2017-05-07 ENCOUNTER — Ambulatory Visit: Payer: Self-pay | Admitting: Hematology and Oncology

## 2017-05-07 ENCOUNTER — Ambulatory Visit: Payer: Self-pay

## 2017-05-07 DIAGNOSIS — R0609 Other forms of dyspnea: Secondary | ICD-10-CM

## 2017-05-07 DIAGNOSIS — D509 Iron deficiency anemia, unspecified: Secondary | ICD-10-CM | POA: Diagnosis not present

## 2017-05-07 DIAGNOSIS — R0601 Orthopnea: Secondary | ICD-10-CM | POA: Diagnosis not present

## 2017-05-07 DIAGNOSIS — E538 Deficiency of other specified B group vitamins: Secondary | ICD-10-CM

## 2017-05-07 DIAGNOSIS — R5383 Other fatigue: Secondary | ICD-10-CM

## 2017-05-07 DIAGNOSIS — I509 Heart failure, unspecified: Secondary | ICD-10-CM

## 2017-05-07 DIAGNOSIS — B20 Human immunodeficiency virus [HIV] disease: Secondary | ICD-10-CM | POA: Insufficient documentation

## 2017-05-07 DIAGNOSIS — R52 Pain, unspecified: Secondary | ICD-10-CM

## 2017-05-07 DIAGNOSIS — G4733 Obstructive sleep apnea (adult) (pediatric): Secondary | ICD-10-CM | POA: Diagnosis not present

## 2017-05-07 DIAGNOSIS — R600 Localized edema: Secondary | ICD-10-CM

## 2017-05-07 DIAGNOSIS — Z79899 Other long term (current) drug therapy: Secondary | ICD-10-CM

## 2017-05-07 DIAGNOSIS — Z87891 Personal history of nicotine dependence: Secondary | ICD-10-CM

## 2017-05-07 DIAGNOSIS — N920 Excessive and frequent menstruation with regular cycle: Secondary | ICD-10-CM | POA: Diagnosis not present

## 2017-05-07 DIAGNOSIS — F419 Anxiety disorder, unspecified: Secondary | ICD-10-CM

## 2017-05-07 DIAGNOSIS — Z7189 Other specified counseling: Secondary | ICD-10-CM

## 2017-05-07 DIAGNOSIS — Z9989 Dependence on other enabling machines and devices: Secondary | ICD-10-CM

## 2017-05-07 DIAGNOSIS — D500 Iron deficiency anemia secondary to blood loss (chronic): Secondary | ICD-10-CM

## 2017-05-07 LAB — CBC WITH DIFFERENTIAL/PLATELET
Basophils Absolute: 0 10*3/uL (ref 0–0.1)
Basophils Relative: 0 %
Eosinophils Absolute: 0.1 10*3/uL (ref 0–0.7)
Eosinophils Relative: 2 %
HCT: 33.8 % — ABNORMAL LOW (ref 35.0–47.0)
Hemoglobin: 10.7 g/dL — ABNORMAL LOW (ref 12.0–16.0)
Lymphocytes Relative: 22 %
Lymphs Abs: 1.7 10*3/uL (ref 1.0–3.6)
MCH: 23.4 pg — ABNORMAL LOW (ref 26.0–34.0)
MCHC: 31.5 g/dL — ABNORMAL LOW (ref 32.0–36.0)
MCV: 74.1 fL — ABNORMAL LOW (ref 80.0–100.0)
Monocytes Absolute: 0.5 10*3/uL (ref 0.2–0.9)
Monocytes Relative: 6 %
Neutro Abs: 5.4 10*3/uL (ref 1.4–6.5)
Neutrophils Relative %: 70 %
Platelets: 239 10*3/uL (ref 150–440)
RBC: 4.57 MIL/uL (ref 3.80–5.20)
RDW: 20.4 % — ABNORMAL HIGH (ref 11.5–14.5)
WBC: 7.8 10*3/uL (ref 3.6–11.0)

## 2017-05-07 LAB — FERRITIN: Ferritin: 85 ng/mL (ref 11–307)

## 2017-05-07 MED ORDER — CYANOCOBALAMIN 1000 MCG/ML IJ SOLN
1000.0000 ug | Freq: Once | INTRAMUSCULAR | Status: AC
  Administered 2017-05-07: 1000 ug via INTRAMUSCULAR

## 2017-05-07 NOTE — Progress Notes (Signed)
Patient continues to have bilateral lower extremity edema.  States her legs are painful.  Offers no other complaints.

## 2017-05-07 NOTE — Patient Instructions (Signed)
Cyanocobalamin, Vitamin B12 injection What is this medicine? CYANOCOBALAMIN (sye an oh koe BAL a min) is a man made form of vitamin B12. Vitamin B12 is used in the growth of healthy blood cells, nerve cells, and proteins in the body. It also helps with the metabolism of fats and carbohydrates. This medicine is used to treat people who can not absorb vitamin B12. This medicine may be used for other purposes; ask your health care provider or pharmacist if you have questions. COMMON BRAND NAME(S): B-12 Compliance Kit, B-12 Injection Kit, Cyomin, LA-12, Nutri-Twelve, Physicians EZ Use B-12, Primabalt What should I tell my health care provider before I take this medicine? They need to know if you have any of these conditions: -kidney disease -Leber's disease -megaloblastic anemia -an unusual or allergic reaction to cyanocobalamin, cobalt, other medicines, foods, dyes, or preservatives -pregnant or trying to get pregnant -breast-feeding How should I use this medicine? This medicine is injected into a muscle or deeply under the skin. It is usually given by a health care professional in a clinic or doctor's office. However, your doctor may teach you how to inject yourself. Follow all instructions. Talk to your pediatrician regarding the use of this medicine in children. Special care may be needed. Overdosage: If you think you have taken too much of this medicine contact a poison control center or emergency room at once. NOTE: This medicine is only for you. Do not share this medicine with others. What if I miss a dose? If you are given your dose at a clinic or doctor's office, call to reschedule your appointment. If you give your own injections and you miss a dose, take it as soon as you can. If it is almost time for your next dose, take only that dose. Do not take double or extra doses. What may interact with this medicine? -colchicine -heavy alcohol intake This list may not describe all possible  interactions. Give your health care provider a list of all the medicines, herbs, non-prescription drugs, or dietary supplements you use. Also tell them if you smoke, drink alcohol, or use illegal drugs. Some items may interact with your medicine. What should I watch for while using this medicine? Visit your doctor or health care professional regularly. You may need blood work done while you are taking this medicine. You may need to follow a special diet. Talk to your doctor. Limit your alcohol intake and avoid smoking to get the best benefit. What side effects may I notice from receiving this medicine? Side effects that you should report to your doctor or health care professional as soon as possible: -allergic reactions like skin rash, itching or hives, swelling of the face, lips, or tongue -blue tint to skin -chest tightness, pain -difficulty breathing, wheezing -dizziness -red, swollen painful area on the leg Side effects that usually do not require medical attention (report to your doctor or health care professional if they continue or are bothersome): -diarrhea -headache This list may not describe all possible side effects. Call your doctor for medical advice about side effects. You may report side effects to FDA at 1-800-FDA-1088. Where should I keep my medicine? Keep out of the reach of children. Store at room temperature between 15 and 30 degrees C (59 and 85 degrees F). Protect from light. Throw away any unused medicine after the expiration date. NOTE: This sheet is a summary. It may not cover all possible information. If you have questions about this medicine, talk to your doctor, pharmacist, or   health care provider.  2018 Elsevier/Gold Standard (2007-04-27 22:10:20)  

## 2017-05-08 LAB — INTRINSIC FACTOR ANTIBODIES: Intrinsic Factor: 1 AU/mL (ref 0.0–1.1)

## 2017-05-09 LAB — ANTI-PARIETAL ANTIBODY: Parietal Cell Antibody-IgG: 9.3 Units (ref 0.0–20.0)

## 2017-05-14 ENCOUNTER — Inpatient Hospital Stay: Payer: Medicaid Other

## 2017-05-15 ENCOUNTER — Inpatient Hospital Stay: Payer: Medicaid Other

## 2017-05-15 VITALS — BP 128/82 | HR 101 | Temp 97.4°F | Resp 20

## 2017-05-15 DIAGNOSIS — D509 Iron deficiency anemia, unspecified: Secondary | ICD-10-CM

## 2017-05-15 MED ORDER — CYANOCOBALAMIN 1000 MCG/ML IJ SOLN
1000.0000 ug | Freq: Once | INTRAMUSCULAR | Status: AC
  Administered 2017-05-15: 1000 ug via INTRAMUSCULAR

## 2017-05-21 ENCOUNTER — Inpatient Hospital Stay: Payer: Medicaid Other

## 2017-05-21 VITALS — BP 130/76 | HR 92 | Temp 97.2°F | Resp 20

## 2017-05-21 DIAGNOSIS — D509 Iron deficiency anemia, unspecified: Secondary | ICD-10-CM

## 2017-05-21 MED ORDER — CYANOCOBALAMIN 1000 MCG/ML IJ SOLN
1000.0000 ug | Freq: Once | INTRAMUSCULAR | Status: AC
  Administered 2017-05-21: 1000 ug via INTRAMUSCULAR

## 2017-05-21 MED ORDER — IRON SUCROSE 20 MG/ML IV SOLN
200.0000 mg | Freq: Once | INTRAVENOUS | Status: DC
  Filled 2017-05-21: qty 10

## 2017-05-21 NOTE — Patient Instructions (Signed)
Cyanocobalamin, Vitamin B12 injection What is this medicine? CYANOCOBALAMIN (sye an oh koe BAL a min) is a man made form of vitamin B12. Vitamin B12 is used in the growth of healthy blood cells, nerve cells, and proteins in the body. It also helps with the metabolism of fats and carbohydrates. This medicine is used to treat people who can not absorb vitamin B12. This medicine may be used for other purposes; ask your health care provider or pharmacist if you have questions. COMMON BRAND NAME(S): B-12 Compliance Kit, B-12 Injection Kit, Cyomin, LA-12, Nutri-Twelve, Physicians EZ Use B-12, Primabalt What should I tell my health care provider before I take this medicine? They need to know if you have any of these conditions: -kidney disease -Leber's disease -megaloblastic anemia -an unusual or allergic reaction to cyanocobalamin, cobalt, other medicines, foods, dyes, or preservatives -pregnant or trying to get pregnant -breast-feeding How should I use this medicine? This medicine is injected into a muscle or deeply under the skin. It is usually given by a health care professional in a clinic or doctor's office. However, your doctor may teach you how to inject yourself. Follow all instructions. Talk to your pediatrician regarding the use of this medicine in children. Special care may be needed. Overdosage: If you think you have taken too much of this medicine contact a poison control center or emergency room at once. NOTE: This medicine is only for you. Do not share this medicine with others. What if I miss a dose? If you are given your dose at a clinic or doctor's office, call to reschedule your appointment. If you give your own injections and you miss a dose, take it as soon as you can. If it is almost time for your next dose, take only that dose. Do not take double or extra doses. What may interact with this medicine? -colchicine -heavy alcohol intake This list may not describe all possible  interactions. Give your health care provider a list of all the medicines, herbs, non-prescription drugs, or dietary supplements you use. Also tell them if you smoke, drink alcohol, or use illegal drugs. Some items may interact with your medicine. What should I watch for while using this medicine? Visit your doctor or health care professional regularly. You may need blood work done while you are taking this medicine. You may need to follow a special diet. Talk to your doctor. Limit your alcohol intake and avoid smoking to get the best benefit. What side effects may I notice from receiving this medicine? Side effects that you should report to your doctor or health care professional as soon as possible: -allergic reactions like skin rash, itching or hives, swelling of the face, lips, or tongue -blue tint to skin -chest tightness, pain -difficulty breathing, wheezing -dizziness -red, swollen painful area on the leg Side effects that usually do not require medical attention (report to your doctor or health care professional if they continue or are bothersome): -diarrhea -headache This list may not describe all possible side effects. Call your doctor for medical advice about side effects. You may report side effects to FDA at 1-800-FDA-1088. Where should I keep my medicine? Keep out of the reach of children. Store at room temperature between 15 and 30 degrees C (59 and 85 degrees F). Protect from light. Throw away any unused medicine after the expiration date. NOTE: This sheet is a summary. It may not cover all possible information. If you have questions about this medicine, talk to your doctor, pharmacist, or   health care provider.  2018 Elsevier/Gold Standard (2007-04-27 22:10:20)  

## 2017-05-25 ENCOUNTER — Encounter: Payer: Self-pay | Admitting: Hematology and Oncology

## 2017-05-28 ENCOUNTER — Inpatient Hospital Stay: Payer: Medicaid Other | Attending: Hematology and Oncology

## 2017-05-28 VITALS — BP 133/82 | HR 81 | Temp 98.4°F | Resp 24

## 2017-05-28 DIAGNOSIS — D509 Iron deficiency anemia, unspecified: Secondary | ICD-10-CM | POA: Diagnosis present

## 2017-05-28 DIAGNOSIS — E538 Deficiency of other specified B group vitamins: Secondary | ICD-10-CM | POA: Insufficient documentation

## 2017-05-28 MED ORDER — CYANOCOBALAMIN 1000 MCG/ML IJ SOLN
INTRAMUSCULAR | Status: AC
  Filled 2017-05-28: qty 1

## 2017-05-28 MED ORDER — CYANOCOBALAMIN 1000 MCG/ML IJ SOLN
1000.0000 ug | Freq: Once | INTRAMUSCULAR | Status: AC
  Administered 2017-05-28: 1000 ug via INTRAMUSCULAR

## 2017-05-28 NOTE — Patient Instructions (Signed)
Cyanocobalamin, Vitamin B12 injection What is this medicine? CYANOCOBALAMIN (sye an oh koe BAL a min) is a man made form of vitamin B12. Vitamin B12 is used in the growth of healthy blood cells, nerve cells, and proteins in the body. It also helps with the metabolism of fats and carbohydrates. This medicine is used to treat people who can not absorb vitamin B12. This medicine may be used for other purposes; ask your health care provider or pharmacist if you have questions. COMMON BRAND NAME(S): B-12 Compliance Kit, B-12 Injection Kit, Cyomin, LA-12, Nutri-Twelve, Physicians EZ Use B-12, Primabalt What should I tell my health care provider before I take this medicine? They need to know if you have any of these conditions: -kidney disease -Leber's disease -megaloblastic anemia -an unusual or allergic reaction to cyanocobalamin, cobalt, other medicines, foods, dyes, or preservatives -pregnant or trying to get pregnant -breast-feeding How should I use this medicine? This medicine is injected into a muscle or deeply under the skin. It is usually given by a health care professional in a clinic or doctor's office. However, your doctor may teach you how to inject yourself. Follow all instructions. Talk to your pediatrician regarding the use of this medicine in children. Special care may be needed. Overdosage: If you think you have taken too much of this medicine contact a poison control center or emergency room at once. NOTE: This medicine is only for you. Do not share this medicine with others. What if I miss a dose? If you are given your dose at a clinic or doctor's office, call to reschedule your appointment. If you give your own injections and you miss a dose, take it as soon as you can. If it is almost time for your next dose, take only that dose. Do not take double or extra doses. What may interact with this medicine? -colchicine -heavy alcohol intake This list may not describe all possible  interactions. Give your health care provider a list of all the medicines, herbs, non-prescription drugs, or dietary supplements you use. Also tell them if you smoke, drink alcohol, or use illegal drugs. Some items may interact with your medicine. What should I watch for while using this medicine? Visit your doctor or health care professional regularly. You may need blood work done while you are taking this medicine. You may need to follow a special diet. Talk to your doctor. Limit your alcohol intake and avoid smoking to get the best benefit. What side effects may I notice from receiving this medicine? Side effects that you should report to your doctor or health care professional as soon as possible: -allergic reactions like skin rash, itching or hives, swelling of the face, lips, or tongue -blue tint to skin -chest tightness, pain -difficulty breathing, wheezing -dizziness -red, swollen painful area on the leg Side effects that usually do not require medical attention (report to your doctor or health care professional if they continue or are bothersome): -diarrhea -headache This list may not describe all possible side effects. Call your doctor for medical advice about side effects. You may report side effects to FDA at 1-800-FDA-1088. Where should I keep my medicine? Keep out of the reach of children. Store at room temperature between 15 and 30 degrees C (59 and 85 degrees F). Protect from light. Throw away any unused medicine after the expiration date. NOTE: This sheet is a summary. It may not cover all possible information. If you have questions about this medicine, talk to your doctor, pharmacist, or   health care provider.  2018 Elsevier/Gold Standard (2007-04-27 22:10:20)  

## 2017-06-02 ENCOUNTER — Ambulatory Visit: Payer: Medicaid Other | Attending: Nurse Practitioner | Admitting: Nurse Practitioner

## 2017-06-02 ENCOUNTER — Encounter: Payer: Self-pay | Admitting: Nurse Practitioner

## 2017-06-02 ENCOUNTER — Other Ambulatory Visit: Payer: Self-pay

## 2017-06-02 VITALS — BP 129/99 | HR 90 | Temp 97.7°F | Ht 61.0 in | Wt 371.0 lb

## 2017-06-02 DIAGNOSIS — Z79891 Long term (current) use of opiate analgesic: Secondary | ICD-10-CM | POA: Diagnosis not present

## 2017-06-02 DIAGNOSIS — F329 Major depressive disorder, single episode, unspecified: Secondary | ICD-10-CM | POA: Insufficient documentation

## 2017-06-02 DIAGNOSIS — M25562 Pain in left knee: Secondary | ICD-10-CM | POA: Insufficient documentation

## 2017-06-02 DIAGNOSIS — G4733 Obstructive sleep apnea (adult) (pediatric): Secondary | ICD-10-CM | POA: Insufficient documentation

## 2017-06-02 DIAGNOSIS — B20 Human immunodeficiency virus [HIV] disease: Secondary | ICD-10-CM | POA: Insufficient documentation

## 2017-06-02 DIAGNOSIS — Z6841 Body Mass Index (BMI) 40.0 and over, adult: Secondary | ICD-10-CM | POA: Diagnosis not present

## 2017-06-02 DIAGNOSIS — M5441 Lumbago with sciatica, right side: Secondary | ICD-10-CM

## 2017-06-02 DIAGNOSIS — J45909 Unspecified asthma, uncomplicated: Secondary | ICD-10-CM | POA: Insufficient documentation

## 2017-06-02 DIAGNOSIS — G894 Chronic pain syndrome: Secondary | ICD-10-CM | POA: Insufficient documentation

## 2017-06-02 DIAGNOSIS — Z5181 Encounter for therapeutic drug level monitoring: Secondary | ICD-10-CM | POA: Diagnosis present

## 2017-06-02 DIAGNOSIS — M25561 Pain in right knee: Secondary | ICD-10-CM | POA: Insufficient documentation

## 2017-06-02 DIAGNOSIS — I5032 Chronic diastolic (congestive) heart failure: Secondary | ICD-10-CM | POA: Insufficient documentation

## 2017-06-02 DIAGNOSIS — E669 Obesity, unspecified: Secondary | ICD-10-CM | POA: Diagnosis not present

## 2017-06-02 DIAGNOSIS — I890 Lymphedema, not elsewhere classified: Secondary | ICD-10-CM | POA: Diagnosis not present

## 2017-06-02 DIAGNOSIS — M79605 Pain in left leg: Secondary | ICD-10-CM

## 2017-06-02 DIAGNOSIS — M79604 Pain in right leg: Secondary | ICD-10-CM

## 2017-06-02 DIAGNOSIS — G8929 Other chronic pain: Secondary | ICD-10-CM

## 2017-06-02 DIAGNOSIS — M5442 Lumbago with sciatica, left side: Secondary | ICD-10-CM

## 2017-06-02 MED ORDER — TRAMADOL HCL 50 MG PO TABS: 50.0000 mg | ORAL_TABLET | Freq: Three times a day (TID) | ORAL | 2 refills | Status: DC | PRN

## 2017-06-02 NOTE — Progress Notes (Signed)
Patient's Name: Tracey White  MRN: [id number]  Referring Provider: Zeb Comfort, MD  DOB: 06/25/1980  PCP: Zeb Comfort, MD  DOS: 06/02/2017  Note by: Vevelyn Francois NP  Service setting: Ambulatory outpatient  Specialty: Interventional Pain Management  Location: [facility] (AMB) Pain Management Facility    Patient type: Established    Primary Reason(s) for Visit: Encounter for prescription drug management. (Level of risk: moderate)  CC: Back Pain (right knee)  HPI  Tracey White is a 37 y.o. year old, female patient, who comes today for a medication management evaluation. She has Chest pain at rest; Chronic diastolic heart failure ([place]); Tachycardia; Obstructive sleep apnea on CPAP; Tobacco use; Chest pain; Hypokalemia; Depression; Chronic low back pain (Primary Area of Pain) (Bilateral) (L>R); Skin lesion of left leg; Lymphedema; Chronic knee pain (Secondary Area of Pain) (Bilateral) (R>L); Chronic thoracic back pain (Fourth Area of Pain) (Bilateral) (R>L); Chronic pain syndrome; Disorder of skeletal system; Other long term (current) drug therapy; Scoliosis; Asthma without status asthmaticus; Anemia; Acute diastolic heart failure ([place]); Neurogenic pain; Failed back surgical syndrome; Pharmacologic therapy; Problems influencing health status; Chronic lower extremity pain (Tertiary Area of Pain) (Bilateral) (R>L); Elevated C-reactive protein (CRP); Elevated sed rate; Vitamin D deficiency; Class 3 obesity with alveolar hypoventilation, serious comorbidity, and body mass index (BMI) of 60.0 to 69.9 in adult (HCC); ANA positive; Positive antinuclear antibody; Iron deficiency anemia; B12 deficiency; Goals of care, counseling/discussion; AIDS ([place]); and Long term current use of opiate analgesic on their problem list. Her primarily concern today is the Back Pain (right knee)  Pain Assessment: Location: Lower(right knee) Back Radiating: Pain radiates down back to leg, pain in knee stays inplace Onset: More  than a month ago Duration: Chronic pain Quality: Aching, Sharp, Constant Severity: 5 /10 (subjective, self-reported pain score)  Note: Reported level is compatible with observation.                          Effect on ADL: prolonging standing, limit my walking Timing: Constant Modifying factors: meds, pressure applied, moving BP: (!) 129/99  HR: 90  Tracey White was last scheduled for an appointment on 03/05/2017 for medication management. During today's appointment we reviewed Tracey White's chronic pain status, as well as her outpatient medication regimen.  The patient  reports that she does not use drugs. Her body mass index is 70.1 kg/m.  Further details on both, my assessment(s), as well as the proposed treatment plan, please see below.  Controlled Substance Pharmacotherapy Assessment REMS (Risk Evaluation and Mitigation Strategy)  Analgesic:Tramadol50 mg TID (Tramadol 150 mg per day) MME/day:82m/day.     BChauncey Fischer RN  06/02/2017  9:22 AM  Sign at close encounter Nursing Pain Medication Assessment:  Safety precautions to be maintained throughout the outpatient stay will include: orient to surroundings, keep bed in low position, maintain call bell within reach at all times, provide assistance with transfer out of bed and ambulation.  Medication Inspection Compliance: Pill count conducted under aseptic conditions, in front of the patient. Neither the pills nor the bottle was removed from the patient's sight at any time. Once count was completed pills were immediately returned to the patient in their original bottle.  Medication: Tramadol (Ultram) Pill/Patch Count: 53 of 90 pills remain Pill/Patch Appearance: Markings consistent with prescribed medication Bottle Appearance: Standard pharmacy container. Clearly labeled. Filled Date: 04 / 16 / 2019 Last Medication intake:  Yesterday   Pharmacokinetics: Liberation and  absorption (onset of action): WNL Distribution (time to peak  effect): WNL Metabolism and excretion (duration of action): WNL         Pharmacodynamics: Desired effects: Analgesia: Tracey White reports >50% benefit. Functional ability: Patient reports that medication allows her to accomplish basic ADLs Clinically meaningful improvement in function (CMIF): Sustained CMIF goals met Perceived effectiveness: Described as relatively effective, allowing for increase in activities of daily living (ADL) Undesirable effects: Side-effects or Adverse reactions: None reported Monitoring: [place] PMP: Online review of the past 77-monthperiod conducted. Compliant with practice rules and regulations Last UDS on record: Summary  Date Value Ref Range Status  12/12/2016 FINAL  Final    Comment:    ==================================================================== TOXAS[facility]RE COMP DRUG ANALYSIS,UR ==================================================================== Test                             Result       Flag       Units Drug Present and Declared for Prescription Verification   Alprazolam                     29           EXPECTED   ng/mg creat   Alpha-hydroxyalprazolam        47           EXPECTED   ng/mg creat    Source of alprazolam is a scheduled prescription medication.    Alpha-hydroxyalprazolam is an expected metabolite of alprazolam.   Gabapentin                     PRESENT      EXPECTED   Citalopram                     PRESENT      EXPECTED   Desmethylcitalopram            PRESENT      EXPECTED    Desmethylcitalopram is an expected metabolite of citalopram or    the enantiomeric form, escitalopram.   Aripiprazole                   PRESENT      EXPECTED   Diltiazem                      PRESENT      EXPECTED   Metoprolol                     PRESENT      EXPECTED Drug Present not Declared for Prescription Verification   Tramadol                       262          UNEXPECTED ng/mg creat   O-Desmethyltramadol            146          UNEXPECTED ng/mg creat     Source of tramadol is a prescription medication.    O-desmethyltramadol is an expected metabolite of tramadol.   Acetaminophen                  PRESENT      UNEXPECTED   Ibuprofen                      PRESENT  UNEXPECTED   Naproxen                       PRESENT      UNEXPECTED Drug Absent but Declared for Prescription Verification   Diclofenac                     Not Detected UNEXPECTED    Diclofenac, as indicated in the declared medication list, is not    always detected even when used as directed.   Lidocaine                      Not Detected UNEXPECTED    Lidocaine, as indicated in the declared medication list, is not    always detected even when used as directed. ==================================================================== Test                      Result    Flag   Units      Ref Range   Creatinine              118              mg/dL      >=20 ==================================================================== Declared Medications:  The flagging and interpretation on this report are based on the  following declared medications.  Unexpected results may arise from  inaccuracies in the declared medications.  **Note: The testing scope of this panel includes these medications:  Alprazolam (Xanax)  Aripiprazole (Abilify)  Diltiazem (Cardizem)  Escitalopram (Lexapro)  Gabapentin (Neurontin)  Metoprolol (Toprol)  **Note: The testing scope of this panel does not include small to  moderate amounts of these reported medications:  Diclofenac (Voltaren)  Lidocaine (Lidoderm)  **Note: The testing scope of this panel does not include following  reported medications:  Albuterol (Proventil)  Beclomethasone (QVAR)  Bumetanide (Bumex)  Losartan (Cozaar)  Montelukast (Singulair)  Potassium (K-Dur)  Potassium (Klor-Con) ==================================================================== For clinical consultation, please call [phone]. ====================================================================    UDS interpretation: Compliant          Medication Assessment Form: Reviewed. Patient indicates being compliant with therapy Treatment compliance: Compliant Risk Assessment Profile: Aberrant behavior: See prior evaluations. None observed or detected today Comorbid factors increasing risk of overdose: See prior notes. No additional risks detected today Risk of substance use disorder (SUD): Low  ORT Scoring interpretation table:  Score <3 = Low Risk for SUD  Score between 4-7 = Moderate Risk for SUD  Score >8 = High Risk for Opioid Abuse   Risk Mitigation Strategies:  Patient Counseling: Covered Patient-Prescriber Agreement (PPA): Present and active  Notification to other healthcare providers: Done  Pharmacologic Plan: No change in therapy, at this time.             Laboratory Chemistry  Inflammation Markers (CRP: Acute Phase) (ESR: Chronic Phase) Lab Results  Component Value Date   CRP 58.7 (H) 12/12/2016   ESRSEDRATE 59 (H) 12/12/2016                         Rheumatology Markers Lab Results  Component Value Date   RF <10.0 01/08/2017   ANA Positive (A) 01/08/2017                        Renal Function Markers Lab Results  Component Value Date   BUN 10 03/24/2017   CREATININE 0.74 03/24/2017   GFRAA >  60 03/24/2017   GFRNONAA >60 03/24/2017                              Hepatic Function Markers Lab Results  Component Value Date   AST 15 03/24/2017   ALT 11 (L) 03/24/2017   ALBUMIN 3.5 03/24/2017   ALKPHOS 110 03/24/2017                        Electrolytes Lab Results  Component Value Date   NA 134 (L) 03/24/2017   K 3.3 (L) 03/24/2017   CL 99 (L) 03/24/2017   CALCIUM 8.7 (L) 03/24/2017   MG 2.1 12/12/2016                        Neuropathy Markers Lab Results  Component Value Date   VITAMINB12 210 03/19/2017   FOLATE 7.0 03/19/2017   HGBA1C 6.1 (H) 05/31/2016   HIV Non  Reactive 12/25/2016                        Bone Pathology Markers Lab Results  Component Value Date   25OHVITD1 9.1 (L) 12/12/2016   25OHVITD2 3.6 12/12/2016   25OHVITD3 5.5 12/12/2016                         Coagulation Parameters Lab Results  Component Value Date   PLT 239 05/07/2017   DDIMER <0.27 12/24/2016                        Cardiovascular Markers Lab Results  Component Value Date   BNP 17.0 03/24/2017   TROPONINI <0.03 03/24/2017   HGB 10.7 (L) 05/07/2017   HCT 33.8 (L) 05/07/2017                         CA Markers No results found for: CEA, CA125, LABCA2                      Note: Lab results reviewed.  Recent Diagnostic Imaging Results  DG Chest 2 View CLINICAL DATA:  Shortness of breath and cough  EXAM: CHEST  2 VIEW  COMPARISON:  December 30, 2016  FINDINGS: There is no edema or consolidation. Heart size and pulmonary vascularity are normal. No adenopathy. Patient is status post fixation rod placement for lower thoracic dextroscoliosis.  IMPRESSION: No edema or consolidation.  Stable cardiac silhouette.  Electronically Signed   By: Lowella Grip III M.D.   On: 03/24/2017 13:14  Complexity Note: Imaging results reviewed. Results shared with Ms. Ridings, using Layman's terms.                         Meds   Current Outpatient Medications:  .  albuterol (PROVENTIL HFA;VENTOLIN HFA) 108 (90 Base) MCG/ACT inhaler, Inhale 2 puffs into the lungs every 4 (four) hours as needed for wheezing or shortness of breath., Disp: 1 Inhaler, Rfl: 1 .  ARIPiprazole (ABILIFY) 2 MG tablet, Take 10 mg by mouth daily. , Disp: , Rfl:  .  budesonide-formoterol (SYMBICORT) 160-4.5 MCG/ACT inhaler, Inhale 2 puffs into the lungs 2 (two) times daily., Disp: , Rfl:  .  bumetanide (BUMEX) 2 MG tablet, Take 2 mg by mouth 2 (two) times daily. , Disp: ,  Rfl:  .  buPROPion (WELLBUTRIN) 75 MG tablet, Take 150 mg by mouth daily. , Disp: , Rfl:  .  diclofenac sodium (VOLTAREN)  1 % GEL, Apply 4 (four) times daily topically., Disp: , Rfl:  .  diltiazem (CARDIZEM) 120 MG tablet, Take 120 mg daily by mouth., Disp: , Rfl:  .  escitalopram (LEXAPRO) 20 MG tablet, Take 20 mg by mouth daily. , Disp: , Rfl:  .  ferrous sulfate 325 (65 FE) MG tablet, Take 325 mg by mouth daily with breakfast., Disp: , Rfl:  .  lidocaine (LIDODERM) 5 %, Place 1 patch daily onto the skin. Remove & Discard patch within 12 hours or as directed by MD, Disp: , Rfl:  .  losartan (COZAAR) 25 MG tablet, Take 1 tablet (25 mg total) by mouth daily., Disp: 90 tablet, Rfl: 3 .  meloxicam (MOBIC) 15 MG tablet, Take 15 mg by mouth daily as needed for pain., Disp: , Rfl:  .  metoprolol succinate (TOPROL-XL) 50 MG 24 hr tablet, Take 1 tablet (50 mg total) by mouth daily. Take with or immediately following a meal., Disp: 90 tablet, Rfl: 3 .  montelukast (SINGULAIR) 10 MG tablet, Take 10 mg by mouth at bedtime., Disp: , Rfl:  .  norgestimate-ethinyl estradiol (SPRINTEC 28) 0.25-35 MG-MCG tablet, Take 1 tablet by mouth daily., Disp: , Rfl:  .  potassium chloride SA (K-DUR,KLOR-CON) 20 MEQ tablet, Take 40 mEq by mouth 2 (two) times daily., Disp: , Rfl:  .  [START ON 06/11/2017] traMADol (ULTRAM) 50 MG tablet, Take 1 tablet (50 mg total) by mouth every 8 (eight) hours as needed for severe pain., Disp: 90 tablet, Rfl: 2 .  Vitamin D, Ergocalciferol, (DRISDOL) 50000 units CAPS capsule, Take 50,000 Units by mouth every 7 (seven) days., Disp: , Rfl:  No current facility-administered medications for this visit.   Facility-Administered Medications Ordered in Other Visits:  .  technetium tetrofosmin (TC-MYOVIEW) injection 35.36 millicurie, 14.43 millicurie, Intravenous, Once PRN, [place], David A, MD  ROS  Constitutional: Denies any fever or chills Gastrointestinal: No reported hemesis, hematochezia, vomiting, or acute GI distress Musculoskeletal: Denies any acute onset joint swelling, redness, loss of ROM, or  weakness Neurological: No reported episodes of acute onset apraxia, aphasia, dysarthria, agnosia, amnesia, paralysis, loss of coordination, or loss of consciousness  Allergies  Ms. Santiesteban has No Known Allergies.  [place]  Drug: Ms. Manter  reports that she does not use drugs. Alcohol:  reports that she drinks about 0.6 - 1.2 oz of alcohol per week. Tobacco:  reports that she quit smoking about 3 months ago. Her smoking use included cigarettes. She smoked 0.20 packs per day. She has never used smokeless tobacco. Medical:  has a past medical history of Anxiety, CHF (congestive heart failure) ([place]), Chronic heart failure ([place]), Morbid obesity ([place]), OSA (obstructive sleep apnea), Oxygen deficiency (02/28/2017), Peripheral edema, and Scoliosis. Surgical: Ms. Kruczek  has a past surgical history that includes Tubal ligation; Hernia repair; and scoliosis repair. Family: family history includes Cancer in her maternal grandmother; Diabetes in her mother; Hypertension in her mother.  Constitutional Exam  General appearance: alert, cooperative, in no distress and morbidly obese Vitals:   06/02/17 0859  BP: (!) 129/99  Pulse: 90  Temp: 97.7 F (36.5 C)  SpO2: 99%  Weight: (!) 371 lb (168.3 kg)  Height: 5' 1"  (1.549 m)  Psych/Mental status: Alert, oriented x 3 (person, place, & time)       Eyes: PERLA Respiratory: Oxygen-dependent weith  increased activty  Cervical Spine Area Exam  Skin & Axial Inspection: No masses, redness, edema, swelling, or associated skin lesions Alignment: Symmetrical Functional ROM: Unrestricted ROM      Stability: No instability detected Muscle Tone/Strength: Functionally intact. No obvious neuro-muscular anomalies detected. Sensory (Neurological): Unimpaired Palpation: No palpable anomalies              Upper Extremity (UE) Exam    Side: Right upper extremity  Side: Left upper extremity  Skin & Extremity Inspection: Skin color, temperature, and hair growth are WNL. No  peripheral edema or cyanosis. No masses, redness, swelling, asymmetry, or associated skin lesions. No contractures.  Skin & Extremity Inspection: Skin color, temperature, and hair growth are WNL. No peripheral edema or cyanosis. No masses, redness, swelling, asymmetry, or associated skin lesions. No contractures.  Functional ROM: Unrestricted ROM          Functional ROM: Unrestricted ROM          Muscle Tone/Strength: Functionally intact. No obvious neuro-muscular anomalies detected.  Muscle Tone/Strength: Functionally intact. No obvious neuro-muscular anomalies detected.  Sensory (Neurological): Unimpaired          Sensory (Neurological): Unimpaired          Palpation: No palpable anomalies              Palpation: No palpable anomalies              Specialized Test(s): Deferred         Specialized Test(s): Deferred          Thoracic Spine Area Exam  Skin & Axial Inspection: No masses, redness, or swelling Alignment: Symmetrical Functional ROM: Unrestricted ROM Stability: No instability detected Muscle Tone/Strength: Functionally intact. No obvious neuro-muscular anomalies detected. Sensory (Neurological): Unimpaired Muscle strength & Tone: No palpable anomalies  Lumbar Spine Area Exam  Skin & Axial Inspection: No masses, redness, or swelling Alignment: Symmetrical Functional ROM: Unrestricted ROM       Stability: No instability detected Muscle Tone/Strength: Functionally intact. No obvious neuro-muscular anomalies detected. Sensory (Neurological): Unimpaired Palpation: No palpable anomalies       Provocative Tests: Lumbar Hyperextension and rotation test: evaluation deferred today       Lumbar Lateral bending test: evaluation deferred today       Patrick's Maneuver: evaluation deferred today                    Gait & Posture Assessment  Ambulation: Unassisted Gait: Relatively normal for age and body habitus Posture: WNL   Lower Extremity Exam    Side: Right lower extremity  Side:  Left lower extremity  Stability: No instability observed          Stability: No instability observed          Skin & Extremity Inspection: crepitus  Skin & Extremity Inspection: Skin color, temperature, and hair growth are WNL. No peripheral edema or cyanosis. No masses, redness, swelling, asymmetry, or associated skin lesions. No contractures.  Functional ROM: Decreased ROM                  Functional ROM: Unrestricted ROM                  Muscle Tone/Strength: Functionally intact. No obvious neuro-muscular anomalies detected.  Muscle Tone/Strength: Functionally intact. No obvious neuro-muscular anomalies detected.  Sensory (Neurological): Unimpaired  Sensory (Neurological): Unimpaired  Palpation: No palpable anomalies  Palpation: No palpable anomalies   Assessment  Primary Diagnosis &  Pertinent Problem List: The primary encounter diagnosis was Chronic knee pain (Secondary Area of Pain) (Bilateral) (R>L). Diagnoses of Chronic lower extremity pain (Tertiary Area of Pain) (Bilateral) (R>L), Chronic low back pain (Primary Area of Pain) (Bilateral) (L>R), Chronic pain syndrome, and Long term current use of opiate analgesic were also pertinent to this visit.  Status Diagnosis  Worsening Persistent Persistent 1. Chronic knee pain (Secondary Area of Pain) (Bilateral) (R>L)   2. Chronic lower extremity pain (Tertiary Area of Pain) (Bilateral) (R>L)   3. Chronic low back pain (Primary Area of Pain) (Bilateral) (L>R)   4. Chronic pain syndrome   5. Long term current use of opiate analgesic     Problems updated and reviewed during this visit: Problem  Long Term Current Use of Opiate Analgesic   Plan of Care  Pharmacotherapy (Medications Ordered): Meds ordered this encounter  Medications  . traMADol (ULTRAM) 50 MG tablet    Sig: Take 1 tablet (50 mg total) by mouth every 8 (eight) hours as needed for severe pain.    Dispense:  90 tablet    Refill:  2    Do not place this medication, or any  other prescription from our practice, on "Automatic Refill". Patient may have prescription filled one day early if pharmacy is closed on scheduled refill date. Do not fill until: 06/11/2017 To last until: 09/09/2017    Order Specific Question:   Supervising Provider    Answer:   Milinda Pointer [phone]   New Prescriptions   No medications on file   Medications administered today: Carnell A. Touhey had no medications administered during this visit. Lab-work, procedure(s), and/or referral(s): Orders Placed This Encounter  Procedures  . KNEE INJECTION  . ToxASSURE Select 13 (MW), Urine   Imaging and/or referral(s): None  Planned, scheduled, and/or pending:  Bilateral Hyalgan series   Considering: Diagnostic bilateral LESI Diagnostic bilateral lumbar facet nerve block Possible bilateral lumbar facet RFA Bilateral Hyalgan series Diagnostic bilateral intra-articular knee injection Diagnosticbilateral genicular nerve block Possible bilateral genicular RFA   PRN Procedures: None at this time      Provider-requested follow-up: Return in about 3 months (around 09/02/2017) for MedMgmt with Me Dionisio David), in addition, Procedure(NS), w/ Dr. Holley Raring.  Future Appointments  Date Time Provider [place]  06/11/2017 10:30 AM Gillis Santa, MD [facility] None  06/25/2017  8:15 AM [facility] LAB [facility] None  06/25/2017  8:30 AM [facility] INJECTION [facility] None  07/22/2017 11:00 AM Alisa Graff, FNP [facility] None  07/23/2017  8:30 AM [facility] INJECTION [facility] None  08/19/2017  8:30 AM [facility] LAB [facility] None  08/20/2017  8:30 AM [facility] INJECTION [facility] None  08/20/2017  8:45 AM Lequita Asal, MD [facility] None  09/02/2017  8:45 AM Vevelyn Francois, NP [facility] None  09/17/2017  8:30 AM [facility] INJECTION [facility] None  10/15/2017  8:30 AM [facility] INJECTION [facility] None  11/12/2017  8:30 AM [facility] INJECTION [facility] None   Primary Care Physician:  Zeb Comfort, MD Location: [facility] Note by: Vevelyn Francois NP Date: 06/02/2017; Time: 10:19 AM  Pain Score Disclaimer: We use the NRS-11 scale. This is a self-reported, subjective measurement of pain severity with only modest accuracy. It is used primarily to identify changes within a particular patient. It must be understood that outpatient pain scales are significantly less accurate that those used for research, where they can be applied under ideal controlled circumstances with minimal exposure to variables. In reality, the score is  likely to be a combination of pain intensity and pain affect, where pain affect describes the degree of emotional arousal or changes in action readiness caused by the sensory experience of pain. Factors such as social and work situation, setting, emotional state, anxiety levels, expectation, and prior pain experience may influence pain perception and show large inter-individual differences that may also be affected by time variables.  Patient instructions provided during this appointment: Patient Instructions   ____________________________________________________________________________________________  Medication Rules  Applies to: All patients receiving prescriptions (written or electronic).  Pharmacy of record: Pharmacy where electronic prescriptions will be sent. If written prescriptions are taken to a different pharmacy, please inform the nursing staff. The pharmacy listed in the electronic medical record should be the one where you would like electronic prescriptions to be sent.  Prescription refills: Only during scheduled appointments. Applies to both, written and electronic prescriptions.  NOTE: The following applies primarily to controlled substances (Opioid* Pain Medications).   Patient's responsibilities: 1. Pain Pills: Bring all pain pills to every appointment (except for procedure appointments). 2. Pill  Bottles: Bring pills in original pharmacy bottle. Always bring newest bottle. Bring bottle, even if empty. 3. Medication refills: You are responsible for knowing and keeping track of what medications you need refilled. The day before your appointment, write a list of all prescriptions that need to be refilled. Bring that list to your appointment and give it to the admitting nurse. Prescriptions will be written only during appointments. If you forget a medication, it will not be "Called in", "Faxed", or "electronically sent". You will need to get another appointment to get these prescribed. 4. Prescription Accuracy: You are responsible for carefully inspecting your prescriptions before leaving our office. Have the discharge nurse carefully go over each prescription with you, before taking them home. Make sure that your name is accurately spelled, that your address is correct. Check the name and dose of your medication to make sure it is accurate. Check the number of pills, and the written instructions to make sure they are clear and accurate. Make sure that you are given enough medication to last until your next medication refill appointment. 5. Taking Medication: Take medication as prescribed. Never take more pills than instructed. Never take medication more frequently than prescribed. Taking less pills or less frequently is permitted and encouraged, when it comes to controlled substances (written prescriptions).  6. Inform other Doctors: Always inform, all of your healthcare providers, of all the medications you take. 7. Pain Medication from other Providers: You are not allowed to accept any additional pain medication from any other Doctor or Healthcare provider. There are two exceptions to this rule. (see below) In the event that you require additional pain medication, you are responsible for notifying us, as stated below. 8. Medication Agreement: You are responsible for carefully reading and following our  Medication Agreement. This must be signed before receiving any prescriptions from our practice. Safely store a copy of your signed Agreement. Violations to the Agreement will result in no further prescriptions. (Additional copies of our Medication Agreement are available upon request.) 9. Laws, Rules, & Regulations: All patients are expected to follow all Federal and [employer], [employer], Rules, [employer] [facility]. Ignorance of the Laws does not constitute a valid excuse. The use of any illegal substances is prohibited. 10. Adopted CDC guidelines & recommendations: Target dosing levels will be at or below 60 MME/day. Use of benzodiazepines** is not recommended.  Exceptions: There are only two exceptions to the rule of  not receiving pain medications from other Healthcare Providers. 1. Exception #1 (Emergencies): In the event of an emergency (i.e.: accident requiring emergency care), you are allowed to receive additional pain medication. However, you are responsible for: As soon as you are able, call our office [phone], at any time of the day or night, and leave a message stating your name, the date and nature of the emergency, and the name and dose of the medication prescribed. In the event that your call is answered by a member of our staff, make sure to document and save the date, time, and the name of the person that took your information.  2. Exception #2 (Planned Surgery): In the event that you are scheduled by another doctor or dentist to have any type of surgery or procedure, you are allowed (for a period no longer than 30 days), to receive additional pain medication, for the acute post-op pain. However, in this case, you are responsible for picking up a copy of our "Post-op Pain Management for Surgeons" handout, and giving it to your surgeon or dentist. This document is available at our office, and does not require an appointment to obtain it. Simply go to our office during business hours  (Monday-Thursday from 8:00 AM to 4:00 PM) (Friday 8:00 AM to 12:00 Noon) or if you have a scheduled appointment with [place], prior to your surgery, and ask for it by name. In addition, you will need to provide [place] with your name, name of your surgeon, type of surgery, and date of procedure or surgery.  *Opioid medications include: morphine, codeine, oxycodone, oxymorphone, hydrocodone, hydromorphone, meperidine, tramadol, tapentadol, buprenorphine, fentanyl, methadone. **Benzodiazepine medications include: diazepam (Valium), alprazolam (Xanax), clonazepam (Klonopine), lorazepam (Ativan), clorazepate (Tranxene), chlordiazepoxide (Librium), estazolam (Prosom), oxazepam (Serax), temazepam (Restoril), triazolam (Halcion) (Last updated: 03/27/2017) ____________________________________________________________________________________________   BMI Assessment: Estimated body mass index is 70.1 kg/m as calculated from the following:   Height as of this encounter: 5' 1"  (1.549 m).   Weight as of this encounter: 371 lb (168.3 kg).  BMI interpretation table: BMI level Category Range association with higher incidence of chronic pain  <18 kg/m2 Underweight   18.5-24.9 kg/m2 Ideal body weight   25-29.9 kg/m2 Overweight Increased incidence by 20%  30-34.9 kg/m2 Obese (Class I) Increased incidence by 68%  35-39.9 kg/m2 Severe obesity (Class II) Increased incidence by 136%  >40 kg/m2 Extreme obesity (Class III) Increased incidence by 254%   Patient's current BMI Ideal Body weight  Body mass index is 70.1 kg/m. Ideal body weight: 47.8 kg (105 lb 6.1 oz) Adjusted ideal body weight: 96 kg (211 lb 10 oz)   BMI Readings from Last 4 Encounters:  06/02/17 70.10 kg/m  05/07/17 68.65 kg/m  04/16/17 67.48 kg/m  04/14/17 66.34 kg/m   Wt Readings from Last 4 Encounters:  06/02/17 (!) 371 lb (168.3 kg)  05/07/17 (!) 363 lb 5.1 oz (164.8 kg)  04/16/17 (!) 357 lb 2.3 oz (162 kg)  04/14/17 (!) 351 lb 2 oz (159.3  kg)   ____________________________________________________________________________________________  Preparing for your procedure (without sedation)  Instructions: . Oral Intake: Do not eat or drink anything for at least 3 hours prior to your procedure. . Transportation: Unless otherwise stated by your physician, you may drive yourself after the procedure. . Blood Pressure Medicine: Take your blood pressure medicine with a sip of water the morning of the procedure. . Blood thinners:  . Diabetics on insulin: Notify the staff so that you can be scheduled 1st case in  the morning. If your diabetes requires high dose insulin, take only  of your normal insulin dose the morning of the procedure and notify the staff that you have done so. . Preventing infections: Shower with an antibacterial soap the morning of your procedure.  . Build-up your immune system: Take 1000 mg of Vitamin C with every meal (3 times a day) the day prior to your procedure. Marland Kitchen Antibiotics: Inform the staff if you have a condition or reason that requires you to take antibiotics before dental procedures. . Pregnancy: If you are pregnant, call and cancel the procedure. . Sickness: If you have a cold, fever, or any active infections, call and cancel the procedure. . Arrival: You must be in the facility at least 30 minutes prior to your scheduled procedure. . Children: Do not bring any children with you. . Dress appropriately: Bring dark clothing that you would not mind if they get stained. . Valuables: Do not bring any jewelry or valuables.  Procedure appointments are reserved for interventional treatments only. Marland Kitchen No Prescription Refills. . No medication changes will be discussed during procedure appointments. . No disability issues will be discussed.  Remember:  Regular Business hours are:  Monday to Thursday 8:00 AM to 4:00 PM  Provider's Schedule: Milinda Pointer, MD:  Procedure days: Tuesday and Thursday 7:30 AM to  4:00 PM  Gillis Santa, MD:  Procedure days: Monday and Wednesday 7:30 AM to 4:00 PM ____________________________________________________________________________________________

## 2017-06-02 NOTE — Patient Instructions (Addendum)
____________________________________________________________________________________________  Medication Rules  Applies to: All patients receiving prescriptions (written or electronic).  Pharmacy of record: Pharmacy where electronic prescriptions will be sent. If written prescriptions are taken to a different pharmacy, please inform the nursing staff. The pharmacy listed in the electronic medical record should be the one where you would like electronic prescriptions to be sent.  Prescription refills: Only during scheduled appointments. Applies to both, written and electronic prescriptions.  NOTE: The following applies primarily to controlled substances (Opioid* Pain Medications).   Patient's responsibilities: 1. Pain Pills: Bring all pain pills to every appointment (except for procedure appointments). 2. Pill Bottles: Bring pills in original pharmacy bottle. Always bring newest bottle. Bring bottle, even if empty. 3. Medication refills: You are responsible for knowing and keeping track of what medications you need refilled. The day before your appointment, write a list of all prescriptions that need to be refilled. Bring that list to your appointment and give it to the admitting nurse. Prescriptions will be written only during appointments. If you forget a medication, it will not be "Called in", "Faxed", or "electronically sent". You will need to get another appointment to get these prescribed. 4. Prescription Accuracy: You are responsible for carefully inspecting your prescriptions before leaving our office. Have the discharge nurse carefully go over each prescription with you, before taking them home. Make sure that your name is accurately spelled, that your address is correct. Check the name and dose of your medication to make sure it is accurate. Check the number of pills, and the written instructions to make sure they are clear and accurate. Make sure that you are given enough medication to last  until your next medication refill appointment. 5. Taking Medication: Take medication as prescribed. Never take more pills than instructed. Never take medication more frequently than prescribed. Taking less pills or less frequently is permitted and encouraged, when it comes to controlled substances (written prescriptions).  6. Inform other Doctors: Always inform, all of your healthcare providers, of all the medications you take. 7. Pain Medication from other Providers: You are not allowed to accept any additional pain medication from any other Doctor or Healthcare provider. There are two exceptions to this rule. (see below) In the event that you require additional pain medication, you are responsible for notifying us, as stated below. 8. Medication Agreement: You are responsible for carefully reading and following our Medication Agreement. This must be signed before receiving any prescriptions from our practice. Safely store a copy of your signed Agreement. Violations to the Agreement will result in no further prescriptions. (Additional copies of our Medication Agreement are available upon request.) 9. Laws, Rules, & Regulations: All patients are expected to follow all Federal and State Laws, Statutes, Rules, & Regulations. Ignorance of the Laws does not constitute a valid excuse. The use of any illegal substances is prohibited. 10. Adopted CDC guidelines & recommendations: Target dosing levels will be at or below 60 MME/day. Use of benzodiazepines** is not recommended.  Exceptions: There are only two exceptions to the rule of not receiving pain medications from other Healthcare Providers. 1. Exception #1 (Emergencies): In the event of an emergency (i.e.: accident requiring emergency care), you are allowed to receive additional pain medication. However, you are responsible for: As soon as you are able, call our office [phone], at any time of the day or night, and leave a message stating your name, the  date and nature of the emergency, and the name and dose of the medication   prescribed. In the event that your call is answered by a member of our staff, make sure to document and save the date, time, and the name of the person that took your information.  2. Exception #2 (Planned Surgery): In the event that you are scheduled by another doctor or dentist to have any type of surgery or procedure, you are allowed (for a period no longer than 30 days), to receive additional pain medication, for the acute post-op pain. However, in this case, you are responsible for picking up a copy of our "Post-op Pain Management for Surgeons" handout, and giving it to your surgeon or dentist. This document is available at our office, and does not require an appointment to obtain it. Simply go to our office during business hours (Monday-Thursday from 8:00 AM to 4:00 PM) (Friday 8:00 AM to 12:00 Noon) or if you have a scheduled appointment with [place], prior to your surgery, and ask for it by name. In addition, you will need to provide [place] with your name, name of your surgeon, type of surgery, and date of procedure or surgery.  *Opioid medications include: morphine, codeine, oxycodone, oxymorphone, hydrocodone, hydromorphone, meperidine, tramadol, tapentadol, buprenorphine, fentanyl, methadone. **Benzodiazepine medications include: diazepam (Valium), alprazolam (Xanax), clonazepam (Klonopine), lorazepam (Ativan), clorazepate (Tranxene), chlordiazepoxide (Librium), estazolam (Prosom), oxazepam (Serax), temazepam (Restoril), triazolam (Halcion) (Last updated: 03/27/2017) ____________________________________________________________________________________________   BMI Assessment: Estimated body mass index is 70.1 kg/m as calculated from the following:   Height as of this encounter:  (1.549 m).   Weight as of this encounter: 371 lb (168.3 kg).  BMI interpretation table: BMI level Category Range association with higher  incidence of chronic pain  <18 kg/m2 Underweight   18.5-24.9 kg/m2 Ideal body weight   25-29.9 kg/m2 Overweight Increased incidence by 20%  30-34.9 kg/m2 Obese (Class I) Increased incidence by 68%  35-39.9 kg/m2 Severe obesity (Class II) Increased incidence by 136%  >40 kg/m2 Extreme obesity (Class III) Increased incidence by 254%   Patient's current BMI Ideal Body weight  Body mass index is 70.1 kg/m. Ideal body weight: 47.8 kg (105 lb 6.1 oz) Adjusted ideal body weight: 96 kg (211 lb 10 oz)   BMI Readings from Last 4 Encounters:  06/02/17 70.10 kg/m  05/07/17 68.65 kg/m  04/16/17 67.48 kg/m  04/14/17 66.34 kg/m   Wt Readings from Last 4 Encounters:  06/02/17 (!) 371 lb (168.3 kg)  05/07/17 (!) 363 lb 5.1 oz (164.8 kg)  04/16/17 (!) 357 lb 2.3 oz (162 kg)  04/14/17 (!) 351 lb 2 oz (159.3 kg)   ____________________________________________________________________________________________  Preparing for your procedure (without sedation)  Instructions: . Oral Intake: Do not eat or drink anything for at least 3 hours prior to your procedure. . Transportation: Unless otherwise stated by your physician, you may drive yourself after the procedure. . Blood Pressure Medicine: Take your blood pressure medicine with a sip of water the morning of the procedure. . Blood thinners:  . Diabetics on insulin: Notify the staff so that you can be scheduled 1st case in the morning. If your diabetes requires high dose insulin, take only  of your normal insulin dose the morning of the procedure and notify the staff that you have done so. . Preventing infections: Shower with an antibacterial soap the morning of your procedure.  . Build-up your immune system: Take 1000 mg of Vitamin C with every meal (3 times a day) the day prior to your procedure. Marland Kitchen Antibiotics: Inform the staff if you have a  condition or reason that requires you to take antibiotics before dental procedures. . Pregnancy: If you are  pregnant, call and cancel the procedure. . Sickness: If you have a cold, fever, or any active infections, call and cancel the procedure. . Arrival: You must be in the facility at least 30 minutes prior to your scheduled procedure. . Children: Do not bring any children with you. . Dress appropriately: Bring dark clothing that you would not mind if they get stained. . Valuables: Do not bring any jewelry or valuables.  Procedure appointments are reserved for interventional treatments only. Marland Kitchen No Prescription Refills. . No medication changes will be discussed during procedure appointments. . No disability issues will be discussed.  Remember:  Regular Business hours are:  Monday to Thursday 8:00 AM to 4:00 PM  Provider's Schedule: Delano Metz, MD:  Procedure days: Tuesday and Thursday 7:30 AM to 4:00 PM  Edward Jolly, MD:  Procedure days: Monday and Wednesday 7:30 AM to 4:00 PM ____________________________________________________________________________________________

## 2017-06-02 NOTE — Progress Notes (Signed)
Nursing Pain Medication Assessment:  Safety precautions to be maintained throughout the outpatient stay will include: orient to surroundings, keep bed in low position, maintain call bell within reach at all times, provide assistance with transfer out of bed and ambulation.  Medication Inspection Compliance: Pill count conducted under aseptic conditions, in front of the patient. Neither the pills nor the bottle was removed from the patient's sight at any time. Once count was completed pills were immediately returned to the patient in their original bottle.  Medication: Tramadol (Ultram) Pill/Patch Count: 53 of 90 pills remain Pill/Patch Appearance: Markings consistent with prescribed medication Bottle Appearance: Standard pharmacy container. Clearly labeled. Filled Date: 04 / 16 / 2019 Last Medication intake:  Yesterday

## 2017-06-06 LAB — TOXASSURE SELECT 13 (MW), URINE

## 2017-06-11 ENCOUNTER — Encounter: Payer: Self-pay | Admitting: Student in an Organized Health Care Education/Training Program

## 2017-06-11 ENCOUNTER — Ambulatory Visit
Payer: Medicaid Other | Attending: Student in an Organized Health Care Education/Training Program | Admitting: Student in an Organized Health Care Education/Training Program

## 2017-06-11 ENCOUNTER — Other Ambulatory Visit: Payer: Self-pay

## 2017-06-11 VITALS — BP 114/49 | HR 96 | Temp 98.2°F | Resp 18 | Ht 61.0 in | Wt 368.0 lb

## 2017-06-11 DIAGNOSIS — Z9889 Other specified postprocedural states: Secondary | ICD-10-CM | POA: Diagnosis not present

## 2017-06-11 DIAGNOSIS — Z9851 Tubal ligation status: Secondary | ICD-10-CM | POA: Insufficient documentation

## 2017-06-11 DIAGNOSIS — M25561 Pain in right knee: Secondary | ICD-10-CM | POA: Insufficient documentation

## 2017-06-11 DIAGNOSIS — M25562 Pain in left knee: Secondary | ICD-10-CM | POA: Diagnosis not present

## 2017-06-11 DIAGNOSIS — G8929 Other chronic pain: Secondary | ICD-10-CM | POA: Diagnosis not present

## 2017-06-11 MED ORDER — SODIUM HYALURONATE (VISCOSUP) 20 MG/2ML IX SOSY
2.0000 mL | PREFILLED_SYRINGE | Freq: Once | INTRA_ARTICULAR | Status: AC
  Administered 2017-06-11: 12:00:00 via INTRA_ARTICULAR

## 2017-06-11 MED ORDER — LIDOCAINE HCL (PF) 1 % IJ SOLN
INTRAMUSCULAR | Status: AC
  Filled 2017-06-11: qty 10

## 2017-06-11 MED ORDER — LIDOCAINE HCL (PF) 1 % IJ SOLN
5.0000 mL | Freq: Once | INTRAMUSCULAR | Status: AC
  Administered 2017-06-11: 5 mL

## 2017-06-11 NOTE — Patient Instructions (Signed)
Post-Injection Inflammatory Reaction A post-injection inflammatory reaction is swelling, irritation, and other problems that can develop after a person gets a shot (injection). The reaction can develop at and around the injection site or far away from the injection site. In some cases, it can develop right away and last for a short period of time. In other cases, it can develop weeks after the injection and last for several hours or days. What are the causes? This condition may be caused by:  An allergy.  A response by your body's defense system (immune response).  Damage to the surrounding tissue from the injection.  Germs that enter the body through the injection site.  What are the signs or symptoms? Symptoms of this condition include:  Itchiness.  Redness.  Rash.  Warmth.  Swelling.  Tenderness.  Pain.  Fever or chills.  Muscle aches.  Nausea or vomiting.  Loss of appetite.  Headache.  Dizziness.  In severe cases, seizures, asthma, or a severe allergic reaction (anaphylaxis) can occur. These cases are rare. How is this diagnosed? This condition may be diagnosed with a physical exam. During the exam, a circle may be drawn around the injection site. The circle helps to show whether redness in the area is spreading. How is this treated? Treatment for this condition depends on what caused the reaction and how severe the reaction is. Treatments commonly involves:  Putting an ice pack over the injection site.  Taking a nonsteroidal anti-inflammatory drug (NSAID) to reduce swelling and itching.  Taking an antibiotic medicine.  Taking pain medicine.  If the reaction affects a joint, you may also need to rest that joint for a while. Follow these instructions at home:  Keep the injection site clean.  Take over-the-counter and prescription medicines only as told by your health care provider.  If you were prescribed antibiotic medicine, take or apply it as told  by your health care provider. Do not stop taking or applying the antibiotic even if your condition improves.  If directed, apply ice to the injured area: ? Put ice in a plastic bag. ? Place a towel between your skin and the bag. ? Leave the ice on for 20 minutes, 2-3 times per day.  If the reaction affects a joint, rest your joint. Ask your health care provider when you can start to use your joint again.  Do not drive or operate heavy machinery while taking prescription pain medicine.  Keep all follow-up visits as told by your health care provider. This is important. Contact a health care provider if:  Your symptoms last for several hours.  You develop a fever or chills.  You develop muscle aches. Get help right away if:  Your symptoms get worse.  You have trouble breathing.  You have seizures. This information is not intended to replace advice given to you by your health care provider. Make sure you discuss any questions you have with your health care provider. Document Released: 09/26/2010 Document Revised: 06/22/2015 Document Reviewed: 07/27/2014 Elsevier Interactive Patient Education  2018 [employer]. Sodium Hyaluronate intra-articular injection What is this medicine? SODIUM HYALURONATE (SOE dee um hye al yoor ON ate) is used to treat pain in the knee due to osteoarthritis. This medicine may be used for other purposes; ask your health care provider or pharmacist if you have questions. COMMON BRAND NAME(S): Amvisc, DUROLANE, Euflexxa, GELSYN-3, Hyalgan, Monovisc, Orthovisc, Supartz, Supartz FX What should I tell my health care provider before I take this medicine? They need to  know if you have any of these conditions: -bleeding disorders -glaucoma -infection in the knee joint -skin conditions or sensitivity -skin infection -an unusual allergic reaction to sodium hyaluronate, other medicines, foods, dyes, or preservatives. Different brands of sodium hyaluronate contain  different allergens. Some may contain egg. Talk to your doctor about your allergies to make sure that you get the right product. -pregnant or trying to get pregnant -breast-feeding How should I use this medicine? This medicine is for injection into the knee joint. It is given by a health care professional in a hospital or clinic setting. Talk to your pediatrician regarding the use of this medicine in children. Special care may be needed. Overdosage: If you think you have taken too much of this medicine contact a poison control center or emergency room at once. NOTE: This medicine is only for you. Do not share this medicine with others. What if I miss a dose? This does not apply. What may interact with this medicine? Interactions are not expected. This list may not describe all possible interactions. Give your health care provider a list of all the medicines, herbs, non-prescription drugs, or dietary supplements you use. Also tell them if you smoke, drink alcohol, or use illegal drugs. Some items may interact with your medicine. What should I watch for while using this medicine? Tell your doctor or healthcare professional if your symptoms do not start to get better or if they get worse. If receiving this medicine for osteoarthritis, limit your activity after you receive your injection. Avoid physical activity for 48 hours following your injection to keep your knee from swelling. Do not stand on your feet for more than 1 hour at a time during the first 48 hours following your injection. Ask your doctor or healthcare professional about when you can begin major physical activity again. What side effects may I notice from receiving this medicine? Side effects that you should report to your doctor or health care professional as soon as possible: -allergic reactions like skin rash, itching or hives, swelling of the face, lips, or tongue -dizziness -facial flushing -pain, tingling, numbness in the hands  or feet -vision changes if received this medicine during eye surgery Side effects that usually do not require medical attention (report to your doctor or health care professional if they continue or are bothersome): -back pain -bruising at site where injected -chills -diarrhea -fever -headache -joint pain -joint stiffness -joint swelling -muscle cramps -muscle pain -nausea, vomiting -pain, redness, or irritation at site where injected -weak or tired This list may not describe all possible side effects. Call your doctor for medical advice about side effects. You may report side effects to FDA at 1-800-FDA-1088. Where should I keep my medicine? This drug is given in a hospital or clinic and will not be stored at home. NOTE: This sheet is a summary. It may not cover all possible information. If you have questions about this medicine, talk to your doctor, pharmacist, or health care provider.  2018 Elsevier/Gold Standard (2015-02-16 08:34:51)

## 2017-06-11 NOTE — Progress Notes (Signed)
Safety precautions to be maintained throughout the outpatient stay will include: orient to surroundings, keep bed in low position, maintain call bell within reach at all times, provide assistance with transfer out of bed and ambulation.  

## 2017-06-11 NOTE — Progress Notes (Signed)
Patient's Name: Tracey White  MRN: [id number]  Referring Provider: Lazarus Gowda, MD  DOB: [birth year]  PCP: Lazarus Gowda, MD  DOS: 06/11/2017  Note by: Edward Jolly, MD  Service setting: Ambulatory outpatient  Specialty: Interventional Pain Management  Patient type: Established  Location: [facility] (AMB) Pain Management Facility  Visit type: Interventional Procedure   Primary Reason for Visit: Interventional Pain Management Treatment. CC: Knee Pain (bilaterally)  Procedure:  Anesthesia, Analgesia, Anxiolysis:  Type: Diagnostic Intra-Articular Hyalgan Knee Injection #1  Region: Medial infrapatellar Knee Region Level: Knee Joint Laterality: Bilateral  Type: Local Anesthesia Indication(s): Analgesia         Local Anesthetic: Lidocaine 1-2% Route: Infiltration (/IM) IV Access: Declined Sedation: Declined    Indications: 1. Chronic knee pain (Secondary Area of Pain) (Bilateral) (R>L)    Pain Score: Pre-procedure: 7 /10 Post-procedure: 0-No pain/10  Pre-op Assessment:  Tracey White is a 37 y.o. (year old), female patient, seen today for interventional treatment. She  has a past surgical history that includes Tubal ligation; Hernia repair; and scoliosis repair. Tracey White has a current medication list which includes the following prescription(s): albuterol, aripiprazole, budesonide-formoterol, bumetanide, bupropion, diclofenac sodium, diltiazem, escitalopram, ferrous sulfate, lidocaine, losartan, meloxicam, metoprolol succinate, montelukast, norgestimate-ethinyl estradiol, potassium chloride sa, tramadol, and vitamin d (ergocalciferol), and the following Facility-Administered Medications: technetium tetrofosmin. Her primarily concern today is the Knee Pain (bilaterally)  Initial Vital Signs:  Pulse/HCG Rate: 99  Temp: 98.3 F (36.8 C) Resp: 18 BP: 115/85 SpO2: 96 %(on room air; pt states normally on 3L O2 if walking disatnce)  BMI: Estimated body mass index is 69.53 kg/m as  calculated from the following:   Height as of this encounter:  (1.549 m).   Weight as of this encounter: 368 lb (166.9 kg).  Risk Assessment: Allergies: Reviewed. She has No Known Allergies.  Allergy Precautions: None required Coagulopathies: Reviewed. None identified.  Blood-thinner therapy: None at this time Active Infection(s): Reviewed. None identified. Tracey White is afebrile  Site Confirmation: Tracey White was asked to confirm the procedure and laterality before marking the site Procedure checklist: Completed Consent: Before the procedure and under the influence of no sedative(s), amnesic(s), or anxiolytics, the patient was informed of the treatment options, risks and possible complications. To fulfill our ethical and legal obligations, as recommended by the American [employer] Association's Code of Ethics, I have informed the patient of my clinical impression; the nature and purpose of the treatment or procedure; the risks, benefits, and possible complications of the intervention; the alternatives, including doing nothing; the risk(s) and benefit(s) of the alternative treatment(s) or procedure(s); and the risk(s) and benefit(s) of doing nothing. The patient was provided information about the general risks and possible complications associated with the procedure. These may include, but are not limited to: failure to achieve desired goals, infection, bleeding, organ or nerve damage, allergic reactions, paralysis, and death. In addition, the patient was informed of those risks and complications associated to the procedure, such as failure to decrease pain; infection; bleeding; organ or nerve damage with subsequent damage to sensory, motor, and/or autonomic systems, resulting in permanent pain, numbness, and/or weakness of one or several areas of the body; allergic reactions; (i.e.: anaphylactic reaction); and/or death. Furthermore, the patient was informed of those risks and complications  associated with the medications. These include, but are not limited to: allergic reactions (i.e.: anaphylactic or anaphylactoid reaction(s)); adrenal axis suppression; blood sugar elevation that in diabetics may result in ketoacidosis or comma; water retention  that in patients with history of congestive heart failure may result in shortness of breath, pulmonary edema, and decompensation with resultant heart failure; weight gain; swelling or edema; medication-induced neural toxicity; particulate matter embolism and blood vessel occlusion with resultant organ, and/or nervous system infarction; and/or aseptic necrosis of one or more joints. Finally, the patient was informed that Medicine is not an exact science; therefore, there is also the possibility of unforeseen or unpredictable risks and/or possible complications that may result in a catastrophic outcome. The patient indicated having understood very clearly. We have given the patient no guarantees and we have made no promises. Enough time was given to the patient to ask questions, all of which were answered to the patient's satisfaction. Tracey White has indicated that she wanted to continue with the procedure. Attestation: I, the ordering provider, attest that I have discussed with the patient the benefits, risks, side-effects, alternatives, likelihood of achieving goals, and potential problems during recovery for the procedure that I have provided informed consent. Date  Time: 06/11/2017 10:49 AM  Pre-Procedure Preparation:  Monitoring: As per clinic protocol. Respiration, ETCO2, SpO2, BP, heart rate and rhythm monitor placed and checked for adequate function Safety Precautions: Patient was assessed for positional comfort and pressure points before starting the procedure. Time-out: I initiated and conducted the "Time-out" before starting the procedure, as per protocol. The patient was asked to participate by confirming the accuracy of the "Time Out"  information. Verification of the correct person, site, and procedure were performed and confirmed by me, the nursing staff, and the patient. "Time-out" conducted as per Joint Commission's Universal Protocol (UP.01.01.01). Time: 1200  Description of Procedure:       Position: Sitting Target Area: Knee Joint Approach: Just above the Medial tibial plateau, lateral to the infrapatellar tendon. Area Prepped: Entire knee area, from the mid-thigh to the mid-shin. Prepping solution: ChloraPrep (2% chlorhexidine gluconate and 70% isopropyl alcohol) Safety Precautions: Aspiration looking for blood return was conducted prior to all injections. At no point did we inject any substances, as a needle was being advanced. No attempts were made at seeking any paresthesias. Safe injection practices and needle disposal techniques used. Medications properly checked for expiration dates. SDV (single dose vial) medications used. Description of the Procedure: Protocol guidelines were followed. The patient was placed in position over the fluoroscopy table. The target area was identified and the area prepped in the usual manner. Skin desensitized using vapocoolant spray. Skin & deeper tissues infiltrated with local anesthetic. Appropriate amount of time allowed to pass for local anesthetics to take effect. The procedure needles were then advanced to the target area. Proper needle placement secured. Negative aspiration confirmed. Solution injected in intermittent fashion, asking for systemic symptoms every 0.5cc of injectate. The needles were then removed and the area cleansed, making sure to leave some of the prepping solution back to take advantage of its long term bactericidal properties. Vitals:   06/11/17 1107 06/11/17 1200  BP: 115/85 (!) 114/49  Pulse: 99 96  Resp: 18 18  Temp: 98.3 F (36.8 C) 98.2 F (36.8 C)  TempSrc: Oral Oral  SpO2: 96% 97%  Weight: (!) 368 lb (166.9 kg)   Height:  (1.549 m)     Start  Time: 1200 hrs. End Time: 1209 hrs. Materials:  Needle(s) Type: Regular needle Gauge: 25G Length: 3.5-in Medication(s): Please see orders for medications and dosing details. 2 cc of Hyalgan injected in each knee. Imaging Guidance:  Type of Imaging Technique: None used Indication(s):  N/A Exposure Time: No patient exposure Contrast: None used. Fluoroscopic Guidance: N/A Ultrasound Guidance: N/A Interpretation: N/A  Antibiotic Prophylaxis:   Anti-infectives (From admission, onward)   None     Indication(s): None identified  Post-operative Assessment:  Post-procedure Vital Signs:  Pulse/HCG Rate: 96  Temp: 98.2 F (36.8 C) Resp: 18 BP: (!) 114/49 SpO2: 97 %  EBL: None  Complications: No immediate post-treatment complications observed by team, or reported by patient.  Note: The patient tolerated the entire procedure well. A repeat set of vitals were taken after the procedure and the patient was kept under observation following institutional policy, for this type of procedure. Post-procedural neurological assessment was performed, showing return to baseline, prior to discharge. The patient was provided with post-procedure discharge instructions, including a section on how to identify potential problems. Should any problems arise concerning this procedure, the patient was given instructions to immediately contact us, at any time, without hesitation. In any case, we plan to contact the patient by telephone for a follow-up status report regarding this interventional procedure.  Comments:  No additional relevant information.  Plan of Care  Patient to return in 2 weeks for repeat Hyalgan injection. Imaging Orders  No imaging studies ordered today    Procedure Orders     KNEE INJECTION  Medications ordered for procedure: Meds ordered this encounter  Medications  . lidocaine (PF) (XYLOCAINE) 1 % injection 5 mL  . Sodium Hyaluronate SOSY 2 mL  . Sodium Hyaluronate SOSY 2 mL    Medications administered: We administered lidocaine (PF), Sodium Hyaluronate, and Sodium Hyaluronate.  See the medical record for exact dosing, route, and time of administration.  New Prescriptions   No medications on file   Disposition: Discharge home  Discharge Date & Time: 06/11/2017; 1220 hrs.   Physician-requested Follow-up: Return in about 2 weeks (around 06/25/2017).  Future Appointments  Date Time Provider Department Center  06/25/2017  8:15 AM [facility] LAB [facility] None  06/25/2017  8:30 AM [facility] INJECTION [facility] None  06/25/2017 11:30 AM Edward Jolly, MD [facility] None  07/22/2017 11:00 AM Delma Freeze, FNP [facility] None  07/23/2017  8:30 AM [facility] INJECTION [facility] None  08/19/2017  8:30 AM [facility] LAB [facility] None  08/20/2017  8:30 AM [facility] INJECTION [facility] None  08/20/2017  8:45 AM Rosey Bath, MD [facility] None  09/02/2017  8:45 AM Barbette Merino, NP [facility] None  09/17/2017  8:30 AM [facility] INJECTION [facility] None  10/15/2017  8:30 AM [facility] INJECTION [facility] None  11/12/2017  8:30 AM [facility] INJECTION [facility] None   Primary Care Physician: Lazarus Gowda, MD Location: [facility] Note by: Edward Jolly, MD Date: 06/11/2017; Time: 12:24 PM  Disclaimer:  Medicine is not an exact science. The only guarantee in medicine is that nothing is guaranteed. It is important to note that the decision to proceed with this intervention was based on the information collected from the patient. The Data and conclusions were drawn from the patient's questionnaire, the interview, and the physical examination. Because the information was provided in large part by the patient, it cannot be guaranteed that it has not been purposely or unconsciously manipulated. Every effort has been made to obtain as much relevant data as possible for this evaluation. It is important to note that the conclusions that lead to this procedure are derived  in large part from the available data. Always take into account that the treatment will also be dependent on availability of resources and  existing treatment guidelines, considered by other Pain Management Practitioners as being common knowledge and practice, at the time of the intervention. For Medico-Legal purposes, it is also important to point out that variation in procedural techniques and pharmacological choices are the acceptable norm. The indications, contraindications, technique, and results of the above procedure should only be interpreted and judged by a Board-Certified Interventional Pain Specialist with extensive familiarity and expertise in the same exact procedure and technique.

## 2017-06-12 ENCOUNTER — Telehealth: Payer: Self-pay

## 2017-06-12 ENCOUNTER — Ambulatory Visit: Payer: Self-pay | Admitting: Family

## 2017-06-12 NOTE — Telephone Encounter (Signed)
Post procedure phone call.  Left message.  

## 2017-06-25 ENCOUNTER — Inpatient Hospital Stay: Payer: Medicaid Other

## 2017-06-25 ENCOUNTER — Other Ambulatory Visit: Payer: Self-pay

## 2017-06-25 ENCOUNTER — Inpatient Hospital Stay: Payer: Medicaid Other | Attending: Hematology and Oncology

## 2017-06-25 ENCOUNTER — Encounter: Payer: Self-pay | Admitting: Student in an Organized Health Care Education/Training Program

## 2017-06-25 ENCOUNTER — Ambulatory Visit
Payer: Medicaid Other | Attending: Student in an Organized Health Care Education/Training Program | Admitting: Student in an Organized Health Care Education/Training Program

## 2017-06-25 VITALS — BP 136/91 | HR 85 | Temp 98.2°F | Resp 16 | Ht 61.0 in | Wt 367.0 lb

## 2017-06-25 VITALS — BP 133/85 | HR 93 | Temp 100.0°F | Resp 21

## 2017-06-25 DIAGNOSIS — G894 Chronic pain syndrome: Secondary | ICD-10-CM

## 2017-06-25 DIAGNOSIS — D509 Iron deficiency anemia, unspecified: Secondary | ICD-10-CM

## 2017-06-25 DIAGNOSIS — M25561 Pain in right knee: Secondary | ICD-10-CM

## 2017-06-25 DIAGNOSIS — M5441 Lumbago with sciatica, right side: Secondary | ICD-10-CM

## 2017-06-25 DIAGNOSIS — M25562 Pain in left knee: Secondary | ICD-10-CM | POA: Insufficient documentation

## 2017-06-25 DIAGNOSIS — M545 Low back pain: Secondary | ICD-10-CM | POA: Diagnosis not present

## 2017-06-25 DIAGNOSIS — D500 Iron deficiency anemia secondary to blood loss (chronic): Secondary | ICD-10-CM

## 2017-06-25 DIAGNOSIS — E538 Deficiency of other specified B group vitamins: Secondary | ICD-10-CM | POA: Insufficient documentation

## 2017-06-25 DIAGNOSIS — G8929 Other chronic pain: Secondary | ICD-10-CM | POA: Diagnosis not present

## 2017-06-25 DIAGNOSIS — M79605 Pain in left leg: Secondary | ICD-10-CM

## 2017-06-25 DIAGNOSIS — M5442 Lumbago with sciatica, left side: Secondary | ICD-10-CM

## 2017-06-25 DIAGNOSIS — M79604 Pain in right leg: Secondary | ICD-10-CM

## 2017-06-25 LAB — FOLATE: Folate: 8.1 ng/mL (ref >5.9)

## 2017-06-25 LAB — FERRITIN: Ferritin: 14 ng/mL (ref 11–307)

## 2017-06-25 LAB — SEDIMENTATION RATE: Sed Rate: 30 mm/hr — ABNORMAL HIGH (ref 0–20)

## 2017-06-25 MED ORDER — LIDOCAINE HCL (PF) 1 % IJ SOLN
5.0000 mL | Freq: Once | INTRAMUSCULAR | Status: DC
  Filled 2017-06-25: qty 5

## 2017-06-25 MED ORDER — SODIUM HYALURONATE (VISCOSUP) 20 MG/2ML IX SOSY: 2.0000 mL | PREFILLED_SYRINGE | Freq: Once | INTRA_ARTICULAR | Status: DC

## 2017-06-25 MED ORDER — CYANOCOBALAMIN 1000 MCG/ML IJ SOLN
1000.0000 ug | Freq: Once | INTRAMUSCULAR | Status: AC
  Administered 2017-06-25: 1000 ug via INTRAMUSCULAR

## 2017-06-25 NOTE — Progress Notes (Signed)
Patient's Name: Tracey White  MRN: [id number]  Referring Provider: Lazarus Gowda, MD  DOB: 29-Dec-1980  PCP: Lazarus Gowda, MD  DOS: 06/25/2017  Note by: Edward Jolly, MD  Service setting: Ambulatory outpatient  Specialty: Interventional Pain Management  Patient type: Established  Location: [facility] (AMB) Pain Management Facility  Visit type: Interventional Procedure   Primary Reason for Visit: Interventional Pain Management Treatment. CC: Knee Injury (both)  Procedure:  Anesthesia, Analgesia, Anxiolysis:  Type: Diagnostic Intra-Articular Hyalgan Knee Injection #2  Region: Medial infrapatellar Knee Region Level: Knee Joint Laterality: Bilateral  Type: Local Anesthesia Indication(s): Analgesia         Local Anesthetic: Lidocaine 1-2% Route: Infiltration (/IM) IV Access: Declined Sedation: Declined    Indications: 1. Chronic knee pain (Secondary Area of Pain) (Bilateral) (R>L)   2. Chronic lower extremity pain (Tertiary Area of Pain) (Bilateral) (R>L)   3. Chronic low back pain (Primary Area of Pain) (Bilateral) (L>R)   4. Chronic pain syndrome    Pain Score: Pre-procedure: 5 /10 Post-procedure: 6 /10  Pre-op Assessment:  Tracey White is a 37 y.o. (year old), female patient, seen today for interventional treatment. She  has a past surgical history that includes Tubal ligation; Hernia repair; and scoliosis repair. Tracey White has a current medication list which includes the following prescription(s): albuterol, aripiprazole, budesonide-formoterol, bumetanide, bupropion, diclofenac sodium, diltiazem, escitalopram, ferrous sulfate, lidocaine, losartan, meloxicam, metoprolol succinate, montelukast, norgestimate-ethinyl estradiol, potassium chloride sa, tramadol, and vitamin d (ergocalciferol), and the following Facility-Administered Medications: lidocaine (pf), sodium hyaluronate, sodium hyaluronate, and technetium tetrofosmin. Her primarily concern today is the Knee Injury (both)  Initial  Vital Signs:  Pulse/HCG Rate: 92  Temp: 98.2 F (36.8 C) Resp: 16 BP: (!) 122/96 SpO2: 97 %  BMI: Estimated body mass index is 69.34 kg/m as calculated from the following:   Height as of this encounter:  (1.549 m).   Weight as of this encounter: 367 lb (166.5 kg).  Risk Assessment: Allergies: Reviewed. She has No Known Allergies.  Allergy Precautions: None required Coagulopathies: Reviewed. None identified.  Blood-thinner therapy: None at this time Active Infection(s): Reviewed. None identified. Tracey White is afebrile  Site Confirmation: Tracey White was asked to confirm the procedure and laterality before marking the site Procedure checklist: Completed Consent: Before the procedure and under the influence of no sedative(s), amnesic(s), or anxiolytics, the patient was informed of the treatment options, risks and possible complications. To fulfill our ethical and legal obligations, as recommended by the American Medical Association's Code of Ethics, I have informed the patient of my clinical impression; the nature and purpose of the treatment or procedure; the risks, benefits, and possible complications of the intervention; the alternatives, including doing nothing; the risk(s) and benefit(s) of the alternative treatment(s) or procedure(s); and the risk(s) and benefit(s) of doing nothing. The patient was provided information about the general risks and possible complications associated with the procedure. These may include, but are not limited to: failure to achieve desired goals, infection, bleeding, organ or nerve damage, allergic reactions, paralysis, and death. In addition, the patient was informed of those risks and complications associated to the procedure, such as failure to decrease pain; infection; bleeding; organ or nerve damage with subsequent damage to sensory, motor, and/or autonomic systems, resulting in permanent pain, numbness, and/or weakness of one or several areas of the  body; allergic reactions; (i.e.: anaphylactic reaction); and/or death. Furthermore, the patient was informed of those risks and complications associated with the medications. These include,  but are not limited to: allergic reactions (i.e.: anaphylactic or anaphylactoid reaction(s)); adrenal axis suppression; blood sugar elevation that in diabetics may result in ketoacidosis or comma; water retention that in patients with history of congestive heart failure may result in shortness of breath, pulmonary edema, and decompensation with resultant heart failure; weight gain; swelling or edema; medication-induced neural toxicity; particulate matter embolism and blood vessel occlusion with resultant organ, and/or nervous system infarction; and/or aseptic necrosis of one or more joints. Finally, the patient was informed that Medicine is not an exact science; therefore, there is also the possibility of unforeseen or unpredictable risks and/or possible complications that may result in a catastrophic outcome. The patient indicated having understood very clearly. We have given the patient no guarantees and we have made no promises. Enough time was given to the patient to ask questions, all of which were answered to the patient's satisfaction. Tracey White has indicated that she wanted to continue with the procedure. Attestation: I, the ordering provider, attest that I have discussed with the patient the benefits, risks, side-effects, alternatives, likelihood of achieving goals, and potential problems during recovery for the procedure that I have provided informed consent. Date  Time: 06/25/2017 10:42 AM  Pre-Procedure Preparation:  Monitoring: As per clinic protocol. Respiration, ETCO2, SpO2, BP, heart rate and rhythm monitor placed and checked for adequate function Safety Precautions: Patient was assessed for positional comfort and pressure points before starting the procedure. Time-out: I initiated and conducted the  "Time-out" before starting the procedure, as per protocol. The patient was asked to participate by confirming the accuracy of the "Time Out" information. Verification of the correct person, site, and procedure were performed and confirmed by me, the nursing staff, and the patient. "Time-out" conducted as per Joint Commission's Universal Protocol (UP.01.01.01). Time: 1145  Description of Procedure:       Position: Sitting Target Area: Knee Joint Approach: Just above the Medial tibial plateau, lateral to the infrapatellar tendon. Area Prepped: Entire knee area, from the mid-thigh to the mid-shin. Prepping solution: ChloraPrep (2% chlorhexidine gluconate and 70% isopropyl alcohol) Safety Precautions: Aspiration looking for blood return was conducted prior to all injections. At no point did we inject any substances, as a needle was being advanced. No attempts were made at seeking any paresthesias. Safe injection practices and needle disposal techniques used. Medications properly checked for expiration dates. SDV (single dose vial) medications used. Description of the Procedure: Protocol guidelines were followed. The patient was placed in position over the fluoroscopy table. The target area was identified and the area prepped in the usual manner. Skin desensitized using vapocoolant spray. Skin & deeper tissues infiltrated with local anesthetic. Appropriate amount of time allowed to pass for local anesthetics to take effect. The procedure needles were then advanced to the target area. Proper needle placement secured. Negative aspiration confirmed. Solution injected in intermittent fashion, asking for systemic symptoms every 0.5cc of injectate. The needles were then removed and the area cleansed, making sure to leave some of the prepping solution back to take advantage of its long term bactericidal properties. Vitals:   06/25/17 1053 06/25/17 1157  BP: (!) 122/96 (!) 136/91  Pulse: 92 85  Resp:  16  Temp:  98.2 F (36.8 C)   SpO2: 97% 100%  Weight: (!) 367 lb (166.5 kg)   Height:  (1.549 m)     Start Time: 1146 hrs. End Time: 1153 hrs. Materials:  Needle(s) Type: Regular needle Gauge: 25G Length: 3.5-in Medication(s): Please see orders  for medications and dosing details. 2 cc of Hyalgan injected in each knee. Imaging Guidance:  Type of Imaging Technique: None used Indication(s): N/A Exposure Time: No patient exposure Contrast: None used. Fluoroscopic Guidance: N/A Ultrasound Guidance: N/A Interpretation: N/A  Antibiotic Prophylaxis:   Anti-infectives (From admission, onward)   None     Indication(s): None identified  Post-operative Assessment:  Post-procedure Vital Signs:  Pulse/HCG Rate: 85  Temp: 98.2 F (36.8 C) Resp: 16 BP: (!) 136/91 SpO2: 100 %  EBL: None  Complications: No immediate post-treatment complications observed by team, or reported by patient.  Note: The patient tolerated the entire procedure well. A repeat set of vitals were taken after the procedure and the patient was kept under observation following institutional policy, for this type of procedure. Post-procedural neurological assessment was performed, showing return to baseline, prior to discharge. The patient was provided with post-procedure discharge instructions, including a section on how to identify potential problems. Should any problems arise concerning this procedure, the patient was given instructions to immediately contact us, at any time, without hesitation. In any case, we plan to contact the patient by telephone for a follow-up status report regarding this interventional procedure.  Comments:  No additional relevant information.  Plan of Care  Patient to return in 2 weeks for repeat Hyalgan injection #3. Imaging Orders  No imaging studies ordered today    Procedure Orders     KNEE INJECTION  Medications ordered for procedure: Meds ordered this encounter  Medications  .  lidocaine (PF) (XYLOCAINE) 1 % injection 5 mL  . Sodium Hyaluronate SOSY 2 mL  . Sodium Hyaluronate SOSY 2 mL   Medications administered: Mieshia A. Majchrzak had no medications administered during this visit.  See the medical record for exact dosing, route, and time of administration.  New Prescriptions   No medications on file   Disposition: Discharge home  Discharge Date & Time: 06/25/2017; 1200 hrs.   Physician-requested Follow-up: Return in about 2 weeks (around 07/09/2017) for Procedure.  Future Appointments  Date Time Provider Department Center  07/09/2017  9:45 AM Edward Jolly, MD [facility] None  07/22/2017 11:00 AM Delma Freeze, FNP [facility] None  07/23/2017  8:30 AM [facility] INJECTION [facility] None  08/19/2017  8:30 AM [facility] LAB [facility] None  08/20/2017  8:30 AM [facility] INJECTION [facility] None  08/20/2017  8:45 AM Rosey Bath, MD [facility] None  09/02/2017  8:45 AM Barbette Merino, NP [facility] None  09/17/2017  8:30 AM [facility] INJECTION [facility] None  10/15/2017  8:30 AM [facility] INJECTION [facility] None  11/12/2017  8:30 AM [facility] INJECTION [facility] None   Primary Care Physician: Lazarus Gowda, MD Location: [facility] Note by: Edward Jolly, MD Date: 06/25/2017; Time: 2:48 PM  Disclaimer:  Medicine is not an exact science. The only guarantee in medicine is that nothing is guaranteed. It is important to note that the decision to proceed with this intervention was based on the information collected from the patient. The Data and conclusions were drawn from the patient's questionnaire, the interview, and the physical examination. Because the information was provided in large part by the patient, it cannot be guaranteed that it has not been purposely or unconsciously manipulated. Every effort has been made to obtain as much relevant data as possible for this evaluation. It is important to note that the conclusions that lead to this procedure  are derived in large part from the available data. Always take into account that the  treatment will also be dependent on availability of resources and existing treatment guidelines, considered by other Pain Management Practitioners as being common knowledge and practice, at the time of the intervention. For Medico-Legal purposes, it is also important to point out that variation in procedural techniques and pharmacological choices are the acceptable norm. The indications, contraindications, technique, and results of the above procedure should only be interpreted and judged by a Board-Certified Interventional Pain Specialist with extensive familiarity and expertise in the same exact procedure and technique.

## 2017-06-25 NOTE — Patient Instructions (Signed)

## 2017-06-26 ENCOUNTER — Telehealth: Payer: Self-pay | Admitting: *Deleted

## 2017-06-26 NOTE — Telephone Encounter (Signed)
No problems post procedure. 

## 2017-07-02 ENCOUNTER — Inpatient Hospital Stay: Payer: Medicaid Other | Attending: Hematology and Oncology

## 2017-07-02 VITALS — BP 104/68 | HR 83 | Temp 97.9°F | Resp 16

## 2017-07-02 DIAGNOSIS — D509 Iron deficiency anemia, unspecified: Secondary | ICD-10-CM | POA: Diagnosis present

## 2017-07-02 DIAGNOSIS — N920 Excessive and frequent menstruation with regular cycle: Secondary | ICD-10-CM | POA: Insufficient documentation

## 2017-07-02 DIAGNOSIS — E538 Deficiency of other specified B group vitamins: Secondary | ICD-10-CM | POA: Insufficient documentation

## 2017-07-02 MED ORDER — IRON SUCROSE 20 MG/ML IV SOLN
INTRAVENOUS | Status: AC
  Filled 2017-07-02: qty 10

## 2017-07-02 MED ORDER — SODIUM CHLORIDE 0.9 % IV SOLN
Freq: Once | INTRAVENOUS | Status: AC
  Administered 2017-07-02: 14:00:00 via INTRAVENOUS
  Filled 2017-07-02: qty 1000

## 2017-07-02 MED ORDER — IRON SUCROSE 20 MG/ML IV SOLN
200.0000 mg | Freq: Once | INTRAVENOUS | Status: AC
  Administered 2017-07-02: 200 mg via INTRAVENOUS
  Filled 2017-07-02: qty 10

## 2017-07-02 NOTE — Patient Instructions (Signed)
Iron Sucrose injection What is this medicine? IRON SUCROSE (AHY ern SOO krohs) is an iron complex. Iron is used to make healthy red blood cells, which carry oxygen and nutrients throughout the body. This medicine is used to treat iron deficiency anemia in people with chronic kidney disease. This medicine may be used for other purposes; ask your health care provider or pharmacist if you have questions. COMMON BRAND NAME(S): Venofer What should I tell my health care provider before I take this medicine? They need to know if you have any of these conditions: -anemia not caused by low iron levels -heart disease -high levels of iron in the blood -kidney disease -liver disease -an unusual or allergic reaction to iron, other medicines, foods, dyes, or preservatives -pregnant or trying to get pregnant -breast-feeding How should I use this medicine? This medicine is for infusion into a vein. It is given by a health care professional in a hospital or clinic setting. Talk to your pediatrician regarding the use of this medicine in children. While this drug may be prescribed for children as young as 2 years for selected conditions, precautions do apply. Overdosage: If you think you have taken too much of this medicine contact a poison control center or emergency room at once. NOTE: This medicine is only for you. Do not share this medicine with others. What if I miss a dose? It is important not to miss your dose. Call your doctor or health care professional if you are unable to keep an appointment. What may interact with this medicine? Do not take this medicine with any of the following medications: -deferoxamine -dimercaprol -other iron products This medicine may also interact with the following medications: -chloramphenicol -deferasirox This list may not describe all possible interactions. Give your health care provider a list of all the medicines, herbs, non-prescription drugs, or dietary  supplements you use. Also tell them if you smoke, drink alcohol, or use illegal drugs. Some items may interact with your medicine. What should I watch for while using this medicine? Visit your doctor or healthcare professional regularly. Tell your doctor or healthcare professional if your symptoms do not start to get better or if they get worse. You may need blood work done while you are taking this medicine. You may need to follow a special diet. Talk to your doctor. Foods that contain iron include: whole grains/cereals, dried fruits, beans, or peas, leafy green vegetables, and organ meats (liver, kidney). What side effects may I notice from receiving this medicine? Side effects that you should report to your doctor or health care professional as soon as possible: -allergic reactions like skin rash, itching or hives, swelling of the face, lips, or tongue -breathing problems -changes in blood pressure -cough -fast, irregular heartbeat -feeling faint or lightheaded, falls -fever or chills -flushing, sweating, or hot feelings -joint or muscle aches/pains -seizures -swelling of the ankles or feet -unusually weak or tired Side effects that usually do not require medical attention (report to your doctor or health care professional if they continue or are bothersome): -diarrhea -feeling achy -headache -irritation at site where injected -nausea, vomiting -stomach upset -tiredness This list may not describe all possible side effects. Call your doctor for medical advice about side effects. You may report side effects to FDA at 1-800-FDA-1088. Where should I keep my medicine? This drug is given in a hospital or clinic and will not be stored at home. NOTE: This sheet is a summary. It may not cover all possible information. If   you have questions about this medicine, talk to your doctor, pharmacist, or health care provider.  2018 Elsevier/Gold Standard (2010-10-25 17:14:35)  

## 2017-07-08 ENCOUNTER — Inpatient Hospital Stay: Payer: Medicaid Other

## 2017-07-08 ENCOUNTER — Ambulatory Visit: Payer: Self-pay

## 2017-07-09 ENCOUNTER — Encounter: Payer: Self-pay | Admitting: Student in an Organized Health Care Education/Training Program

## 2017-07-09 ENCOUNTER — Inpatient Hospital Stay: Payer: Medicaid Other

## 2017-07-09 ENCOUNTER — Ambulatory Visit: Payer: Self-pay

## 2017-07-09 ENCOUNTER — Ambulatory Visit
Payer: Medicaid Other | Attending: Student in an Organized Health Care Education/Training Program | Admitting: Student in an Organized Health Care Education/Training Program

## 2017-07-09 ENCOUNTER — Other Ambulatory Visit: Payer: Self-pay

## 2017-07-09 VITALS — BP 117/83 | HR 76 | Temp 98.6°F | Resp 18 | Ht 61.0 in | Wt 361.0 lb

## 2017-07-09 DIAGNOSIS — G8929 Other chronic pain: Secondary | ICD-10-CM | POA: Diagnosis not present

## 2017-07-09 DIAGNOSIS — M25562 Pain in left knee: Secondary | ICD-10-CM | POA: Insufficient documentation

## 2017-07-09 DIAGNOSIS — M25561 Pain in right knee: Secondary | ICD-10-CM | POA: Insufficient documentation

## 2017-07-09 MED ORDER — LIDOCAINE HCL (PF) 1 % IJ SOLN
INTRAMUSCULAR | Status: AC
  Filled 2017-07-09: qty 5

## 2017-07-09 MED ORDER — SODIUM HYALURONATE (VISCOSUP) 20 MG/2ML IX SOSY
2.0000 mL | PREFILLED_SYRINGE | Freq: Once | INTRA_ARTICULAR | Status: AC
  Administered 2017-07-09: 2 mL via INTRA_ARTICULAR

## 2017-07-09 MED ORDER — LIDOCAINE HCL (PF) 1 % IJ SOLN
5.0000 mL | Freq: Once | INTRAMUSCULAR | Status: AC
  Administered 2017-07-09: 5 mL

## 2017-07-09 NOTE — Progress Notes (Signed)
Safety precautions to be maintained throughout the outpatient stay will include: orient to surroundings, keep bed in low position, maintain call bell within reach at all times, provide assistance with transfer out of bed and ambulation.  

## 2017-07-09 NOTE — Progress Notes (Signed)
Patient's Name: Tracey White  MRN: [id number]  Referring Provider: Lazarus Gowda, MD  DOB: 09/20/1980  PCP: Lazarus Gowda, MD  DOS: 07/09/2017  Note by: Edward Jolly, MD  Service setting: Ambulatory outpatient  Specialty: Interventional Pain Management  Patient type: Established  Location: [facility] (AMB) Pain Management Facility  Visit type: Interventional White   Primary Reason for Visit: Interventional Pain Management Treatment. CC: Knee Pain (bilateral)  White:  Anesthesia, Analgesia, Anxiolysis:  Type: Diagnostic Intra-Articular Hyalgan Knee Injection #3  Region: Medial infrapatellar Knee Region Level: Knee Joint Laterality: Bilateral  Type: Local Anesthesia Indication(s): Analgesia         Local Anesthetic: Lidocaine 1-2% Route: Infiltration ([place]/IM) IV Access: Declined Sedation: Declined    Indications: 1. Chronic knee pain (Secondary Area of Pain) (Bilateral) (R>L)    Pain Score: Pre-White: 7 /10 Post-White: 7 /10  Pre-op Assessment:  Tracey White is a 37 y.o. (year old), female patient, seen today for interventional treatment. She  has a past surgical history that includes Tubal ligation; Hernia repair; and scoliosis repair. Tracey White has a current medication list which includes the following prescription(s): albuterol, aripiprazole, budesonide-formoterol, bumetanide, bupropion, diclofenac sodium, diltiazem, escitalopram, ferrous sulfate, lidocaine, losartan, meloxicam, metoprolol succinate, montelukast, norgestimate-ethinyl estradiol, potassium chloride sa, tramadol, and vitamin d (ergocalciferol), and the following Facility-Administered Medications: technetium tetrofosmin. Her primarily concern today is the Knee Pain (bilateral)  Initial Vital Signs:  Pulse/HCG Rate: 83  Temp: 98.6 F (37 C) Resp: 18 BP: 107/76 SpO2: 98 %(2L O2 via [place])  BMI: Estimated body mass index is 68.21 kg/m as calculated from the following:   Height as of this encounter: 5\' 1"   (1.549 m).   Weight as of this encounter: 361 lb (163.7 kg).  Risk Assessment: Allergies: Reviewed. She has No Known Allergies.  Allergy Precautions: None required Coagulopathies: Reviewed. None identified.  Blood-thinner therapy: None at this time Active Infection(s): Reviewed. None identified. Tracey White is afebrile  White Confirmation: Tracey White White checklist: Completed Consent: Before the White and under the influence of no sedative(s), amnesic(s), or anxiolytics, the patient was informed of the treatment options, risks and possible complications. To fulfill our ethical and legal obligations, as recommended by the American Medical Association's Code of Ethics, I have informed the patient of my clinical impression; the nature and purpose of the treatment or White; the risks, benefits, and possible complications of the intervention; the alternatives, including doing nothing; the risk(s) and benefit(s) of the alternative treatment(s) or White(s); and the risk(s) and benefit(s) of doing nothing. The patient was provided information about the general risks and possible complications associated with the White. These may include, but are not limited to: failure to achieve desired goals, infection, bleeding, organ or nerve damage, allergic reactions, paralysis, and death. In addition, the patient was informed of those risks and complications associated to the White, such as failure to decrease pain; infection; bleeding; organ or nerve damage with subsequent damage to sensory, motor, and/or autonomic systems, resulting in permanent pain, numbness, and/or weakness of one or several areas of the body; allergic reactions; (i.e.: anaphylactic reaction); and/or death. Furthermore, the patient was informed of those risks and complications associated with the medications. These include, but are not limited to:  allergic reactions (i.e.: anaphylactic or anaphylactoid reaction(s)); adrenal axis suppression; blood sugar elevation that in diabetics may result in ketoacidosis or comma; water retention that in patients with history of congestive heart  failure may result in shortness of breath, pulmonary edema, and decompensation with resultant heart failure; weight gain; swelling or edema; medication-induced neural toxicity; particulate matter embolism and blood vessel occlusion with resultant organ, and/or nervous system infarction; and/or aseptic necrosis of one or more joints. Finally, the patient was informed that Medicine is not an exact science; therefore, there is also the possibility of unforeseen or unpredictable risks and/or possible complications that may result in a catastrophic outcome. The patient indicated having understood very clearly. We have given the patient no guarantees and we have made no promises. Enough time was given to the patient to ask questions, all of which were answered to the patient's satisfaction. Tracey White. Attestation: I, the ordering provider, attest that I have discussed with the patient the benefits, risks, side-effects, alternatives, likelihood of achieving goals, and potential problems during recovery for the White that I have provided informed consent. Date  Time: 07/09/2017  9:34 AM  Pre-White Preparation:  Monitoring: As per clinic protocol. Respiration, ETCO2, SpO2, BP, heart rate and rhythm monitor placed and checked for adequate function Safety Precautions: Patient was assessed for positional comfort and pressure points before starting the White. Time-out: I initiated and conducted the "Time-out" before starting the White, as per protocol. The patient was asked to participate by confirming the accuracy of the "Time Out" information. Verification of the correct person, White, and White were  performed and confirmed by me, the nursing staff, and the patient. "Time-out" conducted as per Joint Commission's Universal Protocol (UP.01.01.01). Time: 1039  Description of White:       Position: Sitting Target Area: Knee Joint Approach: Just above the Medial tibial plateau, lateral to the infrapatellar tendon. Area Prepped: Entire knee area, from the mid-thigh to the mid-shin. Prepping solution: ChloraPrep (2% chlorhexidine gluconate and 70% isopropyl alcohol) Safety Precautions: Aspiration looking for blood return was conducted prior to all injections. At no point did we inject any substances, as a needle was being advanced. No attempts were made at seeking any paresthesias. Safe injection practices and needle disposal techniques used. Medications properly checked for expiration dates. SDV (single dose vial) medications used. Description of the White: Protocol guidelines were followed. The patient was placed in position over the fluoroscopy table. The target area was identified and the area prepped in the usual manner. Skin desensitized using vapocoolant spray. Skin & deeper tissues infiltrated with local anesthetic. Appropriate amount of time allowed to pass for local anesthetics to take effect. The White needles were then advanced to the target area. Proper needle placement secured. Negative aspiration confirmed. Solution injected in intermittent fashion, asking for systemic symptoms every 0.5cc of injectate. The needles were then removed and the area cleansed, making sure to leave some of the prepping solution back to take advantage of its long term bactericidal properties. Vitals:   07/09/17 0936 07/09/17 1047  BP: 107/76 117/83  Pulse: 83 76  Resp: 18 18  Temp: 98.6 F (37 C)   TempSrc: Oral   SpO2: 98% 100%  Weight: (!) 361 lb (163.7 kg)   Height: 5\' 1"  (1.549 m)     Start Time: 1039 hrs. End Time: 1043 hrs. Materials:  Needle(s) Type: Regular needle Gauge:  25G Length: 3.5-in Medication(s): Please see orders for medications and dosing details. 2 cc of Hyalgan injected in each knee. Imaging Guidance:  Type of Imaging Technique: None used Indication(s): N/A Exposure Time: No patient exposure Contrast: None used. Fluoroscopic Guidance:  N/A Ultrasound Guidance: N/A Interpretation: N/A  Antibiotic Prophylaxis:   Anti-infectives (From admission, onward)   None     Indication(s): None identified  Post-operative Assessment:  Post-White Vital Signs:  Pulse/HCG Rate: 76  Temp: 98.6 F (37 C) Resp: 18 BP: 117/83 SpO2: 100 %  EBL: None  Complications: No immediate post-treatment complications observed by team, or reported by patient.  Note: The patient tolerated the entire White well. A repeat set of vitals were taken after the White and the patient was kept under observation following institutional policy, for this type of White. Post-procedural neurological assessment was performed, showing return to baseline, prior to discharge. The patient was provided with post-White discharge instructions, including a section on how to identify potential problems. Should any problems arise concerning this White, the patient was given instructions to immediately contact us, at any time, without hesitation. In any case, we plan to contact the patient by telephone for a follow-up status report regarding this interventional White.  Comments:  No additional relevant information.  Plan of Care   Imaging Orders  No imaging studies ordered today   White Orders    No White(s) ordered today    Medications ordered for White: Meds ordered this encounter  Medications  . Sodium Hyaluronate SOSY 2 mL  . Sodium Hyaluronate SOSY 2 mL  . lidocaine (PF) (XYLOCAINE) 1 % injection 5 mL   Medications administered: We administered Sodium Hyaluronate, Sodium Hyaluronate, and lidocaine (PF).  See the medical record for  exact dosing, route, and time of administration.  New Prescriptions   No medications on file   Disposition: Discharge home  Discharge Date & Time: 07/09/2017; 1055 hrs.   Physician-requested Follow-up: Return in 1 month (on 08/06/2017) for Post White Evaluation.  Future Appointments  Date Time Provider Department Center  07/10/2017  9:00 AM [facility] INFUSION CHAIR 4 [facility] None  07/15/2017  9:00 AM [facility] INFUSION CHAIR 5 [facility] None  07/22/2017 11:00 AM Delma Freeze, FNP [facility] None  07/23/2017  8:30 AM [facility] INJECTION [facility] None  08/07/2017  9:45 AM Edward Jolly, MD [facility] None  08/19/2017  8:30 AM [facility] LAB [facility] None  08/20/2017  8:30 AM [facility] INJECTION [facility] None  08/20/2017  8:45 AM Rosey Bath, MD [facility] None  09/02/2017  8:45 AM Barbette Merino, NP [facility] None  09/17/2017  8:30 AM [facility] INJECTION [facility] None  10/15/2017  8:30 AM [facility] INJECTION [facility] None  11/12/2017  8:30 AM [facility] INJECTION [facility] None   Primary Care Physician: Lazarus Gowda, MD Location: [facility] Note by: Edward Jolly, MD Date: 07/09/2017; Time: 2:59 PM  Disclaimer:  Medicine is not an exact science. The only guarantee in medicine is that nothing is guaranteed. It is important to note that the decision to proceed with this intervention was based on the information collected from the patient. The Data and conclusions were drawn from the patient's questionnaire, the interview, and the physical examination. Because the information was provided in large part by the patient, it cannot be guaranteed that it has not been purposely or unconsciously manipulated. Every effort has been made to obtain as much relevant data as possible for this evaluation. It is important to note that the conclusions that lead to this White are derived in large part from the available data. Always take into account that the treatment will also be  dependent on availability of resources and existing treatment guidelines, considered by other Pain Management Practitioners as being  common knowledge and practice, at the time of the intervention. For Medico-Legal purposes, it is also important to point out that variation in procedural techniques and pharmacological choices are the acceptable norm. The indications, contraindications, technique, and results of the above White should only be interpreted and judged by a Board-Certified Interventional Pain Specialist with extensive familiarity and expertise in the same exact White and technique.

## 2017-07-09 NOTE — Patient Instructions (Signed)

## 2017-07-10 ENCOUNTER — Telehealth: Payer: Self-pay | Admitting: *Deleted

## 2017-07-10 ENCOUNTER — Inpatient Hospital Stay: Payer: Medicaid Other

## 2017-07-10 VITALS — BP 127/68 | HR 79 | Temp 98.7°F | Resp 20

## 2017-07-10 DIAGNOSIS — D509 Iron deficiency anemia, unspecified: Secondary | ICD-10-CM

## 2017-07-10 MED ORDER — IRON SUCROSE 20 MG/ML IV SOLN
200.0000 mg | Freq: Once | INTRAVENOUS | Status: AC
  Administered 2017-07-10: 200 mg via INTRAVENOUS
  Filled 2017-07-10: qty 10

## 2017-07-10 MED ORDER — IRON SUCROSE 20 MG/ML IV SOLN
INTRAVENOUS | Status: AC
  Filled 2017-07-10: qty 10

## 2017-07-10 MED ORDER — SODIUM CHLORIDE 0.9 % IV SOLN
Freq: Once | INTRAVENOUS | Status: AC
  Administered 2017-07-10: 09:00:00 via INTRAVENOUS
  Filled 2017-07-10: qty 1000

## 2017-07-10 NOTE — Telephone Encounter (Signed)
No problems post procedure. 

## 2017-07-10 NOTE — Patient Instructions (Signed)
Iron Sucrose injection What is this medicine? IRON SUCROSE (AHY ern SOO krohs) is an iron complex. Iron is used to make healthy red blood cells, which carry oxygen and nutrients throughout the body. This medicine is used to treat iron deficiency anemia in people with chronic kidney disease. This medicine may be used for other purposes; ask your health care provider or pharmacist if you have questions. COMMON BRAND NAME(S): Venofer What should I tell my health care provider before I take this medicine? They need to know if you have any of these conditions: -anemia not caused by low iron levels -heart disease -high levels of iron in the blood -kidney disease -liver disease -an unusual or allergic reaction to iron, other medicines, foods, dyes, or preservatives -pregnant or trying to get pregnant -breast-feeding How should I use this medicine? This medicine is for infusion into a vein. It is given by a health care professional in a hospital or clinic setting. Talk to your pediatrician regarding the use of this medicine in children. While this drug may be prescribed for children as young as 2 years for selected conditions, precautions do apply. Overdosage: If you think you have taken too much of this medicine contact a poison control center or emergency room at once. NOTE: This medicine is only for you. Do not share this medicine with others. What if I miss a dose? It is important not to miss your dose. Call your doctor or health care professional if you are unable to keep an appointment. What may interact with this medicine? Do not take this medicine with any of the following medications: -deferoxamine -dimercaprol -other iron products This medicine may also interact with the following medications: -chloramphenicol -deferasirox This list may not describe all possible interactions. Give your health care provider a list of all the medicines, herbs, non-prescription drugs, or dietary  supplements you use. Also tell them if you smoke, drink alcohol, or use illegal drugs. Some items may interact with your medicine. What should I watch for while using this medicine? Visit your doctor or healthcare professional regularly. Tell your doctor or healthcare professional if your symptoms do not start to get better or if they get worse. You may need blood work done while you are taking this medicine. You may need to follow a special diet. Talk to your doctor. Foods that contain iron include: whole grains/cereals, dried fruits, beans, or peas, leafy green vegetables, and organ meats (liver, kidney). What side effects may I notice from receiving this medicine? Side effects that you should report to your doctor or health care professional as soon as possible: -allergic reactions like skin rash, itching or hives, swelling of the face, lips, or tongue -breathing problems -changes in blood pressure -cough -fast, irregular heartbeat -feeling faint or lightheaded, falls -fever or chills -flushing, sweating, or hot feelings -joint or muscle aches/pains -seizures -swelling of the ankles or feet -unusually weak or tired Side effects that usually do not require medical attention (report to your doctor or health care professional if they continue or are bothersome): -diarrhea -feeling achy -headache -irritation at site where injected -nausea, vomiting -stomach upset -tiredness This list may not describe all possible side effects. Call your doctor for medical advice about side effects. You may report side effects to FDA at 1-800-FDA-1088. Where should I keep my medicine? This drug is given in a hospital or clinic and will not be stored at home. NOTE: This sheet is a summary. It may not cover all possible information. If   you have questions about this medicine, talk to your doctor, pharmacist, or health care provider.  2018 Elsevier/Gold Standard (2010-10-25 17:14:35)  

## 2017-07-15 ENCOUNTER — Inpatient Hospital Stay: Payer: Medicaid Other

## 2017-07-15 VITALS — BP 103/71 | HR 93 | Temp 98.4°F | Resp 20

## 2017-07-15 DIAGNOSIS — D509 Iron deficiency anemia, unspecified: Secondary | ICD-10-CM

## 2017-07-15 MED ORDER — SODIUM CHLORIDE 0.9 % IV SOLN
Freq: Once | INTRAVENOUS | Status: AC
  Administered 2017-07-15: 09:00:00 via INTRAVENOUS
  Filled 2017-07-15: qty 1000

## 2017-07-15 MED ORDER — IRON SUCROSE 20 MG/ML IV SOLN
200.0000 mg | Freq: Once | INTRAVENOUS | Status: AC
  Administered 2017-07-15: 200 mg via INTRAVENOUS
  Filled 2017-07-15: qty 10

## 2017-07-16 ENCOUNTER — Ambulatory Visit: Payer: Self-pay

## 2017-07-22 ENCOUNTER — Telehealth: Payer: Self-pay | Admitting: Family

## 2017-07-22 ENCOUNTER — Ambulatory Visit: Payer: Self-pay | Admitting: Family

## 2017-07-22 DIAGNOSIS — I119 Hypertensive heart disease without heart failure: Secondary | ICD-10-CM | POA: Insufficient documentation

## 2017-07-22 NOTE — Telephone Encounter (Signed)
Patient did not show for her [facility] appointment on 07/22/17. Will attempt to reschedule.

## 2017-07-23 ENCOUNTER — Inpatient Hospital Stay: Payer: Medicaid Other

## 2017-07-23 VITALS — BP 112/80 | HR 91 | Temp 97.9°F | Resp 20

## 2017-07-23 DIAGNOSIS — D509 Iron deficiency anemia, unspecified: Secondary | ICD-10-CM

## 2017-07-23 MED ORDER — IRON SUCROSE 20 MG/ML IV SOLN
200.0000 mg | Freq: Once | INTRAVENOUS | Status: AC
  Administered 2017-07-23: 200 mg via INTRAVENOUS
  Filled 2017-07-23: qty 10

## 2017-07-23 MED ORDER — SODIUM CHLORIDE 0.9 % IV SOLN
Freq: Once | INTRAVENOUS | Status: AC
  Administered 2017-07-23: 09:00:00 via INTRAVENOUS
  Filled 2017-07-23: qty 1000

## 2017-07-23 MED ORDER — CYANOCOBALAMIN 1000 MCG/ML IJ SOLN
1000.0000 ug | Freq: Once | INTRAMUSCULAR | Status: AC
  Administered 2017-07-23: 1000 ug via INTRAMUSCULAR

## 2017-07-23 NOTE — Patient Instructions (Signed)
Cyanocobalamin, Vitamin B12 injection What is this medicine? CYANOCOBALAMIN (sye an oh koe BAL a min) is a man made form of vitamin B12. Vitamin B12 is used in the growth of healthy blood cells, nerve cells, and proteins in the body. It also helps with the metabolism of fats and carbohydrates. This medicine is used to treat people who can not absorb vitamin B12. This medicine may be used for other purposes; ask your health care provider or pharmacist if you have questions. COMMON BRAND NAME(S): B-12 Compliance Kit, B-12 Injection Kit, Cyomin, LA-12, Nutri-Twelve, Physicians EZ Use B-12, Primabalt What should I tell my health care provider before I take this medicine? They need to know if you have any of these conditions: -kidney disease -Leber's disease -megaloblastic anemia -an unusual or allergic reaction to cyanocobalamin, cobalt, other medicines, foods, dyes, or preservatives -pregnant or trying to get pregnant -breast-feeding How should I use this medicine? This medicine is injected into a muscle or deeply under the skin. It is usually given by a health care professional in a clinic or doctor's office. However, your doctor may teach you how to inject yourself. Follow all instructions. Talk to your pediatrician regarding the use of this medicine in children. Special care may be needed. Overdosage: If you think you have taken too much of this medicine contact a poison control center or emergency room at once. NOTE: This medicine is only for you. Do not share this medicine with others. What if I miss a dose? If you are given your dose at a clinic or doctor's office, call to reschedule your appointment. If you give your own injections and you miss a dose, take it as soon as you can. If it is almost time for your next dose, take only that dose. Do not take double or extra doses. What may interact with this medicine? -colchicine -heavy alcohol intake This list may not describe all possible  interactions. Give your health care provider a list of all the medicines, herbs, non-prescription drugs, or dietary supplements you use. Also tell them if you smoke, drink alcohol, or use illegal drugs. Some items may interact with your medicine. What should I watch for while using this medicine? Visit your doctor or health care professional regularly. You may need blood work done while you are taking this medicine. You may need to follow a special diet. Talk to your doctor. Limit your alcohol intake and avoid smoking to get the best benefit. What side effects may I notice from receiving this medicine? Side effects that you should report to your doctor or health care professional as soon as possible: -allergic reactions like skin rash, itching or hives, swelling of the face, lips, or tongue -blue tint to skin -chest tightness, pain -difficulty breathing, wheezing -dizziness -red, swollen painful area on the leg Side effects that usually do not require medical attention (report to your doctor or health care professional if they continue or are bothersome): -diarrhea -headache This list may not describe all possible side effects. Call your doctor for medical advice about side effects. You may report side effects to FDA at 1-800-FDA-1088. Where should I keep my medicine? Keep out of the reach of children. Store at room temperature between 15 and 30 degrees C (59 and 85 degrees F). Protect from light. Throw away any unused medicine after the expiration date. NOTE: This sheet is a summary. It may not cover all possible information. If you have questions about this medicine, talk to your doctor, pharmacist, or   health care provider.  2018 Elsevier/Gold Standard (2007-04-27 22:10:20) Ferumoxytol injection What is this medicine? FERUMOXYTOL is an iron complex. Iron is used to make healthy red blood cells, which carry oxygen and nutrients throughout the body. This medicine is used to treat iron  deficiency anemia in people with chronic kidney disease. This medicine may be used for other purposes; ask your health care provider or pharmacist if you have questions. COMMON BRAND NAME(S): Feraheme What should I tell my health care provider before I take this medicine? They need to know if you have any of these conditions: -anemia not caused by low iron levels -high levels of iron in the blood -magnetic resonance imaging (MRI) test scheduled -an unusual or allergic reaction to iron, other medicines, foods, dyes, or preservatives -pregnant or trying to get pregnant -breast-feeding How should I use this medicine? This medicine is for injection into a vein. It is given by a health care professional in a hospital or clinic setting. Talk to your pediatrician regarding the use of this medicine in children. Special care may be needed. Overdosage: If you think you have taken too much of this medicine contact a poison control center or emergency room at once. NOTE: This medicine is only for you. Do not share this medicine with others. What if I miss a dose? It is important not to miss your dose. Call your doctor or health care professional if you are unable to keep an appointment. What may interact with this medicine? This medicine may interact with the following medications: -other iron products This list may not describe all possible interactions. Give your health care provider a list of all the medicines, herbs, non-prescription drugs, or dietary supplements you use. Also tell them if you smoke, drink alcohol, or use illegal drugs. Some items may interact with your medicine. What should I watch for while using this medicine? Visit your doctor or healthcare professional regularly. Tell your doctor or healthcare professional if your symptoms do not start to get better or if they get worse. You may need blood work done while you are taking this medicine. You may need to follow a special diet. Talk  to your doctor. Foods that contain iron include: whole grains/cereals, dried fruits, beans, or peas, leafy green vegetables, and organ meats (liver, kidney). What side effects may I notice from receiving this medicine? Side effects that you should report to your doctor or health care professional as soon as possible: -allergic reactions like skin rash, itching or hives, swelling of the face, lips, or tongue -breathing problems -changes in blood pressure -feeling faint or lightheaded, falls -fever or chills -flushing, sweating, or hot feelings -swelling of the ankles or feet Side effects that usually do not require medical attention (report to your doctor or health care professional if they continue or are bothersome): -diarrhea -headache -nausea, vomiting -stomach pain This list may not describe all possible side effects. Call your doctor for medical advice about side effects. You may report side effects to FDA at 1-800-FDA-1088. Where should I keep my medicine? This drug is given in a hospital or clinic and will not be stored at home. NOTE: This sheet is a summary. It may not cover all possible information. If you have questions about this medicine, talk to your doctor, pharmacist, or health care provider.  2018 Elsevier/Gold Standard (2015-02-16 12:41:49)

## 2017-08-07 ENCOUNTER — Ambulatory Visit
Payer: Medicaid Other | Attending: Student in an Organized Health Care Education/Training Program | Admitting: Student in an Organized Health Care Education/Training Program

## 2017-08-07 ENCOUNTER — Other Ambulatory Visit: Payer: Self-pay

## 2017-08-07 ENCOUNTER — Encounter: Payer: Self-pay | Admitting: Student in an Organized Health Care Education/Training Program

## 2017-08-07 VITALS — BP 136/94 | HR 88 | Temp 98.2°F | Resp 16 | Ht 61.0 in | Wt 366.0 lb

## 2017-08-07 DIAGNOSIS — M25562 Pain in left knee: Secondary | ICD-10-CM | POA: Insufficient documentation

## 2017-08-07 DIAGNOSIS — M25561 Pain in right knee: Secondary | ICD-10-CM | POA: Insufficient documentation

## 2017-08-07 DIAGNOSIS — M79604 Pain in right leg: Secondary | ICD-10-CM | POA: Insufficient documentation

## 2017-08-07 DIAGNOSIS — M419 Scoliosis, unspecified: Secondary | ICD-10-CM | POA: Diagnosis not present

## 2017-08-07 DIAGNOSIS — M5442 Lumbago with sciatica, left side: Secondary | ICD-10-CM

## 2017-08-07 DIAGNOSIS — M545 Low back pain: Secondary | ICD-10-CM | POA: Insufficient documentation

## 2017-08-07 DIAGNOSIS — G894 Chronic pain syndrome: Secondary | ICD-10-CM

## 2017-08-07 DIAGNOSIS — Z87891 Personal history of nicotine dependence: Secondary | ICD-10-CM | POA: Diagnosis not present

## 2017-08-07 DIAGNOSIS — Z9851 Tubal ligation status: Secondary | ICD-10-CM | POA: Diagnosis not present

## 2017-08-07 DIAGNOSIS — R000 Tachycardia, unspecified: Secondary | ICD-10-CM | POA: Diagnosis not present

## 2017-08-07 DIAGNOSIS — Z79899 Other long term (current) drug therapy: Secondary | ICD-10-CM | POA: Insufficient documentation

## 2017-08-07 DIAGNOSIS — M5441 Lumbago with sciatica, right side: Secondary | ICD-10-CM | POA: Diagnosis not present

## 2017-08-07 DIAGNOSIS — F329 Major depressive disorder, single episode, unspecified: Secondary | ICD-10-CM | POA: Insufficient documentation

## 2017-08-07 DIAGNOSIS — M79605 Pain in left leg: Secondary | ICD-10-CM | POA: Diagnosis not present

## 2017-08-07 DIAGNOSIS — G8929 Other chronic pain: Secondary | ICD-10-CM | POA: Diagnosis not present

## 2017-08-07 DIAGNOSIS — I5032 Chronic diastolic (congestive) heart failure: Secondary | ICD-10-CM | POA: Insufficient documentation

## 2017-08-07 DIAGNOSIS — G4733 Obstructive sleep apnea (adult) (pediatric): Secondary | ICD-10-CM | POA: Insufficient documentation

## 2017-08-07 DIAGNOSIS — D509 Iron deficiency anemia, unspecified: Secondary | ICD-10-CM | POA: Diagnosis not present

## 2017-08-07 DIAGNOSIS — R768 Other specified abnormal immunological findings in serum: Secondary | ICD-10-CM | POA: Diagnosis not present

## 2017-08-07 DIAGNOSIS — Z79891 Long term (current) use of opiate analgesic: Secondary | ICD-10-CM | POA: Diagnosis not present

## 2017-08-07 DIAGNOSIS — M546 Pain in thoracic spine: Secondary | ICD-10-CM | POA: Insufficient documentation

## 2017-08-07 DIAGNOSIS — E876 Hypokalemia: Secondary | ICD-10-CM | POA: Insufficient documentation

## 2017-08-07 DIAGNOSIS — R079 Chest pain, unspecified: Secondary | ICD-10-CM | POA: Diagnosis not present

## 2017-08-07 DIAGNOSIS — Z6841 Body Mass Index (BMI) 40.0 and over, adult: Secondary | ICD-10-CM | POA: Insufficient documentation

## 2017-08-07 MED ORDER — DICLOFENAC SODIUM 75 MG PO TBEC: 75.0000 mg | DELAYED_RELEASE_TABLET | Freq: Two times a day (BID) | ORAL | 1 refills | Status: DC

## 2017-08-07 NOTE — Progress Notes (Signed)
Safety precautions to be maintained throughout the outpatient stay will include: orient to surroundings, keep bed in low position, maintain call bell within reach at all times, provide assistance with transfer out of bed and ambulation.  

## 2017-08-07 NOTE — Progress Notes (Signed)
Patient's Name: Tracey White  MRN: [id number]  Referring Provider: Zeb Comfort, MD  DOB: 01-09-1981  PCP: Zeb Comfort, MD  DOS: 08/07/2017  Note by: Gillis Santa, MD  Service setting: Ambulatory outpatient  Specialty: Interventional Pain Management  Location: [facility] (AMB) Pain Management Facility    Patient type: Established   Primary Reason(s) for Visit: Encounter for post-procedure evaluation of chronic illness with mild to moderate exacerbation CC: Knee Pain (bilaterally)  HPI  Tracey White is a 37 y.o. year old, female patient, who comes today for a post-procedure evaluation. She has Chest pain at rest; Chronic diastolic heart failure ([place]); Tachycardia; Obstructive sleep apnea on CPAP; Tobacco use; Chest pain; Hypokalemia; Depression; Chronic low back pain (Primary Area of Pain) (Bilateral) (L>R); Skin lesion of left leg; Lymphedema; Chronic knee pain (Secondary Area of Pain) (Bilateral) (R>L); Chronic thoracic back pain (Fourth Area of Pain) (Bilateral) (R>L); Chronic pain syndrome; Disorder of skeletal system; Other long term (current) drug therapy; Scoliosis; Asthma without status asthmaticus; Anemia; Acute diastolic heart failure ([place]); Neurogenic pain; Failed back surgical syndrome; Pharmacologic therapy; Problems influencing health status; Chronic lower extremity pain (Tertiary Area of Pain) (Bilateral) (R>L); Elevated C-reactive protein (CRP); Elevated sed rate; Vitamin D deficiency; Class 3 obesity with alveolar hypoventilation, serious comorbidity, and body mass index (BMI) of 60.0 to 69.9 in adult (HCC); ANA positive; Positive antinuclear antibody; Iron deficiency anemia; B12 deficiency; Goals of care, counseling/discussion; AIDS ([place]); and Long term current use of opiate analgesic on their problem list. Her primarily concern today is the Knee Pain (bilaterally)  Pain Assessment: Location: Right Knee Radiating: up to lower back Onset:   Duration: Chronic pain Quality: Constant,  Sharp, Aching, Throbbing Severity: 7 /10 (subjective, self-reported pain score)  Note: Reported level is compatible with observation.                         When using our objective Pain Scale, levels between 6 and 10/10 are said to belong in an emergency room, as it progressively worsens from a 6/10, described as severely limiting, requiring emergency care not usually available at an outpatient pain management facility. At a 6/10 level, communication becomes difficult and requires great effort. Assistance to reach the emergency department may be required. Facial flushing and profuse sweating along with potentially dangerous increases in heart rate and blood pressure will be evident. Effect on ADL: walking is limited Timing: Constant Modifying factors: medications, ice, heat, rest, topical gel BP: (!) 136/94  HR: 88  Tracey White comes in today for post-procedure evaluation after the treatment done on 07/10/2017.  Further details on both, my assessment(s), as well as the proposed treatment plan, please see below.  Post-Procedure Assessment  07/09/2017 Procedure: Bilateral interarticular Hyalgan injection #3 Pre-procedure pain score:  7/10 Post-procedure pain score: 7/10         Influential Factors: BMI: 69.16 kg/m Intra-procedural challenges: None observed.         Assessment challenges: None detected.              Reported side-effects: None.        Post-procedural adverse reactions or complications: None reported         Sedation: Please see nurses note. When no sedatives are used, the analgesic levels obtained are directly associated to the effectiveness of the local anesthetics. However, when sedation is provided, the level of analgesia obtained during the initial 1 hour following the intervention, is believed to be the  result of a combination of factors. These factors may include, but are not limited to: 1. The effectiveness of the local anesthetics used. 2. The effects of the  analgesic(s) and/or anxiolytic(s) used. 3. The degree of discomfort experienced by the patient at the time of the procedure. 4. The patients ability and reliability in recalling and recording the events. 5. The presence and influence of possible secondary gains and/or psychosocial factors. Reported result: Relief experienced during the 1st hour after the procedure: 100 % (Ultra-Short Term Relief)            Interpretative annotation: Clinically appropriate result. Analgesia during this period is likely to be Local Anesthetic and/or IV Sedative (Analgesic/Anxiolytic) related.           Long-term benefit: Defined as the period of time past the expected duration of local anesthetics (1 hour for short-acting and 4-6 hours for long-acting). With the possible exception of prolonged sympathetic blockade from the local anesthetics, benefits during this period are typically attributed to, or associated with, other factors such as analgesic sensory neuropraxia, antiinflammatory effects, or beneficial biochemical changes provided by agents other than the local anesthetics.  Reported result: Extended relief following procedure: 35 % (Long-Term Relief)            Interpretative annotation: Clinically appropriate result. Good relief. No permanent benefit expected. Inflammation plays a part in the etiology to the pain.          Current benefits: Defined as reported results that persistent at this point in time.   Analgesia: 25 %            Function: Somewhat improved ROM: Somewhat improved Interpretative annotation: Recurrence of symptoms. Limited therapeutic benefit. Results would suggest persistent aggravating factors.          Interpretation: Results would suggest therapy to have a limited impact on the patient's condition.                  Plan:  Please see "Plan of Care" for details.                Laboratory Chemistry  Inflammation Markers (CRP: Acute Phase) (ESR: Chronic Phase) Lab Results   Component Value Date   CRP 58.7 (H) 12/12/2016   ESRSEDRATE 30 (H) 06/25/2017                         Rheumatology Markers Lab Results  Component Value Date   RF <10.0 01/08/2017   ANA Positive (A) 01/08/2017                        Renal Function Markers Lab Results  Component Value Date   BUN 10 03/24/2017   CREATININE 0.74 03/24/2017   BCR 15 12/12/2016   GFRAA >60 03/24/2017   GFRNONAA >60 03/24/2017                             Hepatic Function Markers Lab Results  Component Value Date   AST 15 03/24/2017   ALT 11 (L) 03/24/2017   ALBUMIN 3.5 03/24/2017   ALKPHOS 110 03/24/2017                        Electrolytes Lab Results  Component Value Date   NA 134 (L) 03/24/2017   K 3.3 (L) 03/24/2017   CL 99 (L) 03/24/2017  CALCIUM 8.7 (L) 03/24/2017   MG 2.1 12/12/2016                        Neuropathy Markers Lab Results  Component Value Date   VITAMINB12 210 03/19/2017   FOLATE 8.1 06/25/2017   HGBA1C 6.1 (H) 05/31/2016   HIV Non Reactive 12/25/2016                        Bone Pathology Markers Lab Results  Component Value Date   25OHVITD1 9.1 (L) 12/12/2016   25OHVITD2 3.6 12/12/2016   25OHVITD3 5.5 12/12/2016                         Coagulation Parameters Lab Results  Component Value Date   PLT 239 05/07/2017   DDIMER <0.27 12/24/2016                        Cardiovascular Markers Lab Results  Component Value Date   BNP 17.0 03/24/2017   TROPONINI <0.03 03/24/2017   HGB 10.7 (L) 05/07/2017   HCT 33.8 (L) 05/07/2017                         CA Markers No results found for: CEA, CA125, LABCA2                      Note: Lab results reviewed.  Recent Diagnostic Imaging Results  DG Chest 2 View CLINICAL DATA:  Shortness of breath and cough  EXAM: CHEST  2 VIEW  COMPARISON:  December 30, 2016  FINDINGS: There is no edema or consolidation. Heart size and pulmonary vascularity are normal. No adenopathy. Patient is status post fixation  rod placement for lower thoracic dextroscoliosis.  IMPRESSION: No edema or consolidation.  Stable cardiac silhouette.  Electronically Signed   By: Lowella Grip III M.D.   On: 03/24/2017 13:14  Complexity Note: Imaging results reviewed. Results shared with Tracey White, using Layman's terms.                         Meds   Current Outpatient Medications:  .  albuterol (PROVENTIL HFA;VENTOLIN HFA) 108 (90 Base) MCG/ACT inhaler, Inhale 2 puffs into the lungs every 4 (four) hours as needed for wheezing or shortness of breath., Disp: 1 Inhaler, Rfl: 1 .  ARIPiprazole (ABILIFY) 2 MG tablet, Take 10 mg by mouth daily. , Disp: , Rfl:  .  budesonide-formoterol (SYMBICORT) 160-4.5 MCG/ACT inhaler, Inhale 2 puffs into the lungs 2 (two) times daily., Disp: , Rfl:  .  bumetanide (BUMEX) 2 MG tablet, Take 2 mg by mouth 2 (two) times daily. , Disp: , Rfl:  .  buPROPion (WELLBUTRIN) 75 MG tablet, Take 150 mg by mouth daily. , Disp: , Rfl:  .  diclofenac sodium (VOLTAREN) 1 % GEL, Apply 4 (four) times daily topically., Disp: , Rfl:  .  diltiazem (CARDIZEM) 120 MG tablet, Take 120 mg daily by mouth., Disp: , Rfl:  .  escitalopram (LEXAPRO) 20 MG tablet, Take 20 mg by mouth daily. , Disp: , Rfl:  .  ferrous sulfate 325 (65 FE) MG tablet, Take 325 mg by mouth daily with breakfast., Disp: , Rfl:  .  lidocaine (LIDODERM) 5 %, Place 1 patch daily onto the skin. Remove & Discard patch within 12 hours or  as directed by MD, Disp: , Rfl:  .  losartan (COZAAR) 25 MG tablet, Take 1 tablet (25 mg total) by mouth daily., Disp: 90 tablet, Rfl: 3 .  meloxicam (MOBIC) 15 MG tablet, Take 15 mg by mouth daily as needed for pain., Disp: , Rfl:  .  metoprolol succinate (TOPROL-XL) 50 MG 24 hr tablet, Take 1 tablet (50 mg total) by mouth daily. Take with or immediately following a meal., Disp: 90 tablet, Rfl: 3 .  montelukast (SINGULAIR) 10 MG tablet, Take 10 mg by mouth at bedtime., Disp: , Rfl:  .  potassium chloride SA  (K-DUR,KLOR-CON) 20 MEQ tablet, Take 40 mEq by mouth 2 (two) times daily., Disp: , Rfl:  .  traMADol (ULTRAM) 50 MG tablet, Take 1 tablet (50 mg total) by mouth every 8 (eight) hours as needed for severe pain., Disp: 90 tablet, Rfl: 2 .  Vitamin D, Ergocalciferol, (DRISDOL) 50000 units CAPS capsule, Take 50,000 Units by mouth every 7 (seven) days., Disp: , Rfl:  .  diclofenac (VOLTAREN) 75 MG EC tablet, Take 1 tablet (75 mg total) by mouth 2 (two) times daily after a meal., Disp: 60 tablet, Rfl: 1 .  norgestimate-ethinyl estradiol (SPRINTEC 28) 0.25-35 MG-MCG tablet, Take 1 tablet by mouth daily., Disp: , Rfl:  No current facility-administered medications for this visit.   Facility-Administered Medications Ordered in Other Visits:  .  technetium tetrofosmin (TC-MYOVIEW) injection 53.66 millicurie, 44.03 millicurie, Intravenous, Once PRN, [place], David A, MD  ROS  Constitutional: Denies any fever or chills Gastrointestinal: No reported hemesis, hematochezia, vomiting, or acute GI distress Musculoskeletal: Denies any acute onset joint swelling, redness, loss of ROM, or weakness Neurological: No reported episodes of acute onset apraxia, aphasia, dysarthria, agnosia, amnesia, paralysis, loss of coordination, or loss of consciousness  Allergies  Tracey White has No Known Allergies.  [place]  Drug: Tracey White  reports that she does not use drugs. Alcohol:  reports that she drinks about 0.6 - 1.2 oz of alcohol per week. Tobacco:  reports that she quit smoking about 5 months ago. Her smoking use included cigarettes. She smoked 0.20 packs per day. She has never used smokeless tobacco. Medical:  has a past medical history of Anxiety, CHF (congestive heart failure) ([place]), Chronic heart failure ([place]), Morbid obesity ([place]), OSA (obstructive sleep apnea), Oxygen deficiency (02/28/2017), Peripheral edema, and Scoliosis. Surgical: Tracey White  has a past surgical history that includes Tubal ligation; Hernia repair;  and scoliosis repair. Family: family history includes Cancer in her maternal grandmother; Diabetes in her mother; Hypertension in her mother.  Constitutional Exam  General appearance: alert, cooperative and morbidly obese Vitals:   08/07/17 0939  BP: (!) 136/94  Pulse: 88  Resp: 16  Temp: 98.2 F (36.8 C)  TempSrc: Oral  SpO2: 97%  Weight: (!) 366 lb (166 kg)  Height: _0  (1.549 m)   BMI Assessment: Estimated body mass index is 69.16 kg/m as calculated from the following:   Height as of this encounter: _1  (1.549 m).   Weight as of this encounter: 366 lb (166 kg).  BMI interpretation table: BMI level Category Range association with higher incidence of chronic pain  <18 kg/m2 Underweight   18.5-24.9 kg/m2 Ideal body weight   25-29.9 kg/m2 Overweight Increased incidence by 20%  30-34.9 kg/m2 Obese (Class I) Increased incidence by 68%  35-39.9 kg/m2 Severe obesity (Class II) Increased incidence by 136%  >40 kg/m2 Extreme obesity (Class III) Increased incidence by 254%   Patient's current  BMI Ideal Body weight  Body mass index is 69.16 kg/m. Ideal body weight: 47.8 kg (105 lb 6.1 oz) Adjusted ideal body weight: 95.1 kg (209 lb 10.1 oz)   BMI Readings from Last 4 Encounters:  08/07/17 69.16 kg/m  07/09/17 68.21 kg/m  06/25/17 69.34 kg/m  06/11/17 69.53 kg/m   Wt Readings from Last 4 Encounters:  08/07/17 (!) 366 lb (166 kg)  07/09/17 (!) 361 lb (163.7 kg)  06/25/17 (!) 367 lb (166.5 kg)  06/11/17 (!) 368 lb (166.9 kg)  Psych/Mental status: Alert, oriented x 3 (person, place, & time)       Eyes: PERLA Respiratory: No evidence of acute respiratory distress  Cervical Spine Area Exam  Skin & Axial Inspection: No masses, redness, edema, swelling, or associated skin lesions Alignment: Symmetrical Functional ROM: Unrestricted ROM      Stability: No instability detected Muscle Tone/Strength: Functionally intact. No obvious neuro-muscular anomalies detected. Sensory  (Neurological): Unimpaired Palpation: No palpable anomalies              Upper Extremity (UE) Exam    Side: Right upper extremity  Side: Left upper extremity  Skin & Extremity Inspection: Skin color, temperature, and hair growth are WNL. No peripheral edema or cyanosis. No masses, redness, swelling, asymmetry, or associated skin lesions. No contractures.  Skin & Extremity Inspection: Skin color, temperature, and hair growth are WNL. No peripheral edema or cyanosis. No masses, redness, swelling, asymmetry, or associated skin lesions. No contractures.  Functional ROM: Unrestricted ROM          Functional ROM: Unrestricted ROM          Muscle Tone/Strength: Functionally intact. No obvious neuro-muscular anomalies detected.  Muscle Tone/Strength: Functionally intact. No obvious neuro-muscular anomalies detected.  Sensory (Neurological): Unimpaired          Sensory (Neurological): Unimpaired          Palpation: No palpable anomalies              Palpation: No palpable anomalies              Provocative Test(s):  Phalen's test: deferred Tinel's test: deferred Apley's scratch test (touch opposite shoulder):  Action 1 (Across chest): deferred Action 2 (Overhead): deferred Action 3 (LB reach): deferred   Provocative Test(s):  Phalen's test: deferred Tinel's test: deferred Apley's scratch test (touch opposite shoulder):  Action 1 (Across chest): deferred Action 2 (Overhead): deferred Action 3 (LB reach): deferred    Thoracic Spine Area Exam  Skin & Axial Inspection: No masses, redness, or swelling Alignment: Symmetrical Functional ROM: Unrestricted ROM Stability: No instability detected Muscle Tone/Strength: Functionally intact. No obvious neuro-muscular anomalies detected. Sensory (Neurological): Unimpaired Muscle strength & Tone: No palpable anomalies  Lumbar Spine Area Exam  Skin & Axial Inspection: No masses, redness, or swelling Alignment: Symmetrical Functional ROM: Unrestricted ROM        Stability: No instability detected Muscle Tone/Strength: Functionally intact. No obvious neuro-muscular anomalies detected. Sensory (Neurological): Unimpaired Palpation: No palpable anomalies       Provocative Tests: Lumbar Hyperextension/rotation test: deferred today       Lumbar quadrant test (Kemp's test): deferred today       Lumbar Lateral bending test: deferred today       Patrick's Maneuver: deferred today                   FABER test: deferred today  Thigh-thrust test: deferred today       S-I compression test: deferred today       S-I distraction test: deferred today        Gait & Posture Assessment  Ambulation: Unassisted Gait: Relatively normal for age and body habitus Posture: WNL   Lower Extremity Exam    Side: Right lower extremity  Side: Left lower extremity  Stability: No instability observed          Stability: No instability observed          Skin & Extremity Inspection: Skin color, temperature, and hair growth are WNL. No peripheral edema or cyanosis. No masses, redness, swelling, asymmetry, or associated skin lesions. No contractures.  Skin & Extremity Inspection: Skin color, temperature, and hair growth are WNL. No peripheral edema or cyanosis. No masses, redness, swelling, asymmetry, or associated skin lesions. No contractures.  Functional ROM: Decreased ROM for all joints of the lower extremity          Functional ROM: Decreased ROM for all joints of the lower extremity          Muscle Tone/Strength: Functionally intact. No obvious neuro-muscular anomalies detected.  Muscle Tone/Strength: Functionally intact. No obvious neuro-muscular anomalies detected.  Sensory (Neurological): Arthropathic arthralgia  Sensory (Neurological): Arthropathic arthralgia  Palpation: No palpable anomalies  Palpation: No palpable anomalies   Assessment  Primary Diagnosis & Pertinent Problem List: The primary encounter diagnosis was Chronic knee pain (Secondary  Area of Pain) (Bilateral) (R>L). Diagnoses of Chronic lower extremity pain (Tertiary Area of Pain) (Bilateral) (R>L), Positive antinuclear antibody, Chronic low back pain (Primary Area of Pain) (Bilateral) (L>R), and Chronic pain syndrome were also pertinent to this visit.  Status Diagnosis  Stable Stable Persistent 1. Chronic knee pain (Secondary Area of Pain) (Bilateral) (R>L)   2. Chronic lower extremity pain (Tertiary Area of Pain) (Bilateral) (R>L)   3. Positive antinuclear antibody   4. Chronic low back pain (Primary Area of Pain) (Bilateral) (L>R)   5. Chronic pain syndrome     General Recommendations: The pain condition that the patient suffers from is best treated with a multidisciplinary approach that involves an increase in physical activity to prevent de-conditioning and worsening of the pain cycle, as well as psychological counseling (formal and/or informal) to address the co-morbid psychological affects of pain. Treatment will often involve judicious use of pain medications and interventional procedures to decrease the pain, allowing the patient to participate in the physical activity that will ultimately produce long-lasting pain reductions. The goal of the multidisciplinary approach is to return the patient to a higher level of overall function and to restore their ability to perform activities of daily living.  37 year old female that she complaint of bilateral knee pain secondary to morbid obesity and bilateral primary knee osteoarthritis status post series of 3 intra-articular knee Hyalgan injections only resulted in approximately 25 to 30% pain relief.  Patient states that she is continued to have moderate to significant knee pain and is also endorsing pain that is radiating up from her knee into her proximal thigh.  Do not recommend continuing with 2 additional intra-articular Hyalgan injections.  We will prescribe the patient diclofenac 75 mg twice daily for knee osteoarthritic  symptoms.  Patient instructed to not take any other NSAIDs while she is on diclofenac and to take after a meal.  Patient has any GI discomfort or indigestion all NSAIDs I recommended that she obtain Prilosec OTC she can take in the morning to help with  her GI symptoms.  Plan of Care  Pharmacotherapy (Medications Ordered): Meds ordered this encounter  Medications  . diclofenac (VOLTAREN) 75 MG EC tablet    Sig: Take 1 tablet (75 mg total) by mouth 2 (two) times daily after a meal.    Dispense:  60 tablet    Refill:  1    Provider-requested follow-up: Follow with Dionisio David as below Time Note: Greater than 50% of the 15 minute(s) of face-to-face time spent with Tracey White, was spent in counseling/coordination of care regarding: Tracey White primary cause of pain, the significance of each one oth the test(s) anomalies and it's corresponding characteristic pain pattern(s), the treatment plan, medication side effects, the results, interpretation and significance of  her recent diagnostic interventional treatment(s), realistic expectations, the goals of pain management (increased in functionality) and the need to bring and keep the BMI below 30. Future Appointments  Date Time Provider [place]  08/19/2017  8:30 AM [facility] LAB [facility] None  08/20/2017  8:30 AM Karen Kitchens, NP [facility] None  08/20/2017  9:00 AM [facility] INJECTION [facility] None  09/02/2017  8:45 AM Vevelyn Francois, NP [facility] None  09/17/2017  8:30 AM [facility] INJECTION [facility] None  10/15/2017  8:30 AM [facility] INJECTION [facility] None  11/12/2017  8:30 AM [facility] INJECTION [facility] None  11/26/2017 10:00 AM [place], Raiford Simmonds, CNM [facility] None    Primary Care Physician: Zeb Comfort, MD Location: [facility] Note by: Gillis Santa, M.D Date: 08/07/2017; Time: 1:15 PM  There are no Patient Instructions on file for this visit.

## 2017-08-14 ENCOUNTER — Other Ambulatory Visit: Payer: Self-pay | Admitting: *Deleted

## 2017-08-14 DIAGNOSIS — D500 Iron deficiency anemia secondary to blood loss (chronic): Secondary | ICD-10-CM

## 2017-08-19 ENCOUNTER — Inpatient Hospital Stay: Payer: Medicaid Other | Attending: Hematology and Oncology

## 2017-08-19 DIAGNOSIS — I509 Heart failure, unspecified: Secondary | ICD-10-CM | POA: Insufficient documentation

## 2017-08-19 DIAGNOSIS — E538 Deficiency of other specified B group vitamins: Secondary | ICD-10-CM | POA: Insufficient documentation

## 2017-08-19 DIAGNOSIS — Z9989 Dependence on other enabling machines and devices: Secondary | ICD-10-CM | POA: Insufficient documentation

## 2017-08-19 DIAGNOSIS — R5383 Other fatigue: Secondary | ICD-10-CM | POA: Diagnosis not present

## 2017-08-19 DIAGNOSIS — R0609 Other forms of dyspnea: Secondary | ICD-10-CM | POA: Insufficient documentation

## 2017-08-19 DIAGNOSIS — Z9981 Dependence on supplemental oxygen: Secondary | ICD-10-CM | POA: Insufficient documentation

## 2017-08-19 DIAGNOSIS — G4733 Obstructive sleep apnea (adult) (pediatric): Secondary | ICD-10-CM | POA: Diagnosis not present

## 2017-08-19 DIAGNOSIS — R635 Abnormal weight gain: Secondary | ICD-10-CM | POA: Insufficient documentation

## 2017-08-19 DIAGNOSIS — Z79899 Other long term (current) drug therapy: Secondary | ICD-10-CM | POA: Diagnosis not present

## 2017-08-19 DIAGNOSIS — F419 Anxiety disorder, unspecified: Secondary | ICD-10-CM | POA: Diagnosis not present

## 2017-08-19 DIAGNOSIS — Z87891 Personal history of nicotine dependence: Secondary | ICD-10-CM | POA: Diagnosis not present

## 2017-08-19 DIAGNOSIS — D509 Iron deficiency anemia, unspecified: Secondary | ICD-10-CM | POA: Diagnosis present

## 2017-08-19 DIAGNOSIS — D500 Iron deficiency anemia secondary to blood loss (chronic): Secondary | ICD-10-CM

## 2017-08-19 LAB — CBC WITH DIFFERENTIAL/PLATELET
Basophils Absolute: 0 10*3/uL (ref 0–0.1)
Basophils Relative: 0 %
EOS ABS: 0.2 10*3/uL (ref 0–0.7)
EOS PCT: 2 %
HCT: 39.8 % (ref 35.0–47.0)
HEMOGLOBIN: 12.5 g/dL (ref 12.0–16.0)
LYMPHS ABS: 1.8 10*3/uL (ref 1.0–3.6)
LYMPHS PCT: 22 %
MCH: 24.6 pg — AB (ref 26.0–34.0)
MCHC: 31.3 g/dL — AB (ref 32.0–36.0)
MCV: 78.6 fL — ABNORMAL LOW (ref 80.0–100.0)
MONO ABS: 0.6 10*3/uL (ref 0.2–0.9)
Monocytes Relative: 8 %
Neutro Abs: 5.4 10*3/uL (ref 1.4–6.5)
Neutrophils Relative %: 68 %
PLATELETS: 214 10*3/uL (ref 150–440)
RBC: 5.07 MIL/uL (ref 3.80–5.20)
RDW: 17.8 % — AB (ref 11.5–14.5)
WBC: 7.9 10*3/uL (ref 3.6–11.0)

## 2017-08-19 LAB — FERRITIN: Ferritin: 79 ng/mL (ref 11–307)

## 2017-08-20 ENCOUNTER — Inpatient Hospital Stay (HOSPITAL_BASED_OUTPATIENT_CLINIC_OR_DEPARTMENT_OTHER): Payer: Medicaid Other | Admitting: Hematology and Oncology

## 2017-08-20 ENCOUNTER — Encounter: Payer: Self-pay | Admitting: Hematology and Oncology

## 2017-08-20 ENCOUNTER — Inpatient Hospital Stay: Payer: Medicaid Other

## 2017-08-20 VITALS — BP 144/72 | HR 83 | Temp 98.3°F | Resp 20 | Wt 368.2 lb

## 2017-08-20 VITALS — BP 102/69 | HR 80 | Temp 98.0°F | Resp 21

## 2017-08-20 DIAGNOSIS — R5383 Other fatigue: Secondary | ICD-10-CM

## 2017-08-20 DIAGNOSIS — Z87891 Personal history of nicotine dependence: Secondary | ICD-10-CM

## 2017-08-20 DIAGNOSIS — Z9981 Dependence on supplemental oxygen: Secondary | ICD-10-CM

## 2017-08-20 DIAGNOSIS — D509 Iron deficiency anemia, unspecified: Secondary | ICD-10-CM

## 2017-08-20 DIAGNOSIS — R635 Abnormal weight gain: Secondary | ICD-10-CM

## 2017-08-20 DIAGNOSIS — R0609 Other forms of dyspnea: Secondary | ICD-10-CM | POA: Diagnosis not present

## 2017-08-20 DIAGNOSIS — F419 Anxiety disorder, unspecified: Secondary | ICD-10-CM

## 2017-08-20 DIAGNOSIS — I509 Heart failure, unspecified: Secondary | ICD-10-CM

## 2017-08-20 DIAGNOSIS — E538 Deficiency of other specified B group vitamins: Secondary | ICD-10-CM

## 2017-08-20 DIAGNOSIS — Z79899 Other long term (current) drug therapy: Secondary | ICD-10-CM

## 2017-08-20 DIAGNOSIS — Z9989 Dependence on other enabling machines and devices: Secondary | ICD-10-CM

## 2017-08-20 DIAGNOSIS — D500 Iron deficiency anemia secondary to blood loss (chronic): Secondary | ICD-10-CM

## 2017-08-20 DIAGNOSIS — G4733 Obstructive sleep apnea (adult) (pediatric): Secondary | ICD-10-CM

## 2017-08-20 MED ORDER — CYANOCOBALAMIN 1000 MCG/ML IJ SOLN
1000.0000 ug | Freq: Once | INTRAMUSCULAR | Status: AC
  Administered 2017-08-20: 1000 ug via INTRAMUSCULAR

## 2017-08-20 NOTE — Progress Notes (Signed)
Patient offers no complaints today.  However, O2 was @ 88% when she walked from waiting area to exam room.  After resting it increased to 97%.  Patient states her PCP just decreased her to 1 L O2 at home.  When patient first got to exam room she was having audible wheezing.

## 2017-08-20 NOTE — Progress Notes (Signed)
[facility] day:  08/20/2017  Chief Complaint: Tracey White is a 37 y.o. female with iron deficiency anemia and B12 deficiency who is seen for 3 month assessment.  HPI:  The patient was last seen in the hematology clinic on 05/07/2017.  At that time,  she remained fatigued. She was gaining fluid weight attributed to her heart failure. She had short of breath with exertion. She was sleeping in a recliner. Exam revealed lower extremity edema.  Hemoglobin was 10.7.  RBCs were still microcytic possibly due to alpha thalassemia.  Ferritin was 85.  She did not receive IV iron.  She received week #4 of 6 B12.  Labs on 06/25/2017 revealed a ferritin of 14 and a folate of 8.1.  She continued weekly B12 (last 05/28/2017) then began monthly injections (last 07/23/2017). She received Venofer on (06/13, 06/18, 06/26).  CBC on 08/19/2017 revealed a hematocrit of 39.8, hemoglobin 12.5, and MCV 78.6.  Ferritin was 79.  During the interim, she has noted similar symptoms to her last visit.  Her biggest problem is shortness of breath associated with her CHF.  She continues to sleep in a recliner.  She has an appointment with cardiology in 08/2017.  She notes that her menses has "lightened up".  She notes that her 2 month period is over (after discontinuation of BCPs).   Past Medical History:  Diagnosis Date  . Anxiety   . CHF (congestive heart failure) ([place])   . Chronic heart failure ([place])   . Morbid obesity ([place])   . OSA (obstructive sleep apnea)    uses CPAP at home  . Oxygen deficiency 02/28/2017   3 liters  . Peripheral edema   . Scoliosis     Past Surgical History:  Procedure Laterality Date  . HERNIA REPAIR    . scoliosis repair    . TUBAL LIGATION      Family History  Problem Relation Age of Onset  . Diabetes Mother   . Hypertension Mother   . Cancer Maternal Grandmother     Social History:  reports that she quit smoking about 6 months ago.  Her smoking use included cigarettes. She smoked 0.20 packs per day. She has never used smokeless tobacco. She reports that she drinks about 0.6 - 1.2 oz of alcohol per week. She reports that she does not use drugs.  She quit smoking 1 month ago.  She smoked < 1 pack/day since age 36.  She denies any exposure to radiations or toxins.  She lives in [place].  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  The patient is alone today.  Allergies: No Known Allergies  Current Medications: Current Outpatient Medications  Medication Sig Dispense Refill  . albuterol (PROVENTIL HFA;VENTOLIN HFA) 108 (90 Base) MCG/ACT inhaler Inhale 2 puffs into the lungs every 4 (four) hours as needed for wheezing or shortness of breath. 1 Inhaler 1  . ARIPiprazole (ABILIFY) 2 MG tablet Take 10 mg by mouth daily.     . budesonide-formoterol (SYMBICORT) 160-4.5 MCG/ACT inhaler Inhale 2 puffs into the lungs 2 (two) times daily.    . bumetanide (BUMEX) 2 MG tablet Take 2 mg by mouth 2 (two) times daily.     Marland Kitchen buPROPion (WELLBUTRIN) 75 MG tablet Take 150 mg by mouth daily.     . diclofenac (VOLTAREN) 75 MG EC tablet Take 1 tablet (75 mg total) by mouth 2 (two) times daily  after a meal. 60 tablet 1  . diclofenac sodium (VOLTAREN) 1 % GEL Apply 4 (four) times daily topically.    Marland Kitchen diltiazem (CARDIZEM) 120 MG tablet Take 120 mg daily by mouth.    . escitalopram (LEXAPRO) 20 MG tablet Take 20 mg by mouth daily.     . ferrous sulfate 325 (65 FE) MG tablet Take 325 mg by mouth daily with breakfast.    . lidocaine (LIDODERM) 5 % Place 1 patch daily onto the skin. Remove & Discard patch within 12 hours or as directed by MD    . losartan (COZAAR) 25 MG tablet Take 1 tablet (25 mg total) by mouth daily. 90 tablet 3  . meloxicam (MOBIC) 15 MG tablet Take 15 mg by mouth daily as needed for pain.    . montelukast (SINGULAIR) 10 MG tablet Take 10 mg by mouth at bedtime.    . norgestimate-ethinyl estradiol (SPRINTEC  28) 0.25-35 MG-MCG tablet Take 1 tablet by mouth daily.    . potassium chloride SA (K-DUR,KLOR-CON) 20 MEQ tablet Take 40 mEq by mouth 2 (two) times daily.    . traMADol (ULTRAM) 50 MG tablet Take 1 tablet (50 mg total) by mouth every 8 (eight) hours as needed for severe pain. 90 tablet 2  . Vitamin D, Ergocalciferol, (DRISDOL) 50000 units CAPS capsule Take 50,000 Units by mouth every 7 (seven) days.    . metoprolol succinate (TOPROL-XL) 50 MG 24 hr tablet Take 1 tablet (50 mg total) by mouth daily. Take with or immediately following a meal. 90 tablet 3   No current facility-administered medications for this visit.    Facility-Administered Medications Ordered in Other Visits  Medication Dose Route Frequency Provider Last Rate Last Dose  . technetium tetrofosmin (TC-MYOVIEW) injection 33.29 millicurie  51.88 millicurie Intravenous Once PRN [place], David A, MD        Review of Systems:  GENERAL:  Fatigue.  No fevers, sweats.  Weight up 5 pounds. PERFORMANCE STATUS (ECOG):  1 HEENT:  No visual changes, runny nose, sore throat, mouth sores or tenderness. Lungs: Shortness of breath with exertion.  No cough.  No hemoptysis. Cardiac:  CHF.  Orthopnea (sleep in a recliner).  No chest pain, palpitations, or PND. GI:  No nausea, vomiting, diarrhea, constipation, melena or hematochezia. GU:  No urgency, frequency, dysuria, or hematuria.  Menses improved. Musculoskeletal:  Back pain.  No joint pain.  No muscle tenderness. Extremities:  Pain, swelling, stiffness in hands. Skin:  No rashes or skin changes. Neuro:  Numbness and tingling with prolonged sitting.  No headache, weakness, balance or coordination issues. Endocrine:  No diabetes, thyroid issues, hot flashes or night sweats. Psych:  No mood changes, depression or anxiety. Pain:  8 out of 10 back pain. Review of systems:  All other systems reviewed and found to be negative.   Physical Exam: .BP (!) 144/72 (BP Location: Left Arm, Patient  Position: Sitting)   Pulse 83   Temp 98.3 F (36.8 C) (Tympanic)   Resp 20   Wt (!) 368 lb 2.7 oz (167 kg)   LMP 08/01/2017   SpO2 97% Comment: 88% whem we got into exam room from lobby  BMI 69.56 kg/m  GENERAL:  Well developed, well nourished, heayset woman sitting comfortably in the exam room in no acute distress. MENTAL STATUS:  Alert and oriented to person, place and time. HEAD:  Dark hair.  Normocephalic, atraumatic, face symmetric, no Cushingoid features. EYES:  Brown eyes.  Pupils equal round and  reactive to light and accomodation.  No conjunctivitis or scleral icterus. ENT:  Oropharynx clear without lesion.  Tongue normal. Mucous membranes moist.  RESPIRATORY:  Clear to auscultation without rales, wheezes or rhonchi. CARDIOVASCULAR:  Regular rate and rhythm without murmur, rub or gallop. ABDOMEN:  Fully round.  Soft, non-tender, with active bowel sounds, and no appreciable hepatosplenomegaly.  No masses. SKIN:  No rashes, ulcers or lesions. EXTREMITIES: Large legs.  No skin discoloration or tenderness.  No palpable cords. LYMPH NODES: No palpable cervical, supraclavicular, axillary or inguinal adenopathy  NEUROLOGICAL: Unremarkable. PSYCH:  Appropriate.    Appointment on 08/19/2017  Component Date Value Ref Range Status  . Ferritin 08/19/2017 79  11 - 307 ng/mL Final   Performed at [facility], [address] [place], [place] [address]  . WBC 08/19/2017 7.9  3.6 - 11.0 K/uL Final  . RBC 08/19/2017 5.07  3.80 - 5.20 MIL/uL Final  . Hemoglobin 08/19/2017 12.5  12.0 - 16.0 g/dL Final  . HCT 08/19/2017 39.8  35.0 - 47.0 % Final  . MCV 08/19/2017 78.6* 80.0 - 100.0 fL Final  . MCH 08/19/2017 24.6* 26.0 - 34.0 pg Final  . MCHC 08/19/2017 31.3* 32.0 - 36.0 g/dL Final  . RDW 08/19/2017 17.8* 11.5 - 14.5 % Final  . Platelets 08/19/2017 214  150 - 440 K/uL Final  . Neutrophils Relative % 08/19/2017 68  % Final  . Neutro Abs 08/19/2017 5.4  1.4 - 6.5 K/uL Final  .  Lymphocytes Relative 08/19/2017 22  % Final  . Lymphs Abs 08/19/2017 1.8  1.0 - 3.6 K/uL Final  . Monocytes Relative 08/19/2017 8  % Final  . Monocytes Absolute 08/19/2017 0.6  0.2 - 0.9 K/uL Final  . Eosinophils Relative 08/19/2017 2  % Final  . Eosinophils Absolute 08/19/2017 0.2  0 - 0.7 K/uL Final  . Basophils Relative 08/19/2017 0  % Final  . Basophils Absolute 08/19/2017 0.0  0 - 0.1 K/uL Final   Performed at [facility], [address] [place], [place] [address]    Assessment:  CARRERA KIESEL is a 37 y.o. female with iron deficiency anemia felt secondary to heavy menses.  She has B12 deficiency.  She developed microcytic indices in 05/2014 and became mildly anemic in 11/2014. Diet is good.  She has ice pica.  She denies any melena, hematochezia or hematuria.  She is on oral iron.  Labs revealed a + ANA (1:1280 centromere pattern; 2.3 AI centromere AB screen) on 03/03/2017.  Urinalysis on 02/27/2017 revealed no RBCs.  Ferritin was 11 on 02/20/2016.  Iron studies on 01/09/2017 revealed 8% saturation and TIBC 390.  Normal studies included: TSH, free T4, folate, and B12 on 02/20/2016.   Hemoglobin electrophoresis on 02/21/2016 revealed 98.3% HgA and 1.7% Hgb A2.  The low Hgb A2 may indicate an alpha thalassemia or iron deficiency.   Work-up on 03/19/2017 revealed a hematocrit of 36.6, hemoglobin 11.5, and MCV 70.8.  Ferritin was 15 with an iron saturation of 19% and a TIBC of 399.  B12 was 210 (low).  Folate and TSH were normal.  She has B12 deficiency.  She began B12 on 04/16/2017 (last 07/23/2017).  She received Venofer x 3 (04/16/2017 - 04/30/2017) and x 3 (07/10/2017 - 07/23/2017).    Ferritin has been followed: 8 on 12/25/2016, 15 on 03/19/2017, 11 on 04/15/2017, 85 on 05/07/2017, 14 on 06/25/2017, and 79 on 08/19/2017.  Symptomatically, she continues to be fatigued secondary to CHF.  Menses is now light.  Exam is stable.  Hemoglobin is normal.  Ferritin is  79.  Plan: 1.  Review labs from 08/19/2017.  Hemoglobin normal.  MCV low, but improving.  Iron stores adequate. 2.  No Venofer today.  Discuss continuation of oral iron as tolerating well.  Ferritin goal is 100. 3.  Continue monthly B12 injections x 5 (due today). 4.  RTC in 3 months for labs (CBC with, ferritin, ESR) and B12 injection.  5.  RTC in 6 months for MD assessment, labs (CBC with differential, ferritin -day before), B12 and +/- Venofer.    Nolon Stalls, MD 08/20/2017,1:57 PM

## 2017-08-20 NOTE — Patient Instructions (Signed)
Cyanocobalamin, Vitamin B12 injection What is this medicine? CYANOCOBALAMIN (sye an oh koe BAL a min) is a man made form of vitamin B12. Vitamin B12 is used in the growth of healthy blood cells, nerve cells, and proteins in the body. It also helps with the metabolism of fats and carbohydrates. This medicine is used to treat people who can not absorb vitamin B12. This medicine may be used for other purposes; ask your health care provider or pharmacist if you have questions. COMMON BRAND NAME(S): B-12 Compliance Kit, B-12 Injection Kit, Cyomin, LA-12, Nutri-Twelve, Physicians EZ Use B-12, Primabalt What should I tell my health care provider before I take this medicine? They need to know if you have any of these conditions: -kidney disease -Leber's disease -megaloblastic anemia -an unusual or allergic reaction to cyanocobalamin, cobalt, other medicines, foods, dyes, or preservatives -pregnant or trying to get pregnant -breast-feeding How should I use this medicine? This medicine is injected into a muscle or deeply under the skin. It is usually given by a health care professional in a clinic or doctor's office. However, your doctor may teach you how to inject yourself. Follow all instructions. Talk to your pediatrician regarding the use of this medicine in children. Special care may be needed. Overdosage: If you think you have taken too much of this medicine contact a poison control center or emergency room at once. NOTE: This medicine is only for you. Do not share this medicine with others. What if I miss a dose? If you are given your dose at a clinic or doctor's office, call to reschedule your appointment. If you give your own injections and you miss a dose, take it as soon as you can. If it is almost time for your next dose, take only that dose. Do not take double or extra doses. What may interact with this medicine? -colchicine -heavy alcohol intake This list may not describe all possible  interactions. Give your health care provider a list of all the medicines, herbs, non-prescription drugs, or dietary supplements you use. Also tell them if you smoke, drink alcohol, or use illegal drugs. Some items may interact with your medicine. What should I watch for while using this medicine? Visit your doctor or health care professional regularly. You may need blood work done while you are taking this medicine. You may need to follow a special diet. Talk to your doctor. Limit your alcohol intake and avoid smoking to get the best benefit. What side effects may I notice from receiving this medicine? Side effects that you should report to your doctor or health care professional as soon as possible: -allergic reactions like skin rash, itching or hives, swelling of the face, lips, or tongue -blue tint to skin -chest tightness, pain -difficulty breathing, wheezing -dizziness -red, swollen painful area on the leg Side effects that usually do not require medical attention (report to your doctor or health care professional if they continue or are bothersome): -diarrhea -headache This list may not describe all possible side effects. Call your doctor for medical advice about side effects. You may report side effects to FDA at 1-800-FDA-1088. Where should I keep my medicine? Keep out of the reach of children. Store at room temperature between 15 and 30 degrees C (59 and 85 degrees F). Protect from light. Throw away any unused medicine after the expiration date. NOTE: This sheet is a summary. It may not cover all possible information. If you have questions about this medicine, talk to your doctor, pharmacist, or   health care provider.  2018 Elsevier/Gold Standard (2007-04-27 22:10:20)  

## 2017-09-02 ENCOUNTER — Other Ambulatory Visit: Payer: Self-pay

## 2017-09-02 ENCOUNTER — Ambulatory Visit: Payer: Medicaid Other | Attending: Nurse Practitioner | Admitting: Nurse Practitioner

## 2017-09-02 ENCOUNTER — Encounter: Payer: Self-pay | Admitting: Nurse Practitioner

## 2017-09-02 VITALS — BP 114/78 | HR 96 | Temp 97.8°F | Resp 18 | Ht 61.0 in | Wt 368.0 lb

## 2017-09-02 DIAGNOSIS — I110 Hypertensive heart disease with heart failure: Secondary | ICD-10-CM | POA: Insufficient documentation

## 2017-09-02 DIAGNOSIS — Z8249 Family history of ischemic heart disease and other diseases of the circulatory system: Secondary | ICD-10-CM | POA: Diagnosis not present

## 2017-09-02 DIAGNOSIS — J45909 Unspecified asthma, uncomplicated: Secondary | ICD-10-CM | POA: Diagnosis not present

## 2017-09-02 DIAGNOSIS — E662 Morbid (severe) obesity with alveolar hypoventilation: Secondary | ICD-10-CM | POA: Insufficient documentation

## 2017-09-02 DIAGNOSIS — M25562 Pain in left knee: Secondary | ICD-10-CM | POA: Insufficient documentation

## 2017-09-02 DIAGNOSIS — F419 Anxiety disorder, unspecified: Secondary | ICD-10-CM | POA: Diagnosis not present

## 2017-09-02 DIAGNOSIS — Z7951 Long term (current) use of inhaled steroids: Secondary | ICD-10-CM | POA: Diagnosis not present

## 2017-09-02 DIAGNOSIS — I5032 Chronic diastolic (congestive) heart failure: Secondary | ICD-10-CM | POA: Diagnosis not present

## 2017-09-02 DIAGNOSIS — Z833 Family history of diabetes mellitus: Secondary | ICD-10-CM | POA: Insufficient documentation

## 2017-09-02 DIAGNOSIS — D509 Iron deficiency anemia, unspecified: Secondary | ICD-10-CM | POA: Insufficient documentation

## 2017-09-02 DIAGNOSIS — G894 Chronic pain syndrome: Secondary | ICD-10-CM

## 2017-09-02 DIAGNOSIS — Z6841 Body Mass Index (BMI) 40.0 and over, adult: Secondary | ICD-10-CM | POA: Diagnosis not present

## 2017-09-02 DIAGNOSIS — F329 Major depressive disorder, single episode, unspecified: Secondary | ICD-10-CM | POA: Insufficient documentation

## 2017-09-02 DIAGNOSIS — Z79891 Long term (current) use of opiate analgesic: Secondary | ICD-10-CM | POA: Diagnosis not present

## 2017-09-02 DIAGNOSIS — Z79899 Other long term (current) drug therapy: Secondary | ICD-10-CM | POA: Insufficient documentation

## 2017-09-02 DIAGNOSIS — R7982 Elevated C-reactive protein (CRP): Secondary | ICD-10-CM | POA: Diagnosis not present

## 2017-09-02 DIAGNOSIS — M25561 Pain in right knee: Secondary | ICD-10-CM | POA: Diagnosis present

## 2017-09-02 DIAGNOSIS — M546 Pain in thoracic spine: Secondary | ICD-10-CM | POA: Diagnosis not present

## 2017-09-02 DIAGNOSIS — E538 Deficiency of other specified B group vitamins: Secondary | ICD-10-CM | POA: Insufficient documentation

## 2017-09-02 DIAGNOSIS — R700 Elevated erythrocyte sedimentation rate: Secondary | ICD-10-CM | POA: Diagnosis not present

## 2017-09-02 DIAGNOSIS — B20 Human immunodeficiency virus [HIV] disease: Secondary | ICD-10-CM | POA: Insufficient documentation

## 2017-09-02 DIAGNOSIS — R000 Tachycardia, unspecified: Secondary | ICD-10-CM | POA: Diagnosis not present

## 2017-09-02 DIAGNOSIS — M41115 Juvenile idiopathic scoliosis, thoracolumbar region: Secondary | ICD-10-CM | POA: Insufficient documentation

## 2017-09-02 DIAGNOSIS — Z87891 Personal history of nicotine dependence: Secondary | ICD-10-CM | POA: Diagnosis not present

## 2017-09-02 DIAGNOSIS — I890 Lymphedema, not elsewhere classified: Secondary | ICD-10-CM | POA: Diagnosis not present

## 2017-09-02 DIAGNOSIS — E559 Vitamin D deficiency, unspecified: Secondary | ICD-10-CM | POA: Diagnosis not present

## 2017-09-02 DIAGNOSIS — M545 Low back pain: Secondary | ICD-10-CM | POA: Insufficient documentation

## 2017-09-02 DIAGNOSIS — G8929 Other chronic pain: Secondary | ICD-10-CM

## 2017-09-02 MED ORDER — TRAMADOL HCL 50 MG PO TABS: 50.0000 mg | ORAL_TABLET | Freq: Three times a day (TID) | ORAL | 2 refills | Status: DC | PRN

## 2017-09-02 NOTE — Patient Instructions (Addendum)
__________________________________________________________________________________________  Medication Rules  Applies to: All patients receiving prescriptions (written or electronic).  Pharmacy of record: Pharmacy where electronic prescriptions will be sent. If written prescriptions are taken to a different pharmacy, please inform the nursing staff. The pharmacy listed in the electronic medical record should be the one where you would like electronic prescriptions to be sent.  Prescription refills: Only during scheduled appointments. Applies to both, written and electronic prescriptions.  NOTE: The following applies primarily to controlled substances (Opioid* Pain Medications).   Patient's responsibilities: 1. Pain Pills: Bring all pain pills to every appointment (except for procedure appointments). 2. Pill Bottles: Bring pills in original pharmacy bottle. Always bring newest bottle. Bring bottle, even if empty. 3. Medication refills: You are responsible for knowing and keeping track of what medications you need refilled. The day before your appointment, write a list of all prescriptions that need to be refilled. Bring that list to your appointment and give it to the admitting nurse. Prescriptions will be written only during appointments. If you forget a medication, it will not be "Called in", "Faxed", or "electronically sent". You will need to get another appointment to get these prescribed. 4. Prescription Accuracy: You are responsible for carefully inspecting your prescriptions before leaving our office. Have the discharge nurse carefully go over each prescription with you, before taking them home. Make sure that your name is accurately spelled, that your address is correct. Check the name and dose of your medication to make sure it is accurate. Check the number of pills, and the written instructions to make sure they are clear and accurate. Make sure that you are given enough medication to last  until your next medication refill appointment. 5. Taking Medication: Take medication as prescribed. Never take more pills than instructed. Never take medication more frequently than prescribed. Taking less pills or less frequently is permitted and encouraged, when it comes to controlled substances (written prescriptions).  6. Inform other Doctors: Always inform, all of your healthcare providers, of all the medications you take. 7. Pain Medication from other Providers: You are not allowed to accept any additional pain medication from any other Doctor or Healthcare provider. There are two exceptions to this rule. (see below) In the event that you require additional pain medication, you are responsible for notifying us, as stated below. 8. Medication Agreement: You are responsible for carefully reading and following our Medication Agreement. This must be signed before receiving any prescriptions from our practice. Safely store a copy of your signed Agreement. Violations to the Agreement will result in no further prescriptions. (Additional copies of our Medication Agreement are available upon request.) 9. Laws, Rules, & Regulations: All patients are expected to follow all [address]Federal and Walt DisneyState Laws, [employer] IndustriesStatutes, Rules, [employer] [employer]& Regulations. Ignorance of the Laws does not constitute a valid excuse. The use of any illegal substances is prohibited. 10. Adopted CDC guidelines & recommendations: Target dosing levels will be at or below 60 MME/day. Use of benzodiazepines** is not recommended.  Exceptions: There are only two exceptions to the rule of not receiving pain medications from other Healthcare Providers. 1. Exception #1 (Emergencies): In the event of an emergency (i.e.: accident requiring emergency care), you are allowed to receive additional pain medication. However, you are responsible for: As soon as you are able, call our office [phone], at any time of the day or night, and leave a message stating your name, the  date and nature of the emergency, and the name and dose of the medication  prescribed. In the event that your call is answered by a member of our staff, make sure to document and save the date, time, and the name of the person that took your information.  2. Exception #2 (Planned Surgery): In the event that you are scheduled by another doctor or dentist to have any type of surgery or procedure, you are allowed (for a period no longer than 30 days), to receive additional pain medication, for the acute post-op pain. However, in this case, you are responsible for picking up a copy of our "Post-op Pain Management for Surgeons" handout, and giving it to your surgeon or dentist. This document is available at our office, and does not require an appointment to obtain it. Simply go to our office during business hours (Monday-Thursday from 8:00 AM to 4:00 PM) (Friday 8:00 AM to 12:00 Noon) or if you have a scheduled appointment with [place], prior to your surgery, and ask for it by name. In addition, you will need to provide [place] with your name, name of your surgeon, type of surgery, and date of procedure or surgery.  *Opioid medications include: morphine, codeine, oxycodone, oxymorphone, hydrocodone, hydromorphone, meperidine, tramadol, tapentadol, buprenorphine, fentanyl, methadone. **Benzodiazepine medications include: diazepam (Valium), alprazolam (Xanax), clonazepam (Klonopine), lorazepam (Ativan), clorazepate (Tranxene), chlordiazepoxide (Librium), estazolam (Prosom), oxazepam (Serax), temazepam (Restoril), triazolam (Halcion) (Last updated: 03/27/2017) ____________________________________________________________________________________________   BMI Assessment: Estimated body mass index is 69.53 kg/m as calculated from the following:   Height as of this encounter: 5\' 1"  (1.549 m).   Weight as of this encounter: 368 lb (166.9 kg).  BMI interpretation table: BMI level Category Range association with higher  incidence of chronic pain  <18 kg/m2 Underweight   18.5-24.9 kg/m2 Ideal body weight   25-29.9 kg/m2 Overweight Increased incidence by 20%  30-34.9 kg/m2 Obese (Class I) Increased incidence by 68%  35-39.9 kg/m2 Severe obesity (Class II) Increased incidence by 136%  >40 kg/m2 Extreme obesity (Class III) Increased incidence by 254%   Patient's current BMI Ideal Body weight  Body mass index is 69.53 kg/m. Ideal body weight: 47.8 kg (105 lb 6.1 oz) Adjusted ideal body weight: 95.5 kg (210 lb 6.9 oz)   BMI Readings from Last 4 Encounters:  09/02/17 69.53 kg/m  08/20/17 69.56 kg/m  08/07/17 69.16 kg/m  07/09/17 68.21 kg/m   Wt Readings from Last 4 Encounters:  09/02/17 (!) 368 lb (166.9 kg)  08/20/17 (!) 368 lb 2.7 oz (167 kg)  08/07/17 (!) 366 lb (166 kg)  07/09/17 (!) 361 lb (163.7 kg)

## 2017-09-02 NOTE — Progress Notes (Signed)
Nursing Pain Medication Assessment:  Safety precautions to be maintained throughout the outpatient stay will include: orient to surroundings, keep bed in low position, maintain call bell within reach at all times, provide assistance with transfer out of bed and ambulation.  Medication Inspection Compliance: [name] did not comply with our request to bring her pills to be counted. She was reminded that bringing the medication bottles, even when empty, is a requirement.  Medication: None brought in. Pill/Patch Count: None available to be counted. Bottle Appearance: No container available. Did not bring bottle(s) to appointment. Filled Date: N/A Last Medication intake:  Ran out of medicine more than 48 hours ago

## 2017-09-02 NOTE — Progress Notes (Signed)
Patient's Name: Tracey White  MRN: [id number]  Referring Provider: Zeb Comfort, MD  DOB: 05-21-1980  PCP: Zeb Comfort, MD  DOS: 09/02/2017  Note by: Vevelyn Francois NP  Service setting: Ambulatory outpatient  Specialty: Interventional Pain Management  Location: [facility] (AMB) Pain Management Facility    Patient type: Established    Primary Reason(s) for Visit: Encounter for prescription drug management. (Level of risk: moderate)  CC: Knee Pain (bilaterally, right is worse) and Back Pain (lower, right side)  HPI  Tracey White is a 37 y.o. year old, female patient, who comes today for a medication management evaluation. She has Chest pain at rest; Chronic diastolic heart failure ([place]); Sinus tachycardia; Obstructive sleep apnea on CPAP; Tobacco use; Chest pain; Hypokalemia; Depression; Chronic low back pain (Primary Area of Pain) (Bilateral) (L>R); Skin lesion of left leg; Lymphedema; Chronic knee pain (Secondary Area of Pain) (Bilateral) (R>L); Chronic thoracic back pain (Fourth Area of Pain) (Bilateral) (R>L); Chronic pain syndrome; Disorder of skeletal system; Other long term (current) drug therapy; Scoliosis; Asthma without status asthmaticus; Anemia; Acute diastolic heart failure ([place]); Neurogenic pain; Failed back surgical syndrome; Pharmacologic therapy; Problems influencing health status; Chronic lower extremity pain (Tertiary Area of Pain) (Bilateral) (R>L); Elevated C-reactive protein (CRP); Elevated sed rate; Vitamin D deficiency; Class 3 obesity with alveolar hypoventilation, serious comorbidity, and body mass index (BMI) of 60.0 to 69.9 in adult [facility]); ANA positive; Positive antinuclear antibody; Iron deficiency anemia; B12 deficiency; Goals of care, counseling/discussion; AIDS ([place]); Long term current use of opiate analgesic; Hypertensive heart disease; and Chronic diastolic congestive heart failure (HCC) on their problem list. Her primarily concern today is the Knee Pain (bilaterally,  right is worse) and Back Pain (lower, right side)  Pain Assessment: Location: Right, Left(right is worse) Knee Onset: More than a month ago Duration: Chronic pain Quality: Constant Severity: 8 /10 (subjective, self-reported pain score)  Note: Reported level is compatible with observation. Clinically the patient looks like a 2/10 A 2/10 is viewed as "Mild to Moderate" and described as noticeable and distracting. Impossible to hide from other people. More frequent flare-ups. Still possible to adapt and function close to normal. It can be very annoying and may have occasional stronger flare-ups. With discipline, patients may get used to it and adapt.       When using our objective Pain Scale, levels between 6 and 10/10 are said to belong in an emergency room, as it progressively worsens from a 6/10, described as severely limiting, requiring emergency care not usually available at an outpatient pain management facility. At a 6/10 level, communication becomes difficult and requires great effort. Assistance to reach the emergency department may be required. Facial flushing and profuse sweating along with potentially dangerous increases in heart rate and blood pressure will be evident. Effect on ADL: walking is limited Timing: Constant Modifying factors: meds, ice, heat, rest, topicals BP: 114/78  HR: 96  Tracey White was last scheduled for an appointment on 06/02/2017 for medication management. During today's appointment we reviewed Tracey White's chronic pain status, as well as her outpatient medication regimen. She is SP bilateral Hyalgan injections x3.  She admits that they were not effective for her chronic knee pain.  She admits that she is now having increased right-sided back pain secondary to her knee pain.  She is status post spinal surgery secondary to scoliosis. She admits that in November she is going to follow-up with bariatric surgeons.  Is currently not doing a lot for  weight loss.  She admits that  her main meal is late at night. Denies any recent hospitalizations.   The patient  reports that she does not use drugs. Her body mass index is 69.53 kg/m.  Further details on both, my assessment(s), as well as the proposed treatment plan, please see below.  Controlled Substance Pharmacotherapy Assessment REMS (Risk Evaluation and Mitigation Strategy)  Analgesic:Tramadol50 mg TID (Tramadol 150 mg per day) MME/day:73m/day. WRise Patience RN  09/02/2017  9:08 AM  Signed Nursing Pain Medication Assessment:  Safety precautions to be maintained throughout the outpatient stay will include: orient to surroundings, keep bed in low position, maintain call bell within reach at all times, provide assistance with transfer out of bed and ambulation.  Medication Inspection Compliance: Ms. MAllbaughdid not comply with our request to bring her pills to be counted. She was reminded that bringing the medication bottles, even when empty, is a requirement.  Medication: None brought in. Pill/Patch Count: None available to be counted. Bottle Appearance: No container available. Did not bring bottle(s) to appointment. Filled Date: N/A Last Medication intake:  Ran out of medicine more than 48 hours ago   Pharmacokinetics: Liberation and absorption (onset of action): WNL Distribution (time to peak effect): WNL Metabolism and excretion (duration of action): WNL         Pharmacodynamics: Desired effects: Analgesia: Ms. MDurrettreports >50% benefit. Functional ability: Patient reports that medication allows her to accomplish basic ADLs Clinically meaningful improvement in function (CMIF): Sustained CMIF goals met Perceived effectiveness: Described as relatively effective, allowing for increase in activities of daily living (ADL) Undesirable effects: Side-effects or Adverse reactions: None reported Monitoring: [place] PMP: Online review of the past 122-montheriod conducted. Compliant with practice rules and  regulations Last UDS on record: Summary  Date Value Ref Range Status  06/02/2017 FINAL  Final    Comment:    ==================================================================== TOXASSURE SELECT 13 (MW) ==================================================================== Test                             Result       Flag       Units Drug Present and Declared for Prescription Verification   Tramadol                       9744         EXPECTED   ng/mg creat   O-Desmethyltramadol            2873         EXPECTED   ng/mg creat   N-Desmethyltramadol            1468         EXPECTED   ng/mg creat    Source of tramadol is a prescription medication.    O-desmethyltramadol and N-desmethyltramadol are expected    metabolites of tramadol. ==================================================================== Test                      Result    Flag   Units      Ref Range   Creatinine              41               mg/dL      >=20 ==================================================================== Declared Medications:  The flagging and interpretation on this report are based on the  following declared medications.  Unexpected results may arise  from  inaccuracies in the declared medications.  **Note: The testing scope of this panel includes these medications:  Tramadol  **Note: The testing scope of this panel does not include following  reported medications:  Albuterol  Aripiprazole  Budenoside (Symbicort)  Bumetanide (Bumex)  Bupropion (Wellbutrin)  Diclofenac (Voltaren)  Diltiazem (Cardizem)  Escitalopram (Lexapro)  Estradiol (Ortho Tri-Cyclen)  Formoterol (Symbicort)  Iron (Ferrous Sulfate)  Lidocaine  Losartan  Meloxicam (Mobic)  Metoprolol (Toprol)  Montelukast (Singulair)  Norgestimate (Ortho Tri-Cyclen)  Potassium (K-Dur)  Vitamin D2 (Drisdol) ==================================================================== For clinical consultation, please call [phone]. ====================================================================    UDS interpretation: Compliant          Medication Assessment Form: Reviewed. Patient indicates being compliant with therapy Treatment compliance: Compliant Risk Assessment Profile: Aberrant behavior: See prior evaluations. None observed or detected today Comorbid factors increasing risk of overdose: See prior notes. No additional risks detected today Risk of substance use disorder (SUD): Low Opioid Risk Tool - 09/02/17 0908      Family History of Substance Abuse   Alcohol  Positive Female    Illegal Drugs  Negative    Rx Drugs  Negative      Personal History of Substance Abuse   Alcohol  Positive Female or Female    Illegal Drugs  Negative    Rx Drugs  Negative      Age   Age between 26-45 years   Yes      History of Preadolescent Sexual Abuse   History of Preadolescent Sexual Abuse  Negative or Female      Psychological Disease   Psychological Disease  Negative    Depression  Positive      Total Score   Opioid Risk Tool Scoring  6    Opioid Risk Interpretation  Moderate Risk      ORT Scoring interpretation table:  Score <3 = Low Risk for SUD  Score between 4-7 = Moderate Risk for SUD  Score >8 = High Risk for Opioid Abuse   Risk Mitigation Strategies:  Patient Counseling: Covered Patient-Prescriber Agreement (PPA): Present and active  Notification to other healthcare providers: Done  Pharmacologic Plan: No change in therapy, at this time.             Laboratory Chemistry  Inflammation Markers (CRP: Acute Phase) (ESR: Chronic Phase) Lab Results  Component Value Date   CRP 58.7 (H) 12/12/2016   ESRSEDRATE 30 (H) 06/25/2017                         Rheumatology Markers Lab Results  Component Value Date   RF <10.0 01/08/2017   ANA Positive (A) 01/08/2017                        Renal Function Markers Lab Results  Component Value Date   BUN 10 03/24/2017   CREATININE 0.74  03/24/2017   BCR 15 12/12/2016   GFRAA >60 03/24/2017   GFRNONAA >60 03/24/2017                             Hepatic Function Markers Lab Results  Component Value Date   AST 15 03/24/2017   ALT 11 (L) 03/24/2017   ALBUMIN 3.5 03/24/2017   ALKPHOS 110 03/24/2017  Electrolytes Lab Results  Component Value Date   NA 134 (L) 03/24/2017   K 3.3 (L) 03/24/2017   CL 99 (L) 03/24/2017   CALCIUM 8.7 (L) 03/24/2017   MG 2.1 12/12/2016                        Neuropathy Markers Lab Results  Component Value Date   VITAMINB12 210 03/19/2017   FOLATE 8.1 06/25/2017   HGBA1C 6.1 (H) 05/31/2016   HIV Non Reactive 12/25/2016                        Bone Pathology Markers Lab Results  Component Value Date   25OHVITD1 9.1 (L) 12/12/2016   25OHVITD2 3.6 12/12/2016   25OHVITD3 5.5 12/12/2016                         Coagulation Parameters Lab Results  Component Value Date   PLT 214 08/19/2017   DDIMER <0.27 12/24/2016                        Cardiovascular Markers Lab Results  Component Value Date   BNP 17.0 03/24/2017   TROPONINI <0.03 03/24/2017   HGB 12.5 08/19/2017   HCT 39.8 08/19/2017                         CA Markers No results found for: CEA, CA125, LABCA2                      Note: Lab results reviewed.  Recent Diagnostic Imaging Results  DG Chest 2 View CLINICAL DATA:  Shortness of breath and cough  EXAM: CHEST  2 VIEW  COMPARISON:  December 30, 2016  FINDINGS: There is no edema or consolidation. Heart size and pulmonary vascularity are normal. No adenopathy. Patient is status post fixation rod placement for lower thoracic dextroscoliosis.  IMPRESSION: No edema or consolidation.  Stable cardiac silhouette.  Electronically Signed   By: Lowella Grip III M.D.   On: 03/24/2017 13:14  Complexity Note: Imaging results reviewed. Results shared with Ms. Caligiuri, using Layman's terms.                         Meds   Current  Outpatient Medications:  .  albuterol (PROVENTIL HFA;VENTOLIN HFA) 108 (90 Base) MCG/ACT inhaler, Inhale 2 puffs into the lungs every 4 (four) hours as needed for wheezing or shortness of breath., Disp: 1 Inhaler, Rfl: 1 .  ARIPiprazole (ABILIFY) 2 MG tablet, Take 10 mg by mouth daily. , Disp: , Rfl:  .  budesonide-formoterol (SYMBICORT) 160-4.5 MCG/ACT inhaler, Inhale 2 puffs into the lungs 2 (two) times daily., Disp: , Rfl:  .  bumetanide (BUMEX) 2 MG tablet, Take 2 mg by mouth 2 (two) times daily. , Disp: , Rfl:  .  buPROPion (WELLBUTRIN) 75 MG tablet, Take 150 mg by mouth daily. , Disp: , Rfl:  .  diclofenac (VOLTAREN) 75 MG EC tablet, Take 1 tablet (75 mg total) by mouth 2 (two) times daily after a meal., Disp: 60 tablet, Rfl: 1 .  diclofenac sodium (VOLTAREN) 1 % GEL, Apply 4 (four) times daily topically., Disp: , Rfl:  .  diltiazem (CARDIZEM) 120 MG tablet, Take 120 mg daily by mouth., Disp: , Rfl:  .  escitalopram (LEXAPRO) 20 MG tablet,  Take 20 mg by mouth daily. , Disp: , Rfl:  .  ferrous sulfate 325 (65 FE) MG tablet, Take 325 mg by mouth daily with breakfast., Disp: , Rfl:  .  lidocaine (LIDODERM) 5 %, Place 1 patch daily onto the skin. Remove & Discard patch within 12 hours or as directed by MD, Disp: , Rfl:  .  losartan (COZAAR) 25 MG tablet, Take 1 tablet (25 mg total) by mouth daily., Disp: 90 tablet, Rfl: 3 .  meloxicam (MOBIC) 15 MG tablet, Take 15 mg by mouth daily as needed for pain., Disp: , Rfl:  .  montelukast (SINGULAIR) 10 MG tablet, Take 10 mg by mouth at bedtime., Disp: , Rfl:  .  norgestimate-ethinyl estradiol (SPRINTEC 28) 0.25-35 MG-MCG tablet, Take 1 tablet by mouth daily., Disp: , Rfl:  .  potassium chloride SA (K-DUR,KLOR-CON) 20 MEQ tablet, Take 40 mEq by mouth 2 (two) times daily., Disp: , Rfl:  .  [START ON 09/09/2017] traMADol (ULTRAM) 50 MG tablet, Take 1 tablet (50 mg total) by mouth every 8 (eight) hours as needed for severe pain., Disp: 90 tablet, Rfl: 2 .   Vitamin D, Ergocalciferol, (DRISDOL) 50000 units CAPS capsule, Take 50,000 Units by mouth every 7 (seven) days., Disp: , Rfl:  .  metoprolol succinate (TOPROL-XL) 50 MG 24 hr tablet, Take 1 tablet (50 mg total) by mouth daily. Take with or immediately following a meal., Disp: 90 tablet, Rfl: 3 No current facility-administered medications for this visit.   Facility-Administered Medications Ordered in Other Visits:  .  technetium tetrofosmin (TC-MYOVIEW) injection 30.94 millicurie, 07.68 millicurie, Intravenous, Once PRN, [place], David A, MD  ROS  Constitutional: Denies any fever or chills Gastrointestinal: No reported hemesis, hematochezia, vomiting, or acute GI distress Musculoskeletal: Denies any acute onset joint swelling, redness, loss of ROM, or weakness Neurological: No reported episodes of acute onset apraxia, aphasia, dysarthria, agnosia, amnesia, paralysis, loss of coordination, or loss of consciousness  Allergies  Ms. Guizar has No Known Allergies.  [place]  Drug: Ms. Mcquade  reports that she does not use drugs. Alcohol:  reports that she drinks about 0.6 - 1.2 oz of alcohol per week. Tobacco:  reports that she quit smoking about 6 months ago. Her smoking use included cigarettes. She smoked 0.20 packs per day. She has never used smokeless tobacco. Medical:  has a past medical history of Anxiety, CHF (congestive heart failure) ([place]), Chronic heart failure ([place]), Morbid obesity ([place]), OSA (obstructive sleep apnea), Oxygen deficiency (02/28/2017), Peripheral edema, and Scoliosis. Surgical: Ms. Predmore  has a past surgical history that includes Tubal ligation; Hernia repair; and scoliosis repair. Family: family history includes Cancer in her maternal grandmother; Diabetes in her mother; Hypertension in her mother.  Constitutional Exam  General appearance: Well nourished, well developed, and well hydrated. In no apparent acute distress Vitals:   09/02/17 0900  BP: 114/78  Pulse: 96  Resp:  18  Temp: 97.8 F (36.6 C)  TempSrc: Oral  SpO2: 99%  Weight: (!) 368 lb (166.9 kg)  Height: 5' 1"  (1.549 m)  Psych/Mental status: Alert, oriented x 3 (person, place, & time)       Eyes: PERLA Respiratory: No evidence of acute respiratory distress  Thoracic Spine Area Exam  Skin & Axial Inspection: No masses, redness, or swelling Alignment: Symmetrical Functional ROM: Unrestricted ROM Stability: No instability detected Muscle Tone/Strength: Functionally intact. No obvious neuro-muscular anomalies detected. Sensory (Neurological): Unimpaired Muscle strength & Tone: No palpable anomalies  Lumbar Spine Area Exam  Skin & Axial Inspection: No masses, redness, or swelling Alignment: Symmetrical Functional ROM: Unrestricted ROM       Stability: No instability detected Muscle Tone/Strength: Functionally intact. No obvious neuro-muscular anomalies detected. Sensory (Neurological): Unimpaired Palpation: No palpable anomalies       Provocative Tests: Hyperextension/rotation test: deferred today       Lumbar quadrant test (Kemp's test): deferred today       Lateral bending test: deferred today       Patrick's Maneuver: deferred today                   FABER test: deferred today                   S-I anterior distraction/compression test: deferred today         S-I lateral compression test: deferred today         S-I Thigh-thrust test: deferred today         S-I Gaenslen's test: deferred today           Gait & Posture Assessment  Ambulation: Unassisted Gait: Relatively normal for age and body habitus Posture: WNL   Lower Extremity Exam    Side: Right lower extremity  Side: Left lower extremity  Stability: No instability observed          Stability: No instability observed          Skin & Extremity Inspection: No gross anomalies   Skin & Extremity Inspection: Blistering to leg secondary to edema  Functional ROM: Decreased ROM                  Functional ROM: Unrestricted ROM                   Muscle Tone/Strength: Functionally intact. No obvious neuro-muscular anomalies detected.  Muscle Tone/Strength: Functionally intact. No obvious neuro-muscular anomalies detected.  Sensory (Neurological): Unimpaired  Sensory (Neurological): Unimpaired  Palpation: No palpable anomalies  Palpation: No palpable anomalies   Assessment  Primary Diagnosis & Pertinent Problem List: The primary encounter diagnosis was Chronic knee pain (Secondary Area of Pain) (Bilateral) (R>L). Diagnoses of Juvenile idiopathic scoliosis of thoracolumbar region, Class 3 obesity with alveolar hypoventilation, serious comorbidity, and body mass index (BMI) of 60.0 to 69.9 in adult [facility]), Chronic pain syndrome, and Long term prescription opiate use were also pertinent to this visit.  Status Diagnosis  Worsening Controlled Not improving 1. Chronic knee pain (Secondary Area of Pain) (Bilateral) (R>L)   2. Juvenile idiopathic scoliosis of thoracolumbar region   3. Class 3 obesity with alveolar hypoventilation, serious comorbidity, and body mass index (BMI) of 60.0 to 69.9 in adult ([place])   4. Chronic pain syndrome   5. Long term prescription opiate use     Problems updated and reviewed during this visit: Problem  Hypertensive Heart Disease  Chronic Diastolic Congestive Heart Failure (Hcc)   Last Assessment & Plan:  She has major heart strain secondary to morbid obesity and uncontrolled blood pressure.  I have increased her metoprolol to 50 mg a day and increased her losartan to 50 mg a day.  Follow-up with primary care for blood pressure optimization.  Unless she loses significant weight, I see progressive decline in her cardiac function just from increased strain.   Sinus Tachycardia   Plan of Care  Pharmacotherapy (Medications Ordered): Meds ordered this encounter  Medications  . traMADol (ULTRAM) 50 MG tablet    Sig: Take 1 tablet (50 mg  total) by mouth every 8 (eight) hours as needed for severe pain.     Dispense:  90 tablet    Refill:  2    Do not place this medication, or any other prescription from our practice, on "Automatic Refill". Patient may have prescription filled one day early if pharmacy is closed on scheduled refill date. Do not fill until: 09/09/2017 To last until: 12/08/2017    Order Specific Question:   Supervising Provider    Answer:   Milinda Pointer [phone]   New Prescriptions   No medications on file   Medications administered today: Rosalea A. Blanchette had no medications administered during this visit. Lab-work, procedure(s), and/or referral(s): Orders Placed This Encounter  Procedures  . ToxASSURE Select 13 (MW), Urine   Imaging and/or referral(s): None   Planned, scheduled, and/or pending: Not at this time.  Encourage patient to do with nutrition i.e. portion size, actual foods (low carbs) increase water intake and meal timing.   Considering: Diagnostic bilateral LESI Diagnostic bilateral lumbar facet nerve block Possible bilateral lumbar facet RFA Bilateral Hyalgan series Diagnostic bilateral intra-articular knee injection Diagnosticbilateral genicular nerve block Possible bilateral genicular RFA   PRN Procedures: None at this time     Provider-requested follow-up: Return in about 3 months (around 12/03/2017) for MedMgmt with Me Donella Stade Edison Pace).  Future Appointments  Date Time Provider [place]  09/17/2017  8:30 AM [facility] INJECTION [facility] None  10/15/2017  8:30 AM [facility] INJECTION [facility] None  11/12/2017  8:15 AM [facility] LAB [facility] None  11/12/2017  8:30 AM [facility] INJECTION [facility] None  11/26/2017 10:00 AM Shambley, Melody N, CNM [facility] None  12/02/2017  8:30 AM Vevelyn Francois, NP [facility] None  12/10/2017  8:30 AM [facility] INJECTION [facility] None  01/07/2018  8:30 AM [facility] INJECTION [facility] None  02/03/2018  8:00 AM [facility] LAB [facility] None  02/04/2018  9:45 AM Lequita Asal, MD [facility] None   02/04/2018 10:00 AM [facility] INFUSION CHAIR 3 [facility] None   Primary Care Physician: Zeb Comfort, MD Location: [facility] Management Facility Note by: Vevelyn Francois NP Date: 09/02/2017; Time: 3:58 PM  Pain Score Disclaimer: We use the NRS-11 scale. This is a self-reported, subjective measurement of pain severity with only modest accuracy. It is used primarily to identify changes within a particular patient. It must be understood that outpatient pain scales are significantly less accurate that those used for research, where they can be applied under ideal controlled circumstances with minimal exposure to variables. In reality, the score is likely to be a combination of pain intensity and pain affect, where pain affect describes the degree of emotional arousal or changes in action readiness caused by the sensory experience of pain. Factors such as social and work situation, setting, emotional state, anxiety levels, expectation, and prior pain experience may influence pain perception and show large inter-individual differences that may also be affected by time variables.  Patient instructions provided during this appointment: Patient Instructions   __________________________________________________________________________________________  Medication Rules  Applies to: All patients receiving prescriptions (written or electronic).  Pharmacy of record: Pharmacy where electronic prescriptions will be sent. If written prescriptions are taken to a different pharmacy, please inform the nursing staff. The pharmacy listed in the electronic medical record should be the one where you would like electronic prescriptions to be sent.  Prescription refills: Only during scheduled appointments. Applies to both, written and electronic prescriptions.  NOTE: The following applies primarily to controlled substances (Opioid* Pain Medications).  Patient's responsibilities: 1. Pain Pills: Bring all  pain pills to every appointment (except for procedure appointments). 2. Pill Bottles: Bring pills in original pharmacy bottle. Always bring newest bottle. Bring bottle, even if empty. 3. Medication refills: You are responsible for knowing and keeping track of what medications you need refilled. The day before your appointment, write a list of all prescriptions that need to be refilled. Bring that list to your appointment and give it to the admitting nurse. Prescriptions will be written only during appointments. If you forget a medication, it will not be "Called in", "Faxed", or "electronically sent". You will need to get another appointment to get these prescribed. 4. Prescription Accuracy: You are responsible for carefully inspecting your prescriptions before leaving our office. Have the discharge nurse carefully go over each prescription with you, before taking them home. Make sure that your name is accurately spelled, that your address is correct. Check the name and dose of your medication to make sure it is accurate. Check the number of pills, and the written instructions to make sure they are clear and accurate. Make sure that you are given enough medication to last until your next medication refill appointment. 5. Taking Medication: Take medication as prescribed. Never take more pills than instructed. Never take medication more frequently than prescribed. Taking less pills or less frequently is permitted and encouraged, when it comes to controlled substances (written prescriptions).  6. Inform other Doctors: Always inform, all of your healthcare providers, of all the medications you take. 7. Pain Medication from other Providers: You are not allowed to accept any additional pain medication from any other Doctor or Healthcare provider. There are two exceptions to this rule. (see below) In the event that you require additional pain medication, you are responsible for notifying us, as stated  below. 8. Medication Agreement: You are responsible for carefully reading and following our Medication Agreement. This must be signed before receiving any prescriptions from our practice. Safely store a copy of your signed Agreement. Violations to the Agreement will result in no further prescriptions. (Additional copies of our Medication Agreement are available upon request.) 9. Laws, Rules, & Regulations: All patients are expected to follow all Federal and [employer], [employer], Rules, [employer]. Ignorance of the Laws does not constitute a valid excuse. The use of any illegal substances is prohibited. 10. Adopted CDC guidelines & recommendations: Target dosing levels will be at or below 60 MME/day. Use of benzodiazepines** is not recommended.  Exceptions: There are only two exceptions to the rule of not receiving pain medications from other Healthcare Providers. 1. Exception #1 (Emergencies): In the event of an emergency (i.e.: accident requiring emergency care), you are allowed to receive additional pain medication. However, you are responsible for: As soon as you are able, call our office [phone], at any time of the day or night, and leave a message stating your name, the date and nature of the emergency, and the name and dose of the medication prescribed. In the event that your call is answered by a member of our staff, make sure to document and save the date, time, and the name of the person that took your information.  2. Exception #2 (Planned Surgery): In the event that you are scheduled by another doctor or dentist to have any type of surgery or procedure, you are allowed (for a period no longer than 30 days), to receive additional pain medication, for the acute post-op pain. However, in this case, you are responsible  for picking up a copy of our "Post-op Pain Management for Surgeons" handout, and giving it to your surgeon or dentist. This document is available at our office, and does not  require an appointment to obtain it. Simply go to our office during business hours (Monday-Thursday from 8:00 AM to 4:00 PM) (Friday 8:00 AM to 12:00 Noon) or if you have a scheduled appointment with [place], prior to your surgery, and ask for it by name. In addition, you will need to provide [place] with your name, name of your surgeon, type of surgery, and date of procedure or surgery.  *Opioid medications include: morphine, codeine, oxycodone, oxymorphone, hydrocodone, hydromorphone, meperidine, tramadol, tapentadol, buprenorphine, fentanyl, methadone. **Benzodiazepine medications include: diazepam (Valium), alprazolam (Xanax), clonazepam (Klonopine), lorazepam (Ativan), clorazepate (Tranxene), chlordiazepoxide (Librium), estazolam (Prosom), oxazepam (Serax), temazepam (Restoril), triazolam (Halcion) (Last updated: 03/27/2017) ____________________________________________________________________________________________   BMI Assessment: Estimated body mass index is 69.53 kg/m as calculated from the following:   Height as of this encounter: 5' 1"  (1.549 m).   Weight as of this encounter: 368 lb (166.9 kg).  BMI interpretation table: BMI level Category Range association with higher incidence of chronic pain  <18 kg/m2 Underweight   18.5-24.9 kg/m2 Ideal body weight   25-29.9 kg/m2 Overweight Increased incidence by 20%  30-34.9 kg/m2 Obese (Class I) Increased incidence by 68%  35-39.9 kg/m2 Severe obesity (Class II) Increased incidence by 136%  >40 kg/m2 Extreme obesity (Class III) Increased incidence by 254%   Patient's current BMI Ideal Body weight  Body mass index is 69.53 kg/m. Ideal body weight: 47.8 kg (105 lb 6.1 oz) Adjusted ideal body weight: 95.5 kg (210 lb 6.9 oz)   BMI Readings from Last 4 Encounters:  09/02/17 69.53 kg/m  08/20/17 69.56 kg/m  08/07/17 69.16 kg/m  07/09/17 68.21 kg/m   Wt Readings from Last 4 Encounters:  09/02/17 (!) 368 lb (166.9 kg)  08/20/17 (!) 368 lb 2.7  oz (167 kg)  08/07/17 (!) 366 lb (166 kg)  07/09/17 (!) 361 lb (163.7 kg)

## 2017-09-17 ENCOUNTER — Inpatient Hospital Stay: Payer: Medicaid Other

## 2017-09-17 ENCOUNTER — Inpatient Hospital Stay: Payer: Medicaid Other | Attending: Hematology and Oncology

## 2017-09-17 VITALS — BP 125/86 | HR 112 | Temp 96.8°F | Resp 21

## 2017-09-17 DIAGNOSIS — D509 Iron deficiency anemia, unspecified: Secondary | ICD-10-CM | POA: Diagnosis not present

## 2017-09-17 DIAGNOSIS — E538 Deficiency of other specified B group vitamins: Secondary | ICD-10-CM | POA: Insufficient documentation

## 2017-09-17 MED ORDER — CYANOCOBALAMIN 1000 MCG/ML IJ SOLN
1000.0000 ug | Freq: Once | INTRAMUSCULAR | Status: AC
  Administered 2017-09-17: 1000 ug via INTRAMUSCULAR

## 2017-09-25 ENCOUNTER — Emergency Department: Payer: Medicaid Other

## 2017-09-25 ENCOUNTER — Encounter: Payer: Self-pay | Admitting: *Deleted

## 2017-09-25 ENCOUNTER — Other Ambulatory Visit: Payer: Self-pay

## 2017-09-25 ENCOUNTER — Emergency Department
Admission: EM | Admit: 2017-09-25 | Discharge: 2017-09-25 | Disposition: A | Discharge status: Home / Self Care | Payer: Medicaid Other | Attending: Emergency Medicine | Admitting: Emergency Medicine

## 2017-09-25 DIAGNOSIS — B20 Human immunodeficiency virus [HIV] disease: Secondary | ICD-10-CM | POA: Insufficient documentation

## 2017-09-25 DIAGNOSIS — Z79899 Other long term (current) drug therapy: Secondary | ICD-10-CM | POA: Diagnosis not present

## 2017-09-25 DIAGNOSIS — I509 Heart failure, unspecified: Secondary | ICD-10-CM

## 2017-09-25 DIAGNOSIS — R0602 Shortness of breath: Secondary | ICD-10-CM | POA: Diagnosis present

## 2017-09-25 DIAGNOSIS — Z87891 Personal history of nicotine dependence: Secondary | ICD-10-CM | POA: Diagnosis not present

## 2017-09-25 DIAGNOSIS — I110 Hypertensive heart disease with heart failure: Secondary | ICD-10-CM | POA: Insufficient documentation

## 2017-09-25 DIAGNOSIS — I5032 Chronic diastolic (congestive) heart failure: Secondary | ICD-10-CM | POA: Diagnosis not present

## 2017-09-25 LAB — CBC
HEMATOCRIT: 38.8 % (ref 35.0–47.0)
HEMOGLOBIN: 12.4 g/dL (ref 12.0–16.0)
MCH: 25.4 pg — ABNORMAL LOW (ref 26.0–34.0)
MCHC: 31.9 g/dL — ABNORMAL LOW (ref 32.0–36.0)
MCV: 79.7 fL — ABNORMAL LOW (ref 80.0–100.0)
Platelets: 223 10*3/uL (ref 150–440)
RBC: 4.87 MIL/uL (ref 3.80–5.20)
RDW: 17.2 % — ABNORMAL HIGH (ref 11.5–14.5)
WBC: 9.4 10*3/uL (ref 3.6–11.0)

## 2017-09-25 LAB — BASIC METABOLIC PANEL
ANION GAP: 9 (ref 5–15)
BUN: 9 mg/dL (ref 6–20)
CALCIUM: 8.9 mg/dL (ref 8.9–10.3)
CHLORIDE: 101 mmol/L (ref 98–111)
CO2: 31 mmol/L (ref 22–32)
Creatinine, Ser: 0.72 mg/dL (ref 0.44–1.00)
GFR calc non Af Amer: >60 mL/min (ref >60)
Glucose, Bld: 121 mg/dL — ABNORMAL HIGH (ref 70–99)
POTASSIUM: 3.3 mmol/L — AB (ref 3.5–5.1)
Sodium: 141 mmol/L (ref 135–145)

## 2017-09-25 LAB — TROPONIN I

## 2017-09-25 IMAGING — CR DG CHEST 2V
1 series · 2 of 2 positions shown · non-contrast
Comparison: Radiographs [DATE].

CLINICAL DATA: Shortness of breath.

EXAM:
CHEST - 2 VIEW

[Series 1: dg chest 2 view · 0.14mm/px · 2 of 2 slices shown]
[im 1/2]
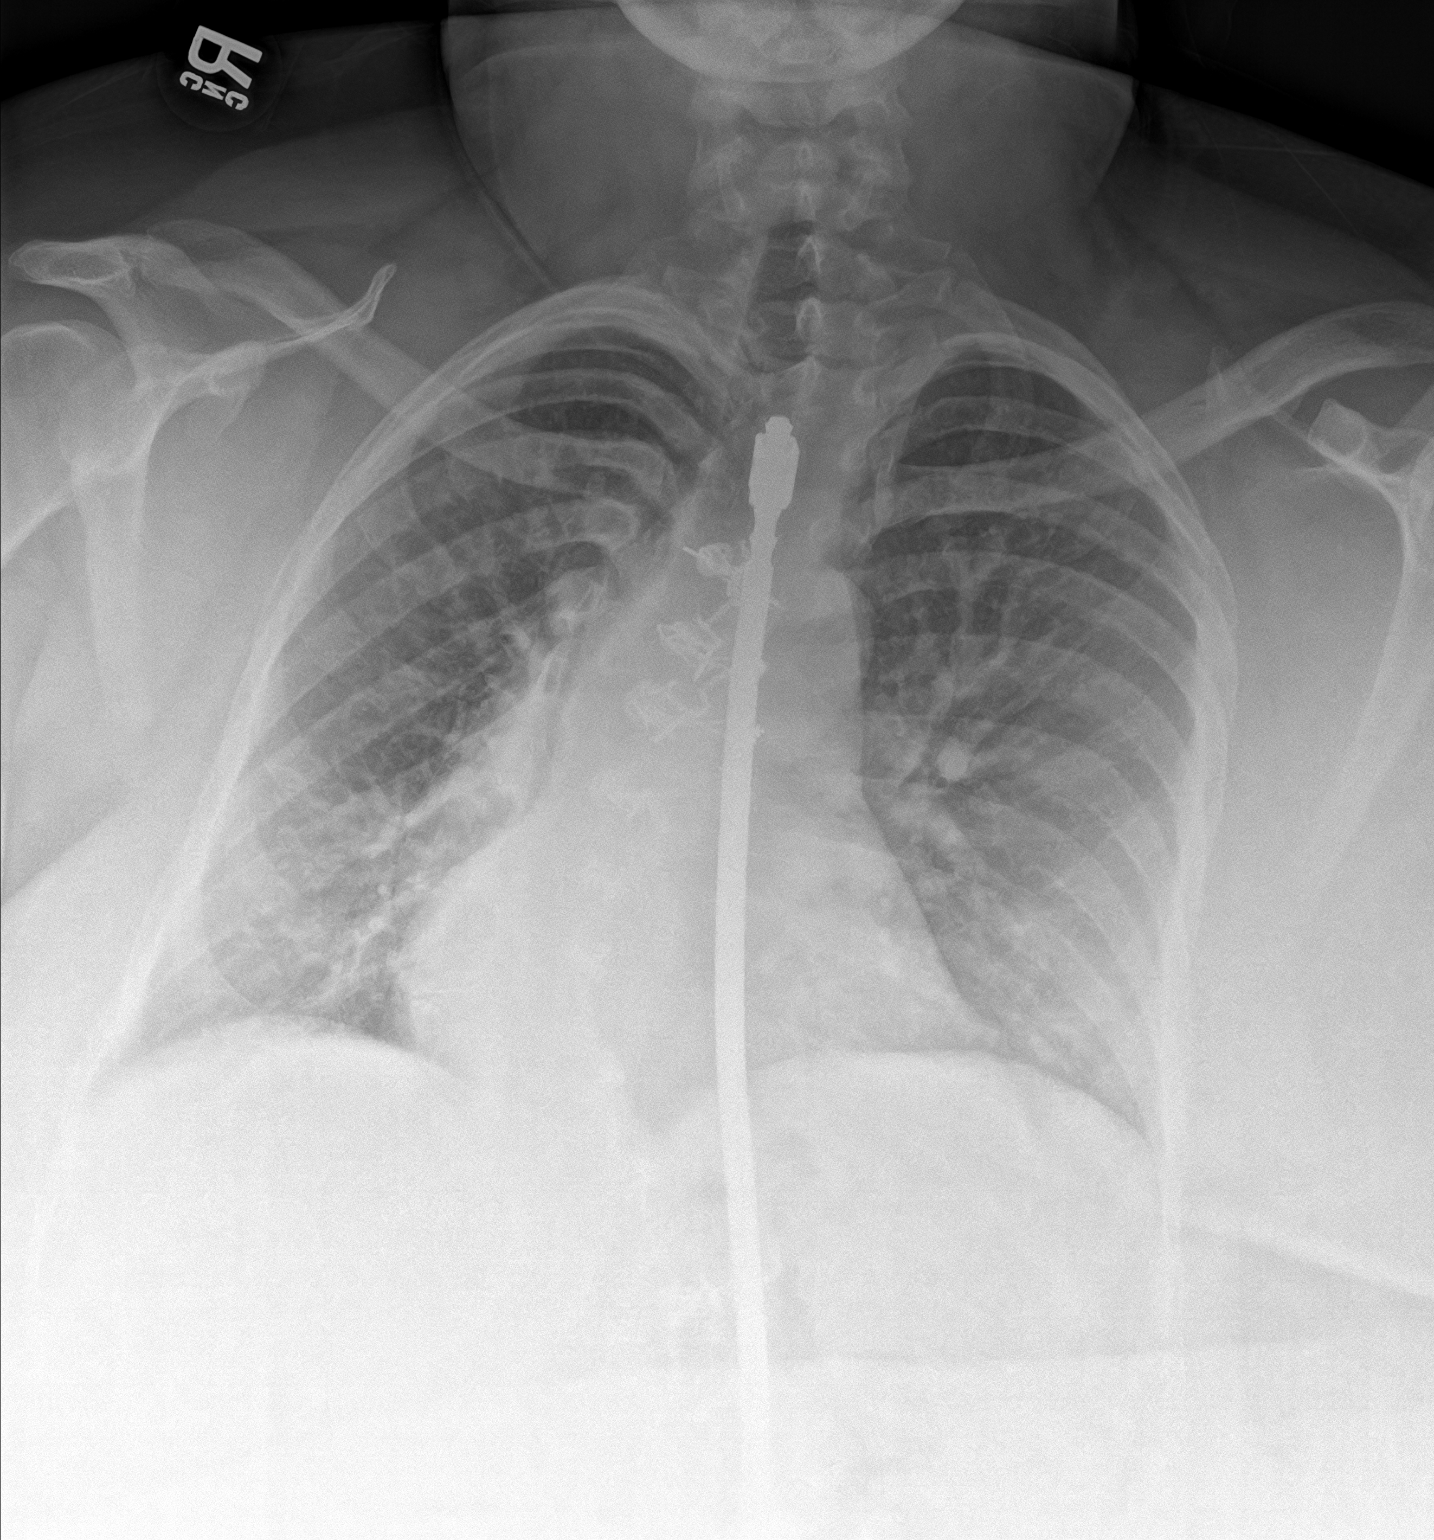
[im 2/2]
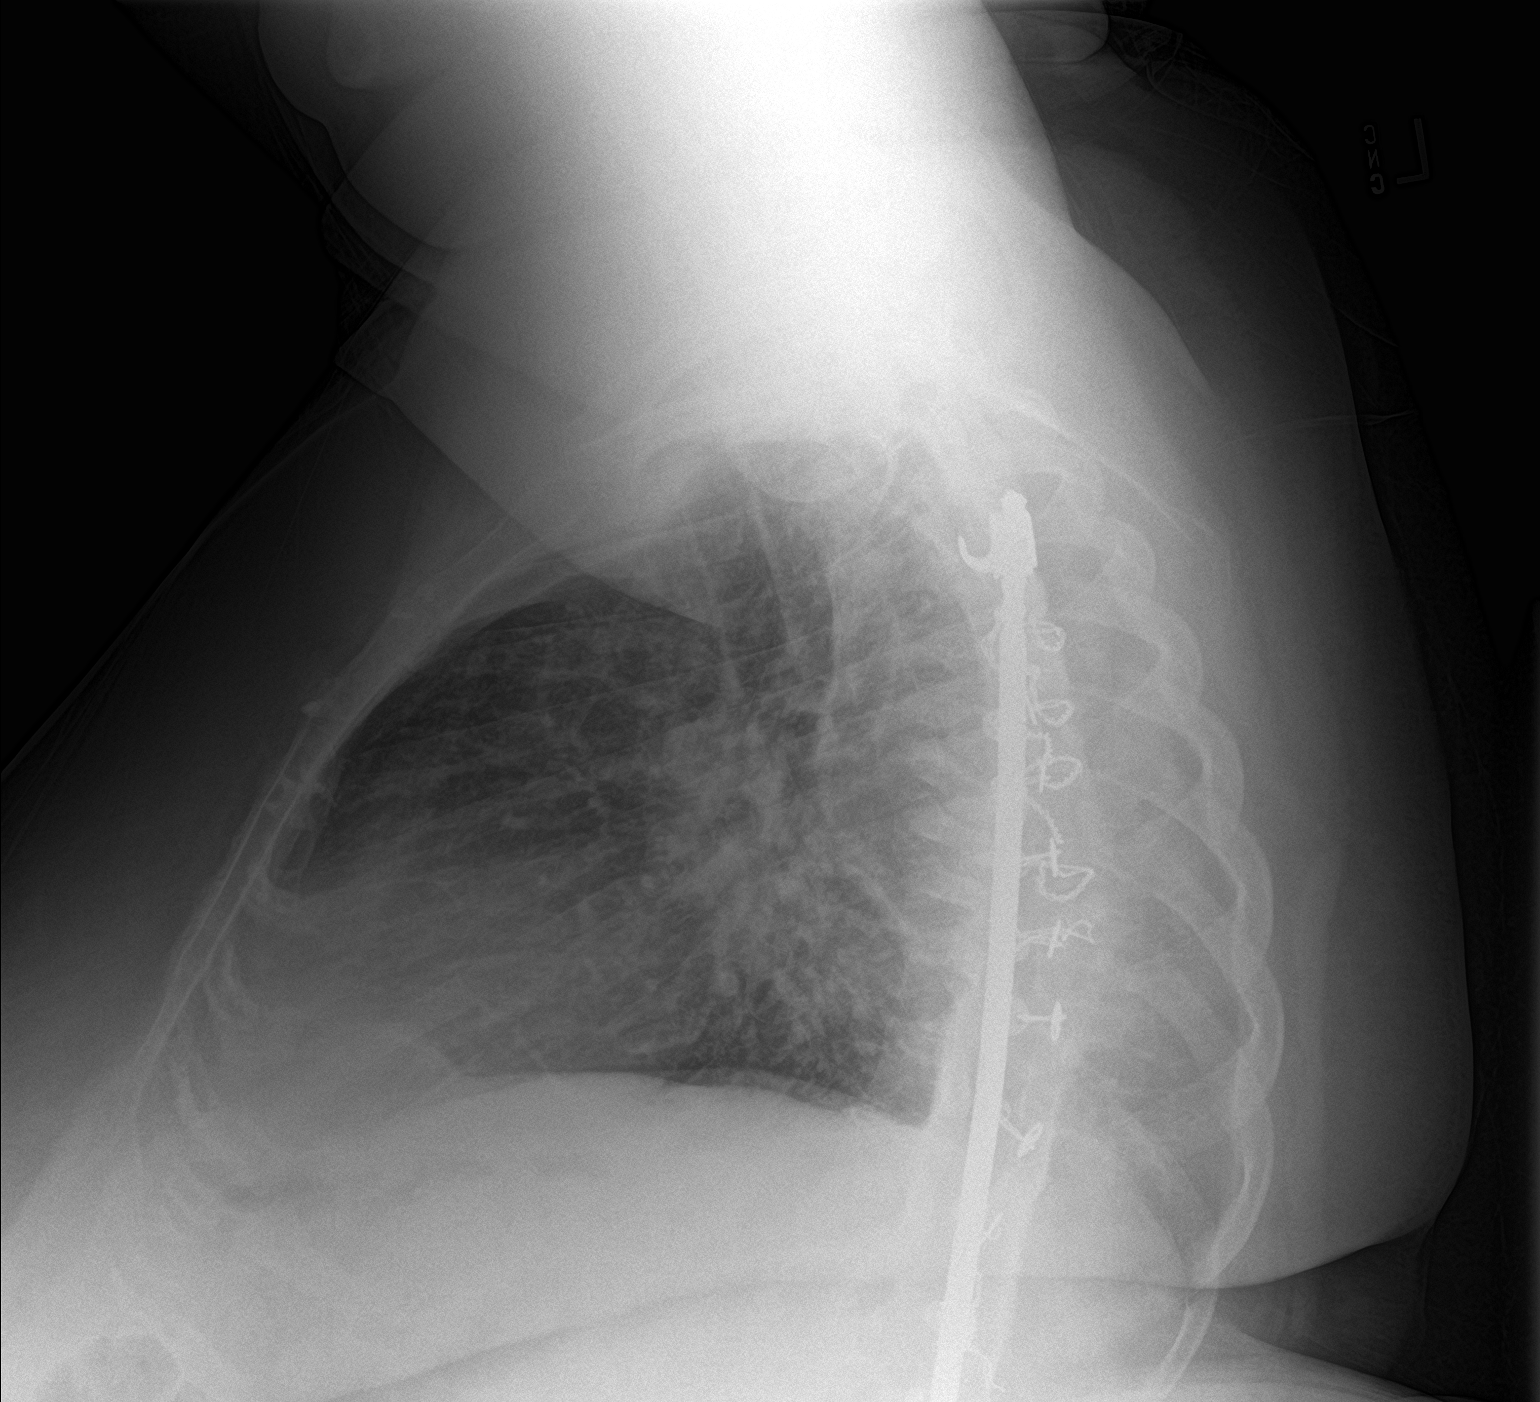

[2 of 2 positions shown; findings below may reference images not displayed]

FINDINGS: The heart size and mediastinal contours are within normal limits.
Both lungs are clear. No pneumothorax or pleural effusion is noted.
Stable position of Harrington rod for dextroscoliosis.
IMPRESSION: No active cardiopulmonary disease.

## 2017-09-25 NOTE — ED Triage Notes (Addendum)
Pt to ED reporting SOB and swelling in her feet for the past three days. Pt reports increased anxiety as well. Pt is able to speak in complete sentences while in triage. PCP sent pt to ED due to a low oxygen saturation of 88% on RA. PT reports she has been on oxygen before but was taken off.   Pt has hx of CHF and reports having gained 10 pounds in a week.

## 2017-09-25 NOTE — ED Notes (Signed)
Pt RA sat after moving from stretcher to bed 88%, after several minutes of not moving, pt RA sat 97%.

## 2017-09-25 NOTE — ED Provider Notes (Signed)
[facility] Emergency Department Provider Note  Time seen: 5:33 PM  I have reviewed the triage vital signs and the nursing notes.   HISTORY  Chief Complaint Shortness of Breath    HPI Tracey White is a 37 y.o. female with a past medical history of CHF, anxiety, obesity, presents to the emergency department with shortness of breath.  According to the patient she got a new job 3 days ago which required her to do a lot more walking.  She states over the past 3 days she has noticed increased swelling in her lower extremities and increased shortness of breath while ambulating.  Currently lying in bed she denies any shortness of breath or chest pain.  No chest pain when ambulating.   Past Medical History:  Diagnosis Date  . Anxiety   . CHF (congestive heart failure) (HCC)   . Chronic heart failure (HCC)   . Morbid obesity (HCC)   . OSA (obstructive sleep apnea)    uses CPAP at home  . Oxygen deficiency 02/28/2017   3 liters  . Peripheral edema   . Scoliosis     Patient Active Problem List   Diagnosis Date Noted  . Hypertensive heart disease 07/22/2017  . Long term current use of opiate analgesic 06/02/2017  . Goals of care, counseling/discussion 05/07/2017  . AIDS (HCC) 05/07/2017  . B12 deficiency 04/16/2017  . Chronic diastolic congestive heart failure (HCC) 04/02/2017  . Iron deficiency anemia 03/19/2017  . ANA positive 02/04/2017  . Positive antinuclear antibody 02/04/2017  . 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 Pharmacologic therapy 01/08/2017  . Problems influencing health status 01/08/2017  . Chronic lower extremity pain [facility] Area of Pain) (Bilateral) (R>L) 01/08/2017  . Elevated C-reactive protein (CRP) 01/08/2017  . Elevated sed rate 01/08/2017  . Vitamin D deficiency 01/08/2017  . Class 3 obesity with alveolar hypoventilation, serious comorbidity, and body mass index (BMI) of 60.0 to 69.9 in adult  (HCC) 01/08/2017  . Acute diastolic heart failure (HCC) 01/01/2017  . Anemia 12/25/2016  . Asthma without status asthmaticus 12/24/2016  . Chronic knee pain (Secondary Area of Pain) (Bilateral) (R>L) 12/12/2016  . Chronic thoracic back pain (Fourth Area of Pain) (Bilateral) (R>L) 12/12/2016  . Chronic pain syndrome 12/12/2016  . Disorder of skeletal system 12/12/2016  . Other long term (current) drug therapy 12/12/2016  . Scoliosis 12/12/2016  . Lymphedema 09/24/2016  . Skin lesion of left leg 09/05/2016  . Depression 08/27/2016  . Chronic low back pain (Primary Area of Pain) (Bilateral) (L>R) 08/27/2016  . Hypokalemia 06/06/2016  . Chest pain 05/31/2016  . Chronic diastolic heart failure (HCC) 05/15/2016  . Obstructive sleep apnea on CPAP 05/15/2016  . Tobacco use 05/15/2016  . Sinus tachycardia 10/31/2015  . Chest pain at rest 10/18/2015    Past Surgical History:  Procedure Laterality Date  . HERNIA REPAIR    . scoliosis repair    . TUBAL LIGATION      Prior to Admission medications   Medication Sig Start Date End Date Taking? Authorizing Provider  albuterol (PROVENTIL HFA;VENTOLIN HFA) 108 (90 Base) MCG/ACT inhaler Inhale 2 puffs into the lungs every 4 (four) hours as needed for wheezing or shortness of breath. 03/24/17   Triplett, Cari B, FNP  ARIPiprazole (ABILIFY) 2 MG tablet Take 10 mg by mouth daily.     [provider]  budesonide-formoterol (SYMBICORT) 160-4.5 MCG/ACT inhaler Inhale 2 puffs into the lungs 2 (two)  times daily.    [provider]  bumetanide (BUMEX) 2 MG tablet Take 2 mg by mouth 2 (two) times daily.     [provider]  buPROPion (WELLBUTRIN) 75 MG tablet Take 150 mg by mouth daily.     [provider]  diclofenac (VOLTAREN) 75 MG EC tablet Take 1 tablet (75 mg total) by mouth 2 (two) times daily after a meal. 08/07/17   Edward Jolly, MD  diclofenac sodium (VOLTAREN) 1 % GEL Apply 4 (four) times daily topically.     [provider]  diltiazem (CARDIZEM) 120 MG tablet Take 120 mg daily by mouth.    [provider]  escitalopram (LEXAPRO) 20 MG tablet Take 20 mg by mouth daily.     [provider]  ferrous sulfate 325 (65 FE) MG tablet Take 325 mg by mouth daily with breakfast.    [provider]  lidocaine (LIDODERM) 5 % Place 1 patch daily onto the skin. Remove & Discard patch within 12 hours or as directed by MD    [provider]  losartan (COZAAR) 25 MG tablet Take 1 tablet (25 mg total) by mouth daily. 04/14/17   Delma Freeze, FNP  meloxicam (MOBIC) 15 MG tablet Take 15 mg by mouth daily as needed for pain.    [provider]  metoprolol succinate (TOPROL-XL) 50 MG 24 hr tablet Take 1 tablet (50 mg total) by mouth daily. Take with or immediately following a meal. 04/14/17 08/07/17  Clarisa Kindred A, FNP  montelukast (SINGULAIR) 10 MG tablet Take 10 mg by mouth at bedtime.    [provider]  norgestimate-ethinyl estradiol (SPRINTEC 28) 0.25-35 MG-MCG tablet Take 1 tablet by mouth daily.    [provider]  potassium chloride SA (K-DUR,KLOR-CON) 20 MEQ tablet Take 40 mEq by mouth 2 (two) times daily.    [provider]  traMADol (ULTRAM) 50 MG tablet Take 1 tablet (50 mg total) by mouth every 8 (eight) hours as needed for severe pain. 09/09/17 12/08/17  Barbette Merino, NP  Vitamin D, Ergocalciferol, (DRISDOL) 50000 units CAPS capsule Take 50,000 Units by mouth every 7 (seven) days.    [provider]    No Known Allergies  Family History  Problem Relation Age of Onset  . Diabetes Mother   . Hypertension Mother   . Cancer Maternal Grandmother     Social History Social History   Tobacco Use  . Smoking status: Former Smoker    Packs/day: 0.20    Types: Cigarettes    Last attempt to quit: 02/16/2017    Years since quitting: 0.6  . Smokeless tobacco: Never Used  . Tobacco comment: hasn't smoked in 3-4 months   Substance Use Topics  . Alcohol use: Yes    Alcohol/week: 1.0 - 2.0 standard drinks    Types: 1 - 2 Standard drinks or equivalent per week    Comment: occasionally  . Drug use: No    Review of Systems Constitutional: Negative for fever. Cardiovascular: Negative for chest pain. Respiratory: Shortness of breath with exertion x3 days Gastrointestinal: Negative for abdominal pain, vomiting and diarrhea. Musculoskeletal: Increased lower extremity edema x3 days Skin: Negative for skin complaints  Neurological: Negative for headache All other ROS negative  ____________________________________________   PHYSICAL EXAM:  VITAL SIGNS: ED Triage Vitals  Enc Vitals Group     BP 09/25/17 1645 135/74     Pulse Rate 09/25/17 1645 (!) 108     Resp 09/25/17  1645 20     Temp 09/25/17 1645 98.4 F (36.9 C)     Temp Source 09/25/17 1645 Oral     SpO2 09/25/17 1645 96 %     Weight 09/25/17 1646 (!) 367 lb 15.2 oz (166.9 kg)     Height 09/25/17 1646 5\' 1"  (1.549 m)     Head Circumference --      Peak Flow --      Pain Score 09/25/17 1645 9     Pain Loc --      Pain Edu? --      Excl. in GC? --    Constitutional: Alert and oriented. Well appearing and in no distress. Eyes: Normal exam ENT   Head: Normocephalic and atraumatic.   Mouth/Throat: Mucous membranes are moist. Cardiovascular: Normal rate, regular rhythm. No murmur Respiratory: Normal respiratory effort without tachypnea nor retractions. Breath sounds are clear  Gastrointestinal: Soft and nontender. No distention.  Musculoskeletal: Nontender with normal range of motion in all extremities.  Neurologic:  Normal speech and language. No gross focal neurologic deficits  Skin:  Skin is warm, dry and intact.  Psychiatric: Mood and affect are normal.   ____________________________________________    EKG  EKG reviewed and interpreted by myself shows sinus tachycardia 106 bpm with a narrow QRS, normal axis, normal intervals,  no ST changes  ____________________________________________    RADIOLOGY  Chest x-ray negative  ____________________________________________   INITIAL IMPRESSION / ASSESSMENT AND PLAN / ED COURSE  Pertinent labs & imaging results that were available during my care of the patient were reviewed by me and considered in my medical decision making (see chart for details).  Patient presents to the emergency department for shortness of breath with exertion x3 days with increased lower extremity edema x3 days.  Overall the patient appears very well satting 100% currently on room air.  Differential would include CHF exacerbation, pneumonia, pneumothorax, ACS.  We will check labs.  Chest x-ray and continue to closely monitor.  Patient's chest x-ray is negative.  Patient did briefly desat 89% when ambulating however while lying in bed she is 100%.  Patient currently takes 4 mg of Bumex daily.  Labs are pending.  Patient's labs are reassuring, troponin negative.  I discussed the options with the patient as far as admission to the hospital for IV diuresis as she desatted to 89/88% when ambulating.  Currently she remains 100% on room air while lying in bed.  Patient states she would strongly prefer to go home I believe this is a reasonable plan of care.  I discussed with the patient increasing her Bumex from 4 mg daily to 6 mg daily for the next 5 days and following up with her doctor early next week.  Patient agreeable plan of care.  Also discussed return precautions for any worsening shortness of breath or development of chest pain.  ____________________________________________   FINAL CLINICAL IMPRESSION(S) / ED DIAGNOSES  Dyspnea    Minna AntisPaduchowski, Dagan Heinz, MD 09/25/17 [phone]

## 2017-09-25 NOTE — Discharge Instructions (Addendum)
As we discussed please increase her Bumex from 4 mg daily to 6 mg daily for the next 5 days.  Please follow-up with your doctor on Monday for recheck/reevaluation.  Return to the emergency department for any worsening shortness of breath or development of any chest pain.

## 2017-09-28 NOTE — Progress Notes (Deleted)
Patient ID: Tracey White, female    DOB: [birth year], 37 y.o.   MRN: [id number]  HPI  Tracey White is a 37 y/o female with a history of anxiety, obstructive sleep apnea (with nasal CPAP), scoliosis, recent tobacco use and chronic heart failure.   Echo done 04/04/17 at [facility] showed an EF of >55%. Reviewed last echo report done 10/19/15 which showed an EF of 65-70% along with trivial TR.   Was in the ED 09/25/17 due to shortness of breath. CXR and labs were negative. Diuretic was increased and she was released.  Admitted 04/02/17 due to 20 pound weight gain over 48 hours. Initially given IV bumex and then transitioned to oral diuretics. Discharged after 3 days. Was in the ED 03/31/17 due to shortness of breath where she was treated and released.   She presents today for a follow-up visit with a chief complaint of   Past Medical History:  Diagnosis Date  . Anxiety   . CHF (congestive heart failure) (HCC)   . Chronic heart failure (HCC)   . Morbid obesity (HCC)   . OSA (obstructive sleep apnea)    uses CPAP at home  . Oxygen deficiency 02/28/2017   3 liters  . Peripheral edema   . Scoliosis    Past Surgical History:  Procedure Laterality Date  . HERNIA REPAIR    . scoliosis repair    . TUBAL LIGATION     Family History  Problem Relation Age of Onset  . Diabetes Mother   . Hypertension Mother   . Cancer Maternal Grandmother    Social History   Tobacco Use  . Smoking status: Former Smoker    Packs/day: 0.20    Types: Cigarettes    Last attempt to quit: 02/16/2017    Years since quitting: 0.6  . Smokeless tobacco: Never Used  . Tobacco comment: hasn't smoked in 3-4 months  Substance Use Topics  . Alcohol use: Yes    Alcohol/week: 1.0 - 2.0 standard drinks    Types: 1 - 2 Standard drinks or equivalent per week    Comment: occasionally   No Known Allergies    Review of Systems  Constitutional: Positive for fatigue (better with CPAP). Negative for appetite change.  HENT: Negative  for congestion, postnasal drip and sore throat.   Eyes: Negative.   Respiratory: Positive for cough and shortness of breath. Negative for chest tightness and wheezing.   Cardiovascular: Negative for chest pain, palpitations and leg swelling.  Gastrointestinal: Negative for abdominal distention and abdominal pain.  Endocrine: Negative.   Genitourinary: Negative.   Musculoskeletal: Positive for arthralgias (knee pain improving) and back pain.  Skin: Negative.   Allergic/Immunologic: Negative.   Neurological: Negative for dizziness, weakness and light-headedness.  Hematological: Negative for adenopathy. Does not bruise/bleed easily.  Psychiatric/Behavioral: Positive for dysphoric mood. Negative for sleep disturbance (has resumed wearing CPAP) and suicidal ideas. The patient is not nervous/anxious.      Physical Exam  Constitutional: She is oriented to person, place, and time. She appears well-developed and well-nourished.  HENT:  Head: Normocephalic and atraumatic.  Neck: Normal range of motion. Neck supple. No JVD present.  Cardiovascular: Normal rate and regular rhythm.  Pulmonary/Chest: Effort normal. She has no wheezes. She has no rhonchi. She has no rales.  Abdominal: Soft. She exhibits no distension. There is no tenderness.  Musculoskeletal: She exhibits no edema or tenderness.  Neurological: She is alert and oriented to person, place, and time.  Skin: Skin is  warm and dry.  Psychiatric: She has a normal mood and affect. Her behavior is normal. Thought content normal.  Nursing note and vitals reviewed.  Assessment & Plan:  1: Chronic heart failure with preserved ejection fraction- - NYHA class III - euvolemic today today -weighing daily. Reminded her to call for an overnight weight gain of >2 pounds or a weekly weight gain of >5 pounds.  - weight  - not adding salt and has been trying to read food labels more often.  - saw cardiologist (Wall) 07/22/17 - BMP from 09/25/17  reviewed and showed sodium 141, potassium 3.3, creatinine 0.72  and GFR >60 - had bariatric appointment 04/10/17; # given to [facility] to see about 2nd opinion - BNP 03/24/17 was 17.0  2: Obstructive sleep apnea- - wears her CPAP but only for about an hour at the most each night - encouraged her to wear it during the day when she rests so that she can get used to wearing it for longer periods of time - saw PCP Ivory Broad) 09/25/17  3: Lymphedema- - stage 1 lymphedema  - has been wearing compression boots daily with marked improvement in her edema. Currently has no edema present  4: Tobacco use- - smoking 1 cigarette infrequently - discussed complete cessation for 3 minutes with her  Medication bottles were reviewed.

## 2017-09-30 ENCOUNTER — Encounter: Payer: Self-pay | Admitting: Family

## 2017-09-30 ENCOUNTER — Ambulatory Visit: Payer: Self-pay | Admitting: Family

## 2017-09-30 ENCOUNTER — Ambulatory Visit: Payer: Medicaid Other | Attending: Family | Admitting: Family

## 2017-09-30 VITALS — BP 151/84 | HR 106 | Resp 18 | Ht 61.0 in | Wt 364.4 lb

## 2017-09-30 DIAGNOSIS — Z79899 Other long term (current) drug therapy: Secondary | ICD-10-CM | POA: Diagnosis not present

## 2017-09-30 DIAGNOSIS — Z8249 Family history of ischemic heart disease and other diseases of the circulatory system: Secondary | ICD-10-CM | POA: Insufficient documentation

## 2017-09-30 DIAGNOSIS — Z87891 Personal history of nicotine dependence: Secondary | ICD-10-CM | POA: Insufficient documentation

## 2017-09-30 DIAGNOSIS — I5032 Chronic diastolic (congestive) heart failure: Secondary | ICD-10-CM | POA: Insufficient documentation

## 2017-09-30 DIAGNOSIS — I890 Lymphedema, not elsewhere classified: Secondary | ICD-10-CM | POA: Insufficient documentation

## 2017-09-30 DIAGNOSIS — R5383 Other fatigue: Secondary | ICD-10-CM | POA: Diagnosis present

## 2017-09-30 DIAGNOSIS — Z7951 Long term (current) use of inhaled steroids: Secondary | ICD-10-CM | POA: Insufficient documentation

## 2017-09-30 DIAGNOSIS — M419 Scoliosis, unspecified: Secondary | ICD-10-CM | POA: Insufficient documentation

## 2017-09-30 DIAGNOSIS — G4733 Obstructive sleep apnea (adult) (pediatric): Secondary | ICD-10-CM | POA: Insufficient documentation

## 2017-09-30 DIAGNOSIS — Z720 Tobacco use: Secondary | ICD-10-CM

## 2017-09-30 DIAGNOSIS — F419 Anxiety disorder, unspecified: Secondary | ICD-10-CM | POA: Insufficient documentation

## 2017-09-30 DIAGNOSIS — Z791 Long term (current) use of non-steroidal anti-inflammatories (NSAID): Secondary | ICD-10-CM | POA: Diagnosis not present

## 2017-09-30 NOTE — Patient Instructions (Addendum)
Continue weighing daily and call for an overnight weight gain of > 2 pounds or a weekly weight gain of >5 pounds.  Check BMP at primary care office tomorrow

## 2017-09-30 NOTE — Progress Notes (Signed)
Patient ID: Tracey White, female    DOB: February 09, 1980, 37 y.o.   MRN: [id number]  HPI  Tracey White is a 37 y/o female with a history of anxiety, obstructive sleep apnea (with nasal CPAP), scoliosis, recent tobacco use and chronic heart failure.   Echo done 04/04/17 at [facility] showed an EF of >55%. Reviewed last echo report done 10/19/15 which showed an EF of 65-70% along with trivial TR.   Was in the ED 09/25/17 due to shortness of breath. CXR and labs were negative. Diuretic was increased and she was released.  Admitted 04/02/17 due to 20 pound weight gain over 48 hours. Initially given IV bumex and then transitioned to oral diuretics. Discharged after 3 days. Was in the ED 03/31/17 due to shortness of breath where she was treated and released.   She presents today for a follow-up visit with a chief complaint of moderate fatigue upon minimal exertion. She describes this as chronic in nature having been present for several years. She has associated cough, pedal edema, shortness of breath, nausea, headache and depression along with this. She denies any abdominal distention, palpitations, chest pain, dizziness or weight gain. She says that her bumex had been increased to 6mg  for the last 5 days and that her home weight dropped ~ 15 pounds and edema improved dramatically.   Past Medical History:  Diagnosis Date  . Anxiety   . CHF (congestive heart failure) (HCC)   . Chronic heart failure (HCC)   . Morbid obesity (HCC)   . OSA (obstructive sleep apnea)    uses CPAP at home  . Oxygen deficiency 02/28/2017   3 liters  . Peripheral edema   . Scoliosis    Past Surgical History:  Procedure Laterality Date  . HERNIA REPAIR    . scoliosis repair    . TUBAL LIGATION     Family History  Problem Relation Age of Onset  . Diabetes Mother   . Hypertension Mother   . Cancer Maternal Grandmother    Social History   Tobacco Use  . Smoking status: Former Smoker    Packs/day: 0.20    Types: Cigarettes    Last  attempt to quit: 02/16/2017    Years since quitting: 0.6  . Smokeless tobacco: Never Used  . Tobacco comment: hasn't smoked in 3-4 months  Substance Use Topics  . Alcohol use: Yes    Alcohol/week: 1.0 - 2.0 standard drinks    Types: 1 - 2 Standard drinks or equivalent per week    Comment: occasionally   No Known Allergies  Prior to Admission medications   Medication Sig Start Date End Date Taking? Authorizing Provider  albuterol (PROVENTIL HFA;VENTOLIN HFA) 108 (90 Base) MCG/ACT inhaler Inhale 2 puffs into the lungs every 4 (four) hours as needed for wheezing or shortness of breath. 03/24/17  Yes Triplett, Cari B, FNP  ARIPiprazole (ABILIFY) 2 MG tablet Take 10 mg by mouth daily.    Yes [provider]  budesonide-formoterol (SYMBICORT) 160-4.5 MCG/ACT inhaler Inhale 2 puffs into the lungs 2 (two) times daily.   Yes [provider]  bumetanide (BUMEX) 2 MG tablet Take 6 mg by mouth 2 (two) times daily.    Yes [provider]  buPROPion (WELLBUTRIN) 75 MG tablet Take 150 mg by mouth daily.    Yes [provider]  diclofenac (VOLTAREN) 75 MG EC tablet Take 1 tablet (75 mg total) by mouth 2 (two) times daily after a meal. 08/07/17  Yes  Edward Jolly, MD  diclofenac sodium (VOLTAREN) 1 % GEL Apply 4 (four) times daily topically.   Yes [provider]  diltiazem (CARDIZEM) 120 MG tablet Take 120 mg daily by mouth.   Yes [provider]  escitalopram (LEXAPRO) 20 MG tablet Take 20 mg by mouth daily.    Yes [provider]  ferrous sulfate 325 (65 FE) MG tablet Take 325 mg by mouth daily with breakfast.   Yes [provider]  lidocaine (LIDODERM) 5 % Place 1 patch daily onto the skin. Remove & Discard patch within 12 hours or as directed by MD   Yes [provider]  losartan (COZAAR) 25 MG tablet Take 1 tablet (25 mg total) by mouth daily. 04/14/17  Yes Tracey White A, FNP  meloxicam (MOBIC) 15 MG tablet Take 15 mg by  mouth daily as needed for pain.   Yes [provider]  montelukast (SINGULAIR) 10 MG tablet Take 10 mg by mouth at bedtime.   Yes [provider]  norgestimate-ethinyl estradiol (SPRINTEC 28) 0.25-35 MG-MCG tablet Take 1 tablet by mouth daily.   Yes [provider]  potassium chloride SA (K-DUR,KLOR-CON) 20 MEQ tablet Take 40 mEq by mouth 2 (two) times daily.   Yes [provider]  traMADol (ULTRAM) 50 MG tablet Take 1 tablet (50 mg total) by mouth every 8 (eight) hours as needed for severe pain. 09/09/17 12/08/17 Yes Barbette Merino, NP  Vitamin D, Ergocalciferol, (DRISDOL) 50000 units CAPS capsule Take 50,000 Units by mouth every 7 (seven) days.   Yes [provider]  metoprolol succinate (TOPROL-XL) 50 MG 24 hr tablet Take 1 tablet (50 mg total) by mouth daily. Take with or immediately following a meal. Patient not taking: Reported on 09/30/2017 04/14/17 09/30/17  Delma Freeze, FNP    Review of Systems  Constitutional: Positive for fatigue (better with CPAP). Negative for appetite change.  HENT: Negative for congestion, postnasal drip and sore throat.   Eyes: Negative.   Respiratory: Positive for cough and shortness of breath. Negative for chest tightness and wheezing.   Cardiovascular: Positive for leg swelling. Negative for chest pain and palpitations.  Gastrointestinal: Positive for nausea. Negative for abdominal distention and abdominal pain.  Endocrine: Negative.   Genitourinary: Negative.   Musculoskeletal: Positive for arthralgias and back pain.  Skin: Negative.   Allergic/Immunologic: Negative.   Neurological: Positive for headaches. Negative for dizziness, weakness and light-headedness.  Hematological: Negative for adenopathy. Does not bruise/bleed easily.  Psychiatric/Behavioral: Positive for dysphoric mood. Negative for sleep disturbance and suicidal ideas. The patient is not nervous/anxious.    Vitals:   09/30/17 1345  BP: (!)  151/84  Pulse: (!) 106  Resp: 18  SpO2: 90%  Weight: (!) 364 lb 6 oz (165.3 kg)  Height: 5\' 1"  (1.549 m)   Wt Readings from Last 3 Encounters:  09/30/17 (!) 364 lb 6 oz (165.3 kg)  09/25/17 (!) 367 lb 15.2 oz (166.9 kg)  09/02/17 (!) 368 lb (166.9 kg)   Lab Results  Component Value Date   CREATININE 0.72 09/25/2017   CREATININE 0.74 03/24/2017   CREATININE 0.76 01/13/2017    Physical Exam  Constitutional: She is oriented to person, place, and time. She appears well-developed and well-nourished.  HENT:  Head: Normocephalic and atraumatic.  Neck: Normal range of motion. Neck supple. No JVD present.  Cardiovascular: Normal rate and regular rhythm.  Pulmonary/Chest: Effort normal. She has no wheezes. She has no rhonchi. She has no rales.  Abdominal: Soft. She exhibits no distension. There is no tenderness.  Musculoskeletal: She exhibits edema (1+ pitting in bilateral lower legs). She exhibits no tenderness.  Neurological: She is alert and oriented to person, place, and time.  Skin: Skin is warm and dry.  Psychiatric: She has a normal mood and affect. Her behavior is normal. Thought content normal.  Nursing note and vitals reviewed.  Assessment & Plan:  1: Chronic heart failure with preserved ejection fraction- - NYHA class III - minimally fluid overloaded today with pedal edema - weighing daily. Reminded her to call for an overnight weight gain of >2 pounds or a weekly weight gain of >5 pounds.  - weight up 13 pounds since she was last here 6 months ago although she says that her home weight decreased - not adding salt and has been trying to read food labels more often.  - saw cardiologist (Wall) 07/22/17 - BMP from 09/25/17 reviewed and showed sodium 141, potassium 3.3, creatinine 0.72  and GFR >60 - has bariatric appointment at [facility] sometime in November  - BNP 03/24/17 was 17.0 - discussed getting lab work today since she had her bumex increased for several days but she  would prefer her PCP to check it tomorrow  2: Obstructive sleep apnea- - had recent sleep study - awaiting on bipap equipment - saw PCP Ivory Broad) 09/25/17 - says that her oxygen level drops into the 80's upon exertion and that she's worn oxygen in the past. Encouraged her to discuss with PCP tomorrow about whether oxygen needs to be reordered  3: Lymphedema- - stage 1 lymphedema  - has been wearing compression boots daily - says that her edema has improved with taking extra bumex  4: Tobacco use- - no tobacco in the last 2 months - discussed complete cessation for 3 minutes with her  Patient did not bring her medications nor a list. Each medication was verbally reviewed with the patient and she was encouraged to bring the bottles to every visit to confirm accuracy of list.  Return in 1 month or sooner for any questions/problems before then.

## 2017-10-15 ENCOUNTER — Inpatient Hospital Stay: Payer: Medicaid Other | Attending: Hematology and Oncology

## 2017-10-15 ENCOUNTER — Inpatient Hospital Stay: Payer: Medicaid Other

## 2017-10-27 NOTE — Progress Notes (Deleted)
Patient ID: Tracey White, female    DOB: Apr 02, 1980, 37 y.o.   MRN: [id number]  HPI  Tracey White is a 37 y/o female with a history of anxiety, obstructive sleep apnea (with nasal CPAP), scoliosis, recent tobacco use and chronic heart failure.   Echo done 04/04/17 at [facility] showed an EF of >55%. Reviewed last echo report done 10/19/15 which showed an EF of 65-70% along with trivial TR.   Was in the ED 09/25/17 due to shortness of breath. CXR and labs were negative. Diuretic was increased and she was released.    She presents today for a follow-up visit with a chief complaint of   Past Medical History:  Diagnosis Date  . Anxiety   . CHF (congestive heart failure) (HCC)   . Chronic heart failure (HCC)   . Morbid obesity (HCC)   . OSA (obstructive sleep apnea)    uses CPAP at home  . Oxygen deficiency 02/28/2017   3 liters  . Peripheral edema   . Scoliosis    Past Surgical History:  Procedure Laterality Date  . HERNIA REPAIR    . scoliosis repair    . TUBAL LIGATION     Family History  Problem Relation Age of Onset  . Diabetes Mother   . Hypertension Mother   . Cancer Maternal Grandmother    Social History   Tobacco Use  . Smoking status: Former Smoker    Packs/day: 0.20    Types: Cigarettes    Last attempt to quit: 02/16/2017    Years since quitting: 0.6  . Smokeless tobacco: Never Used  . Tobacco comment: hasn't smoked in 3-4 months  Substance Use Topics  . Alcohol use: Yes    Alcohol/week: 1.0 - 2.0 standard drinks    Types: 1 - 2 Standard drinks or equivalent per week    Comment: occasionally   No Known Allergies    Review of Systems  Constitutional: Positive for fatigue (better with CPAP). Negative for appetite change.  HENT: Negative for congestion, postnasal drip and sore throat.   Eyes: Negative.   Respiratory: Positive for cough and shortness of breath. Negative for chest tightness and wheezing.   Cardiovascular: Positive for leg swelling. Negative for chest  pain and palpitations.  Gastrointestinal: Positive for nausea. Negative for abdominal distention and abdominal pain.  Endocrine: Negative.   Genitourinary: Negative.   Musculoskeletal: Positive for arthralgias and back pain.  Skin: Negative.   Allergic/Immunologic: Negative.   Neurological: Positive for headaches. Negative for dizziness, weakness and light-headedness.  Hematological: Negative for adenopathy. Does not bruise/bleed easily.  Psychiatric/Behavioral: Positive for dysphoric mood. Negative for sleep disturbance and suicidal ideas. The patient is not nervous/anxious.      Physical Exam  Constitutional: She is oriented to person, place, and time. She appears well-developed and well-nourished.  HENT:  Head: Normocephalic and atraumatic.  Neck: Normal range of motion. Neck supple. No JVD present.  Cardiovascular: Normal rate and regular rhythm.  Pulmonary/Chest: Effort normal. She has no wheezes. She has no rhonchi. She has no rales.  Abdominal: Soft. She exhibits no distension. There is no tenderness.  Musculoskeletal: She exhibits edema (1+ pitting in bilateral lower legs). She exhibits no tenderness.  Neurological: She is alert and oriented to person, place, and time.  Skin: Skin is warm and dry.  Psychiatric: She has a normal mood and affect. Her behavior is normal. Thought content normal.  Nursing note and vitals reviewed.  Assessment & Plan:  1: Chronic heart  failure with preserved ejection fraction- - NYHA class III - minimally fluid overloaded today with pedal edema - weighing daily. Reminded her to call for an overnight weight gain of >2 pounds or a weekly weight gain of >5 pounds.  - weight - not adding salt and has been trying to read food labels more often.  - saw cardiologist (Tracey White) 07/22/17 - BMP from 09/25/17 reviewed and showed sodium 141, potassium 3.3, creatinine 0.72  and GFR >60 - has bariatric appointment at [facility] sometime in November  - BNP 03/24/17  was 17.0 -   2: Obstructive sleep apnea- - had recent sleep study - awaiting on bipap equipment - saw PCP Tracey White) 09/25/17 - says that her oxygen level drops into the 80's upon exertion and that she's worn oxygen in the past. Encouraged her to discuss with PCP tomorrow about whether oxygen needs to be reordered  3: Lymphedema- - stage 1 lymphedema  - has been wearing compression boots daily - says that her edema has improved with taking extra bumex  4: Tobacco use- - no tobacco in the last 2 months - discussed complete cessation for 3 minutes with her  Patient did not bring her medications nor a list. Each medication was verbally reviewed with the patient and she was encouraged to bring the bottles to every visit to confirm accuracy of list.

## 2017-10-28 ENCOUNTER — Ambulatory Visit: Payer: Self-pay | Admitting: Family

## 2017-10-30 ENCOUNTER — Ambulatory Visit: Payer: Self-pay | Admitting: Family

## 2017-10-30 ENCOUNTER — Telehealth: Payer: Self-pay | Admitting: Family

## 2017-10-30 NOTE — Telephone Encounter (Signed)
Patient did not show for her [facility] appointment on 10/30/17. Will attempt to reschedule.  

## 2017-11-02 NOTE — Progress Notes (Signed)
Patient ID: Tracey White, female    DOB: [birth year], 37 y.o.   MRN: [id number]  HPI  Ms Tracey White is a 37 y/o female with a history of anxiety, obstructive sleep apnea (with nasal CPAP), scoliosis, recent tobacco use and chronic heart failure.   Echo done 04/04/17 at [facility] showed an EF of >55%. Reviewed last echo report done 10/19/15 which showed an EF of 65-70% along with trivial TR.   Was in the ED 09/25/17 due to shortness of breath. CXR and labs were negative. Diuretic was increased and she was released.    She presents today for a follow-up visit with a chief complaint of moderate fatigue upon minimal exertion. She describes this as chronic in nature having been present for several years although she feels like it may be a little bit worse since she's has a cold. She has associated cough, shortness of breath, wheezing, headaches, pedal edema and slight weight gain. She denies any abdominal distention, palpitations, chest pain, weakness or dizziness.   Past Medical History:  Diagnosis Date  . Anxiety   . CHF (congestive heart failure) (HCC)   . Chronic heart failure (HCC)   . Morbid obesity (HCC)   . OSA (obstructive sleep apnea)    uses CPAP at home  . Oxygen deficiency 02/28/2017   3 liters  . Peripheral edema   . Scoliosis    Past Surgical History:  Procedure Laterality Date  . HERNIA REPAIR    . scoliosis repair    . TUBAL LIGATION     Family History  Problem Relation Age of Onset  . Diabetes Mother   . Hypertension Mother   . Cancer Maternal Grandmother    Social History   Tobacco Use  . Smoking status: Former Smoker    Packs/day: 0.20    Types: Cigarettes    Last attempt to quit: 02/16/2017    Years since quitting: 0.7  . Smokeless tobacco: Never Used  . Tobacco comment: hasn't smoked in 3-4 months  Substance Use Topics  . Alcohol use: Yes    Alcohol/week: 1.0 - 2.0 standard drinks    Types: 1 - 2 Standard drinks or equivalent per week    Comment: occasionally    No Known Allergies  Prior to Admission medications   Medication Sig Start Date End Date Taking? Authorizing Provider  albuterol (PROVENTIL HFA;VENTOLIN HFA) 108 (90 Base) MCG/ACT inhaler Inhale 2 puffs into the lungs every 4 (four) hours as needed for wheezing or shortness of breath. 03/24/17  Yes Triplett, Cari B, FNP  ARIPiprazole (ABILIFY) 2 MG tablet Take 10 mg by mouth daily.    Yes [provider]  budesonide-formoterol (SYMBICORT) 160-4.5 MCG/ACT inhaler Inhale 2 puffs into the lungs 2 (two) times daily.   Yes [provider]  bumetanide (BUMEX) 2 MG tablet Take 4 mg by mouth 2 (two) times daily.    Yes [provider]  buPROPion (WELLBUTRIN) 75 MG tablet Take 150 mg by mouth daily.    Yes [provider]  diclofenac (VOLTAREN) 75 MG EC tablet Take 1 tablet (75 mg total) by mouth 2 (two) times daily after a meal. 08/07/17  Yes Lateef, Roselie Skinner, MD  diclofenac sodium (VOLTAREN) 1 % GEL Apply 4 (four) times daily topically.   Yes [provider]  diltiazem (CARDIZEM) 120 MG tablet Take 120 mg daily by mouth.   Yes [provider]  escitalopram (LEXAPRO) 20 MG tablet Take 20 mg by mouth daily.  Yes [provider]  ferrous sulfate 325 (65 FE) MG tablet Take 325 mg by mouth daily with breakfast.   Yes [provider]  lidocaine (LIDODERM) 5 % Place 1 patch daily onto the skin. Remove & Discard patch within 12 hours or as directed by MD   Yes [provider]  losartan (COZAAR) 25 MG tablet Take 1 tablet (25 mg total) by mouth daily. 04/14/17  Yes Burle Kwan A, FNP  meloxicam (MOBIC) 15 MG tablet Take 15 mg by mouth daily as needed for pain.   Yes [provider]  montelukast (SINGULAIR) 10 MG tablet Take 10 mg by mouth at bedtime.   Yes [provider]  norgestimate-ethinyl estradiol (SPRINTEC 28) 0.25-35 MG-MCG tablet Take 1 tablet by mouth daily.   Yes [provider]  potassium  chloride SA (K-DUR,KLOR-CON) 20 MEQ tablet Take 40 mEq by mouth 2 (two) times daily.   Yes [provider]  traMADol (ULTRAM) 50 MG tablet Take 1 tablet (50 mg total) by mouth every 8 (eight) hours as needed for severe pain. 09/09/17 12/08/17 Yes Barbette Merino, NP  Vitamin D, Ergocalciferol, (DRISDOL) 50000 units CAPS capsule Take 50,000 Units by mouth every 7 (seven) days.   Yes [provider]   Review of Systems  Constitutional: Positive for fatigue (with minimal exertion). Negative for appetite change.  HENT: Negative for congestion, postnasal drip and sore throat.   Eyes: Negative.   Respiratory: Positive for cough (productive), shortness of breath and wheezing ("little bit"). Negative for chest tightness.   Cardiovascular: Positive for leg swelling. Negative for chest pain and palpitations.  Gastrointestinal: Positive for nausea. Negative for abdominal distention and abdominal pain.  Endocrine: Negative.   Genitourinary: Negative.   Musculoskeletal: Positive for arthralgias and back pain.  Skin: Negative.   Allergic/Immunologic: Negative.   Neurological: Positive for headaches. Negative for dizziness, weakness and light-headedness.  Hematological: Negative for adenopathy. Does not bruise/bleed easily.  Psychiatric/Behavioral: Positive for dysphoric mood. Negative for sleep disturbance and suicidal ideas. The patient is not nervous/anxious.    Vitals:   11/03/17 1555  BP: 122/70  Pulse: (!) 109  Resp: 20  SpO2: (!) 87%  Weight: (!) 368 lb (166.9 kg)  Height: 5\' 1"  (1.549 m)   Wt Readings from Last 3 Encounters:  11/03/17 (!) 368 lb (166.9 kg)  09/30/17 (!) 364 lb 6 oz (165.3 kg)  09/25/17 (!) 367 lb 15.2 oz (166.9 kg)   Lab Results  Component Value Date   CREATININE 0.72 09/25/2017   CREATININE 0.74 03/24/2017   CREATININE 0.76 01/13/2017   Physical Exam  Constitutional: She is oriented to person, place, and time. She appears well-developed and  well-nourished.  HENT:  Head: Normocephalic and atraumatic.  Neck: Normal range of motion. Neck supple. No JVD present.  Cardiovascular: Normal rate and regular rhythm.  Pulmonary/Chest: Effort normal. She has no wheezes. She has no rhonchi. She has no rales.  Abdominal: Soft. She exhibits no distension. There is no tenderness.  Musculoskeletal: She exhibits edema (1+ pitting in bilateral lower legs). She exhibits no tenderness.  Neurological: She is alert and oriented to person, place, and time.  Skin: Skin is warm and dry.  Psychiatric: She has a normal mood and affect. Her behavior is normal. Thought content normal.  Nursing note and vitals reviewed.  Assessment & Plan:  1: Chronic heart failure with preserved ejection fraction- - NYHA class III - minimally fluid overloaded today with pedal edema although stable -  weighing daily. Reminded her to call for an overnight weight gain of >2 pounds or a weekly weight gain of >5 pounds.  - weight up 4 pounds since she was last here 1 month ago - not adding salt and has been trying to read food labels more often.  - saw cardiologist (Wall) 07/22/17 - BMP from 09/25/17 reviewed and showed sodium 141, potassium 3.3, creatinine 0.72  and GFR >60 - has bariatric appointment at [facility] 12/12/17 - BNP 03/24/17 was 17.0  2: Obstructive sleep apnea- - saw PCP Ivory Broad) 10/01/17  3: Lymphedema- - stage 1 lymphedema  - has been wearing compression boots daily - says that her edema has improved  4: Tobacco use- - no tobacco in the last 4 months - discussed complete cessation for 3 minutes with her  Patient did not bring her medications nor a list. Each medication was verbally reviewed with the patient and she was encouraged to bring the bottles to every visit to confirm accuracy of list.  Will not make a return appointment at this time. Advised patient that she could call back at anytime to make another appointment.

## 2017-11-03 ENCOUNTER — Ambulatory Visit: Payer: Medicaid Other | Attending: Family | Admitting: Family

## 2017-11-03 ENCOUNTER — Encounter: Payer: Self-pay | Admitting: Family

## 2017-11-03 VITALS — BP 122/70 | HR 109 | Resp 20 | Ht 61.0 in | Wt 368.0 lb

## 2017-11-03 DIAGNOSIS — Z79899 Other long term (current) drug therapy: Secondary | ICD-10-CM | POA: Insufficient documentation

## 2017-11-03 DIAGNOSIS — G4733 Obstructive sleep apnea (adult) (pediatric): Secondary | ICD-10-CM | POA: Diagnosis not present

## 2017-11-03 DIAGNOSIS — I890 Lymphedema, not elsewhere classified: Secondary | ICD-10-CM | POA: Insufficient documentation

## 2017-11-03 DIAGNOSIS — R609 Edema, unspecified: Secondary | ICD-10-CM | POA: Insufficient documentation

## 2017-11-03 DIAGNOSIS — Z8249 Family history of ischemic heart disease and other diseases of the circulatory system: Secondary | ICD-10-CM | POA: Diagnosis not present

## 2017-11-03 DIAGNOSIS — Z87891 Personal history of nicotine dependence: Secondary | ICD-10-CM | POA: Insufficient documentation

## 2017-11-03 DIAGNOSIS — R5382 Chronic fatigue, unspecified: Secondary | ICD-10-CM | POA: Diagnosis present

## 2017-11-03 DIAGNOSIS — F419 Anxiety disorder, unspecified: Secondary | ICD-10-CM | POA: Diagnosis not present

## 2017-11-03 DIAGNOSIS — Z720 Tobacco use: Secondary | ICD-10-CM

## 2017-11-03 DIAGNOSIS — R0902 Hypoxemia: Secondary | ICD-10-CM | POA: Insufficient documentation

## 2017-11-03 DIAGNOSIS — M419 Scoliosis, unspecified: Secondary | ICD-10-CM | POA: Diagnosis not present

## 2017-11-03 DIAGNOSIS — I5032 Chronic diastolic (congestive) heart failure: Secondary | ICD-10-CM | POA: Insufficient documentation

## 2017-11-03 NOTE — Patient Instructions (Signed)
Continue weighing daily and call for an overnight weight gain of > 2 pounds or a weekly weight gain of >5 pounds. 

## 2017-11-12 ENCOUNTER — Inpatient Hospital Stay: Payer: Medicaid Other

## 2017-11-13 ENCOUNTER — Inpatient Hospital Stay: Payer: Medicaid Other | Attending: Hematology and Oncology

## 2017-11-13 ENCOUNTER — Inpatient Hospital Stay: Payer: Medicaid Other

## 2017-11-13 DIAGNOSIS — D509 Iron deficiency anemia, unspecified: Secondary | ICD-10-CM

## 2017-11-13 DIAGNOSIS — D500 Iron deficiency anemia secondary to blood loss (chronic): Secondary | ICD-10-CM

## 2017-11-13 DIAGNOSIS — E538 Deficiency of other specified B group vitamins: Secondary | ICD-10-CM | POA: Insufficient documentation

## 2017-11-13 LAB — CBC WITH DIFFERENTIAL/PLATELET
Abs Immature Granulocytes: 0.1 10*3/uL — ABNORMAL HIGH (ref 0.00–0.07)
Basophils Absolute: 0 10*3/uL (ref 0.0–0.1)
Basophils Relative: 0 %
Eosinophils Absolute: 0.2 10*3/uL (ref 0.0–0.5)
Eosinophils Relative: 2 %
HCT: 42.5 % (ref 36.0–46.0)
Hemoglobin: 13 g/dL (ref 12.0–15.0)
Immature Granulocytes: 1 %
Lymphocytes Relative: 20 %
Lymphs Abs: 1.9 10*3/uL (ref 0.7–4.0)
MCH: 25.4 pg — ABNORMAL LOW (ref 26.0–34.0)
MCHC: 30.6 g/dL (ref 30.0–36.0)
MCV: 83.2 fL (ref 80.0–100.0)
Monocytes Absolute: 0.5 10*3/uL (ref 0.1–1.0)
Monocytes Relative: 5 %
Neutro Abs: 6.9 10*3/uL (ref 1.7–7.7)
Neutrophils Relative %: 72 %
Platelets: 232 10*3/uL (ref 150–400)
RBC: 5.11 MIL/uL (ref 3.87–5.11)
RDW: 14.6 % (ref 11.5–15.5)
WBC: 9.6 10*3/uL (ref 4.0–10.5)
nRBC: 0 % (ref 0.0–0.2)

## 2017-11-13 LAB — SEDIMENTATION RATE: Sed Rate: 36 mm/hr — ABNORMAL HIGH (ref 0–20)

## 2017-11-13 MED ORDER — CYANOCOBALAMIN 1000 MCG/ML IJ SOLN
1000.0000 ug | Freq: Once | INTRAMUSCULAR | Status: AC
  Administered 2017-11-13: 1000 ug via INTRAMUSCULAR
  Filled 2017-11-13: qty 1

## 2017-11-13 NOTE — Patient Instructions (Signed)
Cyanocobalamin, Vitamin B12 injection What is this medicine? CYANOCOBALAMIN (sye an oh koe BAL a min) is a man made form of vitamin B12. Vitamin B12 is used in the growth of healthy blood cells, nerve cells, and proteins in the body. It also helps with the metabolism of fats and carbohydrates. This medicine is used to treat people who can not absorb vitamin B12. This medicine may be used for other purposes; ask your health care provider or pharmacist if you have questions. COMMON BRAND NAME(S): B-12 Compliance Kit, B-12 Injection Kit, Cyomin, LA-12, Nutri-Twelve, Physicians EZ Use B-12, Primabalt What should I tell my health care provider before I take this medicine? They need to know if you have any of these conditions: -kidney disease -Leber's disease -megaloblastic anemia -an unusual or allergic reaction to cyanocobalamin, cobalt, other medicines, foods, dyes, or preservatives -pregnant or trying to get pregnant -breast-feeding How should I use this medicine? This medicine is injected into a muscle or deeply under the skin. It is usually given by a health care professional in a clinic or doctor's office. However, your doctor may teach you how to inject yourself. Follow all instructions. Talk to your pediatrician regarding the use of this medicine in children. Special care may be needed. Overdosage: If you think you have taken too much of this medicine contact a poison control center or emergency room at once. NOTE: This medicine is only for you. Do not share this medicine with others. What if I miss a dose? If you are given your dose at a clinic or doctor's office, call to reschedule your appointment. If you give your own injections and you miss a dose, take it as soon as you can. If it is almost time for your next dose, take only that dose. Do not take double or extra doses. What may interact with this medicine? -colchicine -heavy alcohol intake This list may not describe all possible  interactions. Give your health care provider a list of all the medicines, herbs, non-prescription drugs, or dietary supplements you use. Also tell them if you smoke, drink alcohol, or use illegal drugs. Some items may interact with your medicine. What should I watch for while using this medicine? Visit your doctor or health care professional regularly. You may need blood work done while you are taking this medicine. You may need to follow a special diet. Talk to your doctor. Limit your alcohol intake and avoid smoking to get the best benefit. What side effects may I notice from receiving this medicine? Side effects that you should report to your doctor or health care professional as soon as possible: -allergic reactions like skin rash, itching or hives, swelling of the face, lips, or tongue -blue tint to skin -chest tightness, pain -difficulty breathing, wheezing -dizziness -red, swollen painful area on the leg Side effects that usually do not require medical attention (report to your doctor or health care professional if they continue or are bothersome): -diarrhea -headache This list may not describe all possible side effects. Call your doctor for medical advice about side effects. You may report side effects to FDA at 1-800-FDA-1088. Where should I keep my medicine? Keep out of the reach of children. Store at room temperature between 15 and 30 degrees C (59 and 85 degrees F). Protect from light. Throw away any unused medicine after the expiration date. NOTE: This sheet is a summary. It may not cover all possible information. If you have questions about this medicine, talk to your doctor, pharmacist, or   health care provider.  2018 Elsevier/Gold Standard (2007-04-27 22:10:20)  

## 2017-11-26 ENCOUNTER — Encounter: Payer: Self-pay | Admitting: Obstetrics and Gynecology

## 2017-12-01 NOTE — Progress Notes (Deleted)
Patient's Name: Tracey White  MRN: [id number]  Referring Provider: Zeb Comfort, MD  DOB: February 23, 1980  PCP: Zeb Comfort, MD  DOS: 12/02/2017  Note by: Vevelyn Francois NP  Service setting: Ambulatory outpatient  Specialty: Interventional Pain Management  Location: [facility] (AMB) Pain Management Facility    Patient type: Established    Primary Reason(s) for Visit: Encounter for prescription drug management. (Level of risk: moderate)  CC: No chief complaint on file.  HPI  Tracey White is a 37 y.o. year old, female patient, who comes today for a medication management evaluation. She has Chest pain at rest; Chronic diastolic heart failure ([place]); Sinus tachycardia; Obstructive sleep apnea; Tobacco use; Chest pain; Hypokalemia; Depression; Chronic low back pain (Primary Area of Pain) (Bilateral) (L>R); Skin lesion of left leg; Lymphedema; Chronic knee pain (Secondary Area of Pain) (Bilateral) (R>L); Chronic thoracic back pain (Fourth Area of Pain) (Bilateral) (R>L); Chronic pain syndrome; Disorder of skeletal system; Other long term (current) drug therapy; Scoliosis; Asthma without status asthmaticus; Anemia; Acute diastolic heart failure ([place]); Neurogenic pain; Failed back surgical syndrome; Pharmacologic therapy; Problems influencing health status; Chronic lower extremity pain (Tertiary Area of Pain) (Bilateral) (R>L); Elevated C-reactive protein (CRP); Elevated sed rate; Vitamin D deficiency; Class 3 obesity with alveolar hypoventilation, serious comorbidity, and body mass index (BMI) of 60.0 to 69.9 in adult [facility]); ANA positive; Positive antinuclear antibody; Iron deficiency anemia; B12 deficiency; Goals of care, counseling/discussion; AIDS ([place]); Long term current use of opiate analgesic; Hypertensive heart disease; and Chronic diastolic congestive heart failure ([place]) on their problem list. Her primarily concern today is the No chief complaint on file.  Pain Assessment: Location:     Radiating:    Onset:   Duration:   Quality:   Severity:  /10 (subjective, self-reported pain score)  Note: Reported level is compatible with observation.                         When using our objective Pain Scale, levels between 6 and 10/10 are said to belong in an emergency room, as it progressively worsens from a 6/10, described as severely limiting, requiring emergency care not usually available at an outpatient pain management facility. At a 6/10 level, communication becomes difficult and requires great effort. Assistance to reach the emergency department may be required. Facial flushing and profuse sweating along with potentially dangerous increases in heart rate and blood pressure will be evident. Effect on ADL:   Timing:   Modifying factors:   BP:    HR:    Tracey White was last scheduled for an appointment on 09/02/2017 for medication management. During today's appointment we reviewed Tracey White's chronic pain status, as well as her outpatient medication regimen.  The patient  reports that she does not use drugs. Her body mass index is unknown because there is no height or weight on file.  Further details on both, my assessment(s), as well as the proposed treatment plan, please see below.  Controlled Substance Pharmacotherapy Assessment REMS (Risk Evaluation and Mitigation Strategy)  Analgesic: *** MME/day: *** mg/day.  No notes on file Pharmacokinetics: Liberation and absorption (onset of action): WNL Distribution (time to peak effect): WNL Metabolism and excretion (duration of action): WNL         Pharmacodynamics: Desired effects: Analgesia: Tracey White reports >50% benefit. Functional ability: Patient reports that medication allows her to accomplish basic ADLs Clinically meaningful improvement in function (CMIF): Sustained CMIF goals met Perceived effectiveness:  Described as relatively effective, allowing for increase in activities of daily living (ADL) Undesirable effects: Side-effects or  Adverse reactions: None reported Monitoring: [place] PMP: Online review of the past 31-monthperiod conducted. Compliant with practice rules and regulations Last UDS on record: Summary  Date Value Ref Range Status  06/02/2017 FINAL  Final    Comment:    ==================================================================== TOXASSURE SELECT 13 (MW) ==================================================================== Test                             Result       Flag       Units Drug Present and Declared for Prescription Verification   Tramadol                       9744         EXPECTED   ng/mg creat   O-Desmethyltramadol            2873         EXPECTED   ng/mg creat   N-Desmethyltramadol            1468         EXPECTED   ng/mg creat    Source of tramadol is a prescription medication.    O-desmethyltramadol and N-desmethyltramadol are expected    metabolites of tramadol. ==================================================================== Test                      Result    Flag   Units      Ref Range   Creatinine              41               mg/dL      >=20 ==================================================================== Declared Medications:  The flagging and interpretation on this report are based on the  following declared medications.  Unexpected results may arise from  inaccuracies in the declared medications.  **Note: The testing scope of this panel includes these medications:  Tramadol  **Note: The testing scope of this panel does not include following  reported medications:  Albuterol  Aripiprazole  Budenoside (Symbicort)  Bumetanide (Bumex)  Bupropion (Wellbutrin)  Diclofenac (Voltaren)  Diltiazem (Cardizem)  Escitalopram (Lexapro)  Estradiol (Ortho Tri-Cyclen)  Formoterol (Symbicort)  Iron (Ferrous Sulfate)  Lidocaine  Losartan  Meloxicam (Mobic)  Metoprolol (Toprol)  Montelukast (Singulair)  Norgestimate (Ortho Tri-Cyclen)  Potassium (K-Dur)  Vitamin D2  (Drisdol) ==================================================================== For clinical consultation, please call [phone] ====================================================================    UDS interpretation: Compliant          Medication Assessment Form: Reviewed. Patient indicates being compliant with therapy Treatment compliance: Compliant Risk Assessment Profile: Aberrant behavior: See prior evaluations. None observed or detected today Comorbid factors increasing risk of overdose: See prior notes. No additional risks detected today Opioid risk tool (ORT) (Total Score):   Personal History of Substance Abuse (SUD-Substance use disorder):  Alcohol:    Illegal Drugs:    Rx Drugs:    ORT Risk Level calculation:   Risk of substance use disorder (SUD): Low  ORT Scoring interpretation table:  Score <3 = Low Risk for SUD  Score between 4-7 = Moderate Risk for SUD  Score >8 = High Risk for Opioid Abuse   Risk Mitigation Strategies:  Patient Counseling: Covered Patient-Prescriber Agreement (PPA): Present and active  Notification to other healthcare providers: Done  Pharmacologic Plan: No change in therapy, at  this time.             Laboratory Chemistry  Inflammation Markers (CRP: Acute Phase) (ESR: Chronic Phase) Lab Results  Component Value Date   CRP 58.7 (H) 12/12/2016   ESRSEDRATE 36 (H) 11/13/2017                         Rheumatology Markers Lab Results  Component Value Date   RF <10.0 01/08/2017   ANA Positive (A) 01/08/2017                        Renal Function Markers Lab Results  Component Value Date   BUN 9 09/25/2017   CREATININE 0.72 09/25/2017   BCR 15 12/12/2016   GFRAA >60 09/25/2017   GFRNONAA >60 09/25/2017                             Hepatic Function Markers Lab Results  Component Value Date   AST 15 03/24/2017   ALT 11 (L) 03/24/2017   ALBUMIN 3.5 03/24/2017   ALKPHOS 110 03/24/2017                         Electrolytes Lab Results  Component Value Date   NA 141 09/25/2017   K 3.3 (L) 09/25/2017   CL 101 09/25/2017   CALCIUM 8.9 09/25/2017   MG 2.1 12/12/2016                        Neuropathy Markers Lab Results  Component Value Date   VITAMINB12 210 03/19/2017   FOLATE 8.1 06/25/2017   HGBA1C 6.1 (H) 05/31/2016   HIV Non Reactive 12/25/2016                        CNS Tests No results found for: COLORCSF, APPEARCSF, RBCCOUNTCSF, WBCCSF, POLYSCSF, LYMPHSCSF, EOSCSF, PROTEINCSF, GLUCCSF, JCVIRUS, CSFOLI, IGGCSF                      Bone Pathology Markers Lab Results  Component Value Date   25OHVITD1 9.1 (L) 12/12/2016   25OHVITD2 3.6 12/12/2016   25OHVITD3 5.5 12/12/2016                         Coagulation Parameters Lab Results  Component Value Date   PLT 232 11/13/2017   DDIMER <0.27 12/24/2016                        Cardiovascular Markers Lab Results  Component Value Date   BNP 17.0 03/24/2017   TROPONINI <0.03 09/25/2017   HGB 13.0 11/13/2017   HCT 42.5 11/13/2017                         CA Markers No results found for: CEA, CA125, LABCA2                      Note: Lab results reviewed.  Recent Diagnostic Imaging Results  DG Chest 2 View CLINICAL DATA:  Shortness of breath.  EXAM: CHEST - 2 VIEW  COMPARISON:  Radiographs of March 24, 2017.  FINDINGS: The heart size and mediastinal contours are within normal limits. Both lungs are clear. No pneumothorax or pleural effusion  is noted. Stable position of Harrington rod for dextroscoliosis.  IMPRESSION: No active cardiopulmonary disease.  Electronically Signed   By: Marijo Conception, M.D.   On: 09/25/2017 17:21  Complexity Note: Imaging results reviewed. Results shared with Tracey White, using Layman's terms.                         Meds   Current Outpatient Medications:  .  albuterol (PROVENTIL HFA;VENTOLIN HFA) 108 (90 Base) MCG/ACT inhaler, Inhale 2 puffs into the lungs every 4 (four)  hours as needed for wheezing or shortness of breath., Disp: 1 Inhaler, Rfl: 1 .  ARIPiprazole (ABILIFY) 2 MG tablet, Take 10 mg by mouth daily. , Disp: , Rfl:  .  ARIPiprazole (ABILIFY) 5 MG tablet, Take 5 mg by mouth at bedtime., Disp: , Rfl: 0 .  azithromycin (ZITHROMAX) 250 MG tablet, TAKE 2 TABLETS BY MOUTH TODAY, THEN TAKE 1 TABLET DAILY FOR 4 DAYS, Disp: , Rfl: 0 .  budesonide-formoterol (SYMBICORT) 160-4.5 MCG/ACT inhaler, Inhale 2 puffs into the lungs 2 (two) times daily., Disp: , Rfl:  .  bumetanide (BUMEX) 2 MG tablet, Take 4 mg by mouth 2 (two) times daily. , Disp: , Rfl:  .  buPROPion (WELLBUTRIN) 75 MG tablet, Take 150 mg by mouth daily. , Disp: , Rfl:  .  diclofenac (VOLTAREN) 75 MG EC tablet, Take 1 tablet (75 mg total) by mouth 2 (two) times daily after a meal., Disp: 60 tablet, Rfl: 1 .  diclofenac sodium (VOLTAREN) 1 % GEL, Apply 4 (four) times daily topically., Disp: , Rfl:  .  diltiazem (CARDIZEM) 120 MG tablet, Take 120 mg daily by mouth., Disp: , Rfl:  .  escitalopram (LEXAPRO) 20 MG tablet, Take 20 mg by mouth daily. , Disp: , Rfl:  .  ferrous sulfate 325 (65 FE) MG tablet, Take 325 mg by mouth daily with breakfast., Disp: , Rfl:  .  lidocaine (LIDODERM) 5 %, Place 1 patch daily onto the skin. Remove & Discard patch within 12 hours or as directed by MD, Disp: , Rfl:  .  losartan (COZAAR) 25 MG tablet, Take 1 tablet (25 mg total) by mouth daily., Disp: 90 tablet, Rfl: 3 .  meloxicam (MOBIC) 15 MG tablet, Take 15 mg by mouth daily as needed for pain., Disp: , Rfl:  .  montelukast (SINGULAIR) 10 MG tablet, Take 10 mg by mouth at bedtime., Disp: , Rfl:  .  norgestimate-ethinyl estradiol (SPRINTEC 28) 0.25-35 MG-MCG tablet, Take 1 tablet by mouth daily., Disp: , Rfl:  .  potassium chloride SA (K-DUR,KLOR-CON) 20 MEQ tablet, Take 40 mEq by mouth 2 (two) times daily., Disp: , Rfl:  .  predniSONE (DELTASONE) 10 MG tablet, TAKE 6 TABLETS (60 MG TOTAL) BY MOUTH ONCE DAILY, Disp: , Rfl:  0 .  traMADol (ULTRAM) 50 MG tablet, Take 1 tablet (50 mg total) by mouth every 8 (eight) hours as needed for severe pain., Disp: 90 tablet, Rfl: 2 .  Vitamin D, Ergocalciferol, (DRISDOL) 50000 units CAPS capsule, Take 50,000 Units by mouth every 7 (seven) days., Disp: , Rfl:  No current facility-administered medications for this visit.   Facility-Administered Medications Ordered in Other Visits:  .  technetium tetrofosmin (TC-MYOVIEW) injection 21.30 millicurie, 86.57 millicurie, Intravenous, Once PRN, [place], David A, MD  ROS  Constitutional: Denies any fever or chills Gastrointestinal: No reported hemesis, hematochezia, vomiting, or acute GI distress Musculoskeletal: Denies any acute onset joint swelling, redness, loss of  ROM, or weakness Neurological: No reported episodes of acute onset apraxia, aphasia, dysarthria, agnosia, amnesia, paralysis, loss of coordination, or loss of consciousness  Allergies  Ms. Demeter has No Known Allergies.  [place]  Drug: Tracey White  reports that she does not use drugs. Alcohol:  reports that she drinks about 1.0 - 2.0 standard drinks of alcohol per week. Tobacco:  reports that she quit smoking about 9 months ago. Her smoking use included cigarettes. She smoked 0.20 packs per day. She has never used smokeless tobacco. Medical:  has a past medical history of Anxiety, CHF (congestive heart failure) ([place]), Chronic heart failure ([place]), Morbid obesity ([place]), OSA (obstructive sleep apnea), Oxygen deficiency (02/28/2017), Peripheral edema, and Scoliosis. Surgical: Tracey White  has a past surgical history that includes Tubal ligation; Hernia repair; and scoliosis repair. Family: family history includes Cancer in her maternal grandmother; Diabetes in her mother; Hypertension in her mother.  Constitutional Exam  General appearance: Well nourished, well developed, and well hydrated. In no apparent acute distress There were no vitals filed for this visit. BMI Assessment:  Estimated body mass index is 69.53 kg/m as calculated from the following:   Height as of 11/03/17: 5' 1" (1.549 m).   Weight as of 11/03/17: 368 lb (166.9 kg).  BMI interpretation table: BMI level Category Range association with higher incidence of chronic pain  <18 kg/m2 Underweight   18.5-24.9 kg/m2 Ideal body weight   25-29.9 kg/m2 Overweight Increased incidence by 20%  30-34.9 kg/m2 Obese (Class I) Increased incidence by 68%  35-39.9 kg/m2 Severe obesity (Class II) Increased incidence by 136%  >40 kg/m2 Extreme obesity (Class III) Increased incidence by 254%   Patient's current BMI Ideal Body weight  There is no height or weight on file to calculate BMI. Patient weight not recorded   BMI Readings from Last 4 Encounters:  11/03/17 69.53 kg/m  09/30/17 68.85 kg/m  09/25/17 69.52 kg/m  09/02/17 69.53 kg/m   Wt Readings from Last 4 Encounters:  11/03/17 (!) 368 lb (166.9 kg)  09/30/17 (!) 364 lb 6 oz (165.3 kg)  09/25/17 (!) 367 lb 15.2 oz (166.9 kg)  09/02/17 (!) 368 lb (166.9 kg)  Psych/Mental status: Alert, oriented x 3 (person, place, & time)       Eyes: PERLA Respiratory: No evidence of acute respiratory distress  Cervical Spine Area Exam  Skin & Axial Inspection: No masses, redness, edema, swelling, or associated skin lesions Alignment: Symmetrical Functional ROM: Unrestricted ROM      Stability: No instability detected Muscle Tone/Strength: Functionally intact. No obvious neuro-muscular anomalies detected. Sensory (Neurological): Unimpaired Palpation: No palpable anomalies              Upper Extremity (UE) Exam    Side: Right upper extremity  Side: Left upper extremity  Skin & Extremity Inspection: Skin color, temperature, and hair growth are WNL. No peripheral edema or cyanosis. No masses, redness, swelling, asymmetry, or associated skin lesions. No contractures.  Skin & Extremity Inspection: Skin color, temperature, and hair growth are WNL. No peripheral edema or  cyanosis. No masses, redness, swelling, asymmetry, or associated skin lesions. No contractures.  Functional ROM: Unrestricted ROM          Functional ROM: Unrestricted ROM          Muscle Tone/Strength: Functionally intact. No obvious neuro-muscular anomalies detected.  Muscle Tone/Strength: Functionally intact. No obvious neuro-muscular anomalies detected.  Sensory (Neurological): Unimpaired          Sensory (Neurological): Unimpaired  Palpation: No palpable anomalies              Palpation: No palpable anomalies              Provocative Test(s):  Phalen's test: deferred Tinel's test: deferred Apley's scratch test (touch opposite shoulder):  Action 1 (Across chest): deferred Action 2 (Overhead): deferred Action 3 (LB reach): deferred   Provocative Test(s):  Phalen's test: deferred Tinel's test: deferred Apley's scratch test (touch opposite shoulder):  Action 1 (Across chest): deferred Action 2 (Overhead): deferred Action 3 (LB reach): deferred    Thoracic Spine Area Exam  Skin & Axial Inspection: No masses, redness, or swelling Alignment: Symmetrical Functional ROM: Unrestricted ROM Stability: No instability detected Muscle Tone/Strength: Functionally intact. No obvious neuro-muscular anomalies detected. Sensory (Neurological): Unimpaired Muscle strength & Tone: No palpable anomalies  Lumbar Spine Area Exam  Skin & Axial Inspection: No masses, redness, or swelling Alignment: Symmetrical Functional ROM: Unrestricted ROM       Stability: No instability detected Muscle Tone/Strength: Functionally intact. No obvious neuro-muscular anomalies detected. Sensory (Neurological): Unimpaired Palpation: No palpable anomalies       Provocative Tests: Hyperextension/rotation test: deferred today       Lumbar quadrant test (Kemp's test): deferred today       Lateral bending test: deferred today       Patrick's Maneuver: deferred today                   FABER test: deferred today                    S-I anterior distraction/compression test: deferred today         S-I lateral compression test: deferred today         S-I Thigh-thrust test: deferred today         S-I Gaenslen's test: deferred today          Gait & Posture Assessment  Ambulation: Unassisted Gait: Relatively normal for age and body habitus Posture: WNL   Lower Extremity Exam    Side: Right lower extremity  Side: Left lower extremity  Stability: No instability observed          Stability: No instability observed          Skin & Extremity Inspection: Skin color, temperature, and hair growth are WNL. No peripheral edema or cyanosis. No masses, redness, swelling, asymmetry, or associated skin lesions. No contractures.  Skin & Extremity Inspection: Skin color, temperature, and hair growth are WNL. No peripheral edema or cyanosis. No masses, redness, swelling, asymmetry, or associated skin lesions. No contractures.  Functional ROM: Unrestricted ROM                  Functional ROM: Unrestricted ROM                  Muscle Tone/Strength: Functionally intact. No obvious neuro-muscular anomalies detected.  Muscle Tone/Strength: Functionally intact. No obvious neuro-muscular anomalies detected.  Sensory (Neurological): Unimpaired  Sensory (Neurological): Unimpaired  Palpation: No palpable anomalies  Palpation: No palpable anomalies   Assessment  Primary Diagnosis & Pertinent Problem List: There were no encounter diagnoses.  Status Diagnosis  Controlled Controlled Controlled No diagnosis found.  Problems updated and reviewed during this visit: No problems updated. Plan of Care  Pharmacotherapy (Medications Ordered): No orders of the defined types were placed in this encounter.  New Prescriptions   No medications on file   Medications administered today: Tracey White  had no medications administered during this visit. Lab-work, procedure(s), and/or referral(s): No orders of the defined types were placed  in this encounter.  Imaging and/or referral(s): None  Planned, scheduled, and/or pending: Not at this time.  Encourage patient to do with nutrition i.e. portion size, actual foods (low carbs) increase water intake and meal timing.   Considering: Diagnostic bilateral LESI Diagnostic bilateral lumbar facet nerve block Possible bilateral lumbar facet RFA Bilateral Hyalgan series Diagnostic bilateral intra-articular knee injection Diagnosticbilateral genicular nerve block Possible bilateral genicular RFA   PRN Procedures: None at this time    Provider-requested follow-up: No follow-ups on file.  Future Appointments  Date Time Provider [place]  12/02/2017  8:30 AM Vevelyn Francois, NP [facility] None  12/10/2017  8:30 AM [facility] INJECTION [facility] None  01/07/2018  8:30 AM [facility] INJECTION [facility] None  02/03/2018  4:00 PM [facility] LAB [facility] None  02/04/2018  3:15 PM Lequita Asal, MD [facility] None  02/04/2018  3:30 PM [facility] INFUSION CHAIR 3 [facility] None   Primary Care Physician: Zeb Comfort, MD Location[facility] Out[facility] Note by: Vevelyn Francois NP Date: 12/02/2017; Time: 1:55 PM  Pain Score Disclaimer: We use the NRS-11 scale. This is a self-reported, subjective measurement of pain severity with only modest accuracy. It is used primarily to identify changes within a particular patient. It must be understood that outpatient pain scales are significantly less accurate that those used for research, where they can be applied under ideal controlled circumstances with minimal exposure to variables. In reality, the score is likely to be a combination of pain intensity and pain affect, where pain affect describes the degree of emotional arousal or changes in action readiness caused by the sensory experience of pain. Factors such as social and work situation, setting, emotional state, anxiety levels, expectation, and prior pain  experience may influence pain perception and show large inter-individual differences that may also be affected by time variables.  Patient instructions provided during this appointment: There are no Patient Instructions on file for this visit.

## 2017-12-02 ENCOUNTER — Encounter: Payer: Self-pay | Admitting: Nurse Practitioner

## 2017-12-10 ENCOUNTER — Inpatient Hospital Stay: Payer: Medicaid Other | Attending: Hematology and Oncology

## 2017-12-10 VITALS — BP 126/97 | HR 105 | Temp 97.1°F | Resp 18

## 2017-12-10 DIAGNOSIS — D509 Iron deficiency anemia, unspecified: Secondary | ICD-10-CM

## 2017-12-10 DIAGNOSIS — E538 Deficiency of other specified B group vitamins: Secondary | ICD-10-CM | POA: Diagnosis not present

## 2017-12-10 MED ORDER — CYANOCOBALAMIN 1000 MCG/ML IJ SOLN
1000.0000 ug | Freq: Once | INTRAMUSCULAR | Status: AC
  Administered 2017-12-10: 1000 ug via INTRAMUSCULAR
  Filled 2017-12-10: qty 1

## 2017-12-10 NOTE — Patient Instructions (Signed)
Cyanocobalamin, Vitamin B12 injection What is this medicine? CYANOCOBALAMIN (sye an oh koe BAL a min) is a man made form of vitamin B12. Vitamin B12 is used in the growth of healthy blood cells, nerve cells, and proteins in the body. It also helps with the metabolism of fats and carbohydrates. This medicine is used to treat people who can not absorb vitamin B12. This medicine may be used for other purposes; ask your health care provider or pharmacist if you have questions. COMMON BRAND NAME(S): B-12 Compliance Kit, B-12 Injection Kit, Cyomin, LA-12, Nutri-Twelve, Physicians EZ Use B-12, Primabalt What should I tell my health care provider before I take this medicine? They need to know if you have any of these conditions: -kidney disease -Leber's disease -megaloblastic anemia -an unusual or allergic reaction to cyanocobalamin, cobalt, other medicines, foods, dyes, or preservatives -pregnant or trying to get pregnant -breast-feeding How should I use this medicine? This medicine is injected into a muscle or deeply under the skin. It is usually given by a health care professional in a clinic or doctor's office. However, your doctor may teach you how to inject yourself. Follow all instructions. Talk to your pediatrician regarding the use of this medicine in children. Special care may be needed. Overdosage: If you think you have taken too much of this medicine contact a poison control center or emergency room at once. NOTE: This medicine is only for you. Do not share this medicine with others. What if I miss a dose? If you are given your dose at a clinic or doctor's office, call to reschedule your appointment. If you give your own injections and you miss a dose, take it as soon as you can. If it is almost time for your next dose, take only that dose. Do not take double or extra doses. What may interact with this medicine? -colchicine -heavy alcohol intake This list may not describe all possible  interactions. Give your health care provider a list of all the medicines, herbs, non-prescription drugs, or dietary supplements you use. Also tell them if you smoke, drink alcohol, or use illegal drugs. Some items may interact with your medicine. What should I watch for while using this medicine? Visit your doctor or health care professional regularly. You may need blood work done while you are taking this medicine. You may need to follow a special diet. Talk to your doctor. Limit your alcohol intake and avoid smoking to get the best benefit. What side effects may I notice from receiving this medicine? Side effects that you should report to your doctor or health care professional as soon as possible: -allergic reactions like skin rash, itching or hives, swelling of the face, lips, or tongue -blue tint to skin -chest tightness, pain -difficulty breathing, wheezing -dizziness -red, swollen painful area on the leg Side effects that usually do not require medical attention (report to your doctor or health care professional if they continue or are bothersome): -diarrhea -headache This list may not describe all possible side effects. Call your doctor for medical advice about side effects. You may report side effects to FDA at 1-800-FDA-1088. Where should I keep my medicine? Keep out of the reach of children. Store at room temperature between 15 and 30 degrees C (59 and 85 degrees F). Protect from light. Throw away any unused medicine after the expiration date. NOTE: This sheet is a summary. It may not cover all possible information. If you have questions about this medicine, talk to your doctor, pharmacist, or  health care provider.  2018 Elsevier/Gold Standard (2007-04-27 22:10:20)  

## 2018-01-07 ENCOUNTER — Inpatient Hospital Stay: Payer: Medicaid Other | Attending: Hematology and Oncology

## 2018-02-03 ENCOUNTER — Other Ambulatory Visit: Payer: Self-pay

## 2018-02-03 ENCOUNTER — Inpatient Hospital Stay: Payer: Medicare Other | Attending: Adult Health

## 2018-02-03 DIAGNOSIS — E538 Deficiency of other specified B group vitamins: Secondary | ICD-10-CM | POA: Insufficient documentation

## 2018-02-03 DIAGNOSIS — D500 Iron deficiency anemia secondary to blood loss (chronic): Secondary | ICD-10-CM

## 2018-02-03 DIAGNOSIS — D509 Iron deficiency anemia, unspecified: Secondary | ICD-10-CM | POA: Diagnosis present

## 2018-02-03 LAB — CBC WITH DIFFERENTIAL/PLATELET
Abs Immature Granulocytes: 0.07 10*3/uL (ref 0.00–0.07)
Basophils Absolute: 0 10*3/uL (ref 0.0–0.1)
Basophils Relative: 0 %
Eosinophils Absolute: 0.2 10*3/uL (ref 0.0–0.5)
Eosinophils Relative: 2 %
HCT: 40.1 % (ref 36.0–46.0)
Hemoglobin: 12.2 g/dL (ref 12.0–15.0)
Immature Granulocytes: 1 %
Lymphocytes Relative: 22 %
Lymphs Abs: 2.2 10*3/uL (ref 0.7–4.0)
MCH: 25.4 pg — ABNORMAL LOW (ref 26.0–34.0)
MCHC: 30.4 g/dL (ref 30.0–36.0)
MCV: 83.4 fL (ref 80.0–100.0)
Monocytes Absolute: 0.5 10*3/uL (ref 0.1–1.0)
Monocytes Relative: 5 %
Neutro Abs: 7.1 10*3/uL (ref 1.7–7.7)
Neutrophils Relative %: 70 %
Platelets: 197 10*3/uL (ref 150–400)
RBC: 4.81 MIL/uL (ref 3.87–5.11)
RDW: 16.3 % — ABNORMAL HIGH (ref 11.5–15.5)
WBC: 10.2 10*3/uL (ref 4.0–10.5)
nRBC: 0 % (ref 0.0–0.2)

## 2018-02-04 ENCOUNTER — Ambulatory Visit: Payer: Self-pay

## 2018-02-04 ENCOUNTER — Ambulatory Visit: Payer: Self-pay | Admitting: Hematology and Oncology

## 2018-02-04 LAB — FERRITIN: Ferritin: 65 ng/mL (ref 11–307)

## 2018-02-04 NOTE — Progress Notes (Deleted)
[facility] day:  02/04/2018  Chief Complaint: Tracey White is a 38 y.o. female with iron deficiency anemia and B12 deficiency who is seen for 6 month assessment.  HPI:  The patient was last seen in the hematology clinic on 08/20/2017.  At that time, she continued to be fatigued secondary to CHF.  Menses was now light.  Exam was stable.  Hemoglobin was normal.  Ferritin was 79.  CBC on 11/13/2017 revealed a hematocrit of 42.5, hemoglobin 13.0, and MCV 83.2.  Sed rate was 36.  CBC on 02/03/2018 revealed a hematocrit of 40.1, hemoglobin 12.2, and MCV 83.4.  Ferritin was x.  During the interim,    Past Medical History:  Diagnosis Date  . Anxiety   . CHF (congestive heart failure) ([place])   . Chronic heart failure ([place])   . Morbid obesity ([place])   . OSA (obstructive sleep apnea)    uses CPAP at home  . Oxygen deficiency 02/28/2017   3 liters  . Peripheral edema   . Scoliosis     Past Surgical History:  Procedure Laterality Date  . HERNIA REPAIR    . scoliosis repair    . TUBAL LIGATION      Family History  Problem Relation Age of Onset  . Diabetes Mother   . Hypertension Mother   . Cancer Maternal Grandmother     Social History:  reports that she quit smoking about a year ago. Her smoking use included cigarettes. She smoked 0.20 packs per day. She has never used smokeless tobacco. She reports current alcohol use of about 1.0 - 2.0 standard drinks of alcohol per week. She reports that she does not use drugs.  She quit smoking 1 month ago.  She smoked < 1 pack/day since age 48.  She denies any exposure to radiations or toxins.  She lives in [place].  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  The patient is alone today.  Allergies: No Known Allergies  Current Medications: Current Outpatient Medications  Medication Sig Dispense Refill  . albuterol (PROVENTIL HFA;VENTOLIN HFA) 108 (90 Base) MCG/ACT inhaler  Inhale 2 puffs into the lungs every 4 (four) hours as needed for wheezing or shortness of breath. 1 Inhaler 1  . ARIPiprazole (ABILIFY) 2 MG tablet Take 10 mg by mouth daily.     . ARIPiprazole (ABILIFY) 5 MG tablet Take 5 mg by mouth at bedtime.  0  . azithromycin (ZITHROMAX) 250 MG tablet TAKE 2 TABLETS BY MOUTH TODAY, THEN TAKE 1 TABLET DAILY FOR 4 DAYS  0  . budesonide-formoterol (SYMBICORT) 160-4.5 MCG/ACT inhaler Inhale 2 puffs into the lungs 2 (two) times daily.    . bumetanide (BUMEX) 2 MG tablet Take 4 mg by mouth 2 (two) times daily.     Marland Kitchen buPROPion (WELLBUTRIN) 75 MG tablet Take 150 mg by mouth daily.     . diclofenac (VOLTAREN) 75 MG EC tablet Take 1 tablet (75 mg total) by mouth 2 (two) times daily after a meal. 60 tablet 1  . diclofenac sodium (VOLTAREN) 1 % GEL Apply 4 (four) times daily topically.    Marland Kitchen diltiazem (CARDIZEM) 120 MG tablet Take 120 mg daily by mouth.    . escitalopram (LEXAPRO) 20 MG tablet Take 20 mg by mouth daily.     . ferrous sulfate 325 (65 FE) MG tablet Take 325 mg by mouth daily with breakfast.    .  lidocaine (LIDODERM) 5 % Place 1 patch daily onto the skin. Remove & Discard patch within 12 hours or as directed by MD    . losartan (COZAAR) 25 MG tablet Take 1 tablet (25 mg total) by mouth daily. 90 tablet 3  . meloxicam (MOBIC) 15 MG tablet Take 15 mg by mouth daily as needed for pain.    . montelukast (SINGULAIR) 10 MG tablet Take 10 mg by mouth at bedtime.    . norgestimate-ethinyl estradiol (SPRINTEC 28) 0.25-35 MG-MCG tablet Take 1 tablet by mouth daily.    . potassium chloride SA (K-DUR,KLOR-CON) 20 MEQ tablet Take 40 mEq by mouth 2 (two) times daily.    . predniSONE (DELTASONE) 10 MG tablet TAKE 6 TABLETS (60 MG TOTAL) BY MOUTH ONCE DAILY  0  . traMADol (ULTRAM) 50 MG tablet Take 1 tablet (50 mg total) by mouth every 8 (eight) hours as needed for severe pain. 90 tablet 2  . Vitamin D, Ergocalciferol, (DRISDOL) 50000 units CAPS capsule Take 50,000 Units  by mouth every 7 (seven) days.     No current facility-administered medications for this visit.    Facility-Administered Medications Ordered in Other Visits  Medication Dose Route Frequency Provider Last Rate Last Dose  . technetium tetrofosmin (TC-MYOVIEW) injection 83.41 millicurie  96.22 millicurie Intravenous Once PRN Martin[place], David A, MD        Review of Systems:  GENERAL:  Fatigue.  No fevers, sweats.  Weight up 5 pounds. PERFORMANCE STATUS (ECOG):  1 HEENT:  No visual changes, runny nose, sore throat, mouth sores or tenderness. Lungs: Shortness of breath with exertion.  No cough.  No hemoptysis. Cardiac:  CHF.  Orthopnea (sleep in a recliner).  No chest pain, palpitations, or PND. GI:  No nausea, vomiting, diarrhea, constipation, melena or hematochezia. GU:  No urgency, frequency, dysuria, or hematuria.  Menses improved. Musculoskeletal:  Back pain.  No joint pain.  No muscle tenderness. Extremities:  Pain, swelling, stiffness in hands. Skin:  No rashes or skin changes. Neuro:  Numbness and tingling with prolonged sitting.  No headache, weakness, balance or coordination issues. Endocrine:  No diabetes, thyroid issues, hot flashes or night sweats. Psych:  No mood changes, depression or anxiety. Pain:  8 out of 10 back pain. Review of systems:  All other systems reviewed and found to be negative.   Physical Exam: .There were no vitals taken for this visit. GENERAL:  Well developed, well nourished, heayset woman sitting comfortably in the exam room in no acute distress. MENTAL STATUS:  Alert and oriented to person, place and time. HEAD:  Dark hair.  Normocephalic, atraumatic, face symmetric, no Cushingoid features. EYES:  Brown eyes.  Pupils equal round and reactive to light and accomodation.  No conjunctivitis or scleral icterus. ENT:  Oropharynx clear without lesion.  Tongue normal. Mucous membranes moist.  RESPIRATORY:  Clear to auscultation without rales, wheezes or  rhonchi. CARDIOVASCULAR:  Regular rate and rhythm without murmur, rub or gallop. ABDOMEN:  Fully round.  Soft, non-tender, with active bowel sounds, and no appreciable hepatosplenomegaly.  No masses. SKIN:  No rashes, ulcers or lesions. EXTREMITIES: Large legs.  No skin discoloration or tenderness.  No palpable cords. LYMPH NODES: No palpable cervical, supraclavicular, axillary or inguinal adenopathy  NEUROLOGICAL: Unremarkable. PSYCH:  Appropriate.    Appointment on 02/03/2018  Component Date Value Ref Range Status  . WBC 02/03/2018 10.2  4.0 - 10.5 K/uL Final  . RBC 02/03/2018 4.81  3.87 - 5.11 MIL/uL Final  .  Hemoglobin 02/03/2018 12.2  12.0 - 15.0 g/dL Final  . HCT 02/03/2018 40.1  36.0 - 46.0 % Final  . MCV 02/03/2018 83.4  80.0 - 100.0 fL Final  . MCH 02/03/2018 25.4* 26.0 - 34.0 pg Final  . MCHC 02/03/2018 30.4  30.0 - 36.0 g/dL Final  . RDW 02/03/2018 16.3* 11.5 - 15.5 % Final  . Platelets 02/03/2018 197  150 - 400 K/uL Final  . nRBC 02/03/2018 0.0  0.0 - 0.2 % Final  . Neutrophils Relative % 02/03/2018 70  % Final  . Neutro Abs 02/03/2018 7.1  1.7 - 7.7 K/uL Final  . Lymphocytes Relative 02/03/2018 22  % Final  . Lymphs Abs 02/03/2018 2.2  0.7 - 4.0 K/uL Final  . Monocytes Relative 02/03/2018 5  % Final  . Monocytes Absolute 02/03/2018 0.5  0.1 - 1.0 K/uL Final  . Eosinophils Relative 02/03/2018 2  % Final  . Eosinophils Absolute 02/03/2018 0.2  0.0 - 0.5 K/uL Final  . Basophils Relative 02/03/2018 0  % Final  . Basophils Absolute 02/03/2018 0.0  0.0 - 0.1 K/uL Final  . Immature Granulocytes 02/03/2018 1  % Final  . Abs Immature Granulocytes 02/03/2018 0.07  0.00 - 0.07 K/uL Final   Performed at [facility], [address] [place], [place] [address]    Assessment:  MARITES NATH is a 38 y.o. female with iron deficiency anemia felt secondary to heavy menses.  She has B12 deficiency.  She developed microcytic indices in 05/2014 and became mildly anemic in  11/2014. Diet is good.  She has ice pica.  She denies any melena, hematochezia or hematuria.  She is on oral iron.  Labs revealed a + ANA (1:1280 centromere pattern; 2.3 AI centromere AB screen) on 03/03/2017.  Urinalysis on 02/27/2017 revealed no RBCs.  Ferritin was 11 on 02/20/2016.  Iron studies on 01/09/2017 revealed 8% saturation and TIBC 390.  Normal studies included: TSH, free T4, folate, and B12 on 02/20/2016.   Hemoglobin electrophoresis on 02/21/2016 revealed 98.3% HgA and 1.7% Hgb A2.  The low Hgb A2 may indicate an alpha thalassemia or iron deficiency.   Work-up on 03/19/2017 revealed a hematocrit of 36.6, hemoglobin 11.5, and MCV 70.8.  Ferritin was 15 with an iron saturation of 19% and a TIBC of 399.  B12 was 210 (low).  Folate and TSH were normal.  She has B12 deficiency.  B12 was 210 on 03/19/2017.  Intrinsic factor and anti-parietal cell antibodies were negative on 05/07/2017.  She began B12 injections on 04/16/2017 (last 12/10/2017).  She received Venofer x 3 (04/16/2017 - 04/30/2017) and x 3 (07/10/2017 - 07/23/2017).    Ferritin has been followed: 8 on 12/25/2016, 15 on 03/19/2017, 11 on 04/15/2017, 85 on 05/07/2017, 14 on 06/25/2017, and 79 on 08/19/2017.  Symptomatically,  she continues to be fatigued secondary to CHF.  Menses is now light.  Exam is stable.  Hemoglobin is normal.  Ferritin is 79.  Plan: 1.  Review labs from 08/19/2017.   2.  Iron deficiency anemia:  Hemoglobin 12.2. MCV 83.4.    Ferritin  X. 3.  B12 deficiency:   No Venofer today.  Discuss continuation of oral iron as tolerating well.  Ferritin goal is 100. 3.  Continue monthly B12 injections x 5 (due today). 4.  RTC in 3 months for labs (CBC with, ferritin, ESR) and B12 injection.  5.  RTC in 6 months for MD assessment, labs (CBC with differential, ferritin -day before),  B12 and +/- Venofer.    Lequita Asal, MD  02/04/2018,8:40 AM    I saw and evaluated the patient, participating in the key  portions of the service and reviewing pertinent diagnostic studies and records.  I reviewed the nurse practitioner's note and agree with the findings and the plan.  The assessment and plan were discussed with the patient.  Additional diagnostic studies of *** are needed to clarify *** and would change the clinical management.  A few ***multiple questions were asked by the patient and answered.   Nolon Stalls, MD 02/04/2018,8:40 AM

## 2018-06-24 ENCOUNTER — Telehealth: Payer: Self-pay

## 2018-06-24 NOTE — Telephone Encounter (Signed)
TELEPHONE CALL NOTE  Tracey White has been deemed a candidate for a follow-up tele-health visit to limit community exposure during the Covid-19 pandemic. I spoke with the patient via phone to ensure availability of phone/video source, confirm preferred email & phone number, discuss instructions and expectations, and review consent.   I reminded Tracey White to be prepared with any vital sign and/or heart rhythm information that could potentially be obtained via home monitoring, at the time of her visit.  Finally, I reminded Tracey White to expect an e-mail containing a link for their video-based visit approximately 15 minutes before her visit, or alternatively, a phone call at the time of her visit if her visit is planned to be a phone encounter.  Did the patient verbally consent to treatment as below? YES  Tracey White, CMA 06/24/2018 3:14 PM  CONSENT FOR TELE-HEALTH VISIT - PLEASE REVIEW  I hereby voluntarily request, consent and authorize [facility] and its employed or contracted physicians, physician assistants, nurse practitioners or other licensed health care professionals (the Practitioner), to provide me with telemedicine health care services (the "Services") as deemed necessary by the treating Practitioner. I acknowledge and consent to receive the Services by the Practitioner via telemedicine. I understand that the telemedicine visit will involve communicating with the Practitioner through telephonic communication technology and the disclosure of certain medical information by electronic transmission. I acknowledge that I have been given the opportunity to request an in-person assessment or other available alternative prior to the telemedicine visit and am voluntarily participating in the telemedicine visit.  I understand that I have the right to withhold or withdraw my consent to the use of telemedicine in the course of my care at any time, without affecting my right to  future care or treatment, and that the Practitioner or I may terminate the telemedicine visit at any time. I understand that I have the right to inspect all information obtained and/or recorded in the course of the telemedicine visit and may receive copies of available information for a reasonable fee.  I understand that some of the potential risks of receiving the Services via telemedicine include:  Marland Kitchen Delay or interruption in medical evaluation due to technological equipment failure or disruption; . Information transmitted may not be sufficient (e.g. poor resolution of images) to allow for appropriate medical decision making by the Practitioner; and/or  . In rare instances, security protocols could fail, causing a breach of personal health information.  Furthermore, I acknowledge that it is my responsibility to provide information about my medical history, conditions and care that is complete and accurate to the best of my ability. I acknowledge that Practitioner's advice, recommendations, and/or decision may be based on factors not within their control, such as incomplete or inaccurate data provided by me or lack of visual representation. I understand that the practice of medicine is not an exact science and that Practitioner makes no warranties or guarantees regarding treatment outcomes. I acknowledge that I will receive a copy of this consent concurrently upon execution via email to the email address I last provided but may also request a printed copy by calling the office of [facility].    I understand that my insurance may be billed for this visit.   I have read or had this consent read to me. . I understand the contents of this consent, which adequately explains the benefits and risks of the Services being provided via telemedicine.  Marland Kitchen  I have been provided ample opportunity to ask questions regarding this consent and the Services and have had my questions answered to my  satisfaction. . I give my informed consent for the services to be provided through the use of telemedicine in my medical care  By participating in this telemedicine visit I agree to the above.

## 2018-06-24 NOTE — Telephone Encounter (Signed)
   TELEPHONE CALL NOTE  This patient has been deemed a candidate for follow-up tele-health visit to limit community exposure during the Covid-19 pandemic. I spoke with the patient via phone to discuss instructions. The patient was advised to review the section on consent for treatment as well. The patient will receive a phone call 2-3 days prior to their E-Visit at which time consent will be verbally confirmed. A Virtual Office Visit appointment type has been scheduled for 06/25/2018 with Clarisa Kindred FNP.  [name], CMA 06/24/2018 3:13 PM

## 2018-06-25 ENCOUNTER — Ambulatory Visit: Payer: Medicare HMO | Attending: Family | Admitting: Family

## 2018-06-25 ENCOUNTER — Other Ambulatory Visit: Payer: Self-pay

## 2018-06-25 ENCOUNTER — Encounter: Payer: Self-pay | Admitting: Family

## 2018-06-25 VITALS — Wt 378.0 lb

## 2018-06-25 DIAGNOSIS — I5032 Chronic diastolic (congestive) heart failure: Secondary | ICD-10-CM

## 2018-06-25 DIAGNOSIS — E876 Hypokalemia: Secondary | ICD-10-CM

## 2018-06-25 DIAGNOSIS — G4733 Obstructive sleep apnea (adult) (pediatric): Secondary | ICD-10-CM

## 2018-06-25 DIAGNOSIS — F419 Anxiety disorder, unspecified: Secondary | ICD-10-CM

## 2018-06-25 NOTE — Progress Notes (Signed)
Virtual Visit via Telephone Note   Evaluation Performed:  Follow-up visit  This visit type was conducted due to national recommendations for restrictions regarding the COVID-19 Pandemic (e.g. social distancing).  This format is felt to be most appropriate for this patient at this time.  All issues noted in this document were discussed and addressed.  No physical exam was performed (except for noted visual exam findings with Video Visits).  Please refer to the patient's chart (MyChart message for video visits and phone note for telephone visits) for the patient's consent to telehealth for [facility]  Date:  06/25/2018   ID:  Tracey White, DOB September 24, 1980, MRN [id number]  Patient Location:  [address] [facility] [place] [address]   Provider location:   [facility] [address] [place], [place] [address]  PCP:  Lazarus Gowda, MD  Cardiologist:  Dorothyann Peng, MD Electrophysiologist:  None   Chief Complaint:  Shortness of breath  History of Present Illness:    Tracey White is a 38 y.o. female who presents via audio/video conferencing for a telehealth visit today.  Patient verified DOB and address.  The patient does not have symptoms concerning for COVID-19 infection (fever, chills, cough, or new SHORTNESS OF BREATH).   Patient reports moderate shortness of breath upon minimal exertion. She describes this as chronic in nature having been present for several years. She has associated anxiety, palpitations, cough, difficulty sleeping and fatigue along with this. She denies any swelling in her legs/ abdomen or chest pain. She does say that her weight has been fluctuating from 371-378 pounds. Is unsure of how much fluids she drinks during the day. Says that her lab work is getting rechecked today as her potassium level is low.   Prior CV studies:   The following studies were reviewed today:  Echo report from 04/04/17 reviewed and showed an EF of  >55%  Past Medical History:  Diagnosis Date  . Anxiety   . CHF (congestive heart failure) (HCC)   . Chronic heart failure (HCC)   . Morbid obesity (HCC)   . OSA (obstructive sleep apnea)    uses CPAP at home  . Oxygen deficiency 02/28/2017   3 liters  . Peripheral edema   . Scoliosis    Past Surgical History:  Procedure Laterality Date  . HERNIA REPAIR    . scoliosis repair    . TUBAL LIGATION       Current Meds  Medication Sig  . albuterol (PROVENTIL HFA;VENTOLIN HFA) 108 (90 Base) MCG/ACT inhaler Inhale 2 puffs into the lungs every 4 (four) hours as needed for wheezing or shortness of breath.  . bumetanide (BUMEX) 2 MG tablet Take 4 mg by mouth 2 (two) times daily.   Marland Kitchen ipratropium (ATROVENT) 0.06 % nasal spray Place 2 sprays into both nostrils 3 (three) times daily.  Marland Kitchen omeprazole (PRILOSEC) 20 MG capsule Take 20 mg by mouth daily.  . potassium chloride SA (K-DUR,KLOR-CON) 20 MEQ tablet Take 20 mEq by mouth 2 (two) times daily.   . Vitamin D, Ergocalciferol, (DRISDOL) 50000 units CAPS capsule Take 50,000 Units by mouth every 7 (seven) days.     Allergies:   Patient has no known allergies.   Social History   Tobacco Use  . Smoking status: Former Smoker    Packs/day: 0.20    Types: Cigarettes    Last attempt to quit: 02/16/2017    Years since quitting: 1.3  .  Smokeless tobacco: Never Used  . Tobacco comment: hasn't smoked in 3-4 months  Substance Use Topics  . Alcohol use: Yes    Alcohol/week: 1.0 - 2.0 standard drinks    Types: 1 - 2 Standard drinks or equivalent per week    Comment: occasionally  . Drug use: No     Family Hx: The patient's family history includes Cancer in her maternal grandmother; Diabetes in her mother; Hypertension in her mother.  ROS:   Please see the history of present illness.     All other systems reviewed and are negative.   Labs/Other Tests and Data Reviewed:    Recent Labs: 09/25/2017: BUN 9; Creatinine, Ser 0.72; Potassium  3.3; Sodium 141 02/03/2018: Hemoglobin 12.2; Platelets 197   Recent Lipid Panel No results found for: CHOL, TRIG, HDL, CHOLHDL, LDLCALC, LDLDIRECT  Wt Readings from Last 3 Encounters:  06/25/18 (!) 378 lb (171.5 kg)  11/03/17 (!) 368 lb (166.9 kg)  09/30/17 (!) 364 lb 6 oz (165.3 kg)     Exam:    Vital Signs:  Wt (!) 378 lb (171.5 kg) Comment: self-reported  BMI 71.42 kg/m    Well nourished, well developed female in no  acute distress.   ASSESSMENT & PLAN:    1. Chronic heart failure with preserved ejection fraction- - NYHA class III - euvolemic based on patient's description of symptoms - weighing daily and says that her weight is fluctuating between 371-378; reminded to call for an overnight weight gain of >2 pounds or a weekly weight gain of >5 pounds - not adding salt to her food - she is unsure of how much fluid she's drinking but says that her mouth "stays dry". Discussed the importance of keeping her daily fluid intake to ~ 60 ounces daily and to measure how much she is drinking - hypokalemia could limit increased diuretic use - saw cardiology Juliann Pares([place]) 06/23/2018  2: Obstructive sleep apnea- - wearing bipap nightly for 4-6 hours each night  3: Hypokalemia- - BMP 06/18/2018 reviewed and showed sodium 138, potassium 2.8, creatinine 0.7 & GFR 128 - patient says that she's getting it rechecked today - could consider adding spironolactone  4: Anxiety-   - patient says that her anxiety/ depression have worsened with the COVID-19 pandemic - she says that she's going to call a new psychiatrist so that she can get back on her medications - emotional support given  COVID-19 Education: The signs and symptoms of COVID-19 were discussed with the patient and how to seek care for testing (follow up with PCP or arrange E-visit).  The importance of social distancing was discussed today.  Patient Risk:   After full review of this patients clinical status, I feel that they are at  least moderate risk at this time.  Time:   Today, I have spent 15 minutes with the patient with telehealth technology discussing medications, weight and fluid intake.     Medication Adjustments/Labs and Tests Ordered: Current medicines are reviewed at length with the patient today.  Concerns regarding medicines are outlined above.   Tests Ordered: No orders of the defined types were placed in this encounter.  Medication Changes: No orders of the defined types were placed in this encounter.   Disposition:  Follow-up in 1 month or sooner for any questions/problems before then  Signed, Delma Freezeina A Jerelene Salaam, FNP  06/25/2018 10:18 AM    [facility]

## 2018-06-25 NOTE — Patient Instructions (Signed)
Continue weighing daily and call for an overnight weight gain of > 2 pounds or a weekly weight gain of >5 pounds. 

## 2018-07-28 ENCOUNTER — Telehealth: Payer: Self-pay

## 2018-07-28 NOTE — Telephone Encounter (Signed)
TELEPHONE CALL NOTE  Tracey White has been deemed a candidate for a follow-up tele-health visit to limit community exposure during the Covid-19 pandemic. I spoke with the patient via phone to ensure availability of phone/video source, confirm preferred email & phone number, discuss instructions and expectations, and review consent.   I reminded Tracey White to be prepared with any vital sign and/or heart rhythm information that could potentially be obtained via home monitoring, at the time of her visit.  Finally, I reminded Tracey White to expect an e-mail containing a link for their video-based visit approximately 15 minutes before her visit, or alternatively, a phone call at the time of her visit if her visit is planned to be a phone encounter.  Did the patient verbally consent to treatment as below? YES  Tracey White, CMA 07/28/2018 4:23 PM  CONSENT FOR TELE-HEALTH VISIT - PLEASE REVIEW  I hereby voluntarily request, consent and authorize [facility] and its employed or contracted physicians, physician assistants, nurse practitioners or other licensed health care professionals (the Practitioner), to provide me with telemedicine health care services (the "Services") as deemed necessary by the treating Practitioner. I acknowledge and consent to receive the Services by the Practitioner via telemedicine. I understand that the telemedicine visit will involve communicating with the Practitioner through telephonic communication technology and the disclosure of certain medical information by electronic transmission. I acknowledge that I have been given the opportunity to request an in-person assessment or other available alternative prior to the telemedicine visit and am voluntarily participating in the telemedicine visit.  I understand that I have the right to withhold or withdraw my consent to the use of telemedicine in the course of my care at any time, without affecting my right to  future care or treatment, and that the Practitioner or I may terminate the telemedicine visit at any time. I understand that I have the right to inspect all information obtained and/or recorded in the course of the telemedicine visit and may receive copies of available information for a reasonable fee.  I understand that some of the potential risks of receiving the Services via telemedicine include:  Marland Kitchen Delay or interruption in medical evaluation due to technological equipment failure or disruption; . Information transmitted may not be sufficient (e.g. poor resolution of images) to allow for appropriate medical decision making by the Practitioner; and/or  . In rare instances, security protocols could fail, causing a breach of personal health information.  Furthermore, I acknowledge that it is my responsibility to provide information about my medical history, conditions and care that is complete and accurate to the best of my ability. I acknowledge that Practitioner's advice, recommendations, and/or decision may be based on factors not within their control, such as incomplete or inaccurate data provided by me or lack of visual representation. I understand that the practice of medicine is not an exact science and that Practitioner makes no warranties or guarantees regarding treatment outcomes. I acknowledge that I will receive a copy of this consent concurrently upon execution via email to the email address I last provided but may also request a printed copy by calling the office of [facility].    I understand that my insurance may be billed for this visit.   I have read or had this consent read to me. . I understand the contents of this consent, which adequately explains the benefits and risks of the Services being provided via telemedicine.  Marland Kitchen  I have been provided ample opportunity to ask questions regarding this consent and the Services and have had my questions answered to my  satisfaction. . I give my informed consent for the services to be provided through the use of telemedicine in my medical care  By participating in this telemedicine visit I agree to the above.

## 2018-07-29 ENCOUNTER — Ambulatory Visit: Payer: Medicare HMO | Attending: Family | Admitting: Family

## 2018-07-29 ENCOUNTER — Encounter: Payer: Self-pay | Admitting: Family

## 2018-07-29 ENCOUNTER — Other Ambulatory Visit: Payer: Self-pay

## 2018-07-29 ENCOUNTER — Other Ambulatory Visit: Payer: Self-pay | Admitting: Pain Medicine

## 2018-07-29 VITALS — Wt 375.0 lb

## 2018-07-29 DIAGNOSIS — E559 Vitamin D deficiency, unspecified: Secondary | ICD-10-CM

## 2018-07-29 DIAGNOSIS — E876 Hypokalemia: Secondary | ICD-10-CM

## 2018-07-29 DIAGNOSIS — I5032 Chronic diastolic (congestive) heart failure: Secondary | ICD-10-CM

## 2018-07-29 DIAGNOSIS — G894 Chronic pain syndrome: Secondary | ICD-10-CM

## 2018-07-29 DIAGNOSIS — F419 Anxiety disorder, unspecified: Secondary | ICD-10-CM

## 2018-07-29 DIAGNOSIS — G4733 Obstructive sleep apnea (adult) (pediatric): Secondary | ICD-10-CM

## 2018-07-29 NOTE — Progress Notes (Signed)
Virtual Visit via Telephone Note   Evaluation Performed:  Follow-up visit  This visit type was conducted due to national recommendations for restrictions regarding the COVID-19 Pandemic (e.g. social distancing).  This format is felt to be most appropriate for this patient at this time.  All issues noted in this document were discussed and addressed.  No physical exam was performed (except for noted visual exam findings with Video Visits).  Please refer to the patient's chart (MyChart message for video visits and phone note for telephone visits) for the patient's consent to telehealth for Tracey White  Date:  07/29/2018   ID:  Tracey White, DOB 12-24-1980, MRN [id number]  Patient Location: [place] [address]   Provider location:   [facility] [place] [address] [place], [place] [address]  PCP:  Zeb Comfort, MD  Cardiologist:  Dr. Posey Pronto [facility] [place]) Electrophysiologist:  None   Chief Complaint:  fatigue  History of Present Illness:    Tracey White is a 38 y.o. female who presents via audio/video conferencing for a telehealth visit today.  Patient verified DOB and address.  The patient does not have symptoms concerning for COVID-19 infection (fever, chills, cough, or new SHORTNESS OF BREATH).   Patient reports moderate fatigue upon minimal exertion. She describes this as chronic in nature having been present for several years. She has associated difficulty sleeping, shortness of breath and fluctuating weight/ swelling along with this. She denies any dizziness, palpitations or chest pain. Does notice a cough when her fluid status worsens. Now seeing a cardiologist in [place], [place].  Prior CV studies:   The following studies were reviewed today:  Echo report from 04/04/17 reviewed and showed an EF of >55%  Past Medical History:  Diagnosis Date  . Anxiety   . CHF (congestive heart failure) ([place])   . Chronic heart failure ([place])   .  Morbid obesity ([place])   . OSA (obstructive sleep apnea)    uses CPAP at home  . Oxygen deficiency 02/28/2017   3 liters  . Peripheral edema   . Scoliosis    Past Surgical History:  Procedure Laterality Date  . HERNIA REPAIR    . scoliosis repair    . TUBAL LIGATION       Current Meds  Medication Sig  . albuterol (PROVENTIL HFA;VENTOLIN HFA) 108 (90 Base) MCG/ACT inhaler Inhale 2 puffs into the lungs every 4 (four) hours as needed for wheezing or shortness of breath.  . bumetanide (BUMEX) 2 MG tablet Take 4 mg by mouth 2 (two) times daily.   . hydrOXYzine (VISTARIL) 25 MG capsule Take 25 mg by mouth 3 (three) times daily as needed for anxiety.  Marland Kitchen ipratropium (ATROVENT) 0.06 % nasal spray Place 2 sprays into both nostrils 3 (three) times daily.  Marland Kitchen lamoTRIgine (LAMICTAL) 150 MG tablet Take 150 mg by mouth daily.  Marland Kitchen omeprazole (PRILOSEC) 20 MG capsule Take 20 mg by mouth daily.  . potassium chloride SA (K-DUR,KLOR-CON) 20 MEQ tablet Take 20 mEq by mouth 2 (two) times daily. Take 2 tablets AM/ 1 tablet PM  . spironolactone (ALDACTONE) 25 MG tablet Take 25 mg by mouth daily.  . Vitamin D, Ergocalciferol, (DRISDOL) 50000 units CAPS capsule Take 50,000 Units by mouth every 7 (seven) days.     Allergies:   Patient has no known allergies.   Social History   Tobacco Use  . Smoking status: Former Smoker  Packs/day: 0.20    Types: Cigarettes    Quit date: 02/16/2017    Years since quitting: 1.4  . Smokeless tobacco: Never Used  . Tobacco comment: hasn't smoked in 3-4 months  Substance Use Topics  . Alcohol use: Yes    Alcohol/week: 1.0 - 2.0 standard drinks    Types: 1 - 2 Standard drinks or equivalent per week    Comment: occasionally  . Drug use: No     Family Hx: The patient's family history includes Cancer in her maternal grandmother; Diabetes in her mother; Hypertension in her mother.  ROS:   Please see the history of present illness.     All other systems reviewed and  are negative.   Labs/Other Tests and Data Reviewed:    Recent Labs: 09/25/2017: BUN 9; Creatinine, Ser 0.72; Potassium 3.3; Sodium 141 02/03/2018: Hemoglobin 12.2; Platelets 197   Recent Lipid Panel No results found for: CHOL, TRIG, HDL, CHOLHDL, LDLCALC, LDLDIRECT  Wt Readings from Last 3 Encounters:  07/29/18 (!) 375 lb (170.1 kg)  06/25/18 (!) 378 lb (171.5 kg)  11/03/17 (!) 368 lb (166.9 kg)     Exam:    Vital Signs:  Wt (!) 375 lb (170.1 kg) Comment: self-reported  BMI 70.86 kg/m    Well nourished, well developed female in no  acute distress.   ASSESSMENT & PLAN:    1. Chronic heart failure with preserved ejection fraction- - NYHA class III - euvolemic based on patient's description of symptoms - weighing daily and says that her weight is fluctuating between 371-377; reminded to call for an overnight weight gain of >2 pounds or a weekly weight gain of >5 pounds - not adding salt to her food and hasn't been getting take out food - says that she's drinking ~ 60 ounces of fluid daily - hypokalemia could limit increased diuretic use - [facility] [facility] 06/29/2018 and returns 08/14/2018 for echo  2: Obstructive sleep apnea- - wearing bipap nightly for 4-6 hours each night  3: Hypokalemia- - BMP 06/18/2018 reviewed and showed sodium 138, potassium 2.8, creatinine 0.7 & GFR 128 - has had spironolactone added and she says that she's gotten her lab work rechecked recently  4: Anxiety-   - has been placed on medication for her depression/ anxiety and says that she's feeling a little bit better - has appointment 08/11/2018 regarding her bariatric surgery   COVID-19 Education: The signs and symptoms of COVID-19 were discussed with the patient and how to seek care for testing (follow up with PCP or arrange E-visit).  The importance of social distancing was discussed today.  Patient Risk:   After full review of this patients clinical status, I feel that they are  at least moderate risk at this time.  Time:   Today, I have spent 15 minutes with the patient with telehealth technology discussing medications, weight and symptoms to report.     Medication Adjustments/Labs and Tests Ordered: Current medicines are reviewed at length with the patient today.  Concerns regarding medicines are outlined above.   Tests Ordered: No orders of the defined types were placed in this encounter.  Medication Changes: No orders of the defined types were placed in this encounter.   Disposition:  Follow-up in 4 months or sooner for any questions/problems before then.   Signed, Delma Freezeina A Hackney, FNP  07/29/2018 9:12 AM    [facility]

## 2018-07-29 NOTE — Patient Instructions (Signed)
Continue weighing daily and call for an overnight weight gain of > 2 pounds or a weekly weight gain of >5 pounds. 

## 2018-09-02 ENCOUNTER — Other Ambulatory Visit: Payer: Self-pay | Admitting: Family

## 2018-12-01 ENCOUNTER — Ambulatory Visit: Payer: Medicare HMO | Admitting: Family

## 2018-12-03 NOTE — Progress Notes (Deleted)
Patient ID: Tracey White, female    DOB: [birth year], 38 y.o.   MRN: [id number]  HPI  Tracey White is a 38 y/o female with a history of anxiety, obstructive sleep apnea (with nasal CPAP), scoliosis, recent tobacco use and chronic heart failure.   Echo done 04/04/17 at [facility] showed an EF of >55%. Reviewed last echo report done 10/19/15 which showed an EF of 65-70% along with trivial TR.   Admitted 06/10/2018 due to acute on chronic HF. Discharged after 2 days.    She presents today for a follow-up visit with a chief complaint of   Past Medical History:  Diagnosis Date  . Anxiety   . CHF (congestive heart failure) (HCC)   . Chronic heart failure (HCC)   . Morbid obesity (HCC)   . OSA (obstructive sleep apnea)    uses CPAP at home  . Oxygen deficiency 02/28/2017   3 liters  . Peripheral edema   . Scoliosis    Past Surgical History:  Procedure Laterality Date  . HERNIA REPAIR    . scoliosis repair    . TUBAL LIGATION     Family History  Problem Relation Age of Onset  . Diabetes Mother   . Hypertension Mother   . Cancer Maternal Grandmother    Social History   Tobacco Use  . Smoking status: Former Smoker    Packs/day: 0.20    Types: Cigarettes    Quit date: 02/16/2017    Years since quitting: 1.7  . Smokeless tobacco: Never Used  . Tobacco comment: hasn't smoked in 3-4 months  Substance Use Topics  . Alcohol use: Yes    Alcohol/week: 1.0 - 2.0 standard drinks    Types: 1 - 2 Standard drinks or equivalent per week    Comment: occasionally   No Known Allergies   Review of Systems  Constitutional: Positive for fatigue (with minimal exertion). Negative for appetite change.  HENT: Negative for congestion, postnasal drip and sore throat.   Eyes: Negative.   Respiratory: Positive for cough (productive), shortness of breath and wheezing ("little bit"). Negative for chest tightness.   Cardiovascular: Positive for leg swelling. Negative for chest pain and palpitations.   Gastrointestinal: Positive for nausea. Negative for abdominal distention and abdominal pain.  Endocrine: Negative.   Genitourinary: Negative.   Musculoskeletal: Positive for arthralgias and back pain.  Skin: Negative.   Allergic/Immunologic: Negative.   Neurological: Positive for headaches. Negative for dizziness, weakness and light-headedness.  Hematological: Negative for adenopathy. Does not bruise/bleed easily.  Psychiatric/Behavioral: Positive for dysphoric mood. Negative for sleep disturbance and suicidal ideas. The patient is not nervous/anxious.     Physical Exam  Constitutional: She is oriented to person, place, and time. She appears well-developed and well-nourished.  HENT:  Head: Normocephalic and atraumatic.  Neck: Normal range of motion. Neck supple. No JVD present.  Cardiovascular: Normal rate and regular rhythm.  Pulmonary/Chest: Effort normal. She has no wheezes. She has no rhonchi. She has no rales.  Abdominal: Soft. She exhibits no distension. There is no abdominal tenderness.  Musculoskeletal:        General: Edema (1+ pitting in bilateral lower legs) present. No tenderness.  Neurological: She is alert and oriented to person, place, and time.  Skin: Skin is warm and dry.  Psychiatric: She has a normal mood and affect. Her behavior is normal. Thought content normal.  Nursing note and vitals reviewed.  Assessment & Plan:  1: Chronic heart failure with preserved ejection fraction- -  NYHA class III - minimally fluid overloaded today with pedal edema although stable - weighing daily. Reminded her to call for an overnight weight gain of >2 pounds or a weekly weight gain of >5 pounds.  - weight 368 pounds since she was last here > 1 year ago - not adding salt and has been trying to read food labels more often.  - saw cardiologist Posey Pronto) 06/29/2018 - BMP from 09/25/17 reviewed and showed sodium 141, potassium 3.3, creatinine 0.72  and GFR >60 - has been going through the  process of obtaining bariatric surgery (nutritionist, mental health etc) - BNP 03/24/17 was 17.0  2: Obstructive sleep apnea- - saw PCP Pricilla Riffle) 06/18/2018  3: Lymphedema- - stage 1 lymphedema  - has been wearing compression boots daily - says that her edema has improved  4: Tobacco use- - no tobacco in the last 4 months - discussed complete cessation for 3 minutes with her  Patient did not bring her medications nor a list. Each medication was verbally reviewed with the patient and she was encouraged to bring the bottles to every visit to confirm accuracy of list.

## 2018-12-04 ENCOUNTER — Telehealth: Payer: Self-pay | Admitting: Family

## 2018-12-04 ENCOUNTER — Ambulatory Visit: Payer: Medicare HMO | Admitting: Family

## 2018-12-04 NOTE — Telephone Encounter (Signed)
Patient did not show for her [facility] Failure Clinic appointment on 12/04/2018. Will attempt to reschedule.

## 2019-01-06 DIAGNOSIS — Z9884 Bariatric surgery status: Secondary | ICD-10-CM | POA: Insufficient documentation

## 2019-01-29 HISTORY — PX: LAPAROSCOPIC GASTRIC SLEEVE RESECTION: SHX5895

## 2019-07-13 DIAGNOSIS — R7303 Prediabetes: Secondary | ICD-10-CM | POA: Insufficient documentation

## 2019-07-13 DIAGNOSIS — R7309 Other abnormal glucose: Secondary | ICD-10-CM | POA: Insufficient documentation

## 2019-07-13 NOTE — Progress Notes (Signed)
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 Encounters:  07/14/19 (!) 332 lb (150.6 kg)  07/29/18 (!) 375 lb (170.1 kg)  06/25/18 (!) 378 lb (171.5 kg)  11/03/17 (!) 368 lb (166.9 kg)    Psych/Mental status: Alert, oriented x 3 (person, place, & time)       Eyes: PERLA Respiratory: No evidence of acute respiratory distress  Cervical Spine Exam  Skin & Axial Inspection: No masses, redness, edema, swelling, or associated skin lesions Alignment: Symmetrical Functional ROM: Unrestricted ROM  Stability: No instability detected Muscle Tone/Strength: Functionally intact. No obvious neuro-muscular anomalies detected. Sensory (Neurological): Unimpaired Palpation: No palpable anomalies              Upper Extremity (UE) Exam    Side: Right upper extremity  Side: Left upper extremity  Skin & Extremity Inspection: Skin color, temperature, and hair growth are WNL. No peripheral edema or cyanosis. No masses, redness, swelling, asymmetry, or  associated skin lesions. No contractures.  Skin & Extremity Inspection: Skin color, temperature, and hair growth are WNL. No peripheral edema or cyanosis. No masses, redness, swelling, asymmetry, or associated skin lesions. No contractures.  Functional ROM: Unrestricted ROM          Functional ROM: Unrestricted ROM          Muscle Tone/Strength: Functionally intact. No obvious neuro-muscular anomalies detected.  Muscle Tone/Strength: Functionally intact. No obvious neuro-muscular anomalies detected.  Sensory (Neurological): Unimpaired          Sensory (Neurological): Unimpaired          Palpation: No palpable anomalies              Palpation: No palpable anomalies              Provocative Test(s):  Phalen's test: deferred Tinel's test: deferred Apley's scratch test (touch opposite shoulder):  Action 1 (Across chest): deferred Action 2 (Overhead): deferred Action 3 (LB reach): deferred   Provocative Test(s):  Phalen's test: deferred Tinel's test: deferred Apley's scratch test (touch opposite shoulder):  Action 1 (Across chest): deferred Action 2 (Overhead): deferred Action 3 (LB reach): deferred    Thoracic Spine Area Exam  Skin & Axial Inspection: No masses, redness, or swelling Alignment: Symmetrical Functional ROM: Unrestricted ROM Stability: No instability detected Muscle Tone/Strength: Functionally intact. No obvious neuro-muscular anomalies detected. Sensory (Neurological): Unimpaired Muscle strength & Tone: No palpable anomalies  Lumbar Exam  Skin & Axial Inspection: Well healed scar from previous spine surgery detected Alignment: Symmetrical Functional ROM: Decreased ROM affecting both sides Stability: No instability detected Muscle Tone/Strength: Increased muscle tone over affected area Sensory (Neurological): Movement-associated pain Palpation: Complains of area being tender to palpation       Provocative Tests: Hyperextension/rotation test: (+) bilaterally for facet  joint pain. Lumbar quadrant test (Kemp's test): (+) bilaterally for facet joint pain. Lateral bending test: (-)       Patrick's Maneuver: (+) for right-sided S-I arthralgia             FABER* test: (+) for right-sided S-I arthralgia             S-I anterior distraction/compression test: deferred today         S-I lateral compression test: deferred today         S-I Thigh-thrust test: deferred today         S-I Gaenslen's test: deferred today         *(Flexion, ABduction and External Rotation)  Gait & Posture Assessment  Ambulation: Limited Gait: Modified gait pattern (slower gait speed, wider stride width, and longer stance duration) associated with morbid obesity Posture: Difficulty standing up straight, due to pain   Lower Extremity Exam    Side: Right lower extremity  Side: Left lower extremity  Stability: No instability observed          Stability: No instability observed          Skin & Extremity Inspection: Skin color, temperature, and hair growth are WNL. No peripheral edema or cyanosis.  No masses, redness, swelling, asymmetry, or associated skin lesions. No contractures.  Skin & Extremity Inspection: Skin color, temperature, and hair growth are WNL. No peripheral edema or cyanosis. No masses, redness, swelling, asymmetry, or associated skin lesions. No contractures.  Functional ROM: Unrestricted ROM         Limited SLR (straight leg raise)  Functional ROM: Unrestricted ROM         Adequate SLR (straight leg raise)  Muscle Tone/Strength: Able to Toe-walk & Heel-walk without problems  Muscle Tone/Strength: Able to Toe-walk & Heel-walk without problems  Sensory (Neurological): Referred pain pattern        Sensory (Neurological): Unimpaired        DTR: Patellar: 0: absent Achilles: 0: absent Plantar: deferred today  DTR: Patellar: 0: absent Achilles: 0: absent Plantar: deferred today  Palpation: No palpable anomalies  Palpation: No palpable anomalies   Assessment  Primary  Diagnosis & Pertinent Problem List: The primary encounter diagnosis was Chronic pain syndrome. Diagnoses of Chronic low back pain (1ry area of Pain) (Bilateral) (R>L) w/o sciatica, Lumbar facet syndrome (Bilateral) (R>L), Chronic sacroiliac joint pain (Right), Chronic lower extremity pain (2ry area of Pain) (Right), Chronic knee pain (3ry area of Pain) (Right), Osteoarthritis of knees (Bilateral), Chronic hip pain (4th area of Pain) (Right), Pharmacologic therapy, Disorder of skeletal system, Problems influencing health status, Elevated hemoglobin A1c, and Morbid obesity with body mass index (BMI) greater than or equal to 70 in adult [facility]) were also pertinent to this visit.  Visit Diagnosis (New problems to examiner): 1. Chronic pain syndrome   2. Chronic low back pain (1ry area of Pain) (Bilateral) (R>L) w/o sciatica   3. Lumbar facet syndrome (Bilateral) (R>L)   4. Chronic sacroiliac joint pain (Right)   5. Chronic lower extremity pain (2ry area of Pain) (Right)   6. Chronic knee pain (3ry area of Pain) (Right)   7. Osteoarthritis of knees (Bilateral)   8. Chronic hip pain (4th area of Pain) (Right)   9. Pharmacologic therapy   10. Disorder of skeletal system   11. Problems influencing health status   12. Elevated hemoglobin A1c   13. Morbid obesity with body mass index (BMI) greater than or equal to 70 in adult [facility])    Plan of Care (Initial workup plan)  Note: Ms. Rottman was reminded that as per protocol, today's visit has been an evaluation only. We have not taken over the patient's controlled substance management.  Problem-specific plan: No problem-specific Assessment & Plan notes found for this encounter.   Lab Orders     Compliance Drug Analysis, Ur     Comp. Metabolic Panel (12)     Magnesium     Vitamin B12     Sedimentation rate     25-Hydroxy vitamin D Lcms D2+D3     C-reactive protein     Hemoglobin A1c  Imaging Orders     DG Lumbar Spine Complete W/Bend     DG Si  Joints     DG HIP UNILAT W OR W/O PELVIS 2-3 VIEWS RIGHT     DG Knee 1-2 Views Right Referral Orders  No referral(s) requested today   Procedure Orders    No procedure(s) ordered today   Pharmacotherapy (current): Medications ordered:  No orders of the defined types were placed in this encounter.  Medications administered during this visit: Sheza A. Grant had no medications administered during this visit.   Pharmacological management options:  Opioid Analgesics: The patient was informed that  there is no guarantee that she would be a candidate for opioid analgesics. The decision will be made following CDC guidelines. This decision will be based on the results of diagnostic studies, as well as Ms. Davisson's risk profile.   Membrane stabilizer: To be determined at a later time  Muscle relaxant: To be determined at a later time  NSAID: To be determined at a later time  Other analgesic(s): To be determined at a later time   Interventional management options: Planned, scheduled, and/or pending:    No procedures until BMI is below 35 kg/m2.   Considering:   Diagnostic bilateral lumbar facet block #1  Possible bilateral lumbar facet RFA, once she brings her BMI below 30   PRN Procedures:   None at this time   Provider-requested follow-up: Return for (VV), (2V), (s/p Tests).  Future Appointments  Date Time Provider [place]  08/09/2019 10:45 AM Milinda Pointer, MD [facility] None    Note by: Gaspar Cola, MD Date: 07/14/2019; Time: 11:55 AM

## 2019-07-13 NOTE — Patient Instructions (Addendum)
____________________________________________________________________________________________  Muscle Spasms & Cramps  Cause:  The most common cause of muscle spasms and cramps is vitamin and/or electrolyte (calcium, potassium, sodium, etc.) deficiencies.  Possible triggers: Sweating - causes loss of electrolytes thru the skin. Steroids - causes loss of electrolytes thru the urine.  Treatment: 1. Gatorade (or any other electrolyte-replenishing drink) - Take 1, 8 oz glass with each meal (3 times a day). 2. OTC (over-the-counter) Magnesium 400 to 500 mg - Take 1 tablet twice a day (one with breakfast and one before bedtime). If you have kidney problems, talk to your primary care physician before taking any Magnesium. 3. Tonic Water with quinine - Take 1, 8 oz glass before bedtime.   ____________________________________________________________________________________________   ____________________________________________________________________________________________  General Risks and Possible Complications  Patient Responsibilities: It is important that you read this as it is part of your informed consent. It is our duty to inform you of the risks and possible complications associated with treatments offered to you. It is your responsibility as a patient to read this and to ask questions about anything that is not clear or that you believe was not covered in this document.  Patient's Rights: You have the right to refuse treatment. You also have the right to change your mind, even after initially having agreed to have the treatment done. However, under this last option, if you wait until the last second to change your mind, you may be charged for the materials used up to that point.  Introduction: Medicine is not an Visual merchandiser. Everything in Medicine, including the lack of treatment(s), carries the potential for danger, harm, or loss (which is by definition: Risk). In Medicine, a  complication is a secondary problem, condition, or disease that can aggravate an already existing one. All treatments carry the risk of possible complications. The fact that a side effects or complications occurs, does not imply that the treatment was conducted incorrectly. It must be clearly understood that these can happen even when everything is done following the highest safety standards.  No treatment: You can choose not to proceed with the proposed treatment alternative. The "PRO(s)" would include: avoiding the risk of complications associated with the therapy. The "CON(s)" would include: not getting any of the treatment benefits. These benefits fall under one of three categories: diagnostic; therapeutic; and/or palliative. Diagnostic benefits include: getting information which can ultimately lead to improvement of the disease or symptom(s). Therapeutic benefits are those associated with the successful treatment of the disease. Finally, palliative benefits are those related to the decrease of the primary symptoms, without necessarily curing the condition (example: decreasing the pain from a flare-up of a chronic condition, such as incurable terminal cancer).  General Risks and Complications: These are associated to most interventional treatments. They can occur alone, or in combination. They fall under one of the following six (6) categories: no benefit or worsening of symptoms; bleeding; infection; nerve damage; allergic reactions; and/or death. 1. No benefits or worsening of symptoms: In Medicine there are no guarantees, only probabilities. No healthcare provider can ever guarantee that a medical treatment will work, they can only state the probability that it may. Furthermore, there is always the possibility that the condition may worsen, either directly, or indirectly, as a consequence of the treatment. 2. Bleeding: This is more common if the patient is taking a blood thinner, either prescription or  over the counter (example: Goody Powders, Fish oil, Aspirin, Garlic, etc.), or if suffering a condition associated with impaired coagulation (example:  Hemophilia, cirrhosis of the liver, low platelet counts, etc.). However, even if you do not have one on these, it can still happen. If you have any of these conditions, or take one of these drugs, make sure to notify your treating physician. 3. Infection: This is more common in patients with a compromised immune system, either due to disease (example: diabetes, cancer, human immunodeficiency virus [HIV], etc.), or due to medications or treatments (example: therapies used to treat cancer and rheumatological diseases). However, even if you do not have one on these, it can still happen. If you have any of these conditions, or take one of these drugs, make sure to notify your treating physician. 4. Nerve Damage: This is more common when the treatment is an invasive one, but it can also happen with the use of medications, such as those used in the treatment of cancer. The damage can occur to small secondary nerves, or to large primary ones, such as those in the spinal cord and brain. This damage may be temporary or permanent and it may lead to impairments that can range from temporary numbness to permanent paralysis and/or brain death. 5. Allergic Reactions: Any time a substance or material comes in contact with our body, there is the possibility of an allergic reaction. These can range from a mild skin rash (contact dermatitis) to a severe systemic reaction (anaphylactic reaction), which can result in death. 6. Death: In general, any medical intervention can result in death, most of the time due to an unforeseen complication. ____________________________________________________________________________________________  Please get your x-rays done as soon as possible.

## 2019-07-14 ENCOUNTER — Encounter: Payer: Self-pay | Admitting: Pain Medicine

## 2019-07-14 ENCOUNTER — Other Ambulatory Visit: Payer: Self-pay

## 2019-07-14 ENCOUNTER — Ambulatory Visit: Payer: Medicare HMO | Attending: Pain Medicine | Admitting: Pain Medicine

## 2019-07-14 VITALS — BP 132/90 | HR 98 | Temp 98.1°F | Resp 18 | Ht 61.0 in | Wt 332.0 lb

## 2019-07-14 DIAGNOSIS — Z79899 Other long term (current) drug therapy: Secondary | ICD-10-CM | POA: Diagnosis present

## 2019-07-14 DIAGNOSIS — R7309 Other abnormal glucose: Secondary | ICD-10-CM

## 2019-07-14 DIAGNOSIS — M79604 Pain in right leg: Secondary | ICD-10-CM | POA: Insufficient documentation

## 2019-07-14 DIAGNOSIS — G8929 Other chronic pain: Secondary | ICD-10-CM

## 2019-07-14 DIAGNOSIS — M533 Sacrococcygeal disorders, not elsewhere classified: Secondary | ICD-10-CM | POA: Diagnosis present

## 2019-07-14 DIAGNOSIS — M47816 Spondylosis without myelopathy or radiculopathy, lumbar region: Secondary | ICD-10-CM | POA: Diagnosis not present

## 2019-07-14 DIAGNOSIS — M25551 Pain in right hip: Secondary | ICD-10-CM | POA: Insufficient documentation

## 2019-07-14 DIAGNOSIS — Z789 Other specified health status: Secondary | ICD-10-CM | POA: Diagnosis present

## 2019-07-14 DIAGNOSIS — M25561 Pain in right knee: Secondary | ICD-10-CM | POA: Insufficient documentation

## 2019-07-14 DIAGNOSIS — G894 Chronic pain syndrome: Secondary | ICD-10-CM

## 2019-07-14 DIAGNOSIS — M545 Low back pain, unspecified: Secondary | ICD-10-CM

## 2019-07-14 DIAGNOSIS — M899 Disorder of bone, unspecified: Secondary | ICD-10-CM

## 2019-07-14 DIAGNOSIS — Z6841 Body Mass Index (BMI) 40.0 and over, adult: Secondary | ICD-10-CM

## 2019-07-14 DIAGNOSIS — M170 Bilateral primary osteoarthritis of knee: Secondary | ICD-10-CM | POA: Diagnosis present

## 2019-07-14 NOTE — Progress Notes (Signed)
Safety precautions to be maintained throughout the outpatient stay will include: orient to surroundings, keep bed in low position, maintain call bell within reach at all times, provide assistance with transfer out of bed and ambulation.  

## 2019-07-15 ENCOUNTER — Ambulatory Visit
Admission: RE | Admit: 2019-07-15 | Discharge: 2019-07-15 | Disposition: A | Discharge status: Home / Self Care | Payer: Medicare HMO | Source: Ambulatory Visit | Attending: Pain Medicine | Admitting: Pain Medicine

## 2019-07-15 ENCOUNTER — Ambulatory Visit
Admission: RE | Admit: 2019-07-15 | Discharge: 2019-07-15 | Disposition: A | Discharge status: Home / Self Care | Payer: Medicare HMO | Attending: Pain Medicine | Admitting: Pain Medicine

## 2019-07-15 DIAGNOSIS — M47816 Spondylosis without myelopathy or radiculopathy, lumbar region: Secondary | ICD-10-CM | POA: Insufficient documentation

## 2019-07-15 DIAGNOSIS — M533 Sacrococcygeal disorders, not elsewhere classified: Secondary | ICD-10-CM

## 2019-07-15 DIAGNOSIS — M170 Bilateral primary osteoarthritis of knee: Secondary | ICD-10-CM

## 2019-07-15 DIAGNOSIS — M25551 Pain in right hip: Secondary | ICD-10-CM | POA: Insufficient documentation

## 2019-07-15 DIAGNOSIS — G8929 Other chronic pain: Secondary | ICD-10-CM | POA: Diagnosis present

## 2019-07-15 DIAGNOSIS — M25561 Pain in right knee: Secondary | ICD-10-CM

## 2019-07-15 IMAGING — CR DG KNEE 1-2V*R*
1 series · 2 of 2 positions shown · non-contrast
Comparison: No recent prior.

CLINICAL DATA: Right hip pain.  Arthralgia.

EXAM:
RIGHT KNEE - 1-2 VIEW

[Series 1: dg knee 1-2 views right · 0.14mm/px · 2 of 2 slices shown]
[im 1/2]
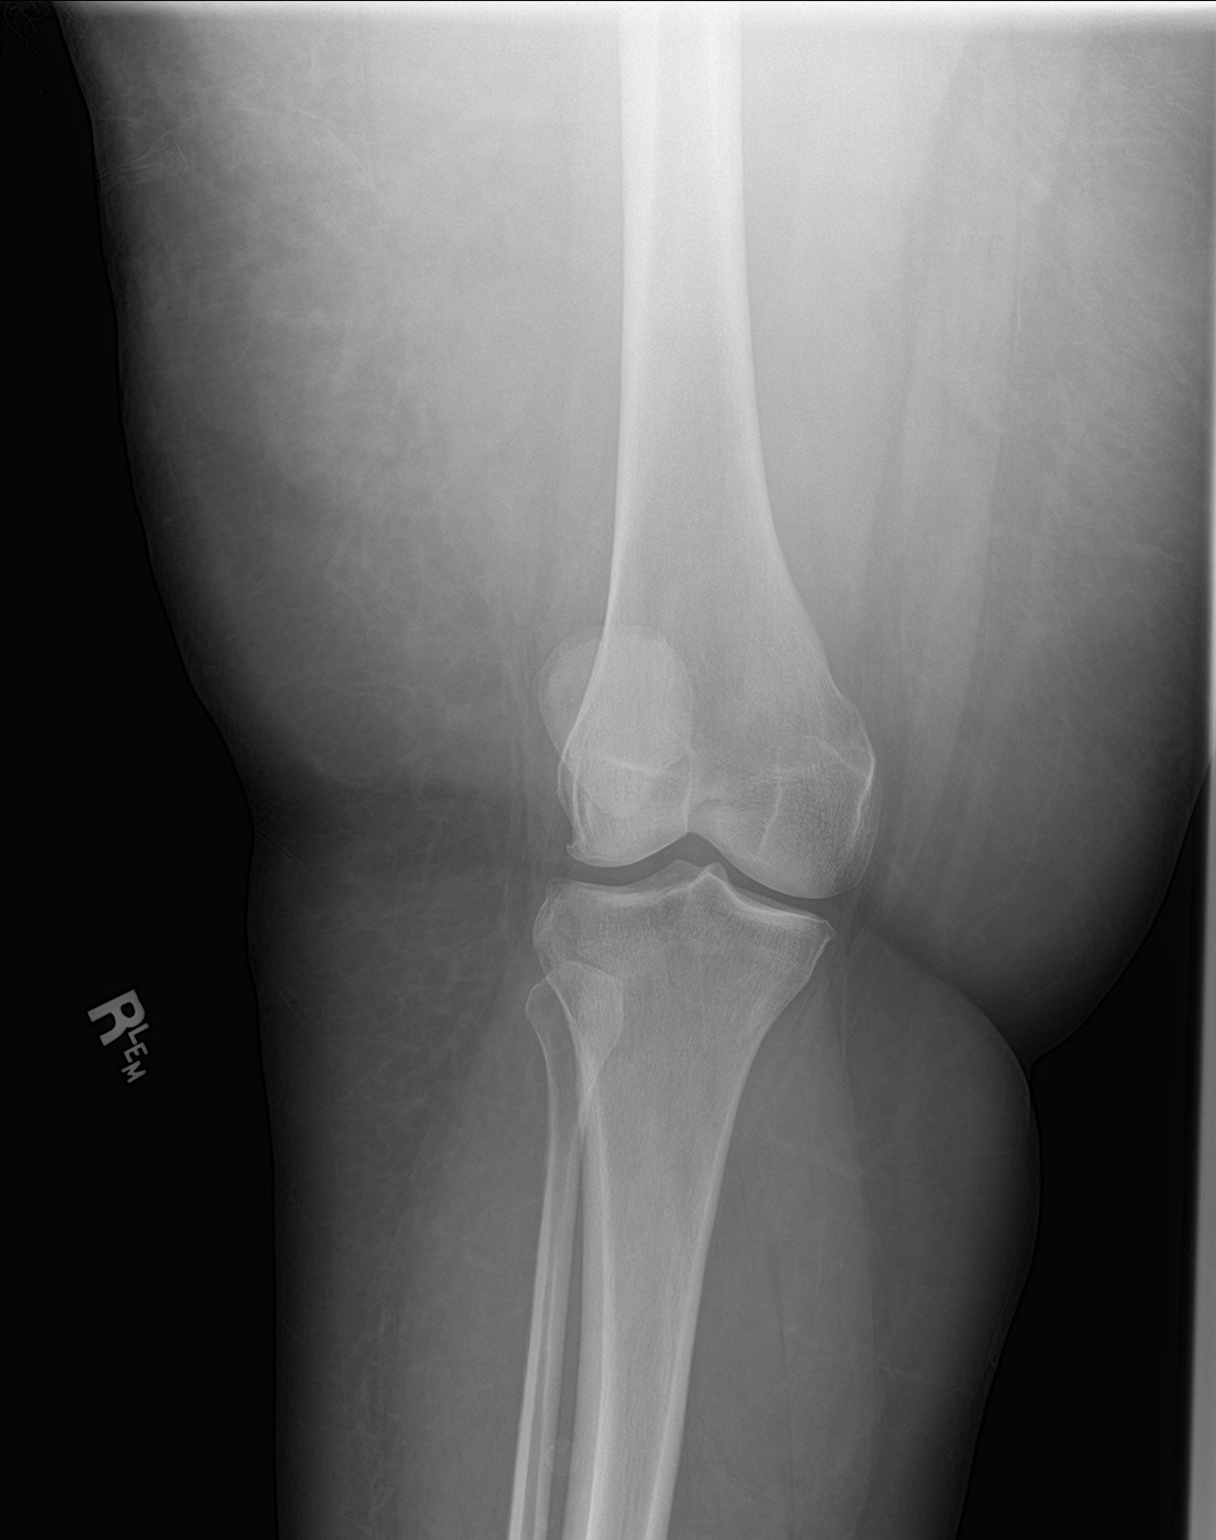
[im 2/2]
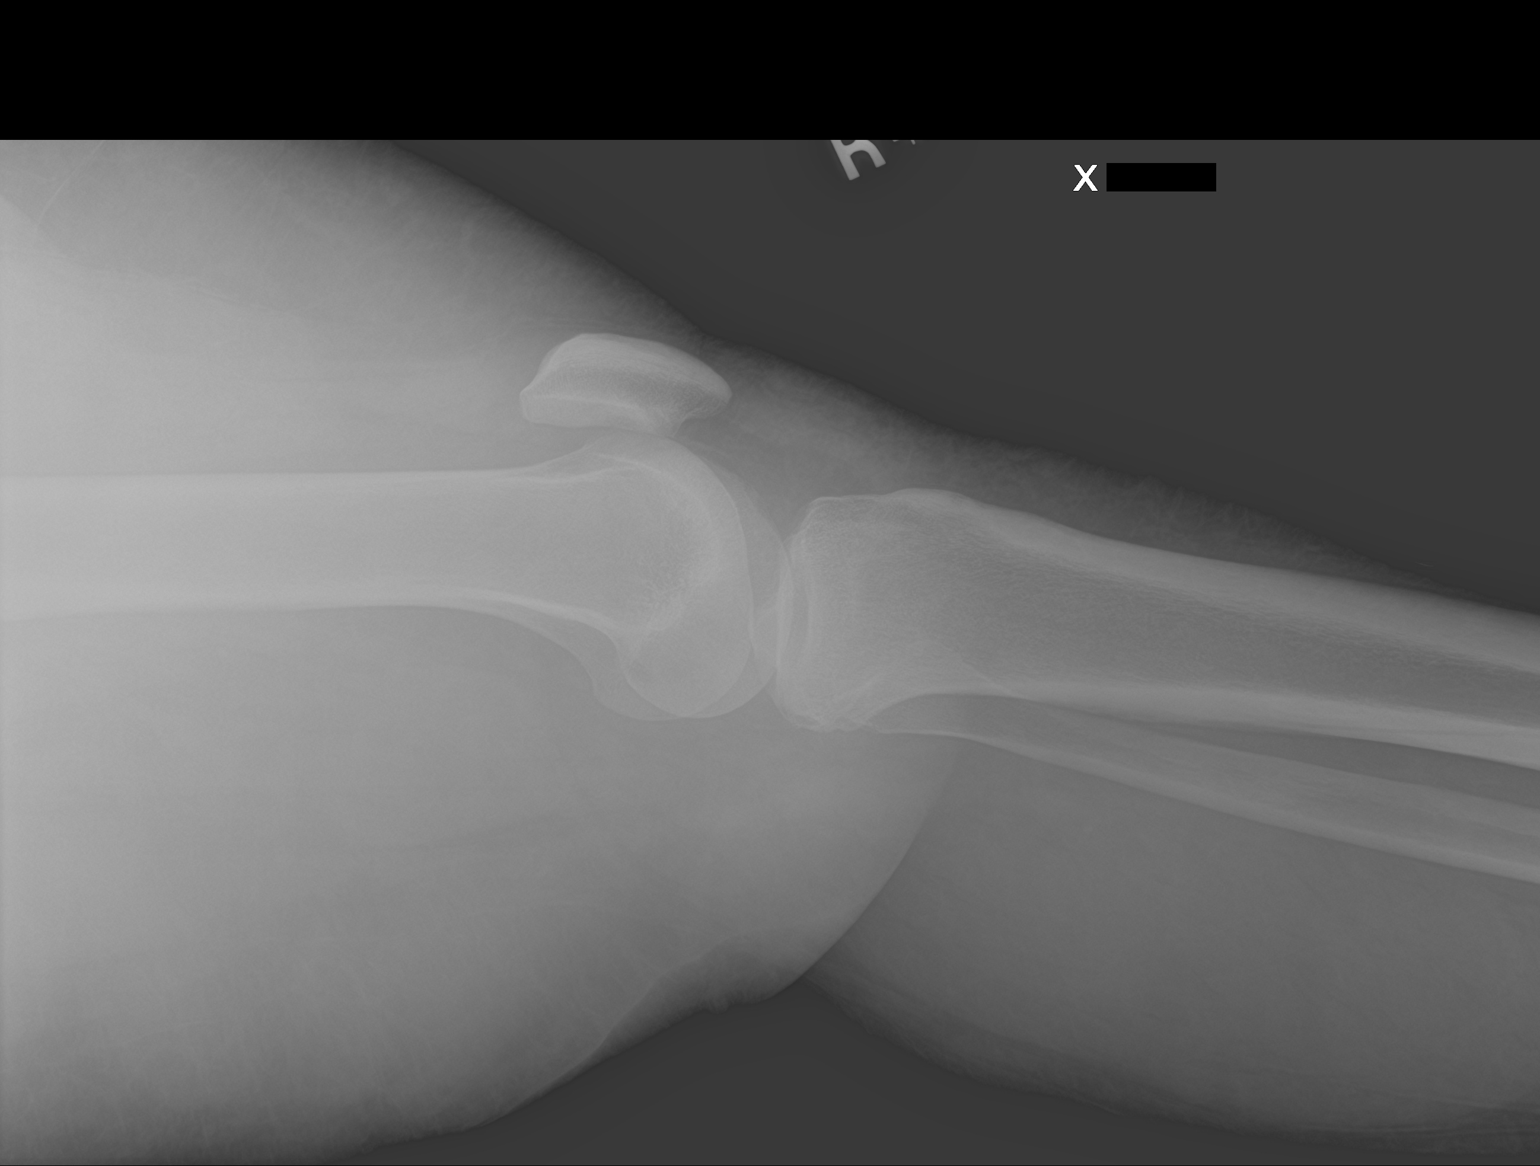

[2 of 2 positions shown; findings below may reference images not displayed]

FINDINGS: Mild patellofemoral and lateral compartment degenerative change. No
evidence of erosive arthropathy. No acute bony abnormality
identified. No evidence of fracture dislocation. No effusion noted.
IMPRESSION: Mild patellofemoral lateral compartment degenerative change. No
acute abnormality identified.

## 2019-07-15 IMAGING — CR DG SI JOINTS 3+V
1 series · 3 of 3 positions shown · non-contrast
Comparison: CT [DATE].

CLINICAL DATA: Sacroiliac joint pain.

EXAM:
BILATERAL SACROILIAC JOINTS - 3+ VIEW

[Series 1: dg si joints · 0.14mm/px · 3 of 3 slices shown]
[im 1/3]
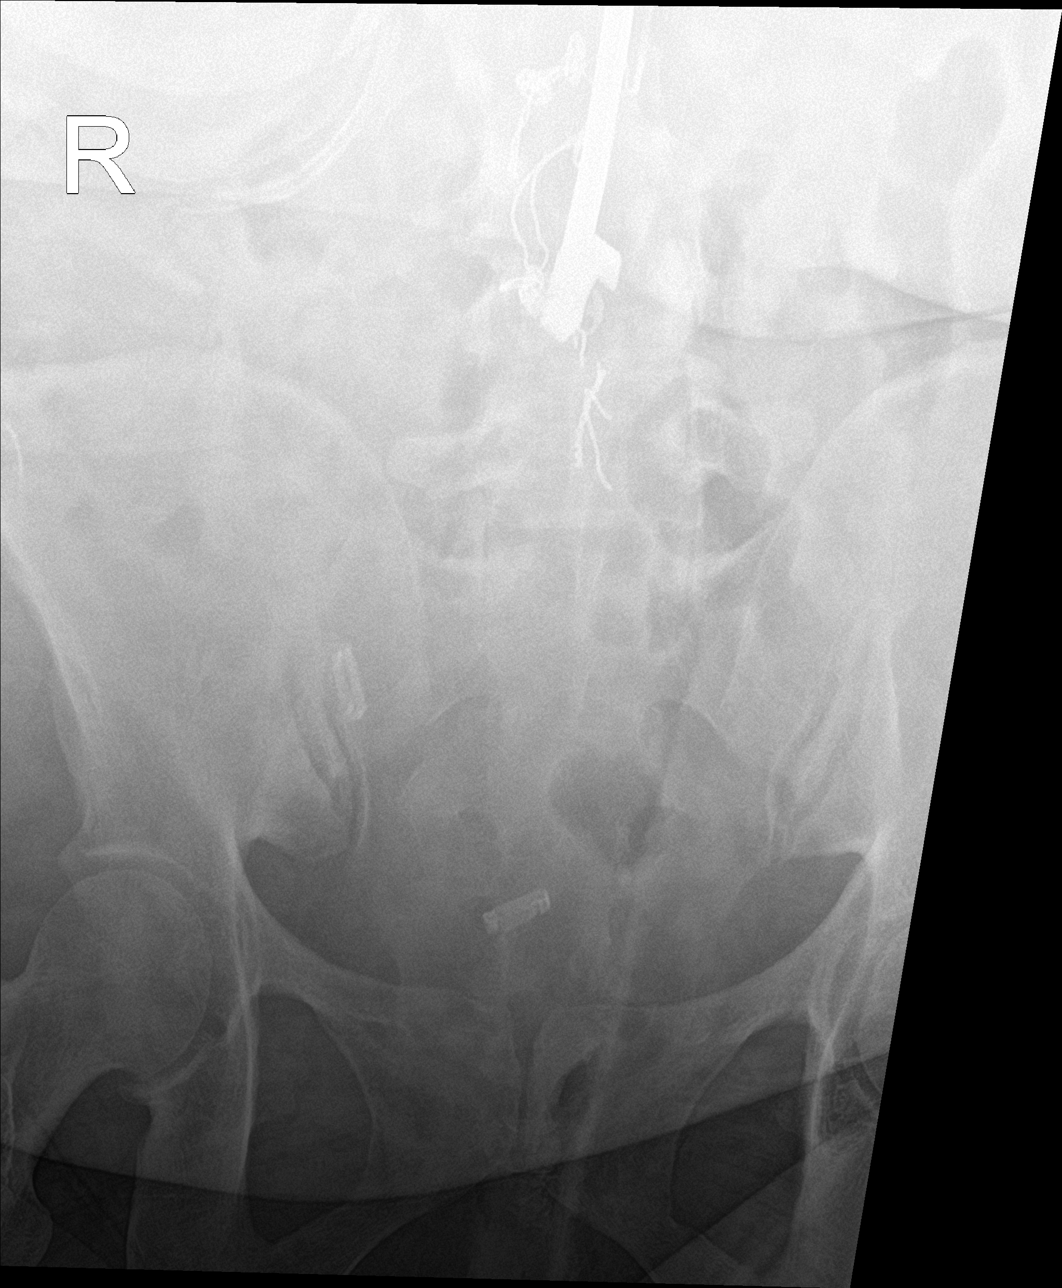
[im 2/3]
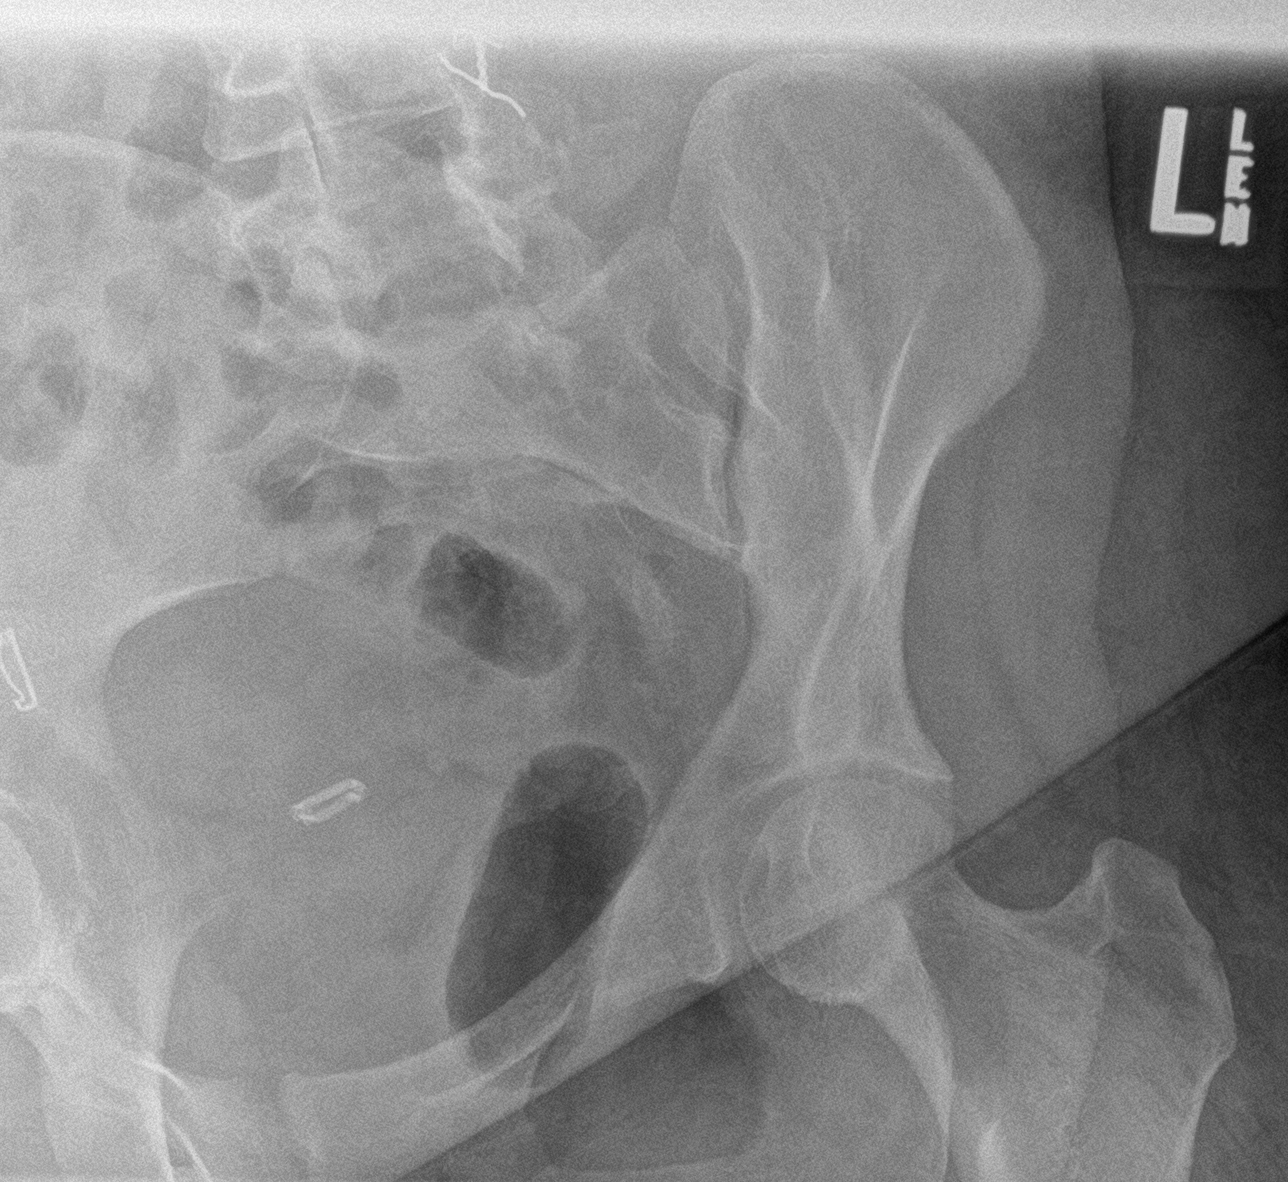
[im 3/3]
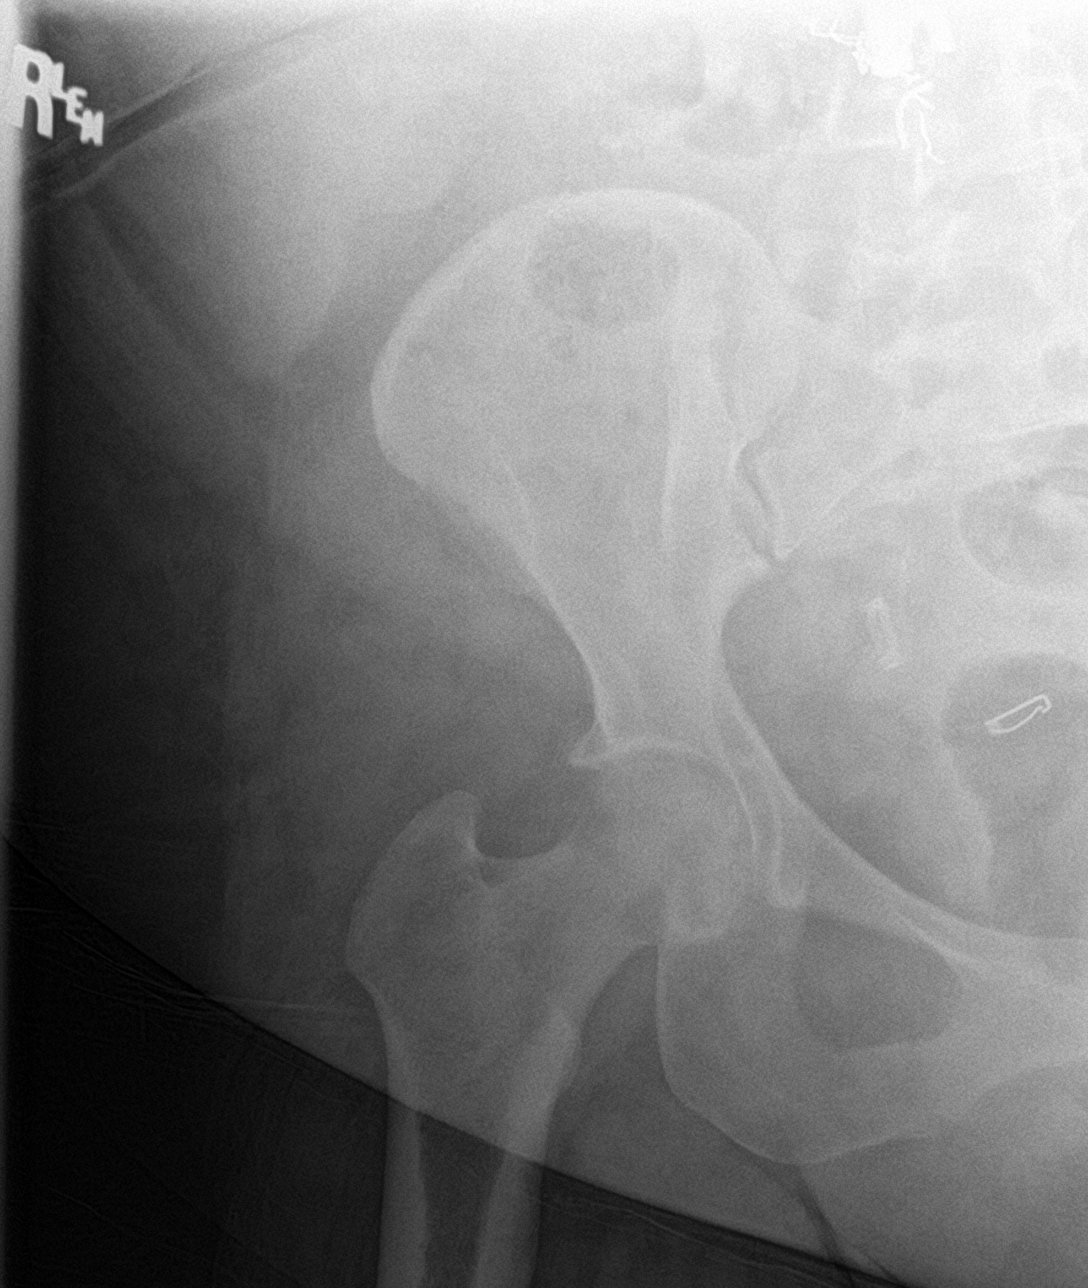

[3 of 3 positions shown; findings below may reference images not displayed]

FINDINGS: Prior lumbar spine fusion. Degenerative change lumbar spine. Mild
degenerative changes both SI joints and both hips. No evidence of
erosive arthropathy. No acute bony abnormality identified. No
evidence of fracture. Bilateral tubal ligation clips noted the
pelvis.
IMPRESSION: Prior lumbar spine fusion. Degenerative changes lumbar spine, both
SI joints, both hips. No evidence of erosive arthropathy. No acute
bony abnormality identified.

## 2019-07-15 IMAGING — CR DG HIP (WITH OR WITHOUT PELVIS) 2-3V*R*
1 series · 3 of 3 positions shown · non-contrast
Comparison: Abdomen [DATE].

CLINICAL DATA: Right hip pain.  Arthralgia.

EXAM:
DG HIP (WITH OR WITHOUT PELVIS) 2-3V RIGHT

[Series 1: dg hip unilat w or w/o pelvis 2-3 views  · non-contrast · 0.14mm/px · 3 of 3 slices shown]
[im 1/3]
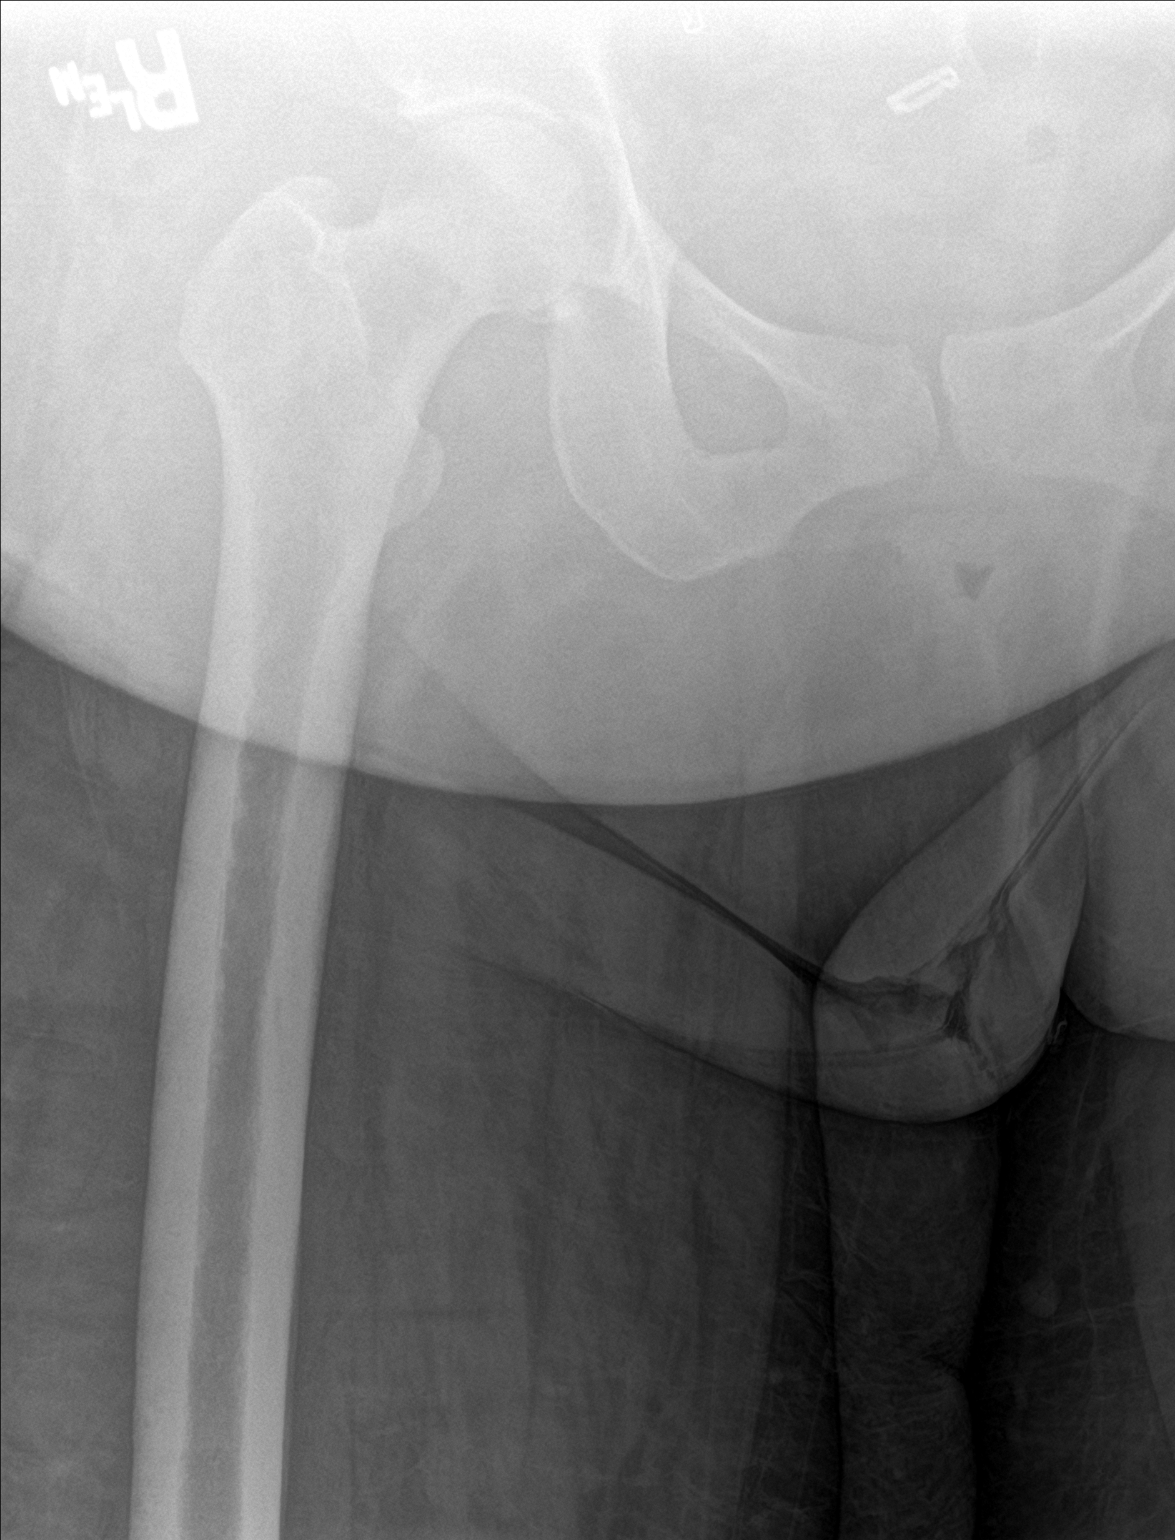
[im 2/3]
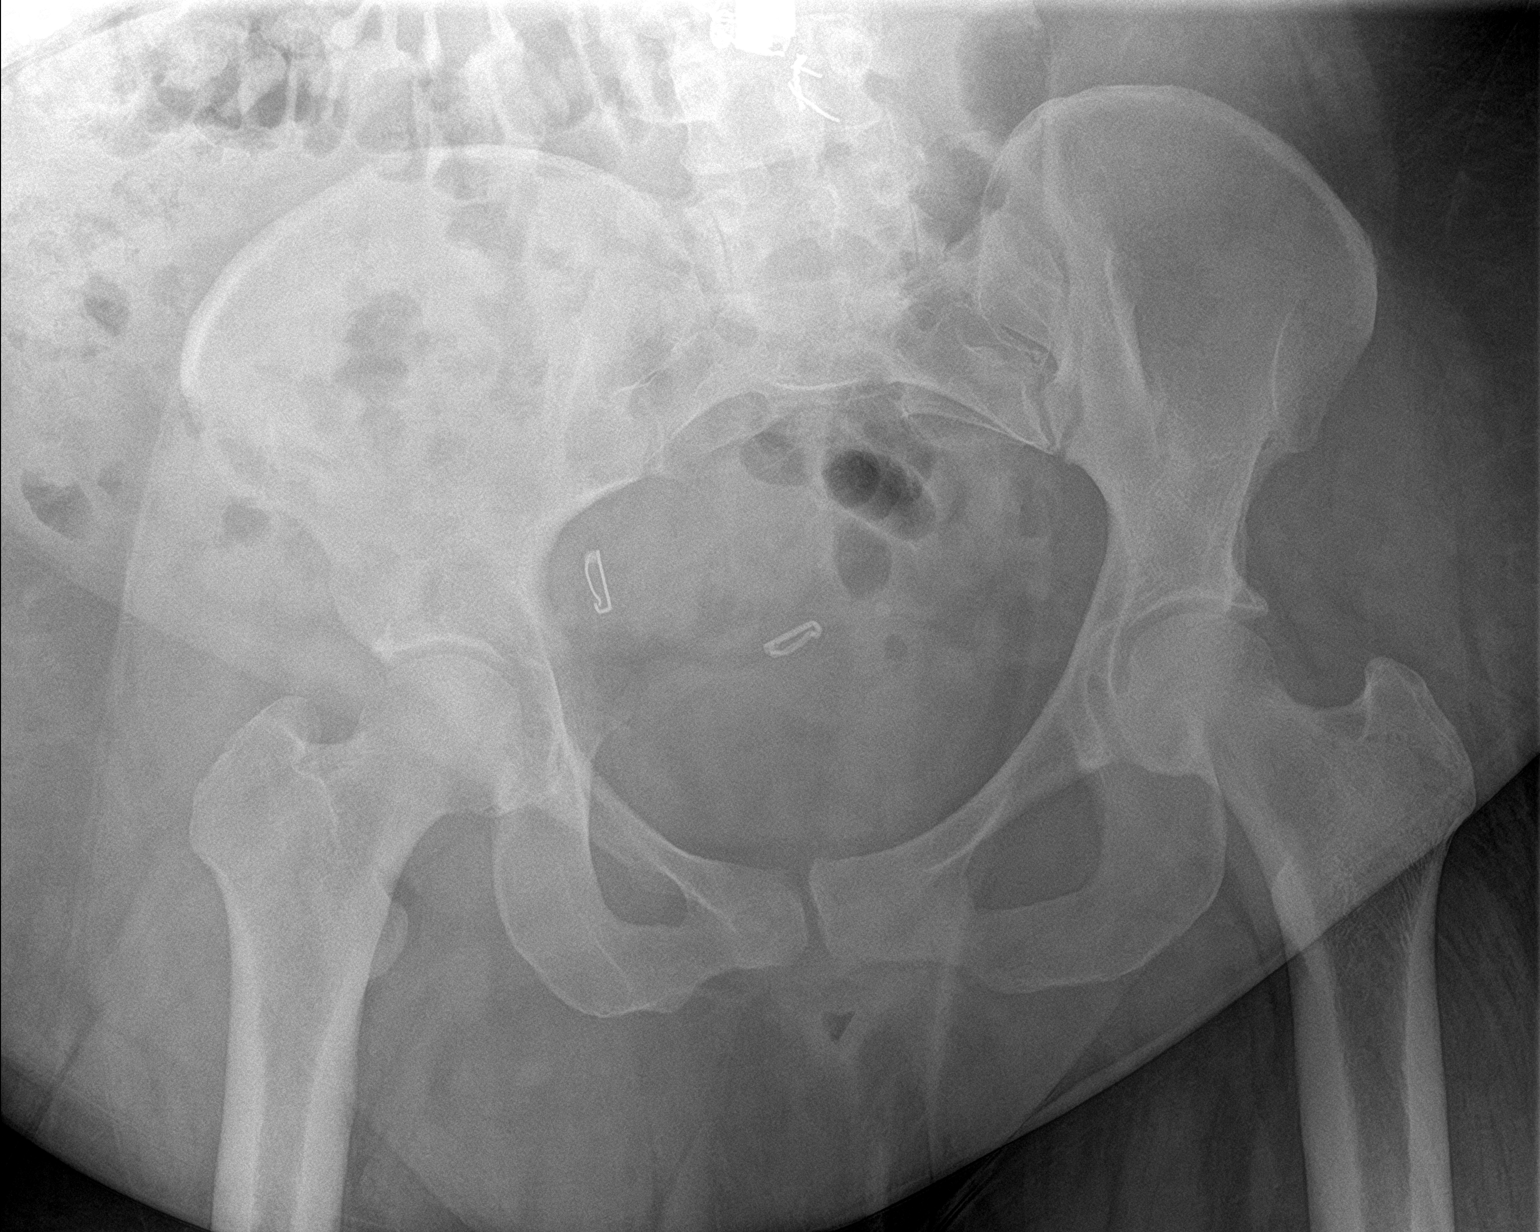
[im 3/3]
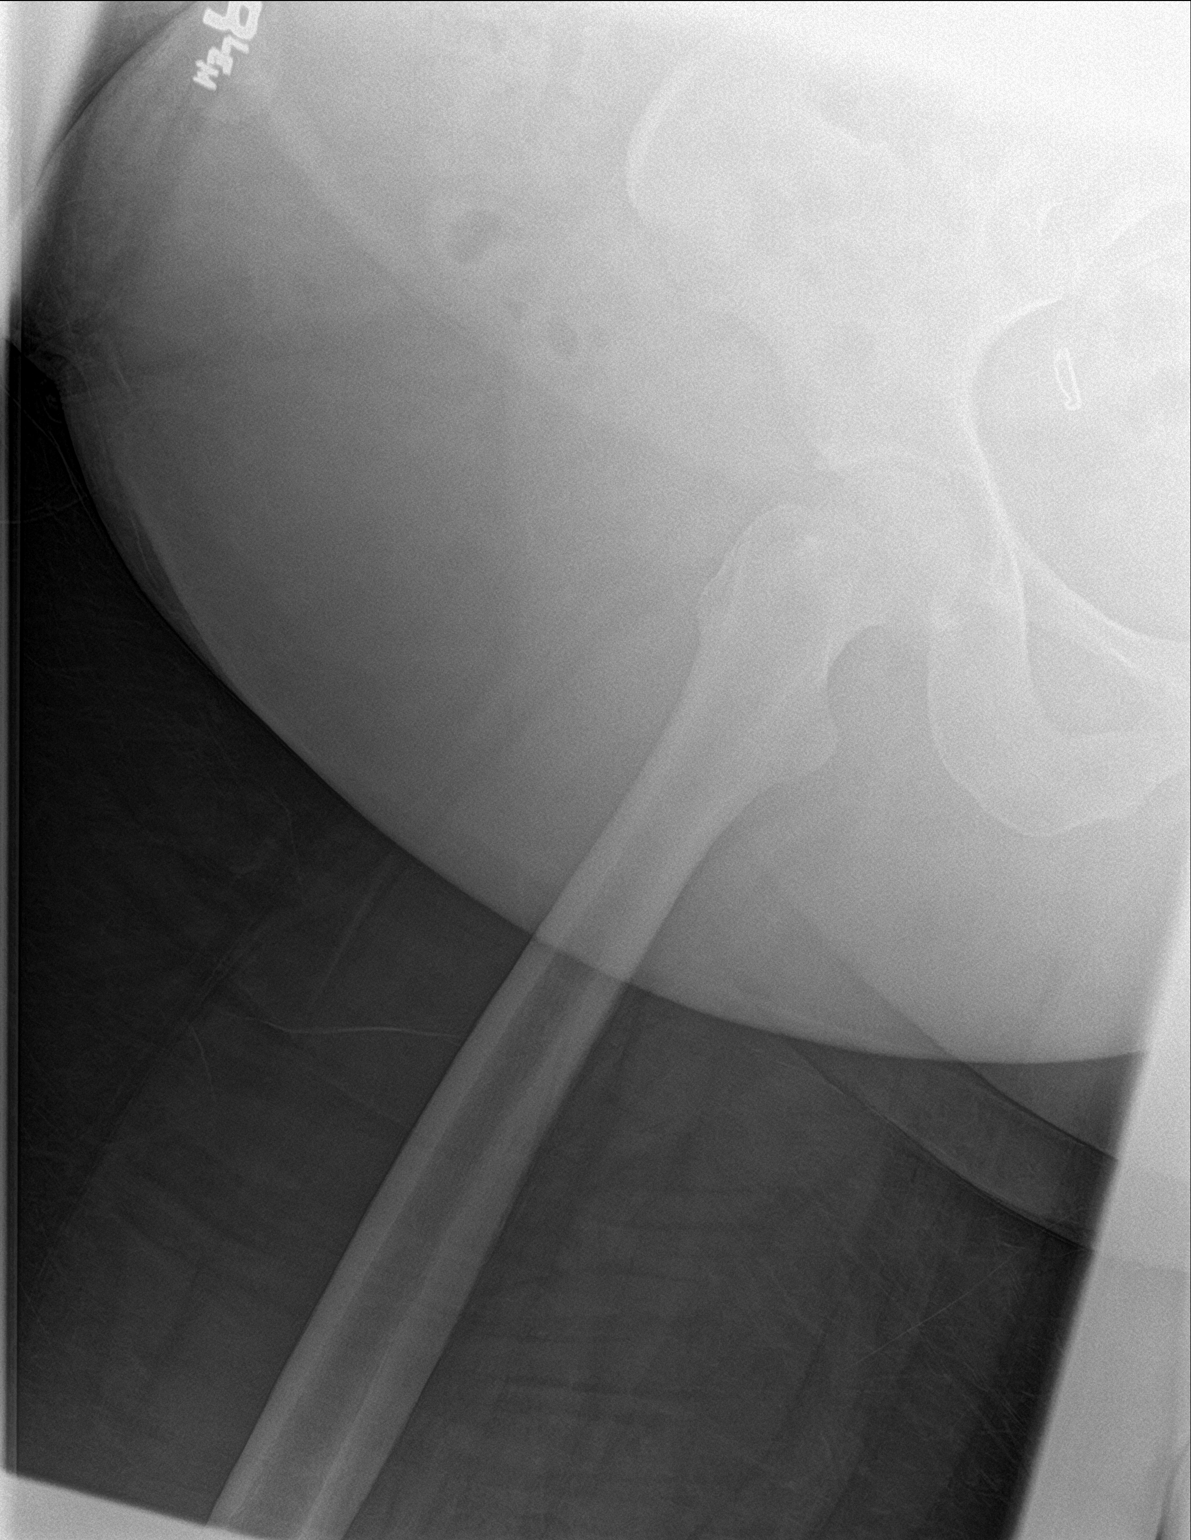

[3 of 3 positions shown; findings below may reference images not displayed]

FINDINGS: Postsurgical changes lumbar spine. Degenerative changes lumbar
spine, both SI joints, and both hips. No evidence of erosive
arthropathy. No acute abnormality identified. Right hip is intact.
Bilateral tubal ligation clips noted the pelvis.
IMPRESSION: Postsurgical changes lumbar spine. Degenerative changes lumbar
spine, both SI joints, and both hips. No evidence of erosive
arthropathy. No acute abnormality identified. Right hip is intact.

## 2019-07-15 IMAGING — CR DG LUMBAR SPINE COMPLETE W/ BEND
1 series · 6 of 6 positions shown · non-contrast
Comparison: CT [DATE].

CLINICAL DATA: Low back pain.

EXAM:
LUMBAR SPINE - COMPLETE WITH BENDING VIEWS

[Series 1: dg lumbar spine complete w/bend 6+v · 0.14mm/px · 6 of 6 slices shown]
[im 1/6]
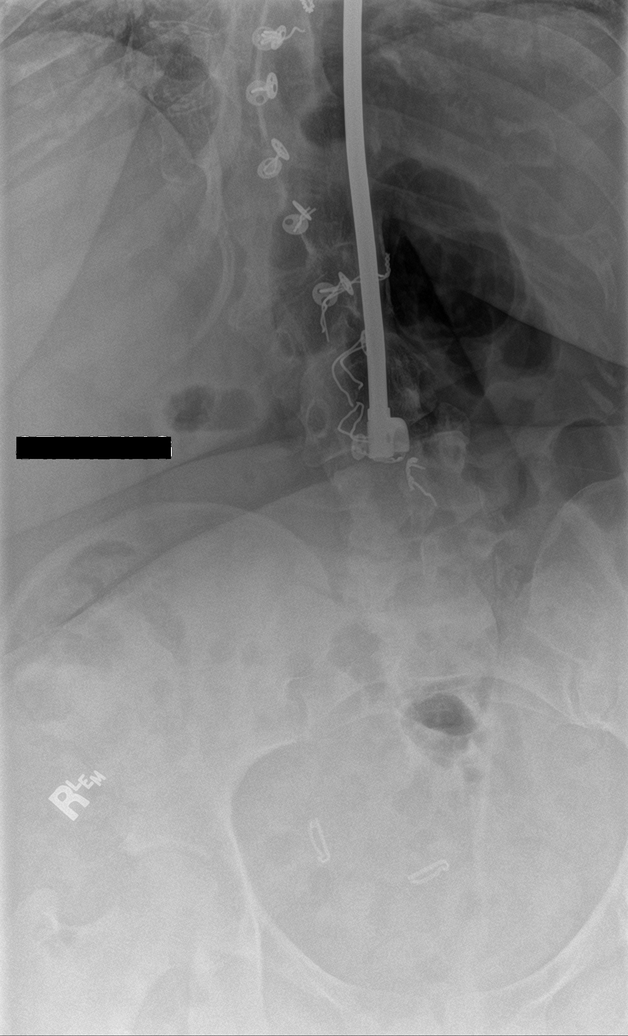
[im 2/6]
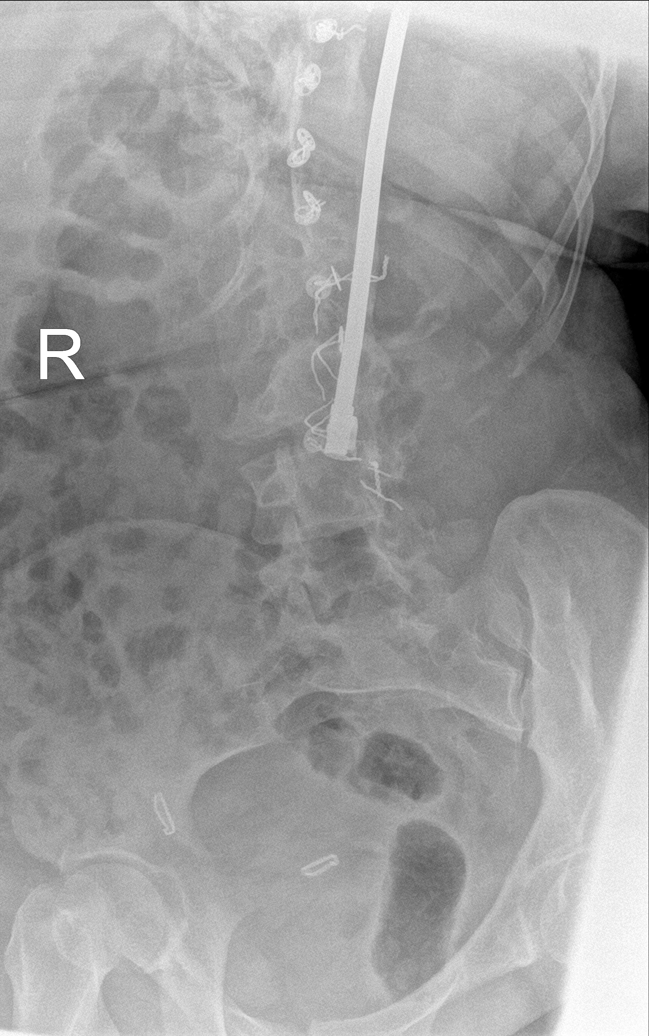
[im 3/6]
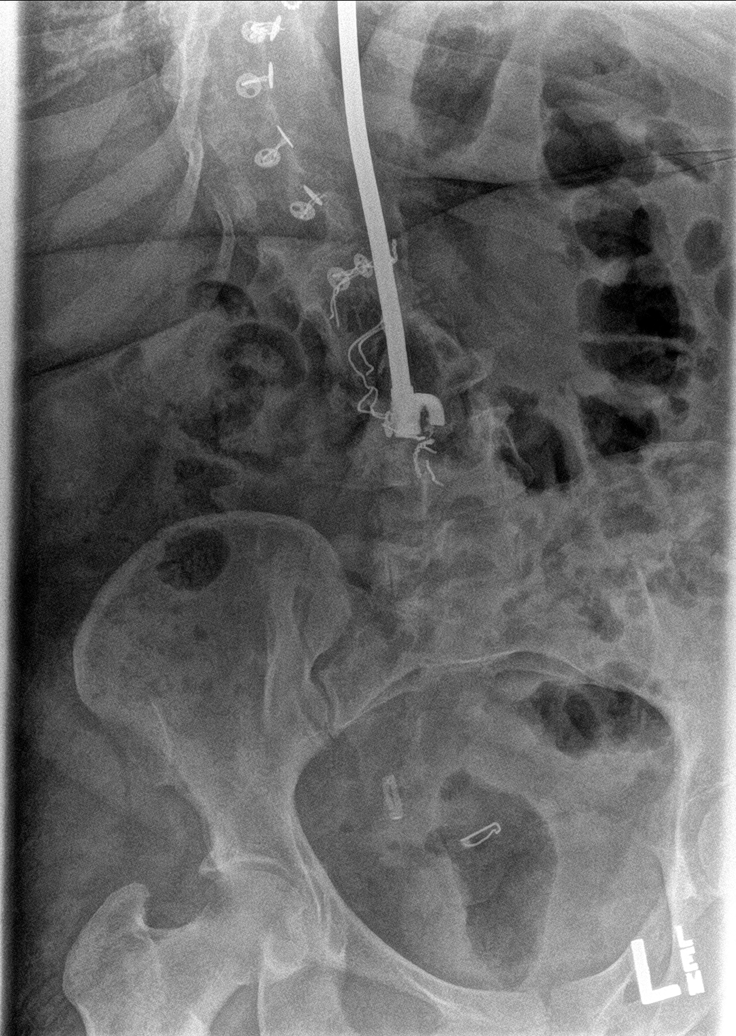
[im 4/6]
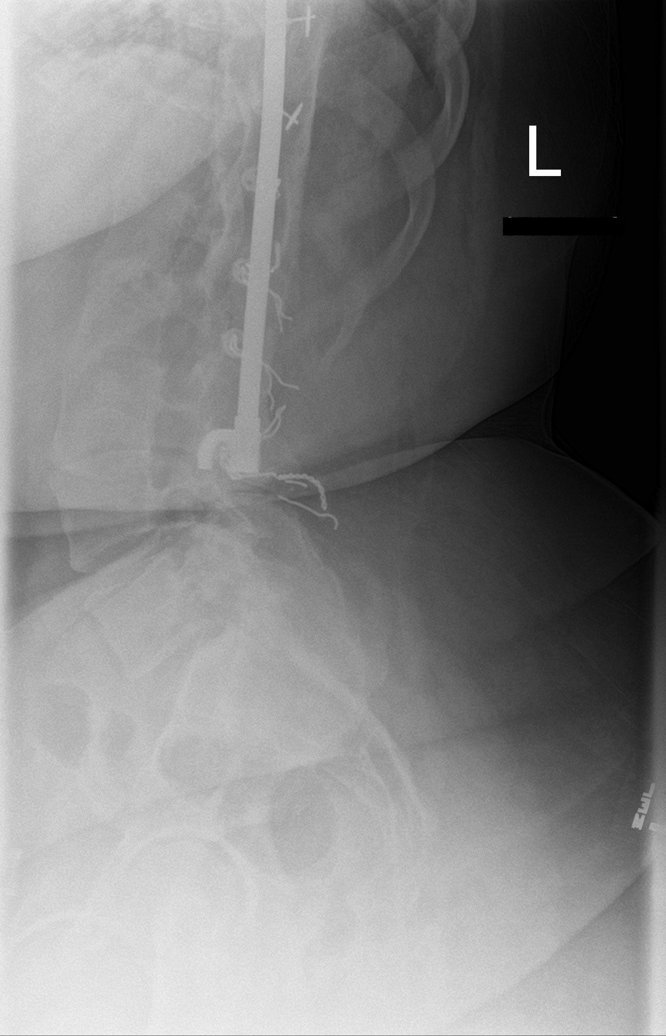
[im 5/6]
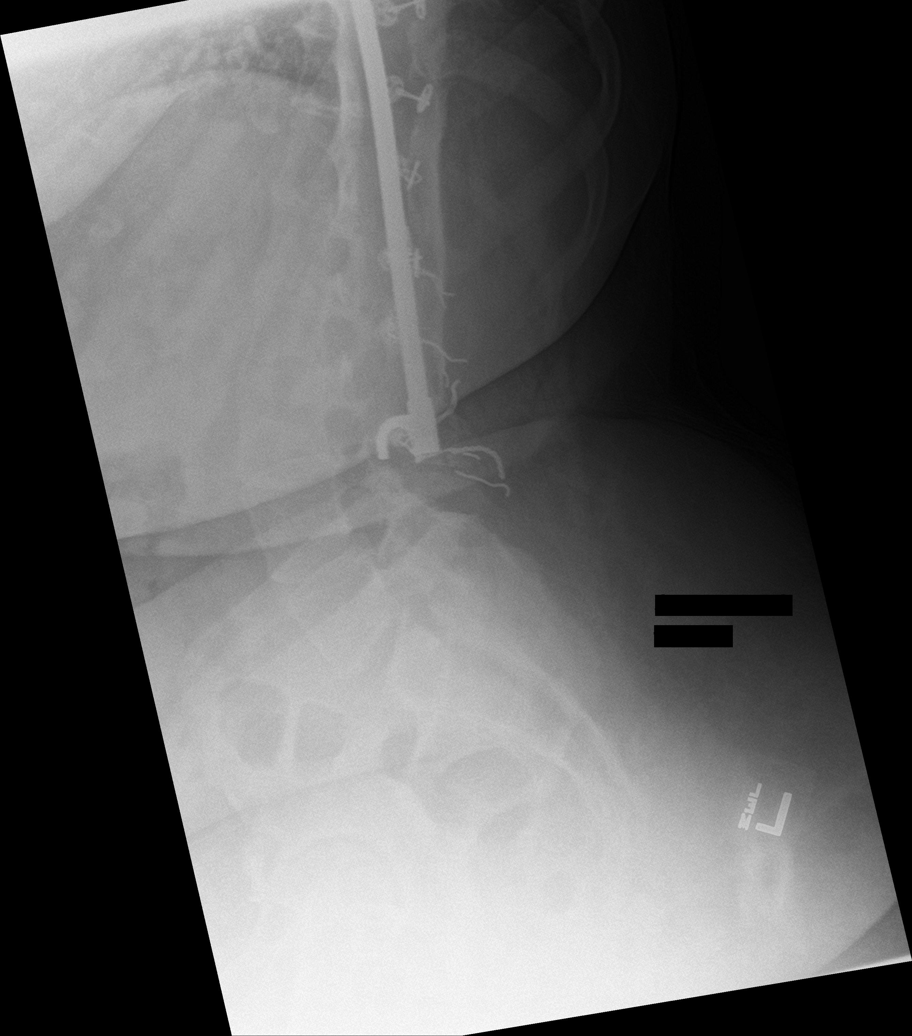
[im 6/6]
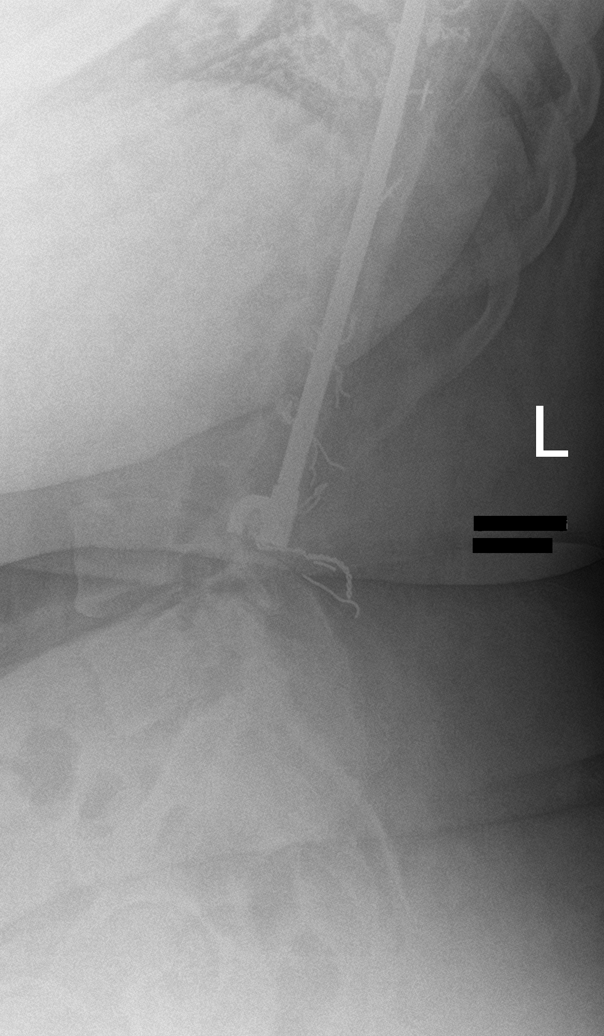

[6 of 6 positions shown; findings below may reference images not displayed]

FINDINGS: Thoracolumbar fusion again noted. Visualized hardware appears
stable. No flexion or extension abnormality identified. Severe
lumbar spine scoliosis concave left again noted. Diffuse
degenerative change. No acute bony abnormality. No evidence of
fracture. Bilateral tubal ligation clips noted in the pelvis.
IMPRESSION: 1. Thoracolumbar fusion again noted. Visualized hardware appears
stable. No flexion or extension abnormality identified.

2. Severe lumbar spine scoliosis concave left again noted. Diffuse
degenerative change lumbar spine. No acute bony abnormality
identified.

## 2019-07-19 LAB — COMPLIANCE DRUG ANALYSIS, UR

## 2019-07-24 LAB — COMP. METABOLIC PANEL (12)
AST: 12 IU/L (ref 0–40)
Albumin/Globulin Ratio: 1.2 (ref 1.2–2.2)
Albumin: 3.9 g/dL (ref 3.8–4.8)
Alkaline Phosphatase: 111 IU/L (ref 48–121)
BUN/Creatinine Ratio: 9 (ref 9–23)
BUN: 7 mg/dL (ref 6–20)
Bilirubin Total: 0.7 mg/dL (ref 0.0–1.2)
Calcium: 9.3 mg/dL (ref 8.7–10.2)
Chloride: 99 mmol/L (ref 96–106)
Creatinine, Ser: 0.74 mg/dL (ref 0.57–1.00)
GFR calc Af Amer: 119 mL/min/{1.73_m2} (ref >59)
GFR calc non Af Amer: 103 mL/min/{1.73_m2} (ref >59)
Globulin, Total: 3.3 g/dL (ref 1.5–4.5)
Glucose: 81 mg/dL (ref 65–99)
Potassium: 4.2 mmol/L (ref 3.5–5.2)
Sodium: 141 mmol/L (ref 134–144)
Total Protein: 7.2 g/dL (ref 6.0–8.5)

## 2019-07-24 LAB — HEMOGLOBIN A1C
Est. average glucose Bld gHb Est-mCnc: 111 mg/dL
Hgb A1c MFr Bld: 5.5 % (ref 4.8–5.6)

## 2019-07-24 LAB — C-REACTIVE PROTEIN: CRP: 52 mg/L — ABNORMAL HIGH (ref 0–10)

## 2019-07-24 LAB — VITAMIN B12: Vitamin B-12: 907 pg/mL (ref 232–1245)

## 2019-07-24 LAB — 25-HYDROXY VITAMIN D LCMS D2+D3
25-Hydroxy, Vitamin D-2: 29 ng/mL
25-Hydroxy, Vitamin D-3: 10 ng/mL
25-Hydroxy, Vitamin D: 39 ng/mL

## 2019-07-24 LAB — MAGNESIUM: Magnesium: 2.1 mg/dL (ref 1.6–2.3)

## 2019-07-24 LAB — SEDIMENTATION RATE: Sed Rate: 95 mm/hr — ABNORMAL HIGH (ref 0–32)

## 2019-07-29 ENCOUNTER — Other Ambulatory Visit: Payer: Self-pay | Admitting: Family

## 2019-08-05 ENCOUNTER — Encounter: Payer: Self-pay | Admitting: Pain Medicine

## 2019-08-08 NOTE — Progress Notes (Signed)
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 Lumbar facet syndrome (Bilateral) (R>L)    Problems updated and reviewed during this visit: No problems updated.  Plan of Care  Pharmacotherapy (Medications Ordered): No orders of the defined types were placed in this encounter.   Procedure Orders     LUMBAR FACET(MEDIAL BRANCH NERVE BLOCK) MBNB  Lab Orders     ANA Comprehensive Panel     Uric acid, random urine Imaging Orders  No imaging studies ordered today    Referral Orders     Ambulatory referral to Rheumatology  Pharmacological management options:  Opioid Analgesics: I will not be prescribing any opioids at this time Membrane stabilizer: None prescribed at this time Muscle relaxant: None prescribed at this time NSAID: None prescribed at this time Other analgesic(s): None prescribed at this time    Interventional management options: Planned, scheduled, and/or pending:    No procedures until BMI is below 35kg/m2.   Considering:   Diagnostic bilateral lumbar facet block #1   Possible bilateral lumbar facet RFA, once she brings her BMI below 35   PRN Procedures:   None at this time    Provider-requested follow-up: Return for Procedure (w/ sedation): (B) L-FCT BLK #1. Recent Visits Date Type Provider Dept  07/14/19 Office Visit Milinda Pointer, MD [facility]  Showing recent visits within past 90 days and meeting all other requirements Today's Visits Date Type Provider Dept  08/09/19 Office Visit Milinda Pointer, MD [facility]  Showing today's visits and meeting all other requirements Future Appointments Date Type Provider Dept  08/17/19 Appointment Milinda Pointer, MD [facility]  Showing future appointments within next 90 days and meeting all other requirements  Primary Care Physician: Ulyess Blossom, PA Note by: Gaspar Cola, MD Date: 08/09/2019; Time: 4:33 PM

## 2019-08-08 NOTE — Patient Instructions (Addendum)
______________________________________________________________________________________________  Weight Management Required  URGENT: Your weight has been found to be adversely affecting your health.  Dear [name]:  Your current Estimated body mass index is 62.73 kg/m as calculated from the following:   Height as of 07/14/19: 5' 1"  (1.549 m).   Weight as of 07/14/19: 332 lb (150.6 kg).  Please use the table below to identify your weight category and associated incidence of chronic pain, secondary to your weight.  Body Mass Index (BMI) Classification BMI level (kg/m2) Category Associated incidence of chronic pain  <18  Underweight   18.5-24.9 Ideal body weight   25-29.9 Overweight  20%  30-34.9 Obese (Class I)  68%  35-39.9 Severe obesity (Class II)  136%  >40 Extreme obesity (Class III)  254%   In addition: You will be considered "Morbidly Obese", if your BMI is above 30 and you have one or more of the following conditions which are known to be caused and/or directly associated with obesity: 1.    Type 2 Diabetes (Which in turn can lead to cardiovascular diseases (CVD), stroke, peripheral vascular diseases (PVD), retinopathy, nephropathy, and neuropathy) 2.    Cardiovascular Disease (High Blood Pressure; Congestive Heart Failure; High Cholesterol; Coronary Artery Disease; Angina; or History of Heart Attacks) 3.    Breathing problems (Asthma; obesity-hypoventilation syndrome; obstructive sleep apnea; chronic inflammatory airway disease; reactive airway disease; or shortness of breath) 4.    Chronic kidney disease 5.    Liver disease (nonalcoholic fatty liver disease) 6.    High blood pressure 7.    Acid reflux (gastroesophageal reflux disease; heartburn) 8.    Osteoarthritis (OA) (with any of the following: hip pain; knee pain; and/or low back pain) 9.    Low back pain (Lumbar Facet Syndrome; and/or Degenerative Disc Disease) 10.  Hip pain (Osteoarthritis of hip) (For every 1 lbs of  added body weight, there is a 2 lbs increase in pressure inside of each hip articulation. 1:2 mechanical relationship) 11.  Knee pain (Osteoarthritis of knee) (For every 1 lbs of added body weight, there is a 4 lbs increase in pressure inside of each knee articulation. 1:4 mechanical relationship) (patients with a BMI>30 kg/m2 were 6.8 times more likely to develop knee OA than normal-weight individuals) 12.  Cancer: Epidemiological studies have shown that obesity is a risk factor for: post-menopausal breast cancer; cancers of the endometrium, colon and kidney cancer; malignant adenomas of the oesophagus. Obese subjects have an approximately 1.5-3.5-fold increased risk of developing these cancers compared with normal-weight subjects, and it has been estimated that between 15 and 45% of these cancers can be attributed to overweight. More recent studies suggest that obesity may also increase the risk of other types of cancer, including pancreatic, hepatic and gallbladder cancer. (Ref: Obesity and cancer. Pischon T, Nthlings U, [employer] H. Proc Nutr Soc. 2008 May;67(2):128-45. doi: 25.8527/P8242353614431540.) The International Agency for Research on Cancer (IARC) has identified 13 cancers associated with overweight and obesity: meningioma, multiple myeloma, adenocarcinoma of the esophagus, and cancers of the thyroid, postmenopausal breast cancer, gallbladder, stomach, liver, pancreas, kidney, ovaries, uterus, colon and rectal (colorectal) cancers. 22 percent of all cancers diagnosed in women and 24 percent of those diagnosed in men are associated with overweight and obesity.  Recommendation: At this point it is urgent that you take a step back and concentrate in loosing weight. Dedicate 100% of your efforts on this task. Nothing else will improve your health more than bringing your weight down and your BMI to less  than 30. If you are here, you probably have chronic pain. Because most chronic pain patients have  difficulty exercising secondary to their pain, you must rely on proper nutrition and diet in order to lose the weight. If your BMI is above 40, you should seriously consider bariatric surgery. A realistic goal is to lose 10% of your body weight over a period of 12 months.  Be honest to yourself, if over time you have unsuccessfully tried to lose weight, then it is time for you to seek professional help and to enter a medically supervised weight management program, and/or undergo bariatric surgery. Stop procrastinating.   Pain management considerations:  1.    Pharmacological Problems: Be advised that the use of opioid analgesics (oxycodone; hydrocodone; morphine; methadone; codeine; and all of their derivatives) have been associated with decreased metabolism and weight gain.  For this reason, should we see that you are unable to lose weight while taking these medications, it may become necessary for [place] to taper down and indefinitely discontinue them.  2.    Technical Problems: The incidence of successful interventional therapies decreases as the patient's BMI increases. It is much more difficult to accomplish a safe and effective interventional therapy on a patient with a BMI above 35. 3.    Radiation Exposure Problems: The x-rays machine, used to accomplish injection therapies, will automatically increase their x-ray output in order to capture an appropriate bone image. This means that radiation exposure increases exponentially with the patient's BMI. (The higher the BMI, the higher the radiation exposure.) Although the level of radiation used at a given time is still safe to the patient, it is not for the physician and/or assisting staff. Unfortunately, radiation exposure is accumulative. Because physicians and the staff have to do procedures and be exposed on a daily basis, this can result in health problems such as cancer and radiation burns. Radiation exposure to the staff is monitored by the radiation  batches that they wear. The exposure levels are reported back to the staff on a quarterly basis. Depending on levels of exposure, physicians and staff may be obligated by law to decrease this exposure. This means that they have the right and obligation to refuse providing therapies where they may be overexposed to radiation. For this reason, physicians may decline to offer therapies such as radiofrequency ablation or implants to patients with a BMI above 40. 4.    Current Trends: Be advised that the current trend is to no longer offer certain therapies to patients with a BMI equal to, or above 35, due to increase perioperative risks, increased technical procedural difficulties, and excessive radiation exposure to healthcare personnel.  ______________________________________________________________________________________________    ____________________________________________________________________________________________  General Risks and Possible Complications  Patient Responsibilities: It is important that you read this as it is part of your informed consent. It is our duty to inform you of the risks and possible complications associated with treatments offered to you. It is your responsibility as a patient to read this and to ask questions about anything that is not clear or that you believe was not covered in this document.  Patient's Rights: You have the right to refuse treatment. You also have the right to change your mind, even after initially having agreed to have the treatment done. However, under this last option, if you wait until the last second to change your mind, you may be charged for the materials used up to that point.  Introduction: Medicine is not an Chief Strategy Officer.  Everything in Medicine, including the lack of treatment(s), carries the potential for danger, harm, or loss (which is by definition: Risk). In Medicine, a complication is a secondary problem, condition, or disease  that can aggravate an already existing one. All treatments carry the risk of possible complications. The fact that a side effects or complications occurs, does not imply that the treatment was conducted incorrectly. It must be clearly understood that these can happen even when everything is done following the highest safety standards.  No treatment: You can choose not to proceed with the proposed treatment alternative. The "PRO(s)" would include: avoiding the risk of complications associated with the therapy. The "CON(s)" would include: not getting any of the treatment benefits. These benefits fall under one of three categories: diagnostic; therapeutic; and/or palliative. Diagnostic benefits include: getting information which can ultimately lead to improvement of the disease or symptom(s). Therapeutic benefits are those associated with the successful treatment of the disease. Finally, palliative benefits are those related to the decrease of the primary symptoms, without necessarily curing the condition (example: decreasing the pain from a flare-up of a chronic condition, such as incurable terminal cancer).  General Risks and Complications: These are associated to most interventional treatments. They can occur alone, or in combination. They fall under one of the following six (6) categories: no benefit or worsening of symptoms; bleeding; infection; nerve damage; allergic reactions; and/or death. 1. No benefits or worsening of symptoms: In Medicine there are no guarantees, only probabilities. No healthcare provider can ever guarantee that a medical treatment will work, they can only state the probability that it may. Furthermore, there is always the possibility that the condition may worsen, either directly, or indirectly, as a consequence of the treatment. 2. Bleeding: This is more common if the patient is taking a blood thinner, either prescription or over the counter (example: Goody Powders, Fish oil,  Aspirin, Garlic, etc.), or if suffering a condition associated with impaired coagulation (example: Hemophilia, cirrhosis of the liver, low platelet counts, etc.). However, even if you do not have one on these, it can still happen. If you have any of these conditions, or take one of these drugs, make sure to notify your treating physician. 3. Infection: This is more common in patients with a compromised immune system, either due to disease (example: diabetes, cancer, human immunodeficiency virus [HIV], etc.), or due to medications or treatments (example: therapies used to treat cancer and rheumatological diseases). However, even if you do not have one on these, it can still happen. If you have any of these conditions, or take one of these drugs, make sure to notify your treating physician. 4. Nerve Damage: This is more common when the treatment is an invasive one, but it can also happen with the use of medications, such as those used in the treatment of cancer. The damage can occur to small secondary nerves, or to large primary ones, such as those in the spinal cord and brain. This damage may be temporary or permanent and it may lead to impairments that can range from temporary numbness to permanent paralysis and/or brain death. 5. Allergic Reactions: Any time a substance or material comes in contact with our body, there is the possibility of an allergic reaction. These can range from a mild skin rash (contact dermatitis) to a severe systemic reaction (anaphylactic reaction), which can result in death. 6. Death: In general, any medical intervention can result in death, most of the time due to an unforeseen complication. ____________________________________________________________________________________________  ____________________________________________________________________________________________  Preparing for Procedure with Sedation  Procedure appointments are limited to planned procedures: . No  Prescription Refills. . No disability issues will be discussed. . No medication changes will be discussed.  Instructions: . Oral Intake: Do not eat or drink anything for at least 8 hours prior to your procedure. (Exception: Blood Pressure Medication. See below.) . Transportation: Unless otherwise stated by your physician, you may drive yourself after the procedure. . Blood Pressure Medicine: Do not forget to take your blood pressure medicine with a sip of water the morning of the procedure. If your Diastolic (lower reading)is above 100 mmHg, elective cases will be cancelled/rescheduled. . Blood thinners: These will need to be stopped for procedures. Notify our staff if you are taking any blood thinners. Depending on which one you take, there will be specific instructions on how and when to stop it. . Diabetics on insulin: Notify the staff so that you can be scheduled 1st case in the morning. If your diabetes requires high dose insulin, take only  of your normal insulin dose the morning of the procedure and notify the staff that you have done so. . Preventing infections: Shower with an antibacterial soap the morning of your procedure. . Build-up your immune system: Take 1000 mg of Vitamin C with every meal (3 times a day) the day prior to your procedure. Marland Kitchen Antibiotics: Inform the staff if you have a condition or reason that requires you to take antibiotics before dental procedures. . Pregnancy: If you are pregnant, call and cancel the procedure. . Sickness: If you have a cold, fever, or any active infections, call and cancel the procedure. . Arrival: You must be in the facility at least 30 minutes prior to your scheduled procedure. . Children: Do not bring children with you. . Dress appropriately: Bring dark clothing that you would not mind if they get stained. . Valuables: Do not bring any jewelry or valuables.  Reasons to call and reschedule or cancel your procedure: (Following these  recommendations will minimize the risk of a serious complication.) . Surgeries: Avoid having procedures within 2 weeks of any surgery. (Avoid for 2 weeks before or after any surgery). . Flu Shots: Avoid having procedures within 2 weeks of a flu shots or . (Avoid for 2 weeks before or after immunizations). . Barium: Avoid having a procedure within 7-10 days after having had a radiological study involving the use of radiological contrast. (Myelograms, Barium swallow or enema study). . Heart attacks: Avoid any elective procedures or surgeries for the initial 6 months after a "Myocardial Infarction" (Heart Attack). . Blood thinners: It is imperative that you stop these medications before procedures. Let us know if you if you take any blood thinner.  . Infection: Avoid procedures during or within two weeks of an infection (including chest colds or gastrointestinal problems). Symptoms associated with infections include: Localized redness, fever, chills, night sweats or profuse sweating, burning sensation when voiding, cough, congestion, stuffiness, runny nose, sore throat, diarrhea, nausea, vomiting, cold or Flu symptoms, recent or current infections. It is specially important if the infection is over the area that we intend to treat. Marland Kitchen Heart and lung problems: Symptoms that may suggest an active cardiopulmonary problem include: cough, chest pain, breathing difficulties or shortness of breath, dizziness, ankle swelling, uncontrolled high or unusually low blood pressure, and/or palpitations. If you are experiencing any of these symptoms, cancel your procedure and contact your primary care physician for an evaluation.  Remember:  Regular Business hours  are:  Monday to Thursday 8:00 AM to 4:00 PM  Provider's Schedule: Milinda Pointer, MD:  Procedure days: Tuesday and Thursday 7:30 AM to 4:00 PM  Gillis Santa, MD:  Procedure days: Monday and Wednesday 7:30 AM to 4:00  PM ____________________________________________________________________________________________

## 2019-08-09 ENCOUNTER — Ambulatory Visit: Payer: Medicare Other | Attending: Pain Medicine | Admitting: Pain Medicine

## 2019-08-09 ENCOUNTER — Encounter: Payer: Self-pay | Admitting: Pain Medicine

## 2019-08-09 VITALS — BP 131/62 | HR 95 | Temp 97.8°F | Resp 16 | Ht 61.0 in | Wt 316.0 lb

## 2019-08-09 DIAGNOSIS — M79604 Pain in right leg: Secondary | ICD-10-CM | POA: Diagnosis present

## 2019-08-09 DIAGNOSIS — R7982 Elevated C-reactive protein (CRP): Secondary | ICD-10-CM | POA: Diagnosis present

## 2019-08-09 DIAGNOSIS — G8929 Other chronic pain: Secondary | ICD-10-CM

## 2019-08-09 DIAGNOSIS — R768 Other specified abnormal immunological findings in serum: Secondary | ICD-10-CM

## 2019-08-09 DIAGNOSIS — I5032 Chronic diastolic (congestive) heart failure: Secondary | ICD-10-CM | POA: Diagnosis present

## 2019-08-09 DIAGNOSIS — B20 Human immunodeficiency virus [HIV] disease: Secondary | ICD-10-CM | POA: Diagnosis present

## 2019-08-09 DIAGNOSIS — G894 Chronic pain syndrome: Secondary | ICD-10-CM | POA: Diagnosis present

## 2019-08-09 DIAGNOSIS — E662 Morbid (severe) obesity with alveolar hypoventilation: Secondary | ICD-10-CM | POA: Diagnosis present

## 2019-08-09 DIAGNOSIS — M47816 Spondylosis without myelopathy or radiculopathy, lumbar region: Secondary | ICD-10-CM | POA: Diagnosis present

## 2019-08-09 DIAGNOSIS — R700 Elevated erythrocyte sedimentation rate: Secondary | ICD-10-CM | POA: Diagnosis present

## 2019-08-09 DIAGNOSIS — M545 Low back pain, unspecified: Secondary | ICD-10-CM

## 2019-08-09 DIAGNOSIS — Z6841 Body Mass Index (BMI) 40.0 and over, adult: Secondary | ICD-10-CM | POA: Diagnosis present

## 2019-08-09 DIAGNOSIS — M25561 Pain in right knee: Secondary | ICD-10-CM | POA: Diagnosis present

## 2019-08-09 NOTE — Progress Notes (Signed)
Safety precautions to be maintained throughout the outpatient stay will include: orient to surroundings, keep bed in low position, maintain call bell within reach at all times, provide assistance with transfer out of bed and ambulation.  

## 2019-08-10 LAB — ANA COMPREHENSIVE PANEL
Anti JO-1: <0.2 AI (ref 0.0–0.9)
Centromere Ab Screen: 2.6 AI — ABNORMAL HIGH (ref 0.0–0.9)
Chromatin Ab SerPl-aCnc: 0.2 AI (ref 0.0–0.9)
ENA RNP Ab: 0.2 AI (ref 0.0–0.9)
ENA SM Ab Ser-aCnc: <0.2 AI (ref 0.0–0.9)
ENA SSA (RO) Ab: <0.2 AI (ref 0.0–0.9)
ENA SSB (LA) Ab: <0.2 AI (ref 0.0–0.9)
Scleroderma (Scl-70) (ENA) Antibody, IgG: <0.2 AI (ref 0.0–0.9)
dsDNA Ab: <1 IU/mL (ref 0–9)

## 2019-08-10 LAB — URIC ACID, RANDOM URINE: Uric Acid, Ur: 18.7 mg/dL

## 2019-08-11 ENCOUNTER — Ambulatory Visit
Admission: EM | Admit: 2019-08-11 | Discharge: 2019-08-11 | Disposition: A | Discharge status: Home / Self Care | Payer: Medicare Other | Attending: Family Medicine | Admitting: Family Medicine

## 2019-08-11 ENCOUNTER — Other Ambulatory Visit: Payer: Self-pay

## 2019-08-11 DIAGNOSIS — J4521 Mild intermittent asthma with (acute) exacerbation: Secondary | ICD-10-CM | POA: Diagnosis not present

## 2019-08-11 MED ORDER — ALBUTEROL SULFATE HFA 108 (90 BASE) MCG/ACT IN AERS: 1.0000 | INHALATION_SPRAY | Freq: Four times a day (QID) | RESPIRATORY_TRACT | 0 refills | Status: DC | PRN

## 2019-08-11 MED ORDER — DOXYCYCLINE HYCLATE 100 MG PO TABS
100.0000 mg | ORAL_TABLET | Freq: Two times a day (BID) | ORAL | 0 refills | Status: DC
Start: 2019-08-11 — End: 2020-05-24

## 2019-08-11 MED ORDER — PREDNISONE 20 MG PO TABS
ORAL_TABLET | ORAL | 0 refills | Status: DC
Start: 2019-08-11 — End: 2019-11-15

## 2019-08-11 NOTE — ED Triage Notes (Signed)
Patient complains of cough, chest tightness and throat feels raw from coughing since Saturday. Patient has not had the Covid Vaccines, denies fever.

## 2019-08-11 NOTE — ED Provider Notes (Signed)
[facility]    CSN: [id number] Arrival date & time: 08/11/19  1208      History   Chief Complaint Chief Complaint  Patient presents with  . Cough    HPI Tracey White is a 39 y.o. female.   39 yo female with a h/o cough and  wheezing for the past 10 days. Patient states she has a h/o asthma. Denies any fevers, chills, chest pains or shortness of breath. States has run out of her albuterol inhaler.    Cough   Past Medical History:  Diagnosis Date  . Anxiety   . CHF (congestive heart failure) (HCC)   . Chronic heart failure (HCC)   . Morbid obesity (HCC)   . OSA (obstructive sleep apnea)    uses CPAP at home  . Peripheral edema   . Scoliosis     Patient Active Problem List   Diagnosis Date Noted  . Lumbar facet syndrome (Bilateral) (R>L) 07/14/2019  . Chronic sacroiliac joint pain (Right) 07/14/2019  . Chronic lower extremity pain (2ry area of Pain) (Right) 07/14/2019  . Chronic knee pain (3ry area of Pain) (Right) 07/14/2019  . Osteoarthritis of knees (Bilateral) 07/14/2019  . Chronic hip pain (4th area of Pain) (Right) 07/14/2019  . Prediabetes 07/13/2019  . Elevated hemoglobin A1c 07/13/2019  . 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 Hypertensive heart disease 07/22/2017  . Long term current use of opiate analgesic 06/02/2017  . Goals of care, counseling/discussion 05/07/2017  . AIDS (HCC) 05/07/2017  . B12 deficiency 04/16/2017  . Chronic diastolic congestive heart failure (HCC) 04/02/2017  . Iron deficiency anemia 03/19/2017  . Positive ANA (antinuclear antibody) 02/04/2017  . Positive antinuclear antibody 02/04/2017  . 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 Pharmacologic therapy 01/08/2017  . Problems influencing health status 01/08/2017  . Chronic lower extremity pain (Bilateral) (R>L) 01/08/2017  . Elevated C-reactive protein (CRP) 01/08/2017  . Elevated sed rate 01/08/2017  . Vitamin D  deficiency 01/08/2017  . Class 3 severe obesity with serious comorbidity and body mass index (BMI) of 60.0 to 69.9 in adult (HCC) 01/08/2017  . Acute diastolic heart failure (HCC) 01/01/2017  . Anemia 12/25/2016  . Asthma without status asthmaticus 12/24/2016  . Chronic knee pain (Bilateral) (R>L) 12/12/2016  . Chronic thoracic back pain (Fourth Area of Pain) (Bilateral) (R>L) 12/12/2016  . Chronic pain syndrome 12/12/2016  . Disorder of skeletal system 12/12/2016  . Other long term (current) drug therapy 12/12/2016  . Scoliosis 12/12/2016  . Lymphedema 09/24/2016  . Skin lesion of left leg 09/05/2016  . Depression 08/27/2016  . Chronic low back pain (1ry area of Pain) (Bilateral) (R>L) w/o sciatica 08/27/2016  . Hypokalemia 06/06/2016  . Chest pain 05/31/2016  . Chronic diastolic heart failure (HCC) 05/15/2016  . Obstructive sleep apnea 05/15/2016  . Tobacco use 05/15/2016  . Sinus tachycardia 10/31/2015  . Chest pain at rest 10/18/2015    Past Surgical History:  Procedure Laterality Date  . bariatric sleeve    . HERNIA REPAIR    . scoliosis repair    . TUBAL LIGATION      OB History   No obstetric history on file.      Home Medications    Prior to Admission medications   Medication Sig Start Date End Date Taking? Authorizing Provider  bumetanide (BUMEX) 2 MG tablet Take 4 mg by mouth 2 (two) times daily.    Yes [provider]  hydrOXYzine (VISTARIL) 25 MG capsule Take 50 mg by mouth 2 (two) times daily as needed for anxiety. 2 tabs bid prn   Yes [provider]  ibuprofen (ADVIL) 800 MG tablet  06/23/19  Yes [provider]  ipratropium (ATROVENT) 0.06 % nasal spray Place 2 sprays into both nostrils 3 (three) times daily.    Yes [provider]  KLOR-CON M20 20 MEQ tablet TAKE 2 TABLETS (40 MEQ TOTAL) IN AM AND 1 TAB IN EVENING 09/02/18  Yes Hackney, Tina A, FNP  lamoTRIgine (LAMICTAL) 150 MG tablet Take 150 mg by mouth daily.   Yes  [provider]  omeprazole (PRILOSEC) 20 MG capsule Take 20 mg by mouth daily.   Yes [provider]  spironolactone (ALDACTONE) 25 MG tablet Take 25 mg by mouth daily.   Yes [provider]  traZODone (DESYREL) 100 MG tablet  07/07/19  Yes [provider]  urea (CARMOL) 40 % CREA Apply topically. 07/09/19  Yes [provider]  ursodiol (ACTIGALL) 250 MG tablet  04/25/19  Yes [provider]  Vitamin D, Ergocalciferol, (DRISDOL) 50000 units CAPS capsule Take 50,000 Units by mouth every 7 (seven) days.   Yes [provider]  albuterol (VENTOLIN HFA) 108 (90 Base) MCG/ACT inhaler Inhale 1-2 puffs into the lungs every 6 (six) hours as needed for wheezing or shortness of breath. 08/11/19   Payton Mccallum, MD  doxycycline (VIBRA-TABS) 100 MG tablet Take 1 tablet (100 mg total) by mouth 2 (two) times daily. 08/11/19   Payton Mccallum, MD  predniSONE (DELTASONE) 20 MG tablet 3 tabs po qd x 2 days, then 2 tabs po qd x 3 days, then 1 tab po qd x 3 days, then half a tab po qd x 2 days 08/11/19   Payton Mccallum, MD    Family History Family History  Problem Relation Age of Onset  . Diabetes Mother   . Hypertension Mother   . Cancer Maternal Grandmother     Social History Social History   Tobacco Use  . Smoking status: Current Every Day Smoker    Packs/day: 0.20    Types: Cigarettes    Last attempt to quit: 02/16/2017    Years since quitting: 2.4  . Smokeless tobacco: Never Used  . Tobacco comment: hasn't smoked in 3-4 months  Vaping Use  . Vaping Use: Never used  Substance Use Topics  . Alcohol use: Yes    Alcohol/week: 1.0 - 2.0 standard drink    Types: 1 - 2 Standard drinks or equivalent per week    Comment: occasionally  . Drug use: No     Allergies   Aspirin   Review of Systems Review of Systems  Respiratory: Positive for cough.      Physical Exam Triage Vital Signs ED Triage Vitals  Enc Vitals Group     BP  08/11/19 1223 (!) 135/96     Pulse Rate 08/11/19 1223 85     Resp 08/11/19 1223 18     Temp 08/11/19 1223 98.4 F (36.9 C)     Temp Source 08/11/19 1223 Oral     SpO2 08/11/19 1223 95 %     Weight 08/11/19 1223 (!) 316 lb (143.3 kg)     Height 08/11/19 1223 5\' 1"  (1.549 m)     Head Circumference --      Peak Flow --      Pain Score 08/11/19 1222 3     Pain Loc --  Pain Edu? --      Excl. in GC? --    No data found.  Updated Vital Signs BP (!) 135/96 (BP Location: Right Arm)   Pulse 85   Temp 98.4 F (36.9 C) (Oral)   Resp 18   Ht 5\' 1"  (1.549 m)   Wt (!) 143.3 kg   LMP 07/15/2019 Comment: pt states no chance of pregnancy  SpO2 95%   BMI 59.71 kg/m   Visual Acuity Right Eye Distance:   Left Eye Distance:   Bilateral Distance:    Right Eye Near:   Left Eye Near:    Bilateral Near:     Physical Exam Vitals and nursing note reviewed.  Constitutional:      General: She is not in acute distress.    Appearance: She is not toxic-appearing or diaphoretic.  HENT:     Right Ear: Tympanic membrane normal.     Left Ear: Tympanic membrane normal.     Mouth/Throat:     Pharynx: Oropharynx is clear.  Cardiovascular:     Rate and Rhythm: Normal rate and regular rhythm.     Heart sounds: Normal heart sounds.  Pulmonary:     Effort: Pulmonary effort is normal. No respiratory distress.     Breath sounds: No stridor. Wheezing (few, diffuse) and rhonchi (diffuse) present. No rales.  Neurological:     Mental Status: She is alert.      UC Treatments / Results  Labs (all labs ordered are listed, but only abnormal results are displayed) Labs Reviewed - No data to display  EKG   Radiology No results found.  Procedures Procedures (including critical care time)  Medications Ordered in UC Medications - No data to display  Initial Impression / Assessment and Plan / UC Course  I have reviewed the triage vital signs and the nursing notes.  Pertinent labs & imaging  results that were available during my care of the patient were reviewed by me and considered in my medical decision making (see chart for details).      Final Clinical Impressions(s) / UC Diagnoses   Final diagnoses:  Mild intermittent asthmatic bronchitis with acute exacerbation    ED Prescriptions    Medication Sig Dispense Auth. Provider   albuterol (VENTOLIN HFA) 108 (90 Base) MCG/ACT inhaler Inhale 1-2 puffs into the lungs every 6 (six) hours as needed for wheezing or shortness of breath. 8 g 07/17/2019, MD   predniSONE (DELTASONE) 20 MG tablet 3 tabs po qd x 2 days, then 2 tabs po qd x 3 days, then 1 tab po qd x 3 days, then half a tab po qd x 2 days 16 tablet Zamere Pasternak, Payton Mccallum, MD   doxycycline (VIBRA-TABS) 100 MG tablet Take 1 tablet (100 mg total) by mouth 2 (two) times daily. 20 tablet Pamala Hurry, MD      1. diagnosis reviewed with patient 2. rx as per orders above; reviewed possible side effects, interactions, risks and benefits  3. Follow-up prn  PDMP not reviewed this encounter.   Payton Mccallum, MD 08/11/19 [phone]

## 2019-08-17 ENCOUNTER — Ambulatory Visit: Payer: Medicare Other | Admitting: Pain Medicine

## 2019-09-13 ENCOUNTER — Other Ambulatory Visit: Payer: Self-pay | Admitting: Rheumatology

## 2019-09-13 DIAGNOSIS — R700 Elevated erythrocyte sedimentation rate: Secondary | ICD-10-CM

## 2019-09-13 DIAGNOSIS — R7982 Elevated C-reactive protein (CRP): Secondary | ICD-10-CM

## 2019-09-13 DIAGNOSIS — M2550 Pain in unspecified joint: Secondary | ICD-10-CM

## 2019-09-20 ENCOUNTER — Encounter
Admission: RE | Admit: 2019-09-20 | Discharge: 2019-09-20 | Disposition: A | Discharge status: Home / Self Care | Payer: Medicare Other | Source: Ambulatory Visit | Attending: Rheumatology | Admitting: Rheumatology

## 2019-09-20 ENCOUNTER — Other Ambulatory Visit: Payer: Self-pay

## 2019-09-20 DIAGNOSIS — M2550 Pain in unspecified joint: Secondary | ICD-10-CM | POA: Diagnosis present

## 2019-09-20 DIAGNOSIS — R700 Elevated erythrocyte sedimentation rate: Secondary | ICD-10-CM | POA: Insufficient documentation

## 2019-09-20 DIAGNOSIS — R7982 Elevated C-reactive protein (CRP): Secondary | ICD-10-CM | POA: Insufficient documentation

## 2019-09-20 IMAGING — NM NM BONE WHOLE BODY
2 series · 10 of 10 positions shown · non-contrast
Comparison: None.

CLINICAL DATA: Polyarthralgia, elevated ESR, elevated CRP, back
pain

EXAM:
NUCLEAR MEDICINE WHOLE BODY BONE SCAN
TECHNIQUE: Whole body anterior and posterior images were obtained approximately
3 hours after intravenous injection of radiopharmaceutical.
RADIOPHARMACEUTICALS:  23.88 mCi [FP] MDP IV

[Series 1000: 3 hr wholebody · 2.40mm/px · 2 of 2 frames shown]
[frame 1/2]
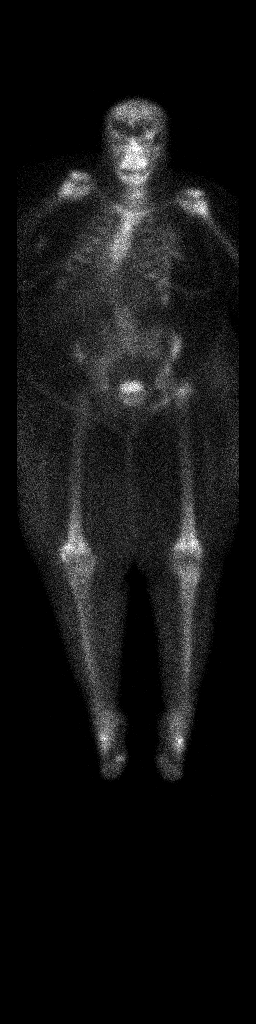
[frame 2/2]
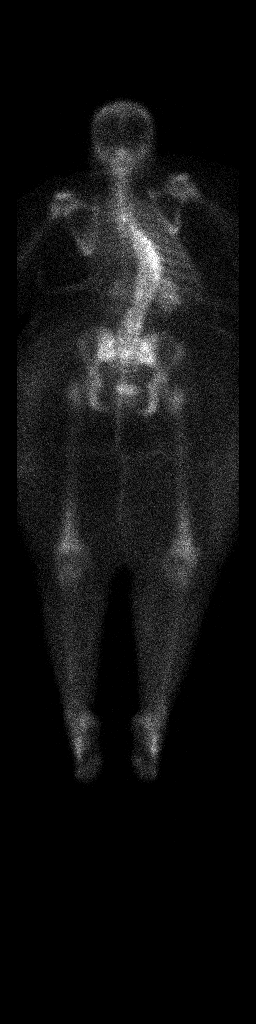

[Series 1000: statics · 2.40mm/px · 4 acquisitions, 8 frames shown]
[im 1/4]
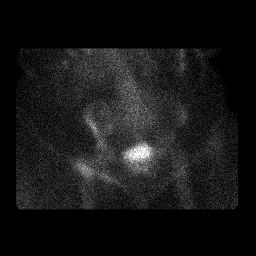
[im 1/4]
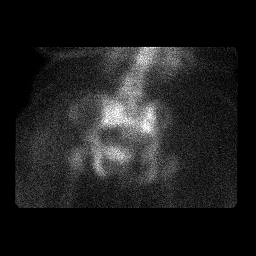
[im 2/4]
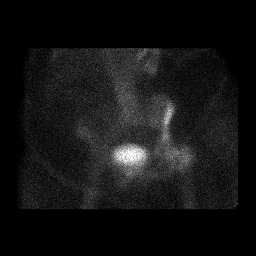
[im 2/4]
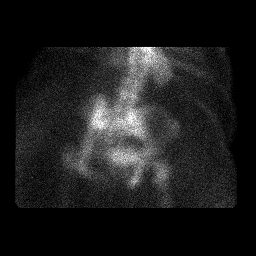
[im 3/4]
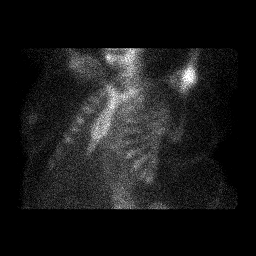
[im 3/4]
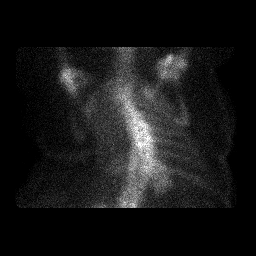
[im 4/4]
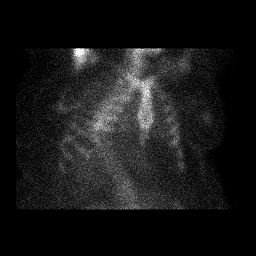
[im 4/4]
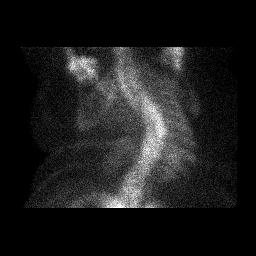

[10 of 10 positions shown; findings below may reference images not displayed]

FINDINGS: There is moderate to severe thoracolumbar dextroscoliosis with
increased uptake within the spinous processes of the mid and lower
thoracic spine which is likely postsurgical in nature. Focal uptake
within the posterior elements of the lumbar spine, shoulders, knees,
and feet bilaterally is likely degenerative in nature. There is
asymmetric uptake involving the left femoral neck, finding that can
be seen in the setting of a incomplete or stress fracture. This is
best seen on spot images of the pelvis. Intense uptake within the
sacroiliac joints bilaterally can be seen in the setting of
sacroiliitis.
IMPRESSION: Asymmetric focal uptake within the left femoral neck suspicious for
an underlying stress fracture. Asymmetric, intense uptake within the
sacroiliac joints, possibly related to underlying sacroiliitis. MRI
examination would be helpful for further evaluation of both of these
entities.

## 2019-09-20 MED ORDER — TECHNETIUM TC 99M MEDRONATE IV KIT
20.0000 | PACK | Freq: Once | INTRAVENOUS | Status: AC | PRN
  Administered 2019-09-20: 23.88 via INTRAVENOUS

## 2019-09-30 ENCOUNTER — Other Ambulatory Visit: Payer: Self-pay | Admitting: Rheumatology

## 2019-09-30 DIAGNOSIS — M461 Sacroiliitis, not elsewhere classified: Secondary | ICD-10-CM

## 2019-10-19 ENCOUNTER — Ambulatory Visit
Admission: RE | Admit: 2019-10-19 | Discharge: 2019-10-19 | Disposition: A | Discharge status: Home / Self Care | Payer: Medicare Other | Source: Ambulatory Visit | Attending: Rheumatology | Admitting: Rheumatology

## 2019-10-19 ENCOUNTER — Other Ambulatory Visit: Payer: Self-pay

## 2019-10-19 DIAGNOSIS — M461 Sacroiliitis, not elsewhere classified: Secondary | ICD-10-CM | POA: Diagnosis present

## 2019-10-19 IMAGING — MR MR PELVIS WO/W CM
5 of 8 series · 28 of 48 positions shown · IV contrast (gadavist)
Comparison: Whole-body bone scan [DATE]. Radiographs [DATE]
and pelvic CT [DATE].

CLINICAL DATA: Chronic low back pain with bilateral hip and knee
pain. No acute injury. Previous back surgery. Possible sacroiliitis.

EXAM:
MRI PELVIS WITHOUT AND WITH CONTRAST
TECHNIQUE: Multiplanar multisequence MR imaging of the pelvis was performed
both before and after administration of intravenous contrast.
CONTRAST:  10mL GADAVIST GADOBUTROL 1 MMOL/ML IV SOLN

[Series 4: T1 · axial · 4.0mm · 0.74mm/px · z∈[-15,+238]mm · 6 of 52 slices shown (1 of 2)]
[im 1/52]
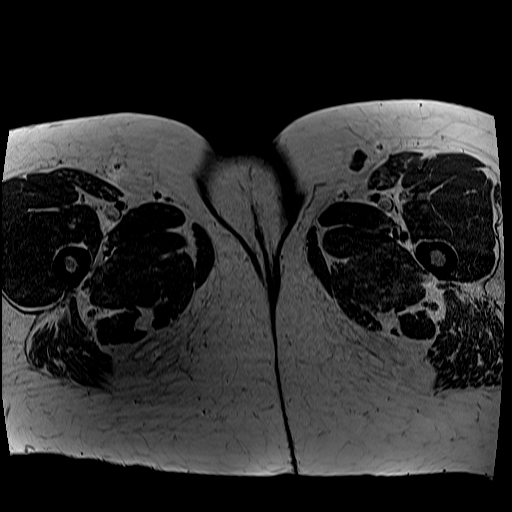
[im 11/52]
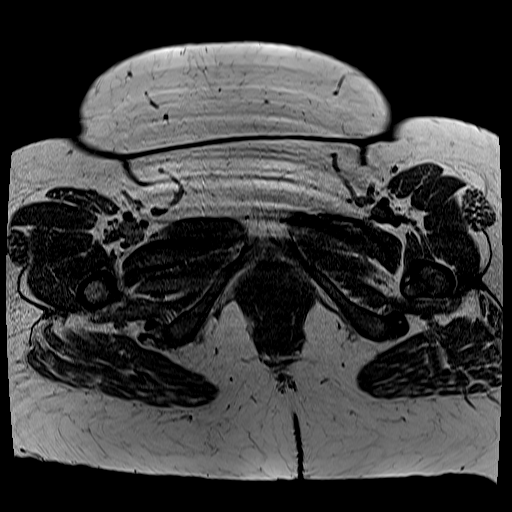
[im 21/52]
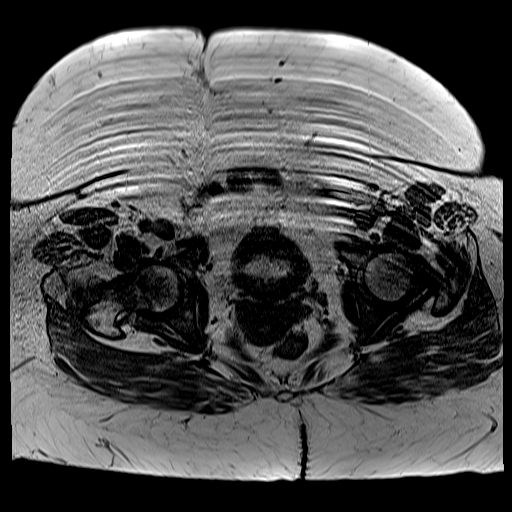
[im 31/52]
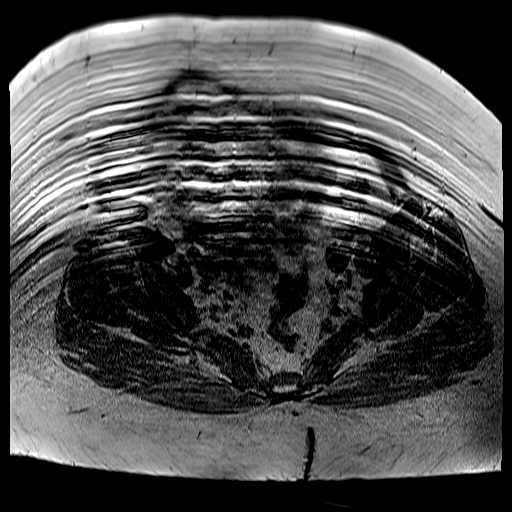
[im 41/52]
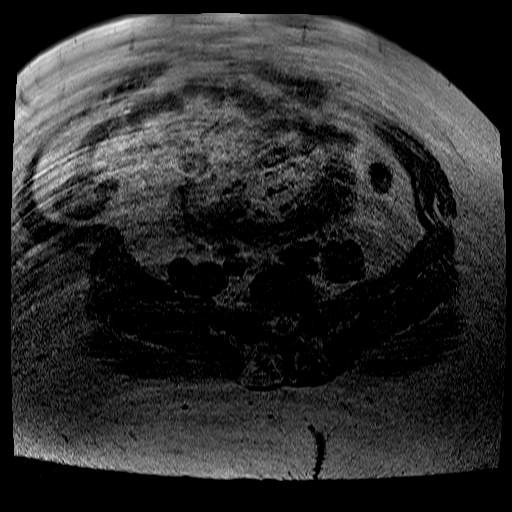
[im 52/52]
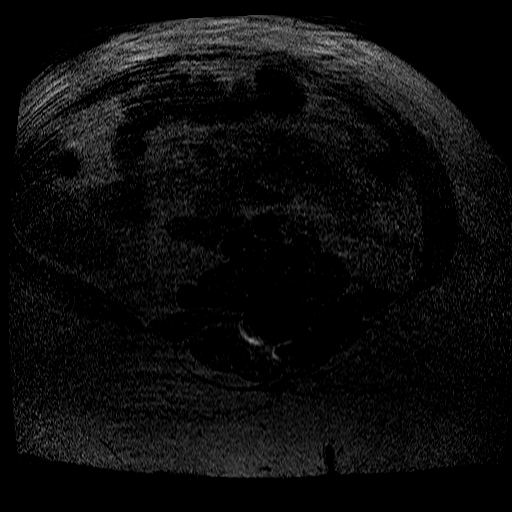

[Series 5: T2 fat-sat · axial · 4.0mm · 0.74mm/px · z∈[-15,+238]mm · 6 of 52 slices shown (1 of 2)]
[im 1/52]
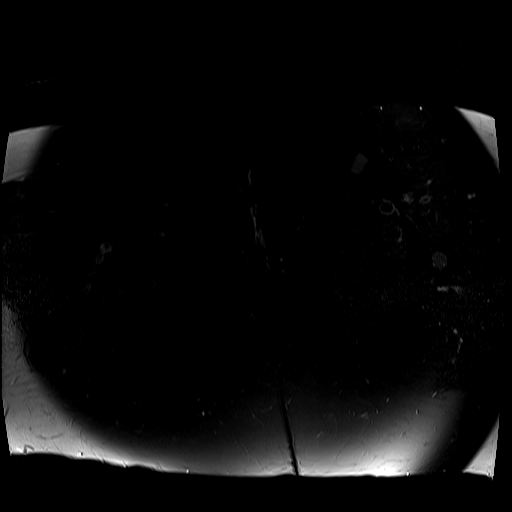
[im 11/52]
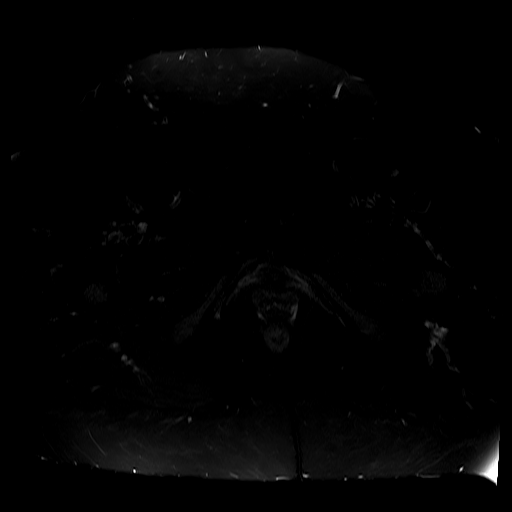
[im 21/52]
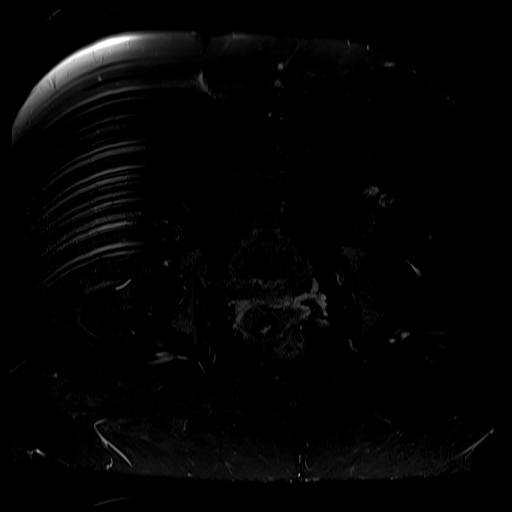
[im 31/52]
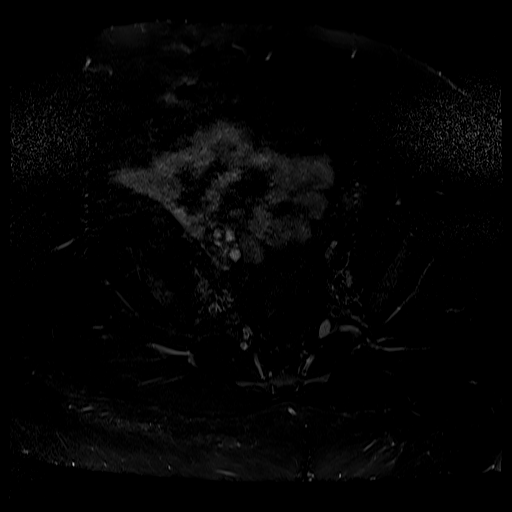
[im 41/52]
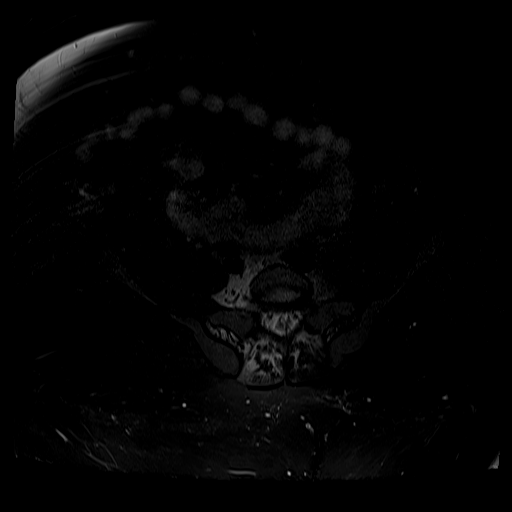
[im 52/52]
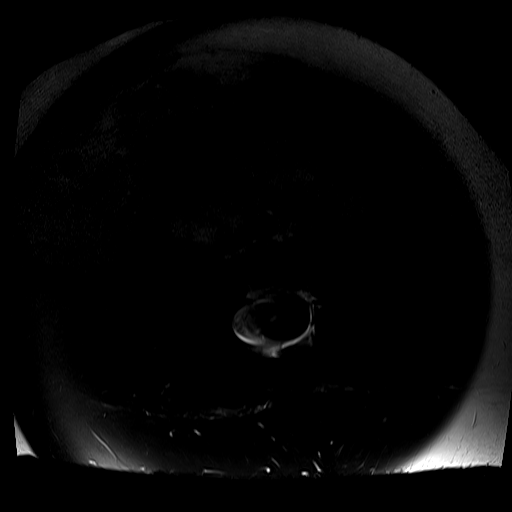

[Series 6: STIR · coronal · 4.0mm · 1.25mm/px · 5 of 40 slices shown]
[im 1/40]
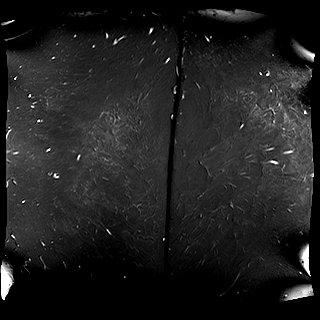
[im 10/40]
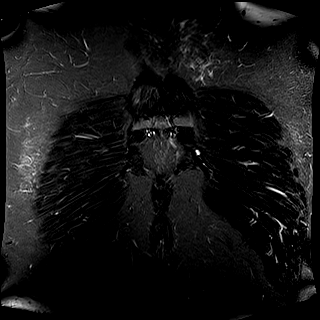
[im 20/40]
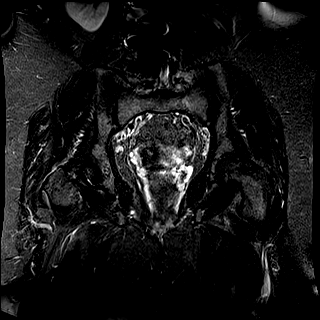
[im 30/40]
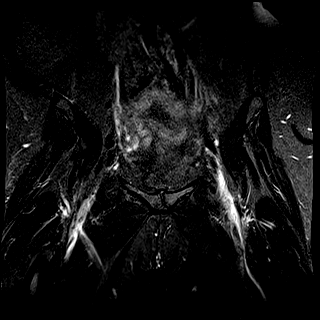
[im 40/40]
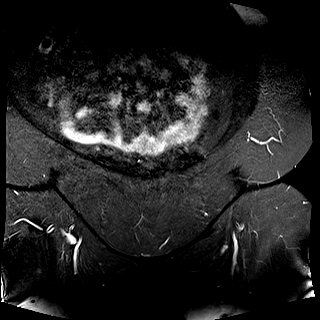

[Series 7: T1 · coronal · 4.0mm · 1.25mm/px · 5 of 40 slices shown (2 of 2)]
[im 1/40]
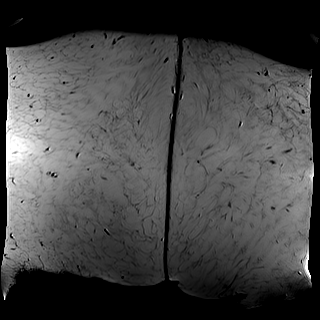
[im 10/40]
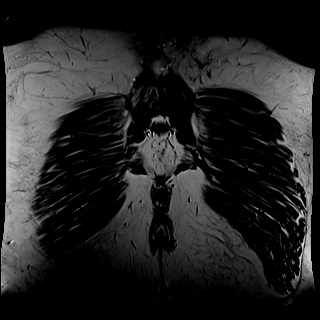
[im 20/40]
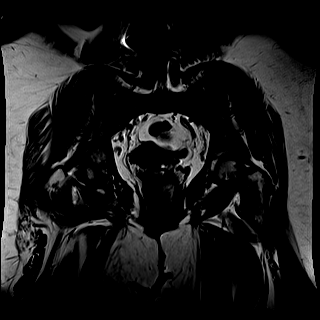
[im 30/40]
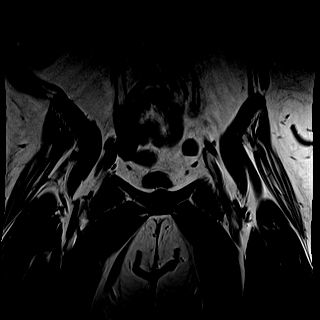
[im 40/40]
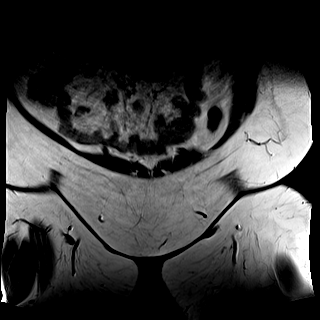

[Series 8: T2 fat-sat · sagittal · 4.0mm · 0.94mm/px · 6 of 72 slices shown (2 of 2)]
[im 1/72]
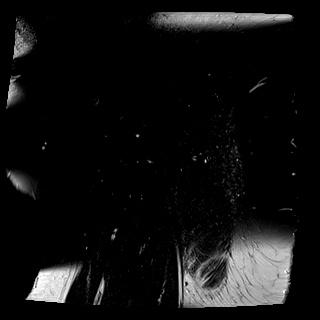
[im 9/72]
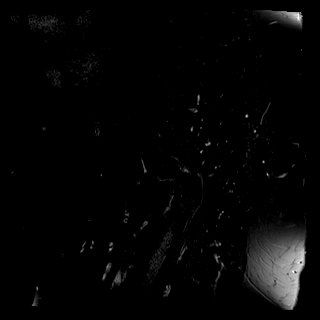
[im 18/72]
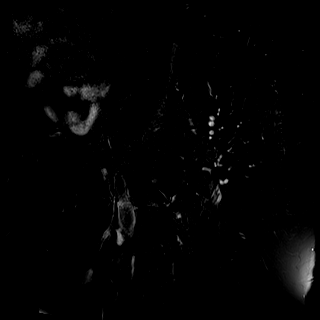
[im 27/72]
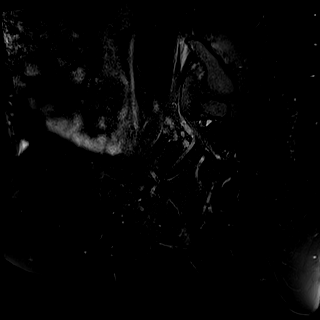
[im 45/72]
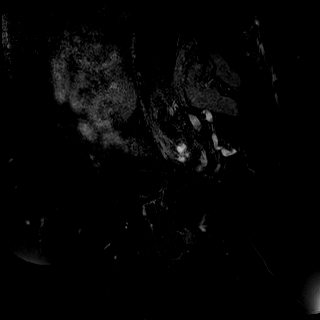
[im 54/72]
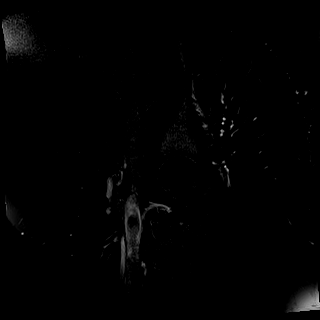

[28 of 48 positions shown; findings below may reference images not displayed]

FINDINGS: Despite efforts by the technologist and patient, mild motion
artifact is present on today's exam and could not be eliminated.
This reduces exam sensitivity and specificity.

Urinary Tract: Thick-walled urinary bladder may relate to incomplete
distension, chronic cystitis or neurogenic bladder. No surrounding
inflammatory changes. The distal ureters do not appear dilated.

Bowel: No bowel wall thickening, distention or surrounding
inflammation identified within the pelvis.

Vascular/Lymphatic: Small pelvic lymph nodes are similar to previous
study, likely reactive. No significant vascular findings.

Reproductive: Cervical nabothian cysts. The uterus and ovaries
otherwise appear normal. No adnexal mass.

Other: No pelvic ascites. Thinned anterior abdominal wall without
visible focal hernia.

Musculoskeletal: Mild sacroiliac degenerative changes bilaterally.
No erosive changes, abnormal enhancement or diastasis. Mild
degenerative changes of both hips without significant joint
effusion. There are degenerative and postsurgical changes of the
lumbar spine post rod fixation. The pelvic muscles appear symmetric
without atrophy or abnormal enhancement. No significant tendon
abnormalities.
IMPRESSION: 1. No acute findings or explanation for the patient's symptoms.
2. Mild sacroiliac degenerative changes bilaterally. No evidence of
sacroiliitis.
3. Thick-walled urinary bladder.

## 2019-10-19 MED ORDER — GADOBUTROL 1 MMOL/ML IV SOLN
10.0000 mL | Freq: Once | INTRAVENOUS | Status: AC | PRN
  Administered 2019-10-19: 10 mL via INTRAVENOUS

## 2019-10-28 ENCOUNTER — Other Ambulatory Visit: Payer: Self-pay | Admitting: Physical Medicine & Rehabilitation

## 2019-10-28 ENCOUNTER — Other Ambulatory Visit (HOSPITAL_COMMUNITY): Payer: Self-pay | Admitting: Physical Medicine & Rehabilitation

## 2019-10-28 DIAGNOSIS — G8929 Other chronic pain: Secondary | ICD-10-CM

## 2019-10-28 DIAGNOSIS — M461 Sacroiliitis, not elsewhere classified: Secondary | ICD-10-CM | POA: Insufficient documentation

## 2019-11-11 DIAGNOSIS — M45A8 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: Secondary | ICD-10-CM | POA: Insufficient documentation

## 2019-11-13 ENCOUNTER — Ambulatory Visit: Admission: RE | Admit: 2019-11-13 | Payer: Medicare Other | Source: Ambulatory Visit

## 2019-11-13 NOTE — Patient Instructions (Addendum)
______________________________________________________________________________________________  Weight Management Required  URGENT: Your weight has been found to be adversely affecting your health.  Dear [name]:  Your current Estimated body mass index is 59.71 kg/m as calculated from the following:   Height as of 08/11/19: 5' 1"  (1.549 m).   Weight as of 08/11/19: 316 lb (143.3 kg).  Please use the table below to identify your weight category and associated incidence of chronic pain, secondary to your weight.  Body Mass Index (BMI) Classification BMI level (kg/m2) Category Associated incidence of chronic pain  <18  Underweight   18.5-24.9 Ideal body weight   25-29.9 Overweight  20%  30-34.9 Obese (Class I)  68%  35-39.9 Severe obesity (Class II)  136%  >40 Extreme obesity (Class III)  254%   In addition: You will be considered "Morbidly Obese", if your BMI is above 30 and you have one or more of the following conditions which are known to be caused and/or directly associated with obesity: 1.    Type 2 Diabetes (Which in turn can lead to cardiovascular diseases (CVD), stroke, peripheral vascular diseases (PVD), retinopathy, nephropathy, and neuropathy) 2.    Cardiovascular Disease (High Blood Pressure; Congestive Heart Failure; High Cholesterol; Coronary Artery Disease; Angina; or History of Heart Attacks) 3.    Breathing problems (Asthma; obesity-hypoventilation syndrome; obstructive sleep apnea; chronic inflammatory airway disease; reactive airway disease; or shortness of breath) 4.    Chronic kidney disease 5.    Liver disease (nonalcoholic fatty liver disease) 6.    High blood pressure 7.    Acid reflux (gastroesophageal reflux disease; heartburn) 8.    Osteoarthritis (OA) (with any of the following: hip pain; knee pain; and/or low back pain) 9.    Low back pain (Lumbar Facet Syndrome; and/or Degenerative Disc Disease) 10.  Hip pain (Osteoarthritis of hip) (For every 1 lbs of  added body weight, there is a 2 lbs increase in pressure inside of each hip articulation. 1:2 mechanical relationship) 11.  Knee pain (Osteoarthritis of knee) (For every 1 lbs of added body weight, there is a 4 lbs increase in pressure inside of each knee articulation. 1:4 mechanical relationship) (patients with a BMI>30 kg/m2 were 6.8 times more likely to develop knee OA than normal-weight individuals) 12.  Cancer: Epidemiological studies have shown that obesity is a risk factor for: post-menopausal breast cancer; cancers of the endometrium, colon and kidney cancer; malignant adenomas of the oesophagus. Obese subjects have an approximately 1.5-3.5-fold increased risk of developing these cancers compared with normal-weight subjects, and it has been estimated that between 15 and 45% of these cancers can be attributed to overweight. More recent studies suggest that obesity may also increase the risk of other types of cancer, including pancreatic, hepatic and gallbladder cancer. (Ref: Obesity and cancer. Pischon T, Nthlings U, [employer] H. Proc Nutr Soc. 2008 May;67(2):128-45. doi: 19.5093/O6712458099833825.) The International Agency for Research on Cancer (IARC) has identified 13 cancers associated with overweight and obesity: meningioma, multiple myeloma, adenocarcinoma of the esophagus, and cancers of the thyroid, postmenopausal breast cancer, gallbladder, stomach, liver, pancreas, kidney, ovaries, uterus, colon and rectal (colorectal) cancers. 66 percent of all cancers diagnosed in women and 24 percent of those diagnosed in men are associated with overweight and obesity.  Recommendation: At this point it is urgent that you take a step back and concentrate in loosing weight. Dedicate 100% of your efforts on this task. Nothing else will improve your health more than bringing your weight down and your BMI to less  than 30. If you are here, you probably have chronic pain. We know that most chronic pain patients have  difficulty exercising secondary to their pain. For this reason, you must rely on proper nutrition and diet in order to lose the weight. If your BMI is above 40, you should seriously consider bariatric surgery. A realistic goal is to lose 10% of your body weight over a period of 12 months.  Be honest to yourself, if over time you have unsuccessfully tried to lose weight, then it is time for you to seek professional help and to enter a medically supervised weight management program, and/or undergo bariatric surgery. Stop procrastinating.   Pain management considerations:  1.    Pharmacological Problems: Be advised that the use of opioid analgesics (oxycodone; hydrocodone; morphine; methadone; codeine; and all of their derivatives) have been associated with decreased metabolism and weight gain.  For this reason, should we see that you are unable to lose weight while taking these medications, it may become necessary for [place] to taper down and indefinitely discontinue them.  2.    Technical Problems: The incidence of successful interventional therapies decreases as the patient's BMI increases. It is much more difficult to accomplish a safe and effective interventional therapy on a patient with a BMI above 35. 3.    Radiation Exposure Problems: The x-rays machine, used to accomplish injection therapies, will automatically increase their x-ray output in order to capture an appropriate bone image. This means that radiation exposure increases exponentially with the patient's BMI. (The higher the BMI, the higher the radiation exposure.) Although the level of radiation used at a given time is still safe to the patient, it is not for the physician and/or assisting staff. Unfortunately, radiation exposure is accumulative. Because physicians and the staff have to do procedures and be exposed on a daily basis, this can result in health problems such as cancer and radiation burns. Radiation exposure to the staff is monitored by  the radiation batches that they wear. The exposure levels are reported back to the staff on a quarterly basis. Depending on levels of exposure, physicians and staff may be obligated by law to decrease this exposure. This means that they have the right and obligation to refuse providing therapies where they may be overexposed to radiation. For this reason, physicians may decline to offer therapies such as radiofrequency ablation or implants to patients with a BMI above 40. 4.    Current Trends: Be advised that the current trend is to no longer offer certain therapies to patients with a BMI equal to, or above 35, due to increase perioperative risks, increased technical procedural difficulties, and excessive radiation exposure to healthcare personnel.  ______________________________________________________________________________________________    ____________________________________________________________________________________________  Preparing for Procedure with Sedation  Procedure appointments are limited to planned procedures: . No Prescription Refills. . No disability issues will be discussed. . No medication changes will be discussed.  Instructions: . Oral Intake: Do not eat or drink anything for at least 8 hours prior to your procedure. (Exception: Blood Pressure Medication. See below.) . Transportation: Unless otherwise stated by your physician, you may drive yourself after the procedure. . Blood Pressure Medicine: Do not forget to take your blood pressure medicine with a sip of water the morning of the procedure. If your Diastolic (lower reading)is above 100 mmHg, elective cases will be cancelled/rescheduled. . Blood thinners: These will need to be stopped for procedures. Notify our staff if you are taking any blood thinners. Depending on which  one you take, there will be specific instructions on how and when to stop it. . Diabetics on insulin: Notify the staff so that you can be  scheduled 1st case in the morning. If your diabetes requires high dose insulin, take only  of your normal insulin dose the morning of the procedure and notify the staff that you have done so. . Preventing infections: Shower with an antibacterial soap the morning of your procedure. . Build-up your immune system: Take 1000 mg of Vitamin C with every meal (3 times a day) the day prior to your procedure. Marland Kitchen Antibiotics: Inform the staff if you have a condition or reason that requires you to take antibiotics before dental procedures. . Pregnancy: If you are pregnant, call and cancel the procedure. . Sickness: If you have a cold, fever, or any active infections, call and cancel the procedure. . Arrival: You must be in the facility at least 30 minutes prior to your scheduled procedure. . Children: Do not bring children with you. . Dress appropriately: Bring dark clothing that you would not mind if they get stained. . Valuables: Do not bring any jewelry or valuables.  Reasons to call and reschedule or cancel your procedure: (Following these recommendations will minimize the risk of a serious complication.) . Surgeries: Avoid having procedures within 2 weeks of any surgery. (Avoid for 2 weeks before or after any surgery). . Flu Shots: Avoid having procedures within 2 weeks of a flu shots or . (Avoid for 2 weeks before or after immunizations). . Barium: Avoid having a procedure within 7-10 days after having had a radiological study involving the use of radiological contrast. (Myelograms, Barium swallow or enema study). . Heart attacks: Avoid any elective procedures or surgeries for the initial 6 months after a "Myocardial Infarction" (Heart Attack). . Blood thinners: It is imperative that you stop these medications before procedures. Let us know if you if you take any blood thinner.  . Infection: Avoid procedures during or within two weeks of an infection (including chest colds or gastrointestinal problems).  Symptoms associated with infections include: Localized redness, fever, chills, night sweats or profuse sweating, burning sensation when voiding, cough, congestion, stuffiness, runny nose, sore throat, diarrhea, nausea, vomiting, cold or Flu symptoms, recent or current infections. It is specially important if the infection is over the area that we intend to treat. Marland Kitchen Heart and lung problems: Symptoms that may suggest an active cardiopulmonary problem include: cough, chest pain, breathing difficulties or shortness of breath, dizziness, ankle swelling, uncontrolled high or unusually low blood pressure, and/or palpitations. If you are experiencing any of these symptoms, cancel your procedure and contact your primary care physician for an evaluation.  Remember:  Regular Business hours are:  Monday to Thursday 8:00 AM to 4:00 PM  Provider's Schedule: Milinda Pointer, MD:  Procedure days: Tuesday and Thursday 7:30 AM to 4:00 PM  Gillis Santa, MD:  Procedure days: Monday and Wednesday 7:30 AM to 4:00 PM ____________________________________________________________________________________________   ____________________________________________________________________________________________  Medication Rules  Purpose: To inform patients, and their family members, of our rules and regulations.  Applies to: All patients receiving prescriptions (written or electronic).  Pharmacy of record: Pharmacy where electronic prescriptions will be sent. If written prescriptions are taken to a different pharmacy, please inform the nursing staff. The pharmacy listed in the electronic medical record should be the one where you would like electronic prescriptions to be sent.  Electronic prescriptions: In compliance with the [place] (STOP) Act  of 2017 (Session Lanny Cramp 6384-66/Z993), effective January 28, 2018, all controlled substances must be electronically prescribed. Calling  prescriptions to the pharmacy will cease to exist.  Prescription refills: Only during scheduled appointments. Applies to all prescriptions.  NOTE: The following applies primarily to controlled substances (Opioid* Pain Medications).   Type of encounter (visit): For patients receiving controlled substances, face-to-face visits are required. (Not an option or up to the patient.)  Patient's responsibilities: 1. Pain Pills: Bring all pain pills to every appointment (except for procedure appointments). 2. Pill Bottles: Bring pills in original pharmacy bottle. Always bring the newest bottle. Bring bottle, even if empty. 3. Medication refills: You are responsible for knowing and keeping track of what medications you take and those you need refilled. The day before your appointment: write a list of all prescriptions that need to be refilled. The day of the appointment: give the list to the admitting nurse. Prescriptions will be written only during appointments. No prescriptions will be written on procedure days. If you forget a medication: it will not be "Called in", "Faxed", or "electronically sent". You will need to get another appointment to get these prescribed. No early refills. Do not call asking to have your prescription filled early. 4. Prescription Accuracy: You are responsible for carefully inspecting your prescriptions before leaving our office. Have the discharge nurse carefully go over each prescription with you, before taking them home. Make sure that your name is accurately spelled, that your address is correct. Check the name and dose of your medication to make sure it is accurate. Check the number of pills, and the written instructions to make sure they are clear and accurate. Make sure that you are given enough medication to last until your next medication refill appointment. 5. Taking Medication: Take medication as prescribed. When it comes to controlled substances, taking less pills or  less frequently than prescribed is permitted and encouraged. Never take more pills than instructed. Never take medication more frequently than prescribed.  6. Inform other Doctors: Always inform, all of your healthcare providers, of all the medications you take. 7. Pain Medication from other Providers: You are not allowed to accept any additional pain medication from any other Doctor or Healthcare provider. There are two exceptions to this rule. (see below) In the event that you require additional pain medication, you are responsible for notifying us, as stated below. 8. Medication Agreement: You are responsible for carefully reading and following our Medication Agreement. This must be signed before receiving any prescriptions from our practice. Safely store a copy of your signed Agreement. Violations to the Agreement will result in no further prescriptions. (Additional copies of our Medication Agreement are available upon request.) 9. Laws, Rules, & Regulations: All patients are expected to follow all Federal and [employer], [employer], Rules, [employer]. Ignorance of the Laws does not constitute a valid excuse.  10. Illegal drugs and Controlled Substances: The use of illegal substances (including, but not limited to marijuana and its derivatives) and/or the illegal use of any controlled substances is strictly prohibited. Violation of this rule may result in the immediate and permanent discontinuation of any and all prescriptions being written by our practice. The use of any illegal substances is prohibited. 11. Adopted CDC guidelines & recommendations: Target dosing levels will be at or below 60 MME/day. Use of benzodiazepines** is not recommended.  Exceptions: There are only two exceptions to the rule of not receiving pain medications from other Healthcare Providers. 1. Exception #1 (Emergencies): In the  event of an emergency (i.e.: accident requiring emergency care), you are allowed to receive  additional pain medication. However, you are responsible for: As soon as you are able, call our office [phone], at any time of the day or night, and leave a message stating your name, the date and nature of the emergency, and the name and dose of the medication prescribed. In the event that your call is answered by a member of our staff, make sure to document and save the date, time, and the name of the person that took your information.  2. Exception #2 (Planned Surgery): In the event that you are scheduled by another doctor or dentist to have any type of surgery or procedure, you are allowed (for a period no longer than 30 days), to receive additional pain medication, for the acute post-op pain. However, in this case, you are responsible for picking up a copy of our "Post-op Pain Management for Surgeons" handout, and giving it to your surgeon or dentist. This document is available at our office, and does not require an appointment to obtain it. Simply go to our office during business hours (Monday-Thursday from 8:00 AM to 4:00 PM) (Friday 8:00 AM to 12:00 Noon) or if you have a scheduled appointment with [place], prior to your surgery, and ask for it by name. In addition, you are responsible for: calling our office [phone], at any time of the day or night, and leaving a message stating your name, name of your surgeon, type of surgery, and date of procedure or surgery. Failure to comply with your responsibilities may result in termination of therapy involving the controlled substances.  *Opioid medications include: morphine, codeine, oxycodone, oxymorphone, hydrocodone, hydromorphone, meperidine, tramadol, tapentadol, buprenorphine, fentanyl, methadone. **Benzodiazepine medications include: diazepam (Valium), alprazolam (Xanax), clonazepam (Klonopine), lorazepam (Ativan), clorazepate (Tranxene), chlordiazepoxide (Librium), estazolam (Prosom), oxazepam (Serax), temazepam (Restoril), triazolam  (Halcion) (Last updated: 10/05/2019) ____________________________________________________________________________________________   ____________________________________________________________________________________________  Medication Recommendations and Reminders  Applies to: All patients receiving prescriptions (written and/or electronic).  Medication Rules & Regulations: These rules and regulations exist for your safety and that of others. They are not flexible and neither are we. Dismissing or ignoring them will be considered "non-compliance" with medication therapy, resulting in complete and irreversible termination of such therapy. (See document titled "Medication Rules" for more details.) In all conscience, because of safety reasons, we cannot continue providing a therapy where the patient does not follow instructions.  Pharmacy of record:   Definition: This is the pharmacy where your electronic prescriptions will be sent.   We do not endorse any particular pharmacy, however, we have experienced problems with [facility] not securing enough medication supply for the community.  We do not restrict you in your choice of pharmacy. However, once we write for your prescriptions, we will NOT be re-sending more prescriptions to fix restricted supply problems created by your pharmacy, or your insurance.   The pharmacy listed in the electronic medical record should be the one where you want electronic prescriptions to be sent.  If you choose to change pharmacy, simply notify our nursing staff.  Recommendations:  Keep all of your pain medications in a safe place, under lock and key, even if you live alone. We will NOT replace lost, stolen, or damaged medication.  After you fill your prescription, take 1 week's worth of pills and put them away in a safe place. You should keep a separate, properly labeled bottle for this purpose. The remainder should be kept in  the original bottle. Use this as  your primary supply, until it runs out. Once it's gone, then you know that you have 1 week's worth of medicine, and it is time to come in for a prescription refill. If you do this correctly, it is unlikely that you will ever run out of medicine.  To make sure that the above recommendation works, it is very important that you make sure your medication refill appointments are scheduled at least 1 week before you run out of medicine. To do this in an effective manner, make sure that you do not leave the office without scheduling your next medication management appointment. Always ask the nursing staff to show you in your prescription , when your medication will be running out. Then arrange for the receptionist to get you a return appointment, at least 7 days before you run out of medicine. Do not wait until you have 1 or 2 pills left, to come in. This is very poor planning and does not take into consideration that we may need to cancel appointments due to bad weather, sickness, or emergencies affecting our staff.  DO NOT ACCEPT A "Partial Fill": If for any reason your pharmacy does not have enough pills/tablets to completely fill or refill your prescription, do not allow for a "partial fill". The law allows the pharmacy to complete that prescription within 72 hours, without requiring a new prescription. If they do not fill the rest of your prescription within those 72 hours, you will need a separate prescription to fill the remaining amount, which we will NOT provide. If the reason for the partial fill is your insurance, you will need to talk to the pharmacist about payment alternatives for the remaining tablets, but again, DO NOT ACCEPT A PARTIAL FILL, unless you can trust your pharmacist to obtain the remainder of the pills within 72 hours.  Prescription refills and/or changes in medication(s):   Prescription refills, and/or changes in dose or medication, will be conducted only during scheduled medication  management appointments. (Applies to both, written and electronic prescriptions.)  No refills on procedure days. No medication will be changed or started on procedure days. No changes, adjustments, and/or refills will be conducted on a procedure day. Doing so will interfere with the diagnostic portion of the procedure.  No phone refills. No medications will be "called into the pharmacy".  No Fax refills.  No weekend refills.  No Holliday refills.  No after hours refills.  Remember:  Business hours are:  Monday to Thursday 8:00 AM to 4:00 PM Provider's Schedule: Milinda Pointer, MD - Appointments are:  Medication management: Monday and Wednesday 8:00 AM to 4:00 PM Procedure day: Tuesday and Thursday 7:30 AM to 4:00 PM Gillis Santa, MD - Appointments are:  Medication management: Tuesday and Thursday 8:00 AM to 4:00 PM Procedure day: Monday and Wednesday 7:30 AM to 4:00 PM (Last update: 08/18/2019) ____________________________________________________________________________________________   ____________________________________________________________________________________________  CBD (cannabidiol) WARNING  Applicable to: All individuals currently taking or considering taking CBD (cannabidiol) and, more important, all patients taking opioid analgesic controlled substances (pain medication). (Example: oxycodone; oxymorphone; hydrocodone; hydromorphone; morphine; methadone; tramadol; tapentadol; fentanyl; buprenorphine; butorphanol; dextromethorphan; meperidine; codeine; etc.)  Legal status: CBD remains a Schedule I drug prohibited for any use. CBD is illegal with one exception. In the [place], CBD has a limited Transport planner (FDA) approval for the treatment of two specific types of epilepsy disorders. Only one CBD product has been approved by the FDA for this  purpose: "Epidiolex". FDA is aware that some companies are marketing products containing cannabis and  cannabis-derived compounds in ways that violate the [employer], Drug and Cosmetic Act [facility] Act) and that may put the health and safety of consumers at risk. The FDA, a Federal agency, has not enforced the CBD status since 2018.   Legality: Some manufacturers ship CBD products nationally, which is illegal. Often such products are sold online and are therefore available throughout the country. CBD is openly sold in head shops and health food stores in some states where such sales have not been explicitly legalized. Selling unapproved products with unsubstantiated therapeutic claims is not only a violation of the law, but also can put patients at risk, as these products have not been proven to be safe or effective. Federal illegality makes it difficult to conduct research on CBD.  Reference: "FDA Regulation of Cannabis and Cannabis-Derived Products, Including Cannabidiol (CBD)" - [employer]  Warning: CBD is not FDA approved and has not undergo the same manufacturing controls as prescription drugs.  This means that the purity and safety of available CBD may be questionable. Most of the time, despite manufacturer's claims, it is contaminated with THC (delta-9-tetrahydrocannabinol - the chemical in marijuana responsible for the "HIGH").  When this is the case, the [facility] contaminant will trigger a positive urine drug screen (UDS) test for Marijuana (carboxy-THC). Because a positive UDS for any illicit substance is a violation of our medication agreement, your opioid analgesics (pain medicine) may be permanently discontinued.  MORE ABOUT CBD  General Information: CBD  is a derivative of the Marijuana (cannabis sativa) plant discovered in [birth year]. It is one of the 113 identified substances found in Marijuana. It accounts for up to 40% of the plant's extract. As of 2018, preliminary clinical studies  on CBD included research for the treatment of anxiety, movement disorders, and pain. CBD is available and consumed in multiple forms, including inhalation of smoke or vapor, as an aerosol spray, and by mouth. It may be supplied as an oil containing CBD, capsules, dried cannabis, or as a liquid solution. CBD is thought not to be as psychoactive as THC (delta-9-tetrahydrocannabinol - the chemical in marijuana responsible for the "HIGH"). Studies suggest that CBD may interact with different biological target receptors in the body, including cannabinoid and other neurotransmitter receptors. As of 2018 the mechanism of action for its biological effects has not been determined.  Side-effects  Adverse reactions: Dry mouth, diarrhea, decreased appetite, fatigue, drowsiness, malaise, weakness, sleep disturbances, and others.  Drug interactions: CBC may interact with other medications such as blood-thinners. (Last update: 09/04/2019) ____________________________________________________________________________________________   ____________________________________________________________________________________________  General Risks and Possible Complications  Patient Responsibilities: It is important that you read this as it is part of your informed consent. It is our duty to inform you of the risks and possible complications associated with treatments offered to you. It is your responsibility as a patient to read this and to ask questions about anything that is not clear or that you believe was not covered in this document.  Patient's Rights: You have the right to refuse treatment. You also have the right to change your mind, even after initially having agreed to have the treatment done. However, under this last option, if you wait until the last second to change your mind, you may be charged for the materials used up to that point.  Introduction: Medicine is not an Chief Strategy Officer. Everything in Medicine,  including the lack of treatment(s), carries the  potential for danger, harm, or loss (which is by definition: Risk). In Medicine, a complication is a secondary problem, condition, or disease that can aggravate an already existing one. All treatments carry the risk of possible complications. The fact that a side effects or complications occurs, does not imply that the treatment was conducted incorrectly. It must be clearly understood that these can happen even when everything is done following the highest safety standards.  No treatment: You can choose not to proceed with the proposed treatment alternative. The "PRO(s)" would include: avoiding the risk of complications associated with the therapy. The "CON(s)" would include: not getting any of the treatment benefits. These benefits fall under one of three categories: diagnostic; therapeutic; and/or palliative. Diagnostic benefits include: getting information which can ultimately lead to improvement of the disease or symptom(s). Therapeutic benefits are those associated with the successful treatment of the disease. Finally, palliative benefits are those related to the decrease of the primary symptoms, without necessarily curing the condition (example: decreasing the pain from a flare-up of a chronic condition, such as incurable terminal cancer).  General Risks and Complications: These are associated to most interventional treatments. They can occur alone, or in combination. They fall under one of the following six (6) categories: no benefit or worsening of symptoms; bleeding; infection; nerve damage; allergic reactions; and/or death. 1. No benefits or worsening of symptoms: In Medicine there are no guarantees, only probabilities. No healthcare provider can ever guarantee that a medical treatment will work, they can only state the probability that it may. Furthermore, there is always the possibility that the condition may worsen, either directly, or indirectly, as  a consequence of the treatment. 2. Bleeding: This is more common if the patient is taking a blood thinner, either prescription or over the counter (example: Goody Powders, Fish oil, Aspirin, Garlic, etc.), or if suffering a condition associated with impaired coagulation (example: Hemophilia, cirrhosis of the liver, low platelet counts, etc.). However, even if you do not have one on these, it can still happen. If you have any of these conditions, or take one of these drugs, make sure to notify your treating physician. 3. Infection: This is more common in patients with a compromised immune system, either due to disease (example: diabetes, cancer, human immunodeficiency virus [HIV], etc.), or due to medications or treatments (example: therapies used to treat cancer and rheumatological diseases). However, even if you do not have one on these, it can still happen. If you have any of these conditions, or take one of these drugs, make sure to notify your treating physician. 4. Nerve Damage: This is more common when the treatment is an invasive one, but it can also happen with the use of medications, such as those used in the treatment of cancer. The damage can occur to small secondary nerves, or to large primary ones, such as those in the spinal cord and brain. This damage may be temporary or permanent and it may lead to impairments that can range from temporary numbness to permanent paralysis and/or brain death. 5. Allergic Reactions: Any time a substance or material comes in contact with our body, there is the possibility of an allergic reaction. These can range from a mild skin rash (contact dermatitis) to a severe systemic reaction (anaphylactic reaction), which can result in death. 6. Death: In general, any medical intervention can result in death, most of the time due to an unforeseen complication. ____________________________________________________________________________________________  Facet  Blocks Patient Information  Description: The facets are joints  in the spine between the vertebrae.  Like any joints in the body, facets can become irritated and painful.  Arthritis can also effect the facets.  By injecting steroids and local anesthetic in and around these joints, we can temporarily block the nerve supply to them.  Steroids act directly on irritated nerves and tissues to reduce selling and inflammation which often leads to decreased pain.  Facet blocks may be done anywhere along the spine from the neck to the low back depending upon the location of your pain.   After numbing the skin with local anesthetic (like Novocaine), a small needle is passed onto the facet joints under x-ray guidance.  You may experience a sensation of pressure while this is being done.  The entire block usually lasts about 15-25 minutes.   Conditions which may be treated by facet blocks:   Low back/buttock pain  Neck/shoulder pain  Certain types of headaches  Preparation for the injection:  1. Do not eat any solid food or dairy products within 8 hours of your appointment. 2. You may drink clear liquid up to 3 hours before appointment.  Clear liquids include water, black coffee, juice or soda.  No milk or cream please. 3. You may take your regular medication, including pain medications, with a sip of water before your appointment.  Diabetics should hold regular insulin (if taken separately) and take 1/2 normal NPH dose the morning of the procedure.  Carry some sugar containing items with you to your appointment. 4. A driver must accompany you and be prepared to drive you home after your procedure. 5. Bring all your current medications with you. 6. An IV may be inserted and sedation may be given at the discretion of the physician. 7. A blood pressure cuff, EKG and other monitors will often be applied during the procedure.  Some patients may need to have extra oxygen administered for a short period. 8. You  will be asked to provide medical information, including your allergies and medications, prior to the procedure.  We must know immediately if you are taking blood thinners (like Coumadin/Warfarin) or if you are allergic to IV iodine contrast (dye).  We must know if you could possible be pregnant.  Possible side-effects:   Bleeding from needle site  Infection (rare, may require surgery)  Nerve injury (rare)  Numbness & tingling (temporary)  Difficulty urinating (rare, temporary)  Spinal headache (a headache worse with upright posture)  Light-headedness (temporary)  Pain at injection site (serveral days)  Decreased blood pressure (rare, temporary)  Weakness in arm/leg (temporary)  Pressure sensation in back/neck (temporary)   Call if you experience:   Fever/chills associated with headache or increased back/neck pain  Headache worsened by an upright position  New onset, weakness or numbness of an extremity below the injection site  Hives or difficulty breathing (go to the emergency room)  Inflammation or drainage at the injection site(s)  Severe back/neck pain greater than usual  New symptoms which are concerning to you  Please note:  Although the local anesthetic injected can often make your back or neck feel good for several hours after the injection, the pain will likely return. It takes 3-7 days for steroids to work.  You may not notice any pain relief for at least one week.  If effective, we will often do a series of 2-3 injections spaced 3-6 weeks apart to maximally decrease your pain.  After the initial series, you may be a candidate for a more permanent  nerve block of the facets.  If you have any questions, please call #[phone]) [facility]
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
## 2019-11-15 ENCOUNTER — Ambulatory Visit: Payer: Medicare Other | Attending: Pain Medicine | Admitting: Pain Medicine

## 2019-11-15 ENCOUNTER — Encounter: Payer: Self-pay | Admitting: Pain Medicine

## 2019-11-15 ENCOUNTER — Other Ambulatory Visit: Payer: Self-pay

## 2019-11-15 VITALS — BP 144/93 | HR 92 | Temp 97.3°F | Resp 18 | Ht 61.0 in | Wt 286.0 lb

## 2019-11-15 DIAGNOSIS — G894 Chronic pain syndrome: Secondary | ICD-10-CM | POA: Insufficient documentation

## 2019-11-15 DIAGNOSIS — M545 Low back pain, unspecified: Secondary | ICD-10-CM | POA: Diagnosis not present

## 2019-11-15 DIAGNOSIS — M961 Postlaminectomy syndrome, not elsewhere classified: Secondary | ICD-10-CM | POA: Diagnosis not present

## 2019-11-15 DIAGNOSIS — Z79899 Other long term (current) drug therapy: Secondary | ICD-10-CM | POA: Diagnosis present

## 2019-11-15 DIAGNOSIS — E662 Morbid (severe) obesity with alveolar hypoventilation: Secondary | ICD-10-CM

## 2019-11-15 DIAGNOSIS — B20 Human immunodeficiency virus [HIV] disease: Secondary | ICD-10-CM

## 2019-11-15 DIAGNOSIS — G8929 Other chronic pain: Secondary | ICD-10-CM | POA: Diagnosis present

## 2019-11-15 DIAGNOSIS — M47816 Spondylosis without myelopathy or radiculopathy, lumbar region: Secondary | ICD-10-CM | POA: Insufficient documentation

## 2019-11-15 MED ORDER — TRAMADOL HCL 50 MG PO TABS: 50.0000 mg | ORAL_TABLET | Freq: Four times a day (QID) | ORAL | 0 refills | Status: DC | PRN

## 2019-11-15 NOTE — Progress Notes (Signed)
Safety precautions to be maintained throughout the outpatient stay will include: orient to surroundings, keep bed in low position, maintain call bell within reach at all times, provide assistance with transfer out of bed and ambulation.  

## 2019-11-16 ENCOUNTER — Telehealth: Payer: Self-pay | Admitting: *Deleted

## 2019-11-23 ENCOUNTER — Ambulatory Visit: Payer: Medicare Other | Admitting: Pain Medicine

## 2019-11-23 DIAGNOSIS — M5137 Other intervertebral disc degeneration, lumbosacral region: Secondary | ICD-10-CM | POA: Insufficient documentation

## 2019-11-23 DIAGNOSIS — M47817 Spondylosis without myelopathy or radiculopathy, lumbosacral region: Secondary | ICD-10-CM | POA: Insufficient documentation

## 2019-11-23 NOTE — Progress Notes (Deleted)
No show

## 2019-11-29 ENCOUNTER — Ambulatory Visit: Admission: RE | Admit: 2019-11-29 | Payer: Medicare Other | Source: Ambulatory Visit

## 2020-01-18 ENCOUNTER — Encounter: Payer: Self-pay | Admitting: Physical Medicine & Rehabilitation

## 2020-01-18 ENCOUNTER — Ambulatory Visit
Admission: RE | Admit: 2020-01-18 | Discharge: 2020-01-18 | Disposition: A | Discharge status: Home / Self Care | Payer: Medicare Other | Source: Ambulatory Visit | Attending: Physical Medicine & Rehabilitation | Admitting: Physical Medicine & Rehabilitation

## 2020-01-18 ENCOUNTER — Other Ambulatory Visit: Payer: Self-pay

## 2020-01-18 DIAGNOSIS — G8929 Other chronic pain: Secondary | ICD-10-CM | POA: Insufficient documentation

## 2020-01-18 DIAGNOSIS — M5441 Lumbago with sciatica, right side: Secondary | ICD-10-CM | POA: Diagnosis not present

## 2020-01-18 IMAGING — MR MR LUMBAR SPINE W/O CM
5 of 6 series · 35 of 48 positions shown · non-contrast
Comparison: Lumbar spine radiographs [DATE]

CLINICAL DATA: Lumbar spine surgery x2. Low back pain extending the
right with progression. Patient is no longer getting relief from
injections.

EXAM:
MRI LUMBAR SPINE WITHOUT CONTRAST
TECHNIQUE: Multiplanar, multisequence MR imaging of the lumbar spine was
performed. No intravenous contrast was administered.

[Series 15: T1 · coronal · 5.0mm · 1.41mm/px · 6 of 19 slices shown (1 of 3)]
[im 1/19]
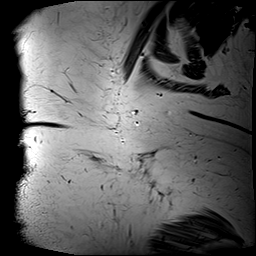
[im 4/19]
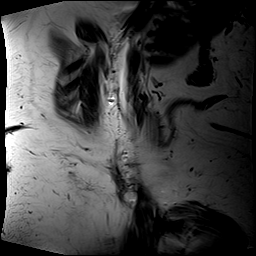
[im 8/19]
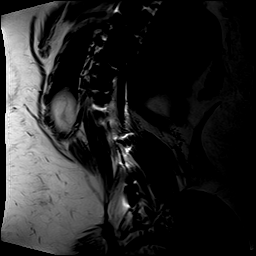
[im 11/19]
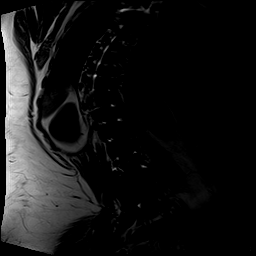
[im 15/19]
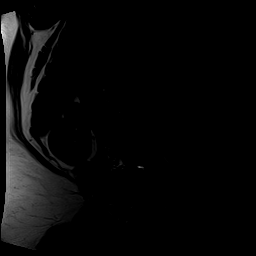
[im 19/19]
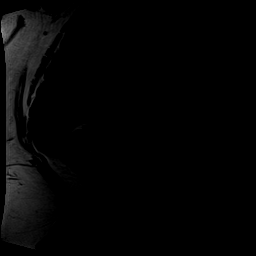

[Series 20: T1 · sagittal · 4.0mm · 0.81mm/px · 6 of 19 slices shown (2 of 3)]
[im 1/19]
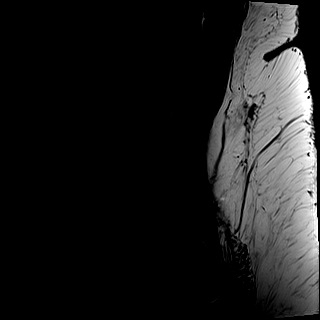
[im 4/19]
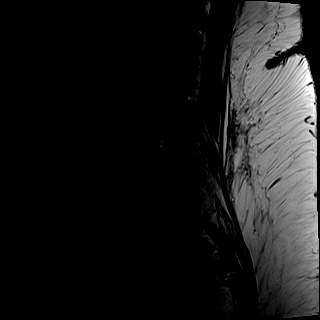
[im 8/19]
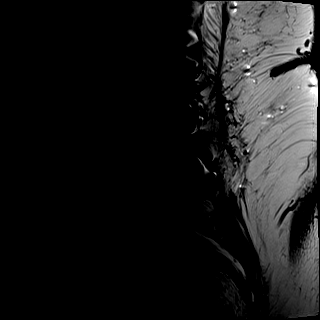
[im 11/19]
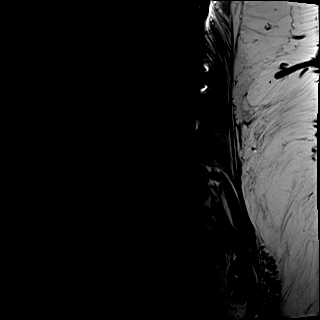
[im 15/19]
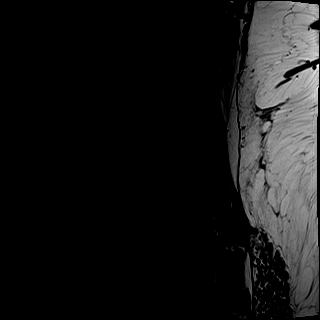
[im 19/19]
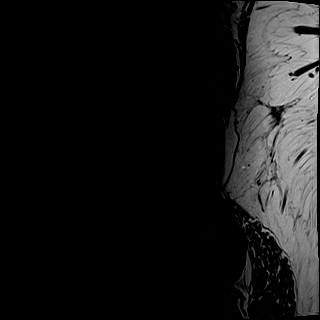

[Series 22: T1 · axial · 4.0mm · 0.39mm/px · z∈[-83,+124]mm · 8 of 36 slices shown (3 of 3)]
[im 1/36]
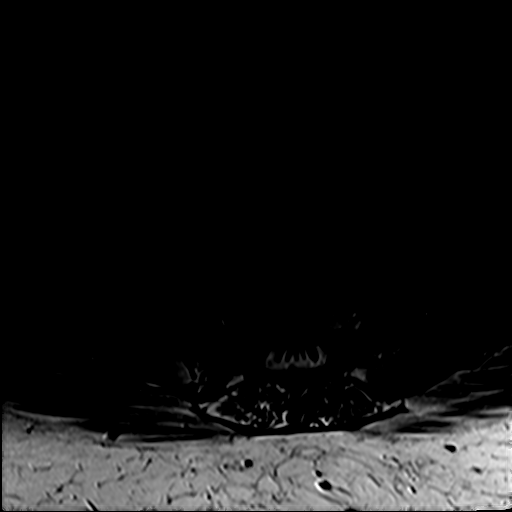
[im 7/36]
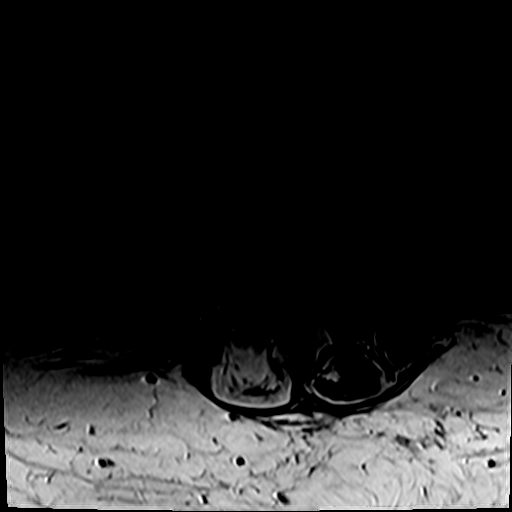
[im 10/36]
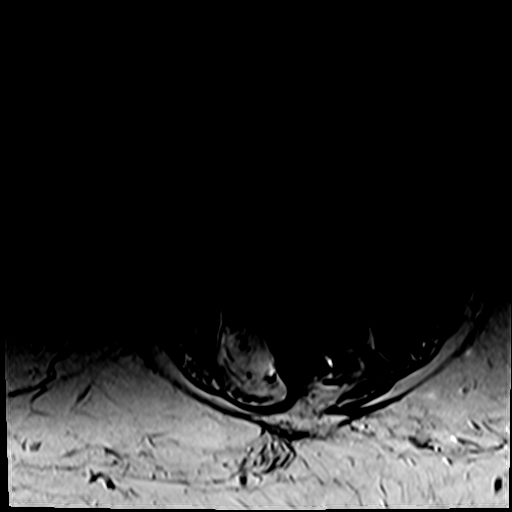
[im 16/36]
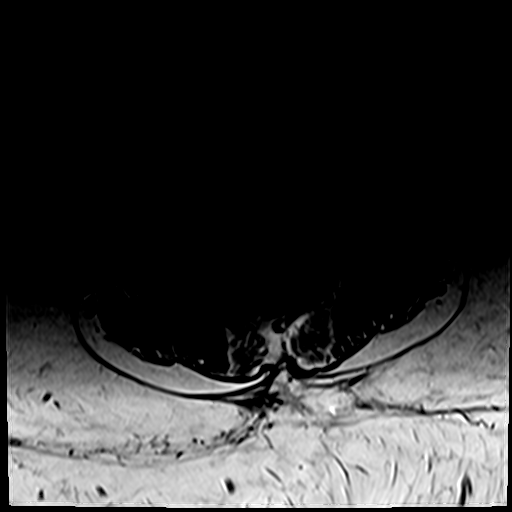
[im 20/36]
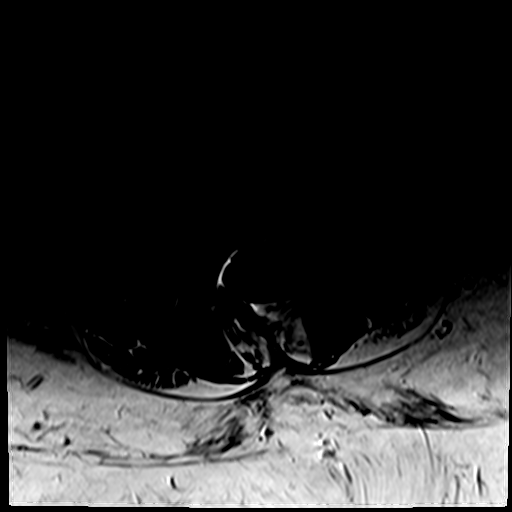
[im 26/36]
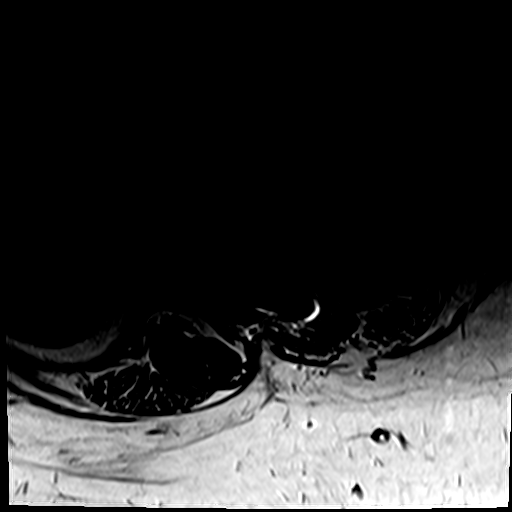
[im 29/36]
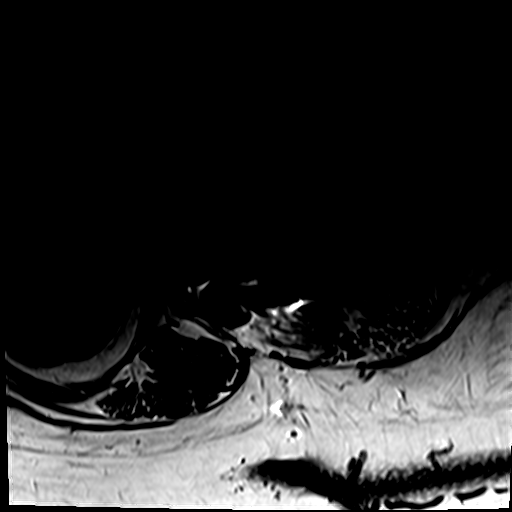
[im 36/36]
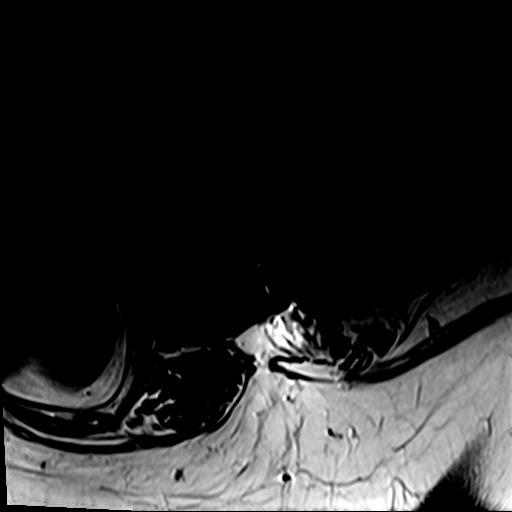

[Series 23: T2 · axial · 4.0mm · 0.78mm/px · z∈[-83,+124]mm · 9 of 36 slices shown (1 of 2)]
[im 1/36]
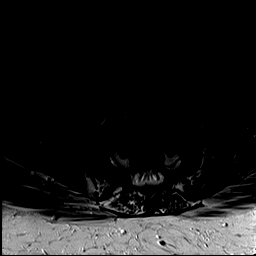
[im 7/36]
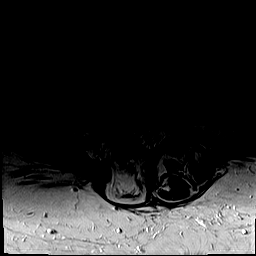
[im 10/36]
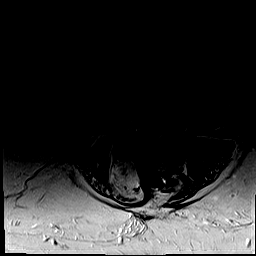
[im 16/36]
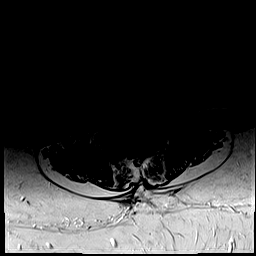
[im 20/36]
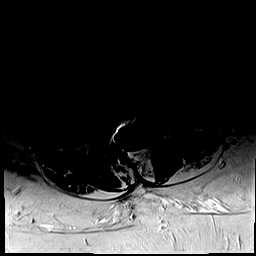
[im 26/36]
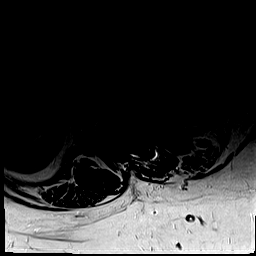
[im 29/36]
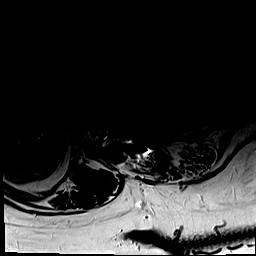
[im 32/36]
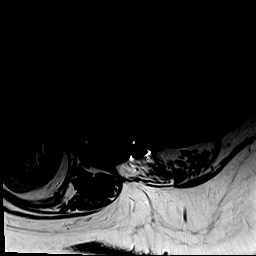
[im 36/36]
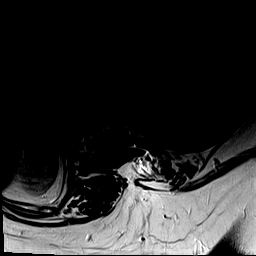

[Series 24: T2 · sagittal · 4.0mm · 0.81mm/px · 6 of 19 slices shown (2 of 2)]
[im 1/19]
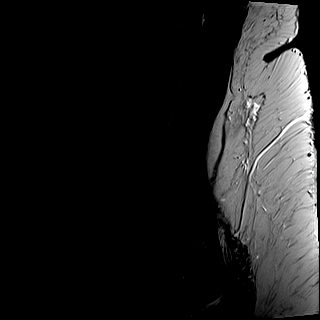
[im 4/19]
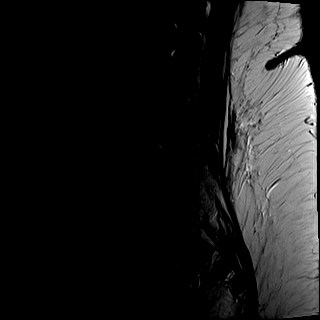
[im 8/19]
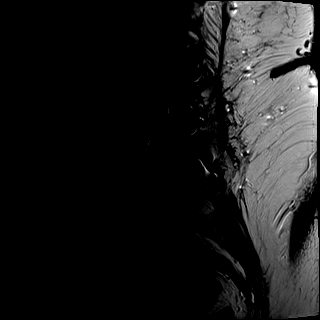
[im 11/19]
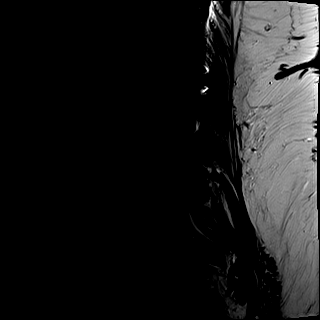
[im 15/19]
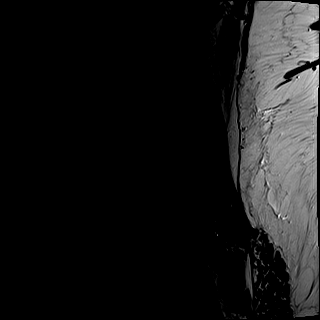
[im 19/19]
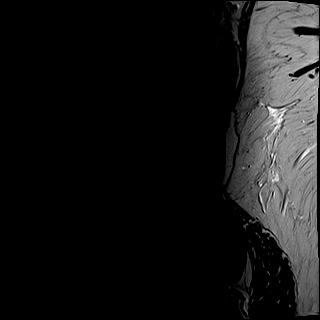

[35 of 48 positions shown; findings below may reference images not displayed]

FINDINGS: Segmentation: 5 non rib-bearing lumbar type vertebral bodies are
present. The lowest fully formed vertebral body is L5.

Alignment: No significant listhesis is present. Rightward curvature
is present thoracolumbar junction.

Vertebrae:  Marrow signal and vertebral body heights are normal.

Conus medullaris and cauda equina: Conus extends to the T12-L1
level. Conus and cauda equina appear normal.

Paraspinal and other soft tissues: Limited imaging the abdomen is
unremarkable. There is no significant adenopathy. No solid organ
lesions are present.

Disc levels:

T12-L1: Negative.

L1-2: No significant disc protrusion or stenosis.

L2-3: No significant central or foraminal stenosis.

L3-4: The level is somewhat obscured by metal artifact. No
significant central or foraminal stenosis is present. Mild disc
bulging is evident.

L4-5: Mild facet hypertrophy is present bilaterally. No focal disc
protrusion or stenosis is evident.

L5-S1: Moderate facet spurring is present bilaterally. No
significant focal disc protrusion or stenosis is evident.
IMPRESSION: 1. Moderate facet hypertrophy at L4-5 and L5-S1 without significant
focal disc protrusion or stenosis at either level.
2. Mild disc bulging at L3-4 without significant central or
foraminal stenosis.
3. Artifact from Harrington rods somewhat obscures the upper lumbar
spine.
4. Advanced scoliosis convex to the right.

## 2020-05-24 ENCOUNTER — Ambulatory Visit (INDEPENDENT_AMBULATORY_CARE_PROVIDER_SITE_OTHER): Payer: Medicare Other

## 2020-05-24 ENCOUNTER — Other Ambulatory Visit: Payer: Self-pay

## 2020-05-24 ENCOUNTER — Ambulatory Visit
Admission: EM | Admit: 2020-05-24 | Discharge: 2020-05-24 | Disposition: A | Discharge status: Home / Self Care | Payer: Medicare Other

## 2020-05-24 DIAGNOSIS — R062 Wheezing: Secondary | ICD-10-CM

## 2020-05-24 DIAGNOSIS — R0602 Shortness of breath: Secondary | ICD-10-CM

## 2020-05-24 DIAGNOSIS — J069 Acute upper respiratory infection, unspecified: Secondary | ICD-10-CM | POA: Diagnosis not present

## 2020-05-24 DIAGNOSIS — R059 Cough, unspecified: Secondary | ICD-10-CM | POA: Diagnosis not present

## 2020-05-24 IMAGING — CR DG CHEST 2V
3 series · 3 of 3 positions shown · non-contrast
Comparison: [DATE]

CLINICAL DATA: Cough shortness of breath and wheezing

EXAM:
CHEST - 2 VIEW

[chest pa]
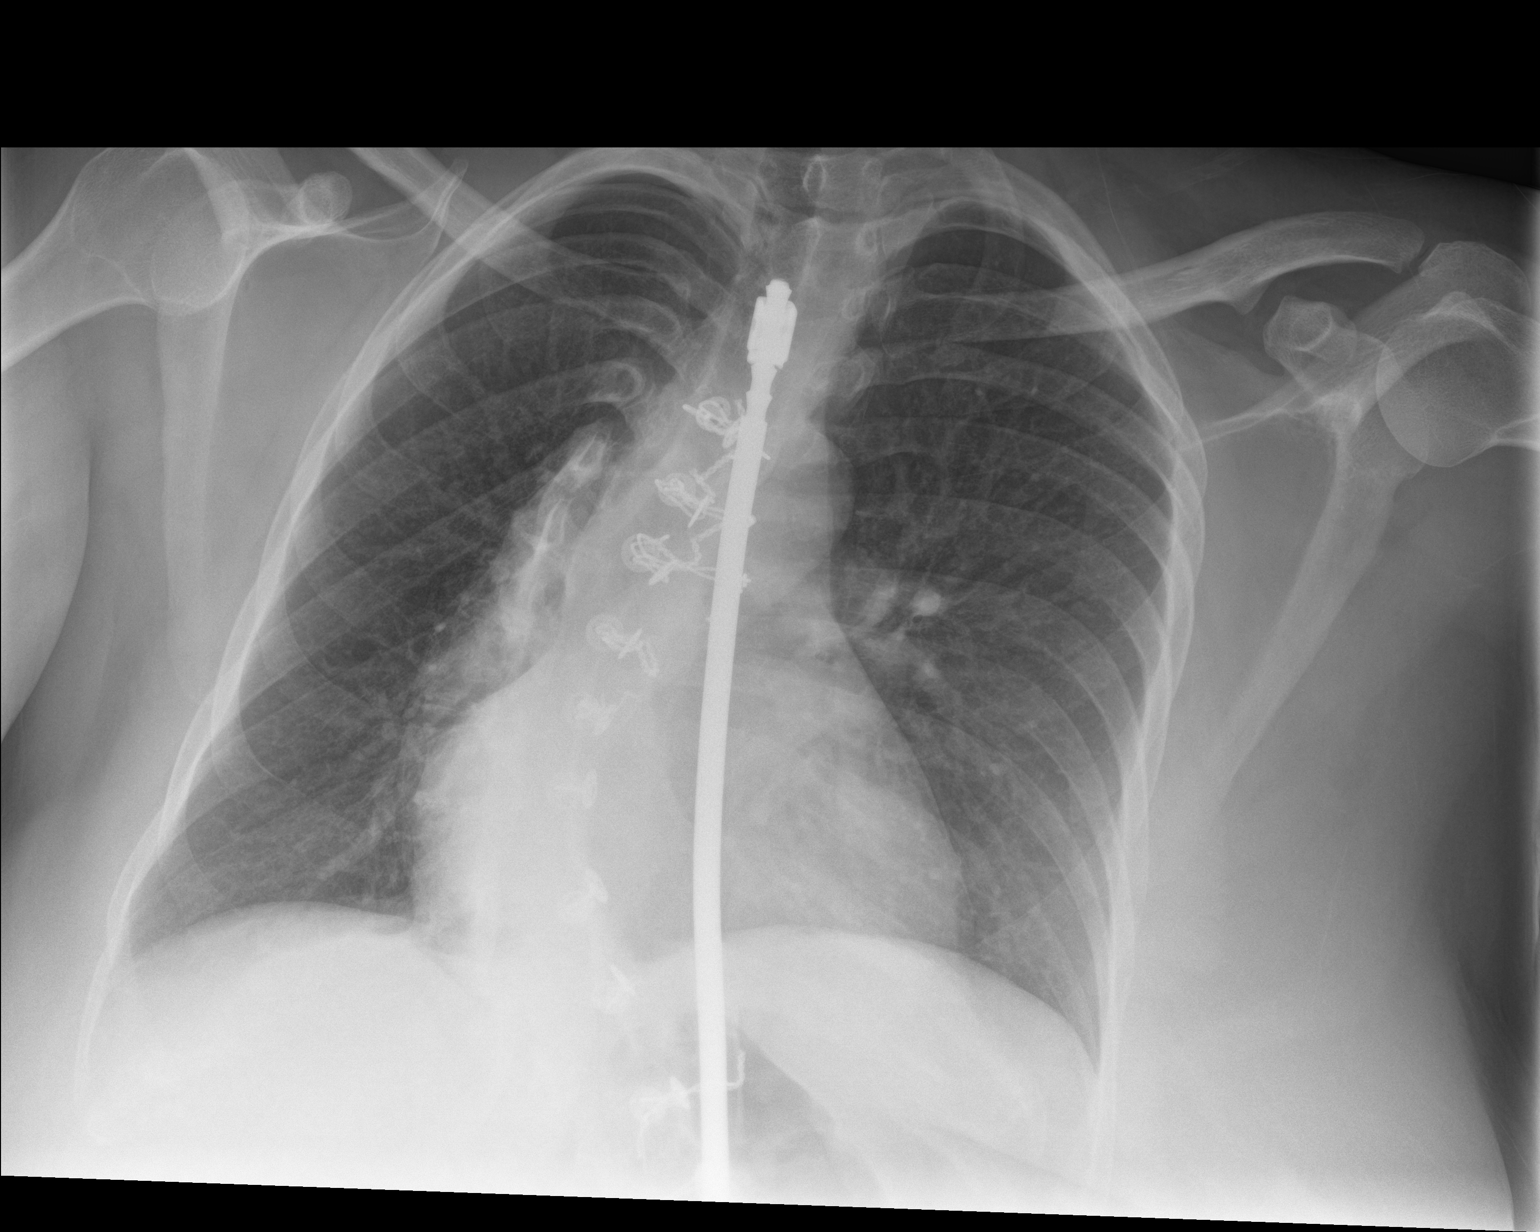

[chest lat (1 of 2)]
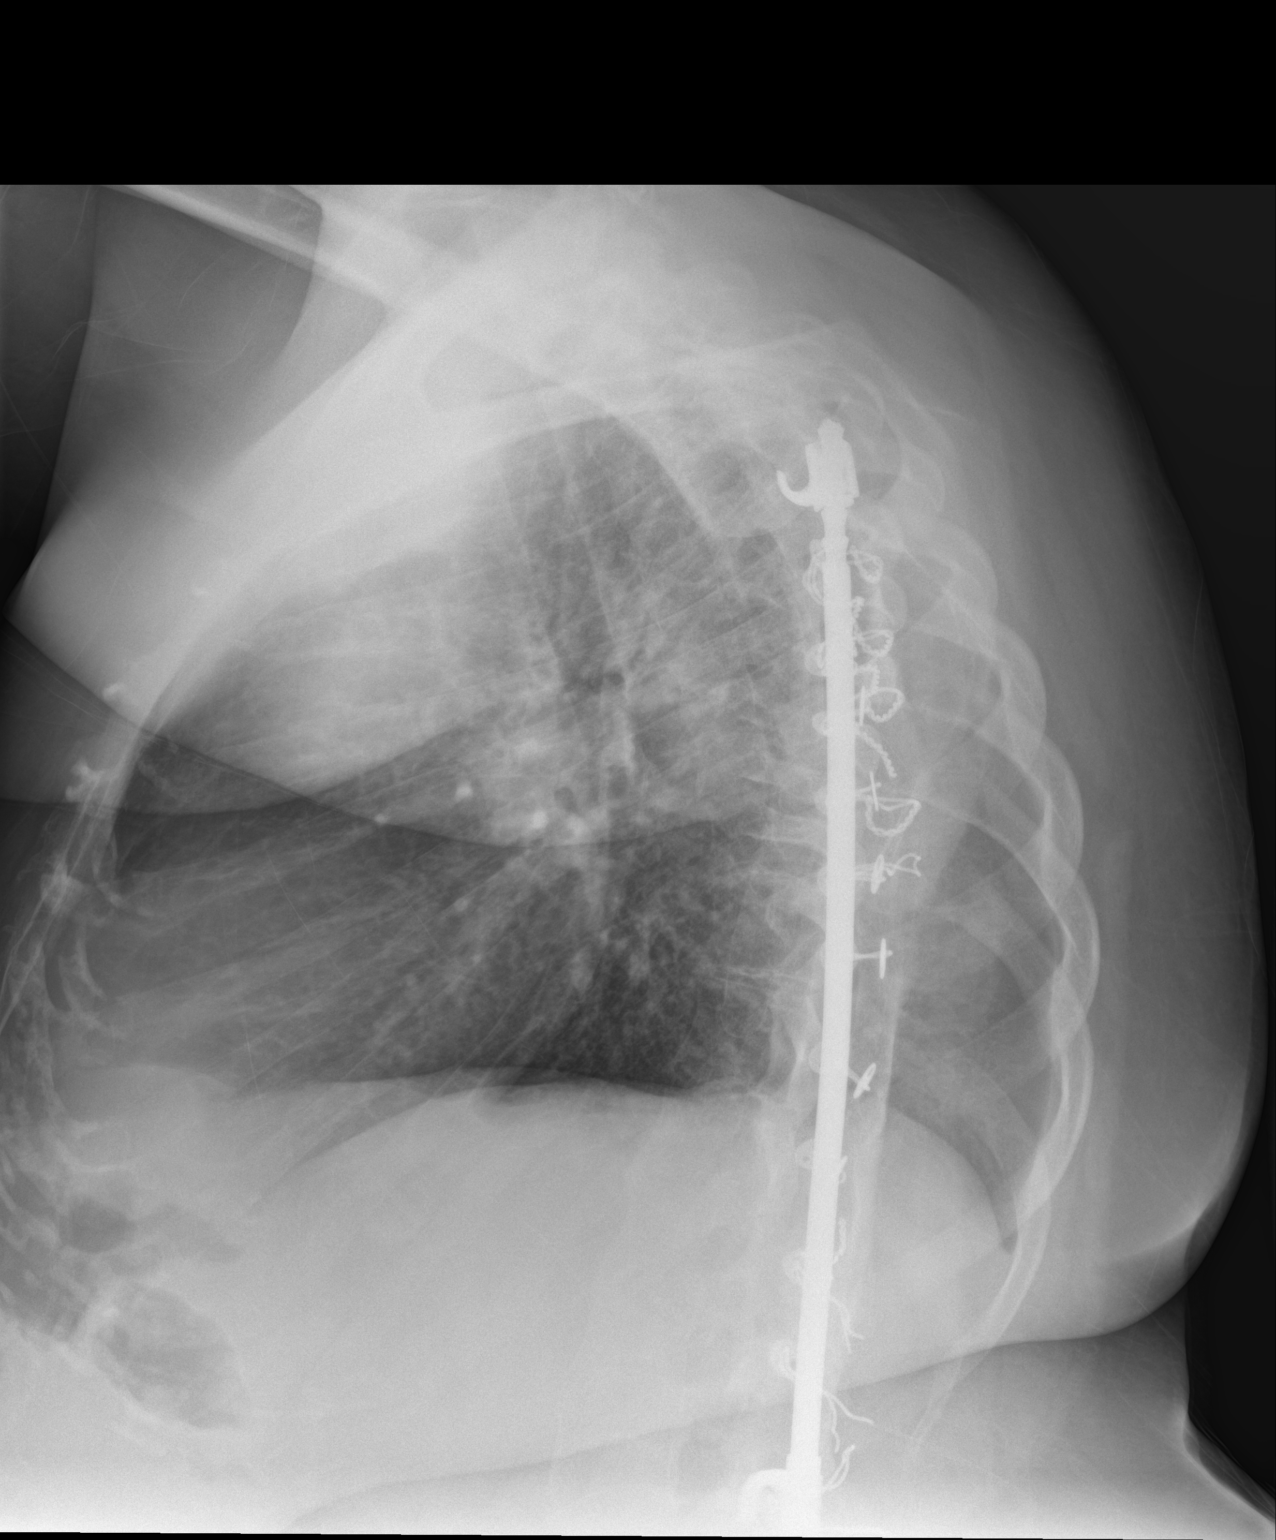

[chest lat (2 of 2)]
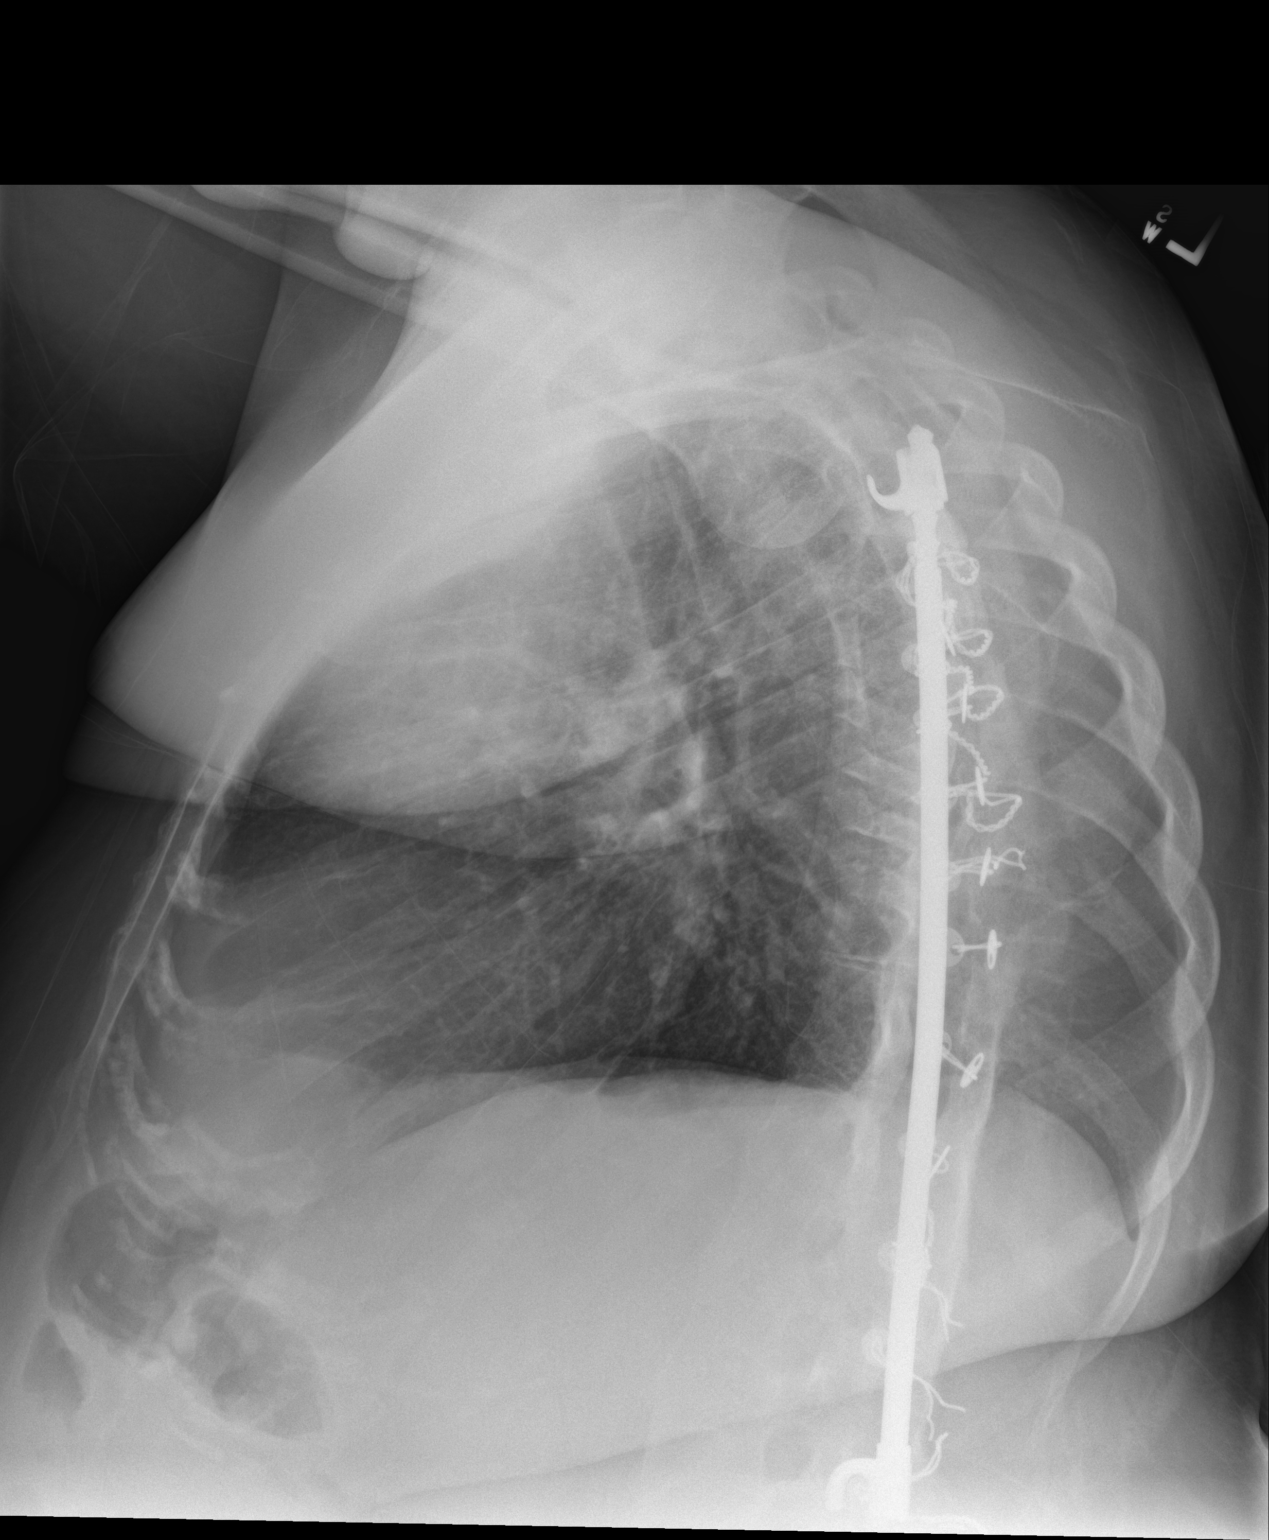

[3 of 3 positions shown; findings below may reference images not displayed]

FINDINGS: The heart size and mediastinal contours are within normal limits. No
focal consolidation. No pleural effusion. No pneumothorax. Stable
position of the Harrington rod 4 dextroscoliosis.
IMPRESSION: No active cardiopulmonary disease.

## 2020-05-24 MED ORDER — PROMETHAZINE-DM 6.25-15 MG/5ML PO SYRP: 5.0000 mL | ORAL_SOLUTION | Freq: Four times a day (QID) | ORAL | 0 refills | Status: DC | PRN

## 2020-05-24 MED ORDER — BENZONATATE 100 MG PO CAPS: 200.0000 mg | ORAL_CAPSULE | Freq: Three times a day (TID) | ORAL | 0 refills | Status: DC

## 2020-05-24 MED ORDER — ALBUTEROL SULFATE HFA 108 (90 BASE) MCG/ACT IN AERS: 1.0000 | INHALATION_SPRAY | Freq: Four times a day (QID) | RESPIRATORY_TRACT | 0 refills | Status: DC | PRN

## 2020-05-24 MED ORDER — AEROCHAMBER MV MISC: 2 refills | Status: DC

## 2020-05-24 NOTE — ED Provider Notes (Signed)
[facility]    CSN: [id number] Arrival date & time: 05/24/20  1731      History   Chief Complaint Chief Complaint  Patient presents with  . Nasal Congestion  . Cough    HPI Tracey White is a 40 y.o. female.   HPI   40 year old female here for evaluation of sinus congestion, headache, and cough.  Patient reports that she has been experiencing her symptoms for the last week and is also had associated symptoms of right ear pain, nausea, and body aches.  Patient reports that her nasal discharge is clear but her cough is productive for a green sputum and she has had some shortness of breath and wheezing.  Patient denies fever, vomiting or diarrhea, sore throat, or known COVID exposure.  Patient reports that her significant other was just treated for pneumonia.  Past Medical History:  Diagnosis Date  . Anxiety   . CHF (congestive heart failure) (HCC)   . Chronic heart failure (HCC)   . Morbid obesity (HCC)   . OSA (obstructive sleep apnea)    uses CPAP at home  . Peripheral edema   . Scoliosis     Patient Active Problem List   Diagnosis Date Noted  . DDD (degenerative disc disease), lumbosacral 11/23/2019  . Spondylosis without myelopathy or radiculopathy, lumbosacral region 11/23/2019  . 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 Bilateral sacroiliitis (HCC) 10/28/2019  . Lumbar facet syndrome (Bilateral) (R>L) 07/14/2019  . Chronic sacroiliac joint pain (Right) 07/14/2019  . Chronic lower extremity pain (2ry area of Pain) (Right) 07/14/2019  . Chronic knee pain (3ry area of Pain) (Right) 07/14/2019  . Osteoarthritis of knees (Bilateral) 07/14/2019  . Chronic hip pain (4th area of Pain) (Right) 07/14/2019  . Prediabetes 07/13/2019  . Elevated hemoglobin A1c 07/13/2019  . 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 Hypertensive heart disease 07/22/2017  . Long term current use of opiate analgesic  06/02/2017  . Goals of care, counseling/discussion 05/07/2017  . AIDS (HCC) 05/07/2017  . B12 deficiency 04/16/2017  . Chronic diastolic congestive heart failure (HCC) 04/02/2017  . Iron deficiency anemia 03/19/2017  . Positive ANA (antinuclear antibody) 02/04/2017  . Positive antinuclear antibody 02/04/2017  . 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 Pharmacologic therapy 01/08/2017  . Problems influencing health status 01/08/2017  . Chronic lower extremity pain (Bilateral) (R>L) 01/08/2017  . Elevated C-reactive protein (CRP) 01/08/2017  . Elevated sed rate 01/08/2017  . Vitamin D deficiency 01/08/2017  . Class 3 severe obesity due to excess calories with serious comorbidity and body mass index (BMI) of 50.0 to 59.9 in adult (HCC) 01/08/2017  . Acute diastolic heart failure (HCC) 01/01/2017  . Anemia 12/25/2016  . Asthma without status asthmaticus 12/24/2016  . Chronic knee pain (Bilateral) (R>L) 12/12/2016  . Chronic thoracic back pain (Fourth Area of Pain) (Bilateral) (R>L) 12/12/2016  . Chronic pain syndrome 12/12/2016  . Disorder of skeletal system 12/12/2016  . Other long term (current) drug therapy 12/12/2016  . Scoliosis 12/12/2016  . Lymphedema 09/24/2016  . Skin lesion of left leg 09/05/2016  . Depression 08/27/2016  . Chronic low back pain (1ry area of Pain) (Bilateral) (R>L) w/o sciatica 08/27/2016  . Hypokalemia 06/06/2016  . Chest pain 05/31/2016  . Chronic diastolic heart failure (HCC) 05/15/2016  . Obstructive sleep apnea 05/15/2016  . Tobacco use 05/15/2016  . Sinus tachycardia 10/31/2015  . Chest pain at rest 10/18/2015  Past Surgical History:  Procedure Laterality Date  . bariatric sleeve    . HERNIA REPAIR    . scoliosis repair    . TUBAL LIGATION      OB History   No obstetric history on file.      Home Medications    Prior to Admission medications   Medication Sig Start Date End Date Taking? Authorizing  Provider  benzonatate (TESSALON) 100 MG capsule Take 2 capsules (200 mg total) by mouth every 8 (eight) hours. 05/24/20  Yes Becky Augustayan, Casper Pagliuca, NP  bumetanide (BUMEX) 2 MG tablet Take 4 mg by mouth 2 (two) times daily.    Yes [provider]  HUMIRA PEN 40 MG/0.4ML PNKT SMARTSIG:40 Milligram(s) SUB-Q Every 2 Weeks 05/01/20  Yes [provider]  hydrOXYzine (ATARAX/VISTARIL) 50 MG tablet Take 0.5-1 tablets by mouth 2 (two) times daily as needed. 04/16/20  Yes [provider]  KLOR-CON M20 20 MEQ tablet TAKE 2 TABLETS (40 MEQ TOTAL) IN AM AND 1 TAB IN EVENING 09/02/18  Yes Hackney, Tina A, FNP  promethazine-dextromethorphan (PROMETHAZINE-DM) 6.25-15 MG/5ML syrup Take 5 mLs by mouth 4 (four) times daily as needed. 05/24/20  Yes Becky Augustayan, Lezette Kitts, NP  Spacer/Aero-Holding Chambers (AEROCHAMBER MV) inhaler Use as instructed 05/24/20  Yes Becky Augustayan, Debra Colon, NP  traZODone (DESYREL) 100 MG tablet  07/07/19  Yes [provider]  ursodiol (ACTIGALL) 250 MG tablet  04/25/19  Yes [provider]  Vitamin D, Ergocalciferol, (DRISDOL) 50000 units CAPS capsule Take 50,000 Units by mouth every 7 (seven) days.   Yes [provider]  albuterol (VENTOLIN HFA) 108 (90 Base) MCG/ACT inhaler Inhale 1-2 puffs into the lungs every 6 (six) hours as needed for wheezing or shortness of breath. 05/24/20   Becky Augustayan, Treniece Holsclaw, NP  ibuprofen (ADVIL) 800 MG tablet  06/23/19   [provider]  lamoTRIgine (LAMICTAL) 150 MG tablet Take 150 mg by mouth daily.    [provider]  omeprazole (PRILOSEC) 20 MG capsule Take 20 mg by mouth daily.    [provider]  urea (CARMOL) 40 % CREA Apply topically. 07/09/19   [provider]  ipratropium (ATROVENT) 0.06 % nasal spray Place 2 sprays into both nostrils 3 (three) times daily.   05/24/20  [provider]  spironolactone (ALDACTONE) 25 MG tablet Take 25 mg by mouth daily.  05/24/20  [provider]  traMADol (ULTRAM)  50 MG tablet Take 1 tablet (50 mg total) by mouth every 6 (six) hours as needed for severe pain. Must last 30 days 11/15/19 05/24/20  Delano MetzNaveira, Francisco, MD    Family History Family History  Problem Relation Age of Onset  . Diabetes Mother   . Hypertension Mother   . Cancer Maternal Grandmother     Social History Social History   Tobacco Use  . Smoking status: Current Every Day Smoker    Packs/day: 0.20    Types: Cigarettes    Last attempt to quit: 02/16/2017    Years since quitting: 3.2  . Smokeless tobacco: Never Used  . Tobacco comment: hasn't smoked in 3-4 months  Vaping Use  . Vaping Use: Never used  Substance Use Topics  . Alcohol use: Yes    Alcohol/week: 1.0 - 2.0 standard drink    Types: 1 - 2 Standard drinks or equivalent per week    Comment: occasionally  . Drug use: No     Allergies   Aspirin   Review of Systems Review of Systems  Constitutional: Negative for  activity change, appetite change and fever.  HENT: Positive for congestion, ear pain, rhinorrhea, sinus pressure and sinus pain. Negative for sore throat.   Respiratory: Positive for cough, shortness of breath and wheezing.   Gastrointestinal: Positive for nausea. Negative for diarrhea and vomiting.  Musculoskeletal: Positive for arthralgias and myalgias.  Skin: Negative for rash.  Neurological: Positive for headaches.  Hematological: Negative.   Psychiatric/Behavioral: Negative.      Physical Exam Triage Vital Signs ED Triage Vitals  Enc Vitals Group     BP 05/24/20 1755 (!) 157/104     Pulse Rate 05/24/20 1755 72     Resp 05/24/20 1755 18     Temp 05/24/20 1755 98.3 F (36.8 C)     Temp Source 05/24/20 1755 Oral     SpO2 05/24/20 1755 99 %     Weight 05/24/20 1750 286 lb (129.7 kg)     Height 05/24/20 1750 5\' 1"  (1.549 m)     Head Circumference --      Peak Flow --      Pain Score 05/24/20 1750 8     Pain Loc --      Pain Edu? --      Excl. in GC? --    No data found.  Updated  Vital Signs BP (!) 157/104 (BP Location: Left Arm)   Pulse 72   Temp 98.3 F (36.8 C) (Oral)   Resp 18   Ht 5\' 1"  (1.549 m)   Wt 286 lb (129.7 kg)   LMP 05/13/2020 (Approximate)   SpO2 99%   BMI 54.04 kg/m   Visual Acuity Right Eye Distance:   Left Eye Distance:   Bilateral Distance:    Right Eye Near:   Left Eye Near:    Bilateral Near:     Physical Exam Vitals and nursing note reviewed.  Constitutional:      General: She is not in acute distress.    Appearance: Normal appearance. She is obese. She is not ill-appearing.  HENT:     Head: Normocephalic and atraumatic.     Right Ear: Tympanic membrane, ear canal and external ear normal. There is no impacted cerumen.     Left Ear: Tympanic membrane, ear canal and external ear normal. There is no impacted cerumen.     Nose: Congestion and rhinorrhea present.  Cardiovascular:     Rate and Rhythm: Normal rate and regular rhythm.     Pulses: Normal pulses.     Heart sounds: Normal heart sounds. No murmur heard. No gallop.   Pulmonary:     Effort: Pulmonary effort is normal.     Breath sounds: Wheezing and rales present.  Musculoskeletal:     Cervical back: Normal range of motion and neck supple.  Lymphadenopathy:     Cervical: No cervical adenopathy.  Skin:    General: Skin is warm and dry.     Capillary Refill: Capillary refill takes less than 2 seconds.     Findings: No erythema or rash.  Neurological:     General: No focal deficit present.     Mental Status: She is alert and oriented to person, place, and time.  Psychiatric:        Mood and Affect: Mood normal.        Behavior: Behavior normal.        Thought Content: Thought content normal.        Judgment: Judgment normal.      UC Treatments / Results  Labs (  all labs ordered are listed, but only abnormal results are displayed) Labs Reviewed - No data to display  EKG   Radiology DG Chest 2 View  Result Date: 05/24/2020 CLINICAL DATA:  Cough  shortness of breath and wheezing EXAM: CHEST - 2 VIEW COMPARISON:  September 25, 2017 FINDINGS: The heart size and mediastinal contours are within normal limits. No focal consolidation. No pleural effusion. No pneumothorax. Stable position of the Harrington rod 4 dextroscoliosis. IMPRESSION: No active cardiopulmonary disease. Electronically Signed   By: Maudry Mayhew MD   On: 05/24/2020 18:54    Procedures Procedures (including critical care time)  Medications Ordered in UC Medications - No data to display  Initial Impression / Assessment and Plan / UC Course  I have reviewed the triage vital signs and the nursing notes.  Pertinent labs & imaging results that were available during my care of the patient were reviewed by me and considered in my medical decision making (see chart for details).   Patient is a very pleasant 40 year old female here for evaluation of cold symptoms that been going on for the past week and have consisted of headache, sinus congestion, clear nasal discharge, right ear pain, shortness of breath and wheezing with the productive cough for green sputum, nausea, and body aches.  Patient is unaware of any known COVID exposure but does report that her boyfriend was just treated for pneumonia.  Physical exam reveals pearly gray tympanic membranes bilaterally with a normal light reflex and clear external auditory canals.  Nasal mucosa is pale and edematous with clear nasal discharge.  No cervical lymphadenopathy appreciated on exam.  Lungs have wheezes and rales in all fields and patient has a harsh cough.  Will obtain chest x-ray.  Chest x-ray is negative for acute cardio thoracic process per radiology report.  We will discharge patient home with a diagnosis of viral URI with cough.  Will give patient Tessalon Perles and Promethazine DM to help with cough as well as an albuterol inhaler and a spacer to help with wheezing.  Patient will be encouraged to use sinus irrigation to help  with nasal congestion.   Final Clinical Impressions(s) / UC Diagnoses   Final diagnoses:  Viral URI with cough     Discharge Instructions     Use your albuterol inhaler with the spacer, 2 puffs every 4-6 hours, as needed for shortness of breath and wheezing.  Use the Tessalon Perles every 8 hours during the day.  Take them with a small sip of water.  They may give you some numbness to the base of your tongue or a metallic taste in your mouth, this is normal.  Use the Promethazine DM cough syrup at bedtime for cough and congestion.  It will make you drowsy so do not take it during the day.  Return for reevaluation or see your primary care provider for any new or worsening symptoms.     ED Prescriptions    Medication Sig Dispense Auth. Provider   benzonatate (TESSALON) 100 MG capsule Take 2 capsules (200 mg total) by mouth every 8 (eight) hours. 21 capsule Becky Augusta, NP   promethazine-dextromethorphan (PROMETHAZINE-DM) 6.25-15 MG/5ML syrup Take 5 mLs by mouth 4 (four) times daily as needed. 118 mL Becky Augusta, NP   albuterol (VENTOLIN HFA) 108 (90 Base) MCG/ACT inhaler Inhale 1-2 puffs into the lungs every 6 (six) hours as needed for wheezing or shortness of breath. 18 g Becky Augusta, NP   Spacer/Aero-Holding Chambers (AEROCHAMBER MV) inhaler  Use as instructed 1 each Becky Augusta, NP     PDMP not reviewed this encounter.   Becky Augusta, NP 05/24/20 1907

## 2020-05-24 NOTE — ED Triage Notes (Signed)
Pt c/o sinus congestion, headache and cough since Wednesday and increasing over the past few days. Pt states her chest mucus is now green. Pt denies f/n/d, does report some nausea.

## 2020-05-24 NOTE — Discharge Instructions (Addendum)
Use your albuterol inhaler with the spacer, 2 puffs every 4-6 hours, as needed for shortness of breath and wheezing.  Use the Tessalon Perles every 8 hours during the day.  Take them with a small sip of water.  They may give you some numbness to the base of your tongue or a metallic taste in your mouth, this is normal.  Use the Promethazine DM cough syrup at bedtime for cough and congestion.  It will make you drowsy so do not take it during the day.  Return for reevaluation or see your primary care provider for any new or worsening symptoms.

## 2020-08-30 ENCOUNTER — Ambulatory Visit
Admission: EM | Admit: 2020-08-30 | Discharge: 2020-08-30 | Disposition: A | Discharge status: Home / Self Care | Payer: Medicare Other | Attending: Sports Medicine | Admitting: Sports Medicine

## 2020-08-30 ENCOUNTER — Other Ambulatory Visit: Payer: Self-pay

## 2020-08-30 ENCOUNTER — Encounter: Payer: Self-pay | Admitting: Emergency Medicine

## 2020-08-30 DIAGNOSIS — M5441 Lumbago with sciatica, right side: Secondary | ICD-10-CM | POA: Diagnosis not present

## 2020-08-30 DIAGNOSIS — G8929 Other chronic pain: Secondary | ICD-10-CM

## 2020-08-30 DIAGNOSIS — M79604 Pain in right leg: Secondary | ICD-10-CM | POA: Diagnosis not present

## 2020-08-30 DIAGNOSIS — M5410 Radiculopathy, site unspecified: Secondary | ICD-10-CM

## 2020-08-30 DIAGNOSIS — R29898 Other symptoms and signs involving the musculoskeletal system: Secondary | ICD-10-CM

## 2020-08-30 MED ORDER — PREDNISONE 10 MG (21) PO TBPK: ORAL_TABLET | Freq: Every day | ORAL | 0 refills | Status: DC

## 2020-08-30 NOTE — ED Triage Notes (Addendum)
Pt presents today with c/o pain/numbness to RLE x 5 day. She reports that numbness began on lower right leg and radiates upwards toward knee. Described as "burning". She does report a fall that occurred one week ago. She reports seeing PCP yesterday for same. She would like second opinion.

## 2020-08-30 NOTE — Discharge Instructions (Addendum)
As we discussed, your right leg pain with numbness and tingling appear to be coming from your back.  He has some subjective weakness in your right lower extremity. I prescribed you a 12-day prednisone taper.  Please do not take any Motrin, Advil, ibuprofen, Naprosyn, or Aleve.  You can take Tylenol only if he needs something extra.  You can continue with the muscle relaxer given to you by your primary care provider yesterday. Please contact your physical medicine and rehabilitation physician Dr. Mariah Milling to be seen as soon as possible.  I believe you will need to get an MRI to further understand what is going on in your low back. I also gave you the name of orthopedics in case you cannot get into see Dr. Mariah Milling. I offered you a work note but you deferred on that. Please see educational handouts. If your symptoms worsen or you develop any issues with incontinence of bowel or bladder, please call 911 and go to the emergency room.

## 2020-08-30 NOTE — ED Provider Notes (Signed)
[facility]    CSN: [id number] Arrival date & time: 08/30/20  1448      History   Chief Complaint Chief Complaint  Patient presents with   Leg Pain    RLE    HPI Tracey White is a 40 y.o. female.   40 year old female who presents for evaluation of numbness and tingling and right leg pain that has been present for about 5 days.  Complicating her situation as she has chronic back issues.  Has been seen with [facility] physical medicine [facility].  She has received multiple epidural steroid injections.  She says the last 1 in March really did not help her.  She has not been back to see them yet.  She tried to call them this morning but was able to get an appointment.  She was seen by her primary care provider yesterday and given a muscle relaxer.  She said initially the numbness and tingling was in the anterior aspect of her shin.  It has progressed into her foot and ankle and is now up above her knee.  She denies any incontinence of bowel or bladder.  No saddle anesthesia.  She has also had surgery for scoliosis and has some rods in her spine.  She has some weakness in her right leg that is perceived by her.  She works as a Conservation officer, naturepersonal care attendant.  Her primary care provider is [facility] at [facility] in [place].  She reports that she wanted to come into the urgent care today for second opinion but also that her symptoms have actually worsened since yesterday and she is fairly concerned.     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 11/11/2019   Bilateral sacroiliitis (HCC) 10/28/2019   Lumbar facet syndrome (Bilateral) (R>L) 07/14/2019   Chronic sacroiliac joint pain (Right) 07/14/2019   Chronic lower extremity pain (2ry area of Pain) (Right) 07/14/2019   Chronic knee pain (3ry area of Pain) (Right) 07/14/2019   Osteoarthritis of knees (Bilateral) 07/14/2019   Chronic hip pain (4th area of Pain) (Right) 07/14/2019   Prediabetes 07/13/2019   Elevated hemoglobin A1c 07/13/2019   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   Hypertensive heart disease 07/22/2017   Long term current use of opiate analgesic 06/02/2017   Goals of care, counseling/discussion 05/07/2017   AIDS (HCC) 05/07/2017   B12 deficiency 04/16/2017   Chronic diastolic congestive heart failure (HCC) 04/02/2017   Iron deficiency anemia 03/19/2017   Positive ANA (antinuclear antibody) 02/04/2017   Positive antinuclear antibody 02/04/2017   Neurogenic pain 01/08/2017   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   Pharmacologic therapy 01/08/2017   Problems influencing health status 01/08/2017   Chronic lower extremity pain (Bilateral) (R>L) 01/08/2017   Elevated C-reactive protein (CRP) 01/08/2017   Elevated sed rate 01/08/2017   Vitamin D deficiency 01/08/2017   Class 3 severe obesity due to excess calories with serious comorbidity and body mass index (BMI) of 50.0 to 59.9 in adult [facility](HCC)  01/08/2017   Acute diastolic heart failure (HCC) 01/01/2017   Anemia 12/25/2016   Asthma without status asthmaticus 12/24/2016   Chronic knee pain (Bilateral) (R>L) 12/12/2016   Chronic thoracic back pain (Fourth Area of Pain) (Bilateral) (R>L) 12/12/2016   Chronic pain syndrome 12/12/2016   Disorder of skeletal system 12/12/2016   Other long term (current) drug therapy 12/12/2016   Scoliosis 12/12/2016   Lymphedema 09/24/2016   Skin lesion of left leg 09/05/2016   Depression 08/27/2016   Chronic low back pain (1ry area of  Pain) (Bilateral) (R>L) w/o sciatica 08/27/2016   Hypokalemia 06/06/2016   Chest pain 05/31/2016   Chronic diastolic heart failure (HCC) 05/15/2016   Obstructive sleep apnea 05/15/2016   Tobacco use 05/15/2016   Sinus tachycardia 10/31/2015   Chest pain at rest 10/18/2015    Past Surgical History:  Procedure Laterality Date   bariatric sleeve     HERNIA REPAIR     scoliosis repair     TUBAL LIGATION      OB History   No obstetric history on file.      Home Medications    Prior to Admission medications   Medication Sig Start Date End Date Taking? Authorizing Provider  predniSONE (STERAPRED UNI-PAK 21 TAB) 10 MG (21) TBPK tablet Take by mouth daily. Take 6 tabs by mouth daily  for 2 days, then 5 tabs for 2 days, then 4 tabs for 2 days, then 3 tabs for 2 days, 2 tabs for 2 days, then 1 tab by mouth daily for 2 days 08/30/20  Yes Delton See, MD  albuterol (VENTOLIN HFA) 108 (90 Base) MCG/ACT inhaler Inhale 1-2 puffs into the lungs every 6 (six) hours as needed for wheezing or shortness of breath. 05/24/20   Becky Augusta, NP  benzonatate (TESSALON) 100 MG capsule Take 2 capsules (200 mg total) by mouth every 8 (eight) hours. 05/24/20   Becky Augusta, NP  bumetanide (BUMEX) 2 MG tablet Take 4 mg by mouth 2 (two) times daily.     [provider]  HUMIRA PEN 40 MG/0.4ML PNKT SMARTSIG:40 Milligram(s) SUB-Q Every 2 Weeks 05/01/20   [provider]  hydrOXYzine (ATARAX/VISTARIL) 50 MG tablet Take 0.5-1 tablets by mouth 2 (two) times daily as needed. 04/16/20   [provider]  ibuprofen (ADVIL) 800 MG tablet  06/23/19   [provider]  KLOR-CON M20 20 MEQ tablet TAKE 2 TABLETS (40 MEQ TOTAL) IN AM AND 1 TAB IN EVENING 09/02/18   Clarisa Kindred A, FNP  lamoTRIgine (LAMICTAL) 150 MG tablet Take 150 mg by mouth daily.    [provider]  omeprazole (PRILOSEC) 20 MG capsule Take 20 mg by mouth daily.    [provider]   promethazine-dextromethorphan (PROMETHAZINE-DM) 6.25-15 MG/5ML syrup Take 5 mLs by mouth 4 (four) times daily as needed. 05/24/20   Becky Augusta, NP  Spacer/Aero-Holding Deretha Emory (AEROCHAMBER MV) inhaler Use as instructed 05/24/20   Becky Augusta, NP  traZODone (DESYREL) 100 MG tablet  07/07/19   [provider]  urea (CARMOL) 40 % CREA Apply topically. 07/09/19   [provider]  ursodiol (ACTIGALL) 250 MG tablet  04/25/19   [provider]  Vitamin D, Ergocalciferol, (DRISDOL) 50000 units CAPS capsule Take 50,000 Units by mouth every 7 (seven) days.    [provider]  ipratropium (ATROVENT) 0.06 % nasal spray Place 2 sprays into both nostrils 3 (three) times daily.   05/24/20  [provider]  spironolactone (ALDACTONE) 25 MG  tablet Take 25 mg by mouth daily.  05/24/20  [provider]  traMADol (ULTRAM) 50 MG tablet Take 1 tablet (50 mg total) by mouth every 6 (six) hours as needed for severe pain. Must last 30 days 11/15/19 05/24/20  Delano Metz, MD    Family History Family History  Problem Relation Age of Onset   Diabetes Mother    Hypertension Mother    Cancer Maternal Grandmother     Social History Social History   Tobacco Use   Smoking status: Every Day    Packs/day: 0.20    Types: Cigarettes    Last attempt to quit: 02/16/2017    Years since quitting: 3.5   Smokeless tobacco: Never   Tobacco comments:    hasn't smoked in 3-4 months  Vaping Use   Vaping Use: Never used  Substance Use Topics   Alcohol use: Yes    Alcohol/week: 1.0 - 2.0 standard drink    Types: 1 - 2 Standard drinks or equivalent per week    Comment: occasionally   Drug use: No     Allergies   Aspirin   Review of Systems Review of Systems  Constitutional:  Negative for activity change, appetite change, chills, diaphoresis, fatigue and fever.  HENT:  Negative for congestion, ear pain, postnasal drip, rhinorrhea, sinus pressure, sinus pain,  sneezing and sore throat.   Eyes:  Negative for pain.  Respiratory:  Negative for cough, chest tightness and shortness of breath.   Cardiovascular:  Negative for chest pain and palpitations.  Gastrointestinal:  Negative for abdominal pain, diarrhea, nausea and vomiting.  Genitourinary:  Negative for dysuria.  Musculoskeletal:  Positive for arthralgias, back pain and gait problem. Negative for joint swelling, myalgias, neck pain and neck stiffness.  Skin:  Negative for color change, pallor, rash and wound.  Neurological:  Positive for weakness and numbness. Negative for dizziness, light-headedness and headaches.  All other systems reviewed and are negative.   Physical Exam Triage Vital Signs ED Triage Vitals  Enc Vitals Group     BP 08/30/20 1512 108/69     Pulse Rate 08/30/20 1512 99     Resp 08/30/20 1512 20     Temp 08/30/20 1512 98.6 F (37 C)     Temp Source 08/30/20 1512 Oral     SpO2 08/30/20 1512 96 %     Weight --      Height --      Head Circumference --      Peak Flow --      Pain Score 08/30/20 1510 10     Pain Loc --      Pain Edu? --      Excl. in GC? --    No data found.  Updated Vital Signs BP 108/69 (BP Location: Right Arm)   Pulse 99   Temp 98.6 F (37 C) (Oral)   Resp 20   LMP 08/10/2020 (Exact Date)   SpO2 96%   Visual Acuity Right Eye Distance:   Left Eye Distance:   Bilateral Distance:    Right Eye Near:   Left Eye Near:    Bilateral Near:     Physical Exam Vitals and nursing note reviewed.  Constitutional:      General: She is not in acute distress.    Appearance: Normal appearance. She is obese. She is not ill-appearing, toxic-appearing or diaphoretic.  HENT:     Head: Normocephalic and atraumatic.     Nose: Nose normal.  Mouth/Throat:     Mouth: Mucous membranes are moist.  Eyes:     Conjunctiva/sclera: Conjunctivae normal.     Pupils: Pupils are equal, round, and reactive to light.  Cardiovascular:     Rate and Rhythm:  Normal rate and regular rhythm.     Pulses: Normal pulses.     Heart sounds: Normal heart sounds. No murmur heard.   No friction rub. No gallop.  Pulmonary:     Effort: Pulmonary effort is normal.     Breath sounds: Normal breath sounds. No stridor. No wheezing, rhonchi or rales.  Musculoskeletal:     Cervical back: Normal range of motion and neck supple.     Comments: Examination of the lower extremities reveals some perceived weakness to the right lower extremity with knee extension, knee flexion, hip extension.  Also some weakness with dorsi and plantar flexion.  Some of this is effort dependent.  She has 1-2+ deep tendon reflexes at the Achilles and patellar tendon bilaterally.  Skin:    General: Skin is warm and dry.     Capillary Refill: Capillary refill takes less than 2 seconds.     Coloration: Skin is not jaundiced.     Findings: No erythema or rash.  Neurological:     General: No focal deficit present.     Mental Status: She is alert and oriented to person, place, and time.     UC Treatments / Results  Labs (all labs ordered are listed, but only abnormal results are displayed) Labs Reviewed - No data to display  EKG   Radiology No results found.  Procedures Procedures (including critical care time)  Medications Ordered in UC Medications - No data to display  Initial Impression / Assessment and Plan / UC Course  I have reviewed the triage vital signs and the nursing notes.  Pertinent labs & imaging results that were available during my care of the patient were reviewed by me and considered in my medical decision making (see chart for details).  Clinical impression: 1.  Right leg pain from knee down into the foot and ankle 2.  Radiculopathy of the right leg with radiculitis 3.  Chronic midline low back pain with right-sided sciatica 4.  Right leg weakness without red flag signs or symptoms  Treatment plan: 1.  The findings and treatment plan were discussed in  detail with the patient.  Patient was in agreement. 2.  Given the acuteness of her new finding I will go ahead and prescribe a 12-day prednisone taper.  I asked her not to take any NSAIDs.  She can take Tylenol if she needs any something extra.  She can take the muscle relaxer although I am not appreciating much spasm on examination. 3.  Given her subjective weakness and her radiculopathy I believe she is going to need an MRI.  I can order that out of the acute care.  I have asked her to follow-up with her physical medicine rehabilitation doctor.  If she is unable to get in there I did give her the name and number of a local orthopedist. 4.  Depending on the MRI results she may need some physical therapy or continued injections. 5.  Educational handouts provided. 6.  I offered a work note but she deferred. 7.  I went over the red flag signs and symptoms and when to call 911 and go to the ER.  She voiced verbal understanding. 8.  She was stable upon discharge and will follow-up here  as needed.    Final Clinical Impressions(s) / UC Diagnoses   Final diagnoses:  Right leg pain  Radiculopathy of leg  Chronic midline low back pain with right-sided sciatica  Right leg weakness     Discharge Instructions      As we discussed, your right leg pain with numbness and tingling appear to be coming from your back.  He has some subjective weakness in your right lower extremity. I prescribed you a 12-day prednisone taper.  Please do not take any Motrin, Advil, ibuprofen, Naprosyn, or Aleve.  You can take Tylenol only if he needs something extra.  You can continue with the muscle relaxer given to you by your primary care provider yesterday. Please contact your physical medicine and rehabilitation physician Dr. Mariah Milling to be seen as soon as possible.  I believe you will need to get an MRI to further understand what is going on in your low back. I also gave you the name of orthopedics in case you cannot get  into see Dr. Mariah Milling. I offered you a work note but you deferred on that. Please see educational handouts. If your symptoms worsen or you develop any issues with incontinence of bowel or bladder, please call 911 and go to the emergency room.     ED Prescriptions     Medication Sig Dispense Auth. Provider   predniSONE (STERAPRED UNI-PAK 21 TAB) 10 MG (21) TBPK tablet Take by mouth daily. Take 6 tabs by mouth daily  for 2 days, then 5 tabs for 2 days, then 4 tabs for 2 days, then 3 tabs for 2 days, 2 tabs for 2 days, then 1 tab by mouth daily for 2 days 42 tablet Delton See, MD      PDMP not reviewed this encounter.   Delton See, MD 08/30/20 [phone]

## 2020-09-01 ENCOUNTER — Other Ambulatory Visit: Payer: Self-pay | Admitting: Family Medicine

## 2020-09-01 ENCOUNTER — Ambulatory Visit: Payer: Medicare Other

## 2020-09-01 DIAGNOSIS — M5416 Radiculopathy, lumbar region: Secondary | ICD-10-CM

## 2020-09-02 ENCOUNTER — Ambulatory Visit: Payer: Medicare Other

## 2020-09-10 ENCOUNTER — Ambulatory Visit
Admission: RE | Admit: 2020-09-10 | Discharge: 2020-09-10 | Disposition: A | Discharge status: Home / Self Care | Payer: Medicare Other | Source: Ambulatory Visit | Attending: Family Medicine | Admitting: Family Medicine

## 2020-09-10 DIAGNOSIS — M5416 Radiculopathy, lumbar region: Secondary | ICD-10-CM | POA: Diagnosis not present

## 2020-09-10 IMAGING — MR MR LUMBAR SPINE W/O CM
4 of 5 series · 25 of 48 positions shown · non-contrast
Comparison: [DATE]

CLINICAL DATA: Central and right low back pain. Right hip, buttock,
leg and foot pain. Lumbar radiculitis.

EXAM:
MRI LUMBAR SPINE WITHOUT CONTRAST
TECHNIQUE: Multiplanar, multisequence MR imaging of the lumbar spine was
performed. No intravenous contrast was administered.

[Series 10: [id number]_[id number] · sagittal · 4.0mm · 0.86mm/px · 3 of 21 slices shown]
[im 3/21]
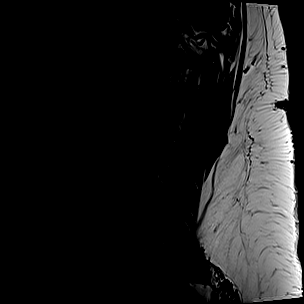
[im 12/21]
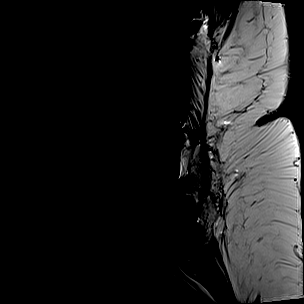
[im 18/21]
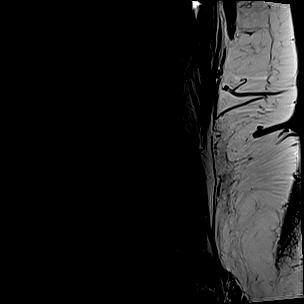

[Series 12: STIR · sagittal · 4.0mm · 0.43mm/px · 4 of 21 slices shown]
[im 1/21]
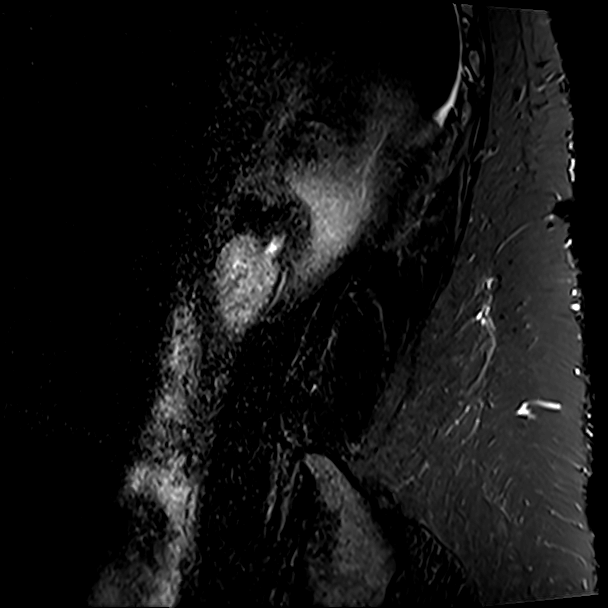
[im 3/21]
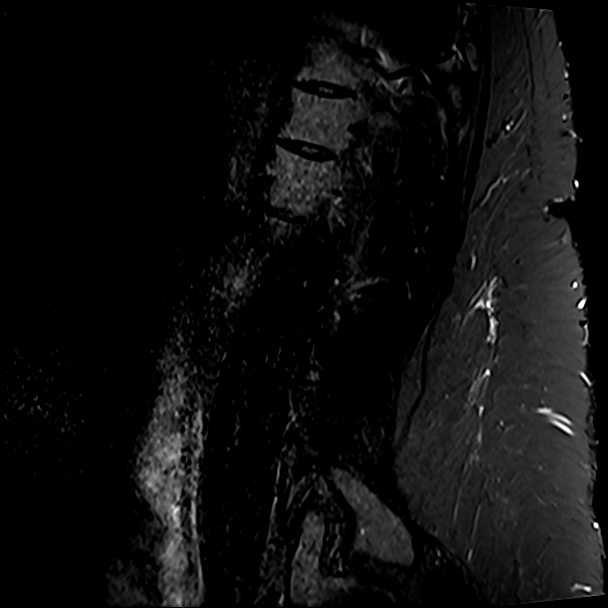
[im 12/21]
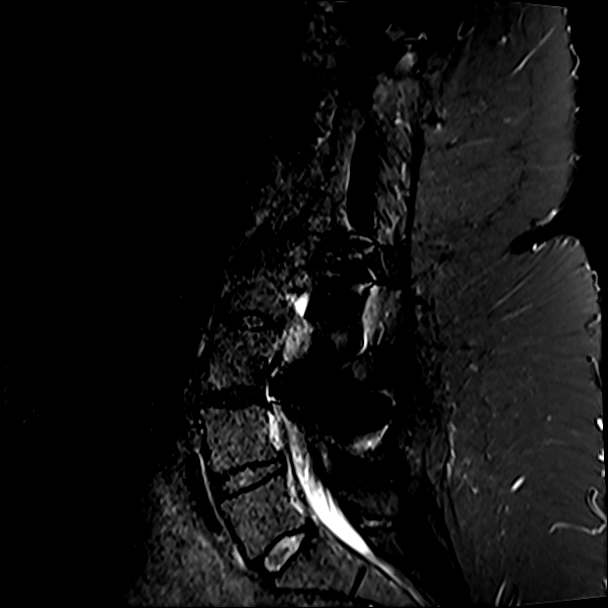
[im 18/21]
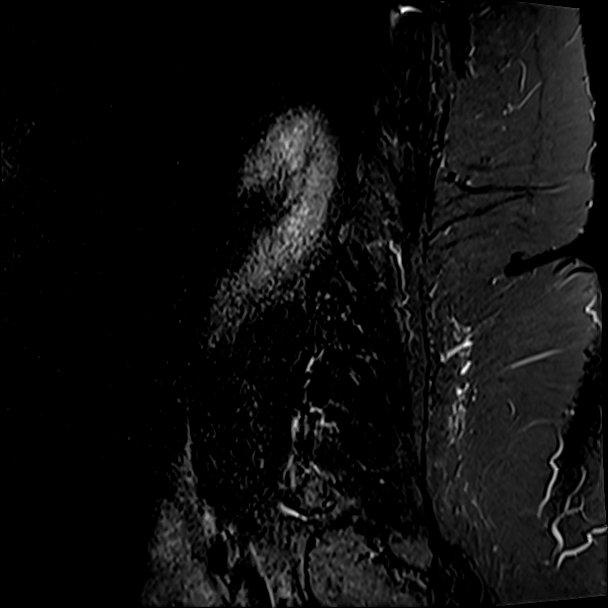

[Series 15: T2 · axial · 4.0mm · 0.78mm/px · z∈[-80,+111]mm · 9 of 32 slices shown]
[im 1/32]
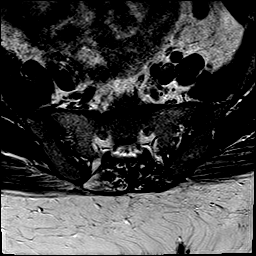
[im 6/32]
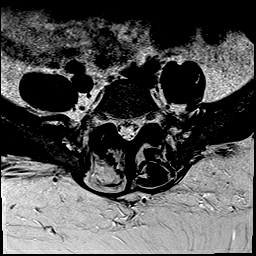
[im 9/32]
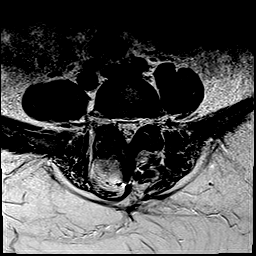
[im 15/32]
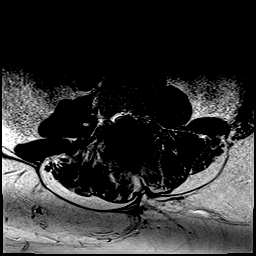
[im 17/32]
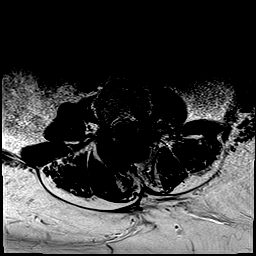
[im 23/32]
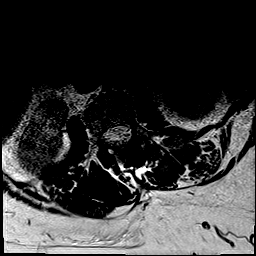
[im 26/32]
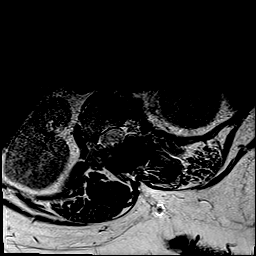
[im 29/32]
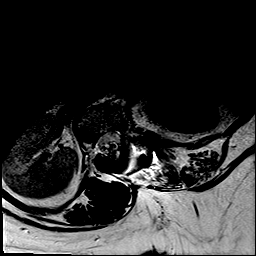
[im 32/32]
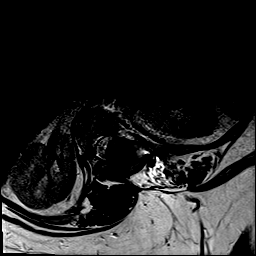

[Series 16: T1 · axial · 4.0mm · 0.39mm/px · z∈[-80,+111]mm · 9 of 32 slices shown]
[im 1/32]
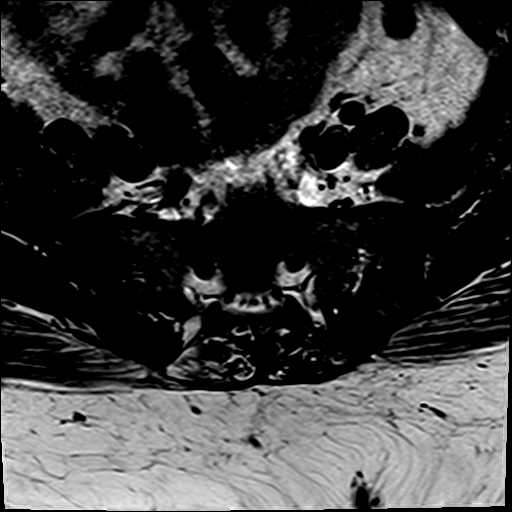
[im 6/32]
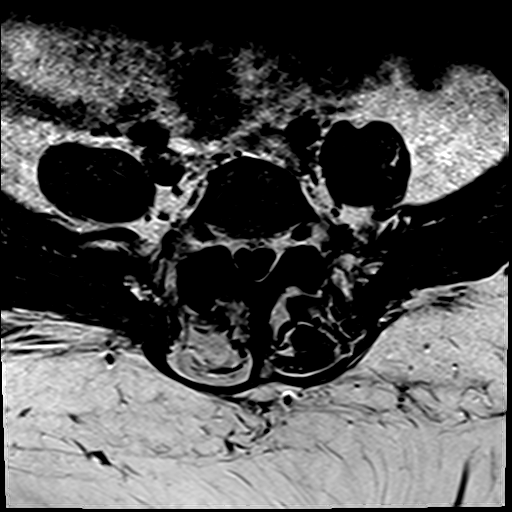
[im 9/32]
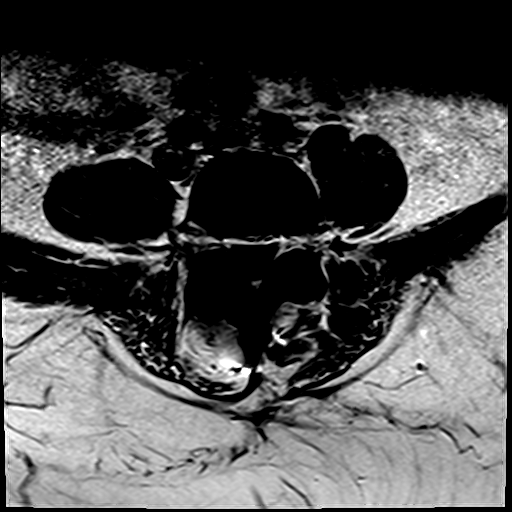
[im 15/32]
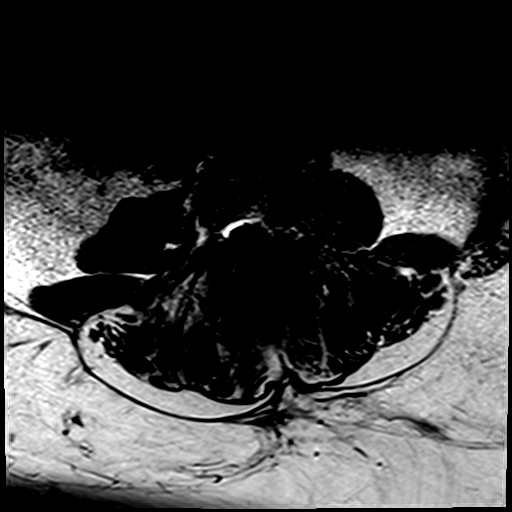
[im 17/32]
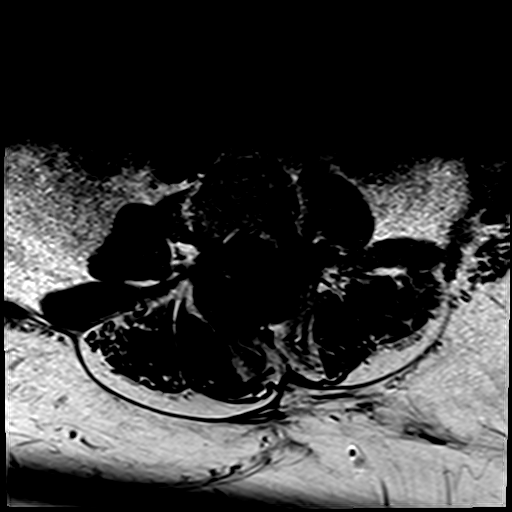
[im 23/32]
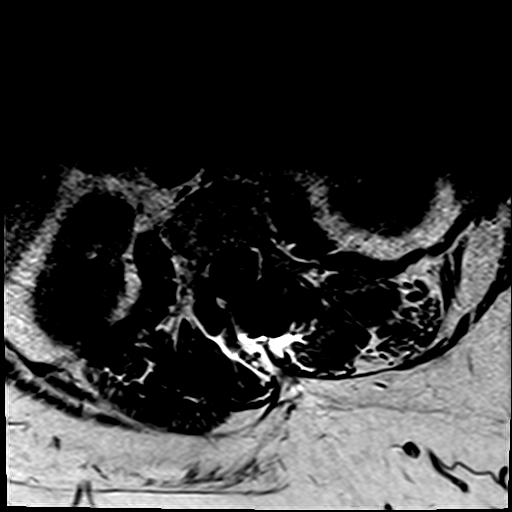
[im 26/32]
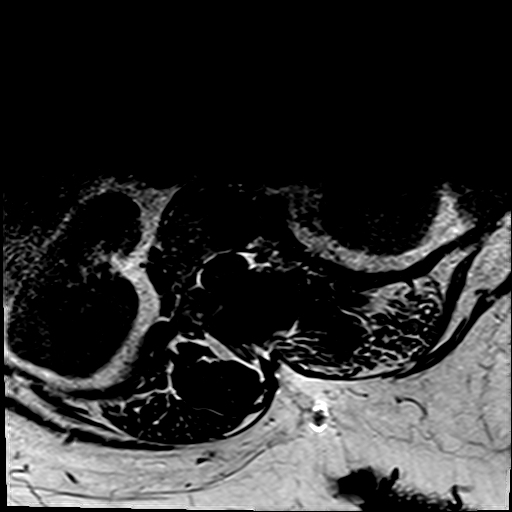
[im 29/32]
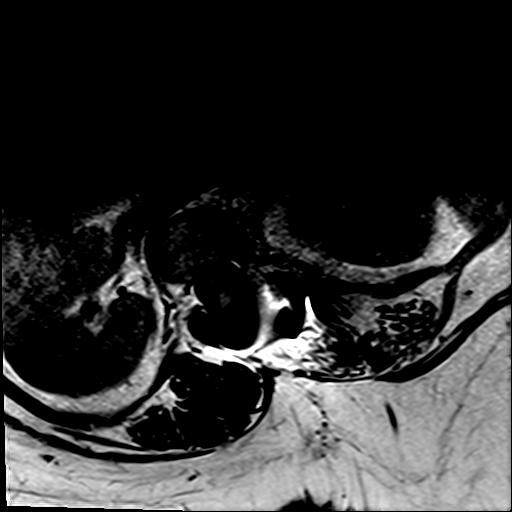
[im 32/32]
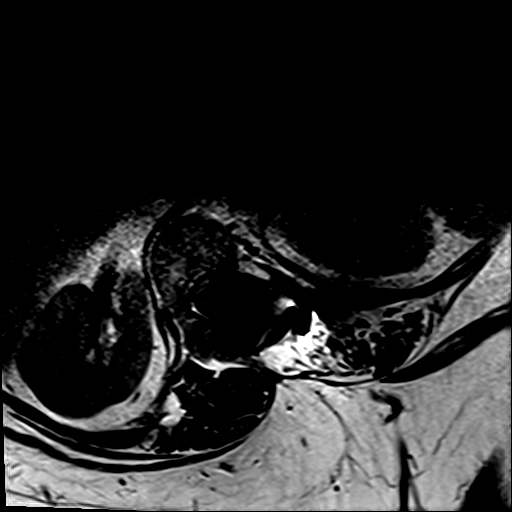

[25 of 48 positions shown; findings below may reference images not displayed]

FINDINGS: Segmentation:  The 5 lumbar type vertebral bodies assumed.

Alignment: Chronic scoliotic curvature of the spine status post
fusion with of the thoracic region to the posterior elements of L3
and L4.

Vertebrae: No acute bone finding of significance. Detail is limited
by artifact from fusion hardware.

Conus medullaris and cauda equina: Conus extends to the lower L1
level. Conus and cauda equina appear normal.

Paraspinal and other soft tissues: Negative

Disc levels:

L3-4: Question shallow left posterolateral to foraminal disc
herniation that could possibly affect the left L3 nerve. Limited
detail because of artifact.

L4-5: Mild disc bulge. Mild facet osteoarthritis. No compressive
stenosis.

L5-S1: No disc abnormality.  Mild facet osteoarthritis.
IMPRESSION: Fusion from the thoracic region to the posterior elements of L3 or
L4. Limited detail because of extensive artifact from fusion
hardware.

Question shallow left posterolateral to foraminal disc herniation at
L3-4 that could possibly affect the left L3 nerve.

Facet osteoarthritis at L4-5 and L5-S1 that could contribute to low
back pain.

## 2020-10-23 ENCOUNTER — Other Ambulatory Visit: Payer: Self-pay

## 2020-10-23 ENCOUNTER — Encounter: Payer: Self-pay | Admitting: Emergency Medicine

## 2020-10-23 ENCOUNTER — Ambulatory Visit
Admission: EM | Admit: 2020-10-23 | Discharge: 2020-10-23 | Disposition: A | Discharge status: Home / Self Care | Payer: Medicare Other | Attending: Internal Medicine | Admitting: Internal Medicine

## 2020-10-23 DIAGNOSIS — M1000 Idiopathic gout, unspecified site: Secondary | ICD-10-CM | POA: Insufficient documentation

## 2020-10-23 LAB — CBC WITH DIFFERENTIAL/PLATELET
Abs Immature Granulocytes: 0.04 10*3/uL (ref 0.00–0.07)
Basophils Absolute: 0 10*3/uL (ref 0.0–0.1)
Basophils Relative: 1 %
Eosinophils Absolute: 0.4 10*3/uL (ref 0.0–0.5)
Eosinophils Relative: 6 %
HCT: 38.3 % (ref 36.0–46.0)
Hemoglobin: 12.2 g/dL (ref 12.0–15.0)
Immature Granulocytes: 1 %
Lymphocytes Relative: 30 %
Lymphs Abs: 2.3 10*3/uL (ref 0.7–4.0)
MCH: 26 pg (ref 26.0–34.0)
MCHC: 31.9 g/dL (ref 30.0–36.0)
MCV: 81.5 fL (ref 80.0–100.0)
Monocytes Absolute: 0.4 10*3/uL (ref 0.1–1.0)
Monocytes Relative: 6 %
Neutro Abs: 4.4 10*3/uL (ref 1.7–7.7)
Neutrophils Relative %: 56 %
Platelets: 287 10*3/uL (ref 150–400)
RBC: 4.7 MIL/uL (ref 3.87–5.11)
RDW: 15.5 % (ref 11.5–15.5)
WBC: 7.6 10*3/uL (ref 4.0–10.5)
nRBC: 0 % (ref 0.0–0.2)

## 2020-10-23 LAB — URIC ACID: Uric Acid, Serum: 7.2 mg/dL — ABNORMAL HIGH (ref 2.5–7.1)

## 2020-10-23 MED ORDER — KETOROLAC TROMETHAMINE 60 MG/2ML IM SOLN
60.0000 mg | Freq: Once | INTRAMUSCULAR | Status: AC
  Administered 2020-10-23: 60 mg via INTRAMUSCULAR

## 2020-10-23 MED ORDER — METHYLPREDNISOLONE 4 MG PO TBPK: ORAL_TABLET | ORAL | 0 refills | Status: DC

## 2020-10-23 NOTE — ED Triage Notes (Signed)
Pt c/o right great toe pain, swelling and redness. Started about 2 days ago. She has tried warm and cold compresses and otc NSAIDs without relief. Denies h/o gout.

## 2020-10-23 NOTE — ED Provider Notes (Signed)
[facility]    CSN: [id number] Arrival date & time: 10/23/20  1035      History   Chief Complaint Chief Complaint  Patient presents with   Toe Pain    Right great toe    HPI Tracey White is a 40 y.o. female who presents with R great toe pain x 2 days. Has been swollen and hot. Denies injuring it. Has been taking BC powder and using ice and has not helped. Has never had gout and does not eat cold cuts or drink alcohol. Has + family hx of gout.     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 11/11/2019   Bilateral sacroiliitis (HCC) 10/28/2019   Lumbar facet syndrome (Bilateral) (R>L) 07/14/2019   Chronic sacroiliac joint pain (Right) 07/14/2019   Chronic lower extremity pain (2ry area of Pain) (Right) 07/14/2019   Chronic knee pain (3ry area of Pain) (Right) 07/14/2019   Osteoarthritis of knees (Bilateral) 07/14/2019   Chronic hip pain (4th area of Pain) (Right) 07/14/2019   Prediabetes 07/13/2019   Elevated hemoglobin A1c 07/13/2019   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   Hypertensive heart disease 07/22/2017   Long term current use of opiate analgesic 06/02/2017   Goals of care, counseling/discussion 05/07/2017   AIDS (HCC) 05/07/2017   B12 deficiency 04/16/2017   Chronic diastolic congestive heart failure (HCC) 04/02/2017   Iron deficiency anemia 03/19/2017   Positive ANA (antinuclear antibody) 02/04/2017   Positive antinuclear antibody 02/04/2017   Neurogenic pain 01/08/2017   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   Pharmacologic therapy 01/08/2017   Problems influencing health status 01/08/2017   Chronic lower extremity pain (Bilateral) (R>L) 01/08/2017   Elevated C-reactive protein (CRP) 01/08/2017   Elevated sed rate 01/08/2017   Vitamin D deficiency 01/08/2017   Class 3 severe obesity due to excess calories with serious comorbidity and body mass index (BMI) of 50.0 to 59.9 in adult (HCC) 01/08/2017   Acute diastolic heart failure (HCC) 01/01/2017   Anemia 12/25/2016   Asthma without status asthmaticus 12/24/2016   Chronic knee pain (Bilateral) (R>L) 12/12/2016   Chronic thoracic back pain (Fourth Area of Pain) (Bilateral) (R>L) 12/12/2016   Chronic pain syndrome 12/12/2016   Disorder of skeletal system 12/12/2016   Other long term (current) drug therapy 12/12/2016   Scoliosis 12/12/2016   Lymphedema 09/24/2016   Skin lesion of left leg 09/05/2016   Depression 08/27/2016   Chronic low back pain (1ry area of Pain) (Bilateral) (R>L) w/o sciatica 08/27/2016   Hypokalemia 06/06/2016   Chest pain 05/31/2016   Chronic diastolic heart failure (HCC) 05/15/2016   Obstructive sleep apnea 05/15/2016   Tobacco use 05/15/2016   Sinus tachycardia 10/31/2015   Chest pain at rest 10/18/2015    Past Surgical History:  Procedure Laterality Date   bariatric sleeve     HERNIA REPAIR     scoliosis repair     TUBAL LIGATION      OB History   No obstetric  history on file.      Home Medications    Prior to Admission medications   Medication Sig Start Date End Date Taking? Authorizing Provider  bumetanide (BUMEX) 2 MG tablet Take 4 mg by mouth 2 (two) times daily.    Yes [provider]  hydrOXYzine (ATARAX/VISTARIL) 50 MG tablet Take 0.5-1 tablets by mouth 2 (two) times daily as needed. 04/16/20  Yes [provider]  ibuprofen (ADVIL) 800 MG tablet  06/23/19  Yes [provider]  KLOR-CON M20 20 MEQ tablet TAKE 2 TABLETS (40 MEQ TOTAL) IN  AM AND 1 TAB IN EVENING 09/02/18  Yes Hackney, Tina A, FNP  lamoTRIgine (LAMICTAL) 150 MG tablet Take 150 mg by mouth daily.   Yes [provider]  methylPREDNISolone (MEDROL DOSEPAK) 4 MG TBPK tablet Take as directed 10/23/20  Yes Rodriguez-Southworth, Nettie Elm, PA-C  traZODone (DESYREL) 100 MG tablet  07/07/19  Yes [provider]  urea (CARMOL) 40 % CREA Apply topically. 07/09/19  Yes [provider]  ursodiol (ACTIGALL) 250 MG tablet  04/25/19  Yes [provider]  Vitamin D, Ergocalciferol, (DRISDOL) 50000 units CAPS capsule Take 50,000 Units by mouth every 7 (seven) days.   Yes [provider]  Spacer/Aero-Holding Chambers (AEROCHAMBER MV) inhaler Use as instructed 05/24/20   Becky Augusta, NP  ipratropium (ATROVENT) 0.06 % nasal spray Place 2 sprays into both nostrils 3 (three) times daily.   05/24/20  [provider]  spironolactone (ALDACTONE) 25 MG tablet Take 25 mg by mouth daily.  05/24/20  [provider]  traMADol (ULTRAM) 50 MG tablet Take 1 tablet (50 mg total) by mouth every 6 (six) hours as needed for severe pain. Must last 30 days 11/15/19 05/24/20  Delano Metz, MD    Family History Family History  Problem Relation Age of Onset   Diabetes Mother    Hypertension Mother    Cancer Maternal Grandmother     Social History Social History   Tobacco Use   Smoking status: Every Day    Packs/day: 0.20    Types: Cigarettes    Last attempt to quit: 02/16/2017    Years since quitting: 3.6   Smokeless tobacco: Never   Tobacco comments:    hasn't smoked in 3-4 months  Vaping Use   Vaping Use: Never used  Substance Use Topics   Alcohol use: Yes    Alcohol/week: 1.0 - 2.0 standard drink    Types: 1 - 2 Standard drinks or equivalent per week    Comment: occasionally   Drug use: No     Allergies   Aspirin   Review of Systems Review of Systems  Constitutional:  Negative for fever.  Musculoskeletal:  Positive for  arthralgias, gait problem and joint swelling.  Skin:  Negative for color change, rash and wound.    Physical Exam Triage Vital Signs ED Triage Vitals  Enc Vitals Group     BP 10/23/20 1139 (!) 153/92     Pulse Rate 10/23/20 1139 77     Resp 10/23/20 1139 18     Temp 10/23/20 1139 98.1 F (36.7 C)     Temp Source 10/23/20 1139 Oral     SpO2 10/23/20 1139 100 %     Weight 10/23/20 1140 285 lb 15 oz (129.7 kg)     Height 10/23/20 1140 5\' 1"  (1.549 m)     Head Circumference --      Peak Flow --      Pain Score  10/23/20 1140 10     Pain Loc --      Pain Edu? --      Excl. in [place]? --    No data found.  Updated Vital Signs BP (!) 153/92 (BP Location: Left Arm)   Pulse 77   Temp 98.1 F (36.7 C) (Oral)   Resp 18   Ht 5\' 1"  (1.549 m)   Wt 285 lb 15 oz (129.7 kg)   LMP 10/20/2020 (Approximate)   SpO2 100%   BMI 54.03 kg/m   Visual Acuity Right Eye Distance:   Left Eye Distance:   Bilateral Distance:    Right Eye Near:   Left Eye Near:    Bilateral Near:     Physical Exam Constitutional:      General: She is in acute distress.     Appearance: She is obese. She is not toxic-appearing.     Comments: In moderate pain  HENT:     Head: Normocephalic.     Right Ear: External ear normal.     Left Ear: External ear normal.  Eyes:     General: No scleral icterus.    Conjunctiva/sclera: Conjunctivae normal.  Pulmonary:     Effort: Pulmonary effort is normal.  Musculoskeletal:     Cervical back: Neck supple.     Comments: R FOOT- with moderate swelling and severe pain on distal 1st metatarsal region. Is hot and slightly red on this area. Is able to flex great toe, but raising it up causes pani. No wounds or opens skin noted in this foot, no streaking noted.   Skin:    General: Skin is warm and dry.     Findings: Erythema present. No bruising or rash.  Neurological:     Mental Status: She is alert and oriented to person, place, and time.     Gait: Gait abnormal.   Psychiatric:        Mood and Affect: Mood normal.        Behavior: Behavior normal.        Thought Content: Thought content normal.        Judgment: Judgment normal.     UC Treatments / Results  Labs (all labs ordered are listed, but only abnormal results are displayed) Labs Reviewed  URIC ACID - Abnormal; Notable for the following components:      Result Value   Uric Acid, Serum 7.2 (*)    All other components within normal limits  CBC WITH DIFFERENTIAL/PLATELET    EKG   Radiology No results found.  Procedures Procedures (including critical care time)  Medications Ordered in UC Medications  ketorolac (TORADOL) injection 60 mg (60 mg Intramuscular Given 10/23/20 1206)    Initial Impression / Assessment and Plan / UC Course  I have reviewed the triage vital signs and the nursing notes. Pertinent labs  results that were available during my care of the patient were reviewed by me and considered in my medical decision making (see chart for details). She was given Toradol 60 mg IM for pain which helped some.  Has acute gout I placed her on Medrol dose pack. See instructions.     Final Clinical Impressions(s) / UC Diagnoses   Final diagnoses:  Acute idiopathic gout, unspecified site     Discharge Instructions      Dont take BC powder or Ibuprofen while on the prednisone, but you may take Tylenol up to 1000 mg every 6 hours.      ED Prescriptions  Medication Sig Dispense Auth. Provider   methylPREDNISolone (MEDROL DOSEPAK) 4 MG TBPK tablet Take as directed 21 tablet Rodriguez-Southworth, Nettie Elm, PA-C      I have reviewed the PDMP during this encounter.   Garey Ham, PA-C 10/23/20 1402

## 2020-10-23 NOTE — Discharge Instructions (Addendum)
Dont take BC powder or Ibuprofen while on the prednisone, but you may take Tylenol up to 1000 mg every 6 hours.

## 2021-03-14 ENCOUNTER — Emergency Department
Admission: EM | Admit: 2021-03-14 | Discharge: 2021-03-14 | Disposition: A | Discharge status: Home / Self Care | Payer: Medicare Other | Attending: Student in an Organized Health Care Education/Training Program | Admitting: Student in an Organized Health Care Education/Training Program

## 2021-03-14 ENCOUNTER — Other Ambulatory Visit: Payer: Self-pay

## 2021-03-14 ENCOUNTER — Encounter: Payer: Self-pay | Admitting: Emergency Medicine

## 2021-03-14 DIAGNOSIS — M791 Myalgia, unspecified site: Secondary | ICD-10-CM | POA: Insufficient documentation

## 2021-03-14 DIAGNOSIS — Z20822 Contact with and (suspected) exposure to covid-19: Secondary | ICD-10-CM | POA: Diagnosis not present

## 2021-03-14 DIAGNOSIS — R5381 Other malaise: Secondary | ICD-10-CM | POA: Insufficient documentation

## 2021-03-14 DIAGNOSIS — J029 Acute pharyngitis, unspecified: Secondary | ICD-10-CM | POA: Insufficient documentation

## 2021-03-14 DIAGNOSIS — R519 Headache, unspecified: Secondary | ICD-10-CM | POA: Insufficient documentation

## 2021-03-14 LAB — RESP PANEL BY RT-PCR (FLU A&B, COVID) ARPGX2
Influenza A by PCR: NEGATIVE
Influenza B by PCR: NEGATIVE
SARS Coronavirus 2 by RT PCR: NEGATIVE

## 2021-03-14 MED ORDER — DEXAMETHASONE 10 MG/ML FOR PEDIATRIC ORAL USE
10.0000 mg | Freq: Once | INTRAMUSCULAR | Status: AC
  Administered 2021-03-14: 10 mg via ORAL
  Filled 2021-03-14: qty 1

## 2021-03-14 MED ORDER — AMOXICILLIN-POT CLAVULANATE 875-125 MG PO TABS
1.0000 | ORAL_TABLET | Freq: Once | ORAL | Status: AC
  Administered 2021-03-14: 1 via ORAL
  Filled 2021-03-14: qty 1

## 2021-03-14 MED ORDER — IBUPROFEN 600 MG PO TABS
600.0000 mg | ORAL_TABLET | Freq: Once | ORAL | Status: AC
  Administered 2021-03-14: 600 mg via ORAL
  Filled 2021-03-14: qty 1

## 2021-03-14 MED ORDER — AMOXICILLIN-POT CLAVULANATE 875-125 MG PO TABS: 1.0000 | ORAL_TABLET | Freq: Two times a day (BID) | ORAL | 0 refills | Status: AC

## 2021-03-14 NOTE — ED Notes (Signed)
[employer] not printing. IT ticket entered and swab sent with chart label.

## 2021-03-14 NOTE — ED Notes (Signed)
Pt c/o stuffy nose, sore throat, cough, congetsion in chest since Saturday. Was seen at PCP and tested negative for strep yesterday.

## 2021-03-14 NOTE — ED Triage Notes (Signed)
Pt c/o sore throat , HA, since Monday, states she was seen by her PCP yesterday and tested for covid, flu and strep, only has the result for the strep that is negative. States she is not feeling any better today

## 2021-03-14 NOTE — ED Provider Notes (Signed)
[facility] Provider Note    Event Date/Time   First MD Initiated Contact with Patient 03/14/21 [phone]     (approximate)   History   URI   HPI  Tracey White is a 41 y.o. female presents the ER for evaluation of sore throat myalgias generalized malaise headache for few days.  Was seen at PCPs office yesterday had a strep test which was negative also did fluid-containing which have not resulted.  Came back for worsening sore throat.  Primarily left side.  She did take some NyQuil this morning.  No SOB.  No wheezing.     Physical Exam   Triage Vital Signs: ED Triage Vitals  Enc Vitals Group     BP 03/14/21 0721 132/87     Pulse Rate 03/14/21 0721 79     Resp 03/14/21 0721 18     Temp 03/14/21 0721 98.7 F (37.1 C)     Temp Source 03/14/21 0721 Oral     SpO2 03/14/21 0721 91 %     Weight 03/14/21 0721 263 lb (119.3 kg)     Height 03/14/21 0721 5\' 1"  (1.549 m)     Head Circumference --      Peak Flow --      Pain Score 03/14/21 0720 9     Pain Loc --      Pain Edu? --      Excl. in [place]? --     Most recent vital signs: Vitals:   03/14/21 0721  BP: 132/87  Pulse: 79  Resp: 18  Temp: 98.7 F (37.1 C)  SpO2: 91%     Constitutional: Alert  Eyes: Conjunctivae are normal.  Head: Atraumatic. Nose: No congestion/rhinnorhea. Mouth/Throat: Mucous membranes are moist.  Bilateral tonsillar exudates and erythema consistent with pharyngitis.  Uvula is midline.  Normal phonation.  No trismus.  Not consistent with PTA Neck: Painless ROM.  Tender left anterior cervical chain lymphadenopathy Cardiovascular:   Good peripheral circulation. Respiratory: Normal respiratory effort.  No retractions.  Gastrointestinal: Soft and nontender.  Musculoskeletal:  no deformity Neurologic:  MAE spontaneously. No gross focal neurologic deficits are appreciated.  Skin:  Skin is warm, dry and intact. No rash noted. Psychiatric: Mood and affect are normal. Speech and  behavior are normal.    ED Results / Procedures / Treatments   Labs (all labs ordered are listed, but only abnormal results are displayed) Labs Reviewed  RESP PANEL BY RT-PCR (FLU A&B, COVID) [id number]     EKG     RADIOLOGY    PROCEDURES:  Critical Care performed: No  Procedures   MEDICATIONS ORDERED IN ED: Medications  ibuprofen (ADVIL) tablet 600 mg (600 mg Oral Given 03/14/21 0759)  dexamethasone (DECADRON) 10 MG/ML injection for Pediatric ORAL use 10 mg (10 mg Oral Given 03/14/21 0759)  amoxicillin-clavulanate (AUGMENTIN) 875-125 MG per tablet 1 tablet (1 tablet Oral Given 03/14/21 0903)     IMPRESSION / MDM / ASSESSMENT AND PLAN / ED COURSE  I reviewed the triage vital signs and the nursing notes.                              Differential diagnosis includes, but is not limited to, uri, strep, pharyngitis, covid, flu, parotitis, pta, rpa  Patient presenting with symptoms of URI and pharyngitis as described above.  Patient nontoxic-appearing hemodynamically stable protecting her airway.  Will test COVID and flu she does have  recent exposures.  Will give Decadron as well as Motrin.  Clinical Course as of 03/15/21 0711  Wed Mar 14, 2021  0856 Viral panel negative.  Given pharyngitis with exudates will treat empirically for bacterial pharyngitis.  She is feeling improved already after Decadron and Motrin.  Tolerating p.o.  Does appear stable for outpatient follow-up. [PR]    Clinical Course User Index [PR] Merlyn Lot, MD     FINAL CLINICAL IMPRESSION(S) / ED DIAGNOSES   Final diagnoses:  Pharyngitis, unspecified etiology     Rx / DC Orders   ED Discharge Orders          Ordered    amoxicillin-clavulanate (AUGMENTIN) 875-125 MG tablet  2 times daily        03/14/21 0855             Note:  This document was prepared using Dragon voice recognition software and may include unintentional dictation errors.    Merlyn Lot, MD 03/15/21  [phone]

## 2021-05-28 ENCOUNTER — Other Ambulatory Visit: Payer: Self-pay

## 2021-05-28 ENCOUNTER — Emergency Department: Payer: Medicare Other

## 2021-05-28 ENCOUNTER — Emergency Department
Admission: EM | Admit: 2021-05-28 | Discharge: 2021-05-28 | Disposition: A | Discharge status: Home / Self Care | Payer: Medicare Other | Attending: Emergency Medicine | Admitting: Emergency Medicine

## 2021-05-28 ENCOUNTER — Encounter: Payer: Self-pay | Admitting: *Deleted

## 2021-05-28 DIAGNOSIS — M542 Cervicalgia: Secondary | ICD-10-CM | POA: Insufficient documentation

## 2021-05-28 DIAGNOSIS — R202 Paresthesia of skin: Secondary | ICD-10-CM | POA: Insufficient documentation

## 2021-05-28 DIAGNOSIS — M545 Low back pain, unspecified: Secondary | ICD-10-CM | POA: Insufficient documentation

## 2021-05-28 LAB — URINALYSIS, ROUTINE W REFLEX MICROSCOPIC
Bilirubin Urine: NEGATIVE
Glucose, UA: NEGATIVE mg/dL
Ketones, ur: NEGATIVE mg/dL
Nitrite: NEGATIVE
Protein, ur: 30 mg/dL — AB
Specific Gravity, Urine: 1.027 (ref 1.005–1.030)
pH: 5 (ref 5.0–8.0)

## 2021-05-28 LAB — POC URINE PREG, ED: Preg Test, Ur: NEGATIVE

## 2021-05-28 IMAGING — CR DG LUMBAR SPINE 2-3V
3 series · 3 of 3 positions shown · non-contrast
Comparison: [DATE]

CLINICAL DATA: low back pain

EXAM:
LUMBAR SPINE - 2-3 VIEW

[l-spine ap]
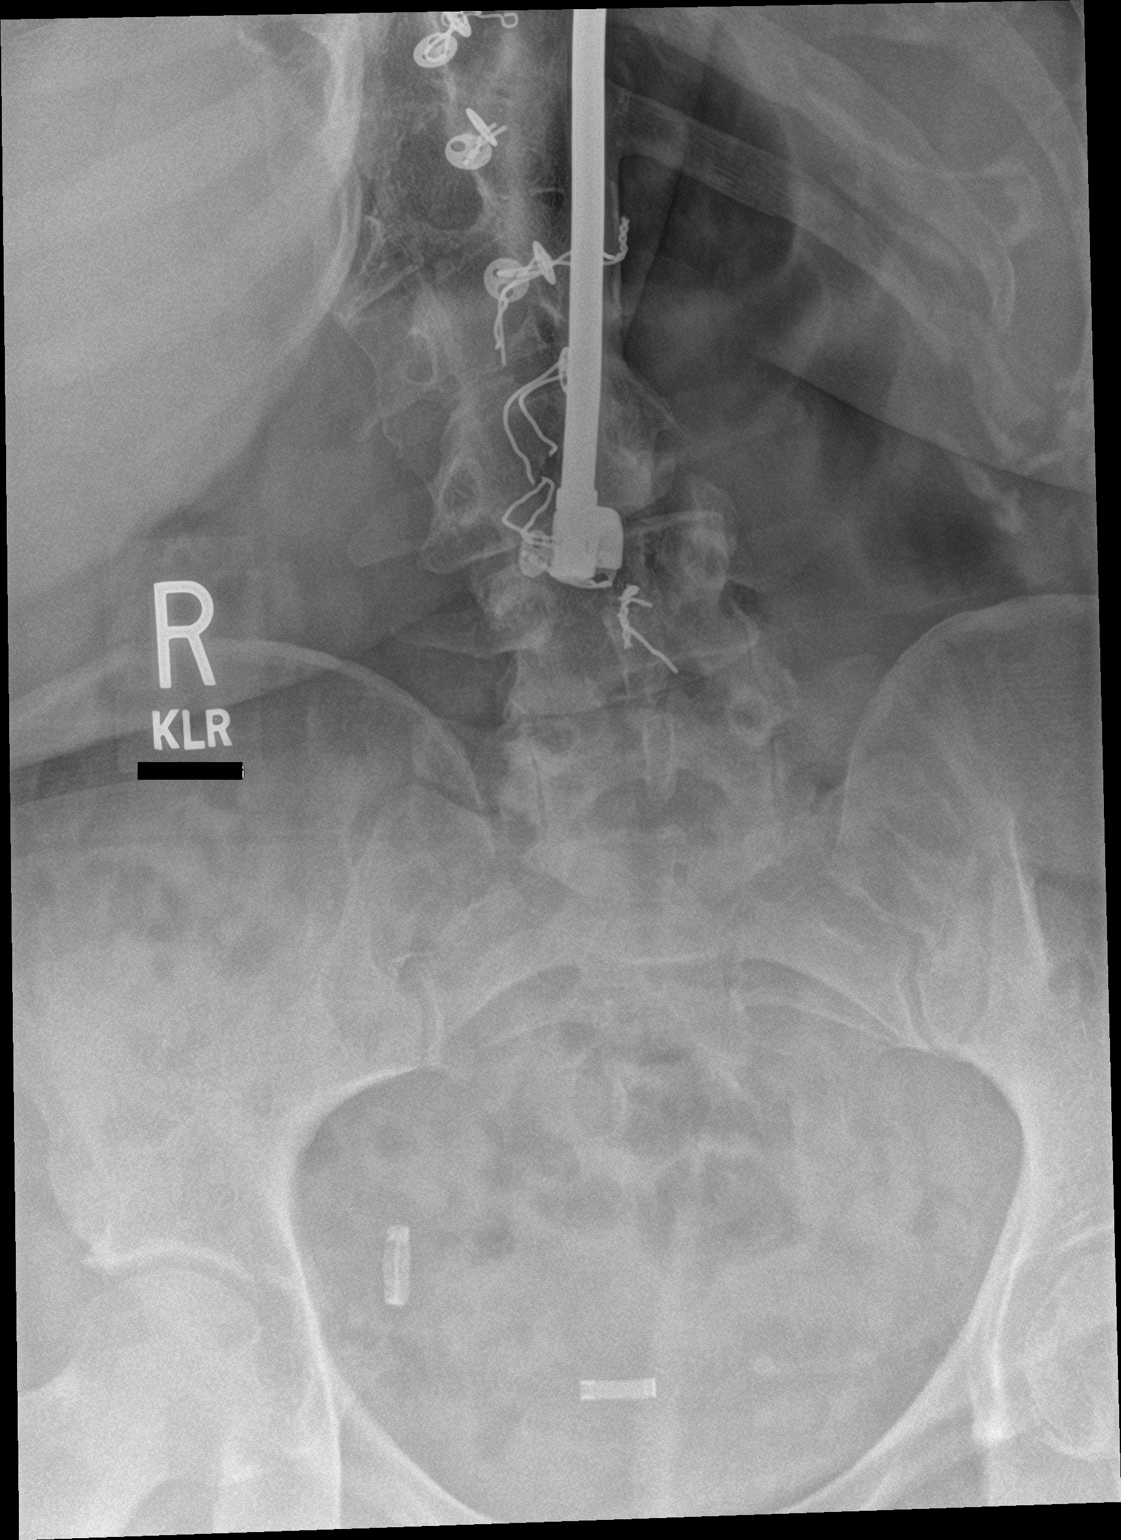

[l-spine lat]
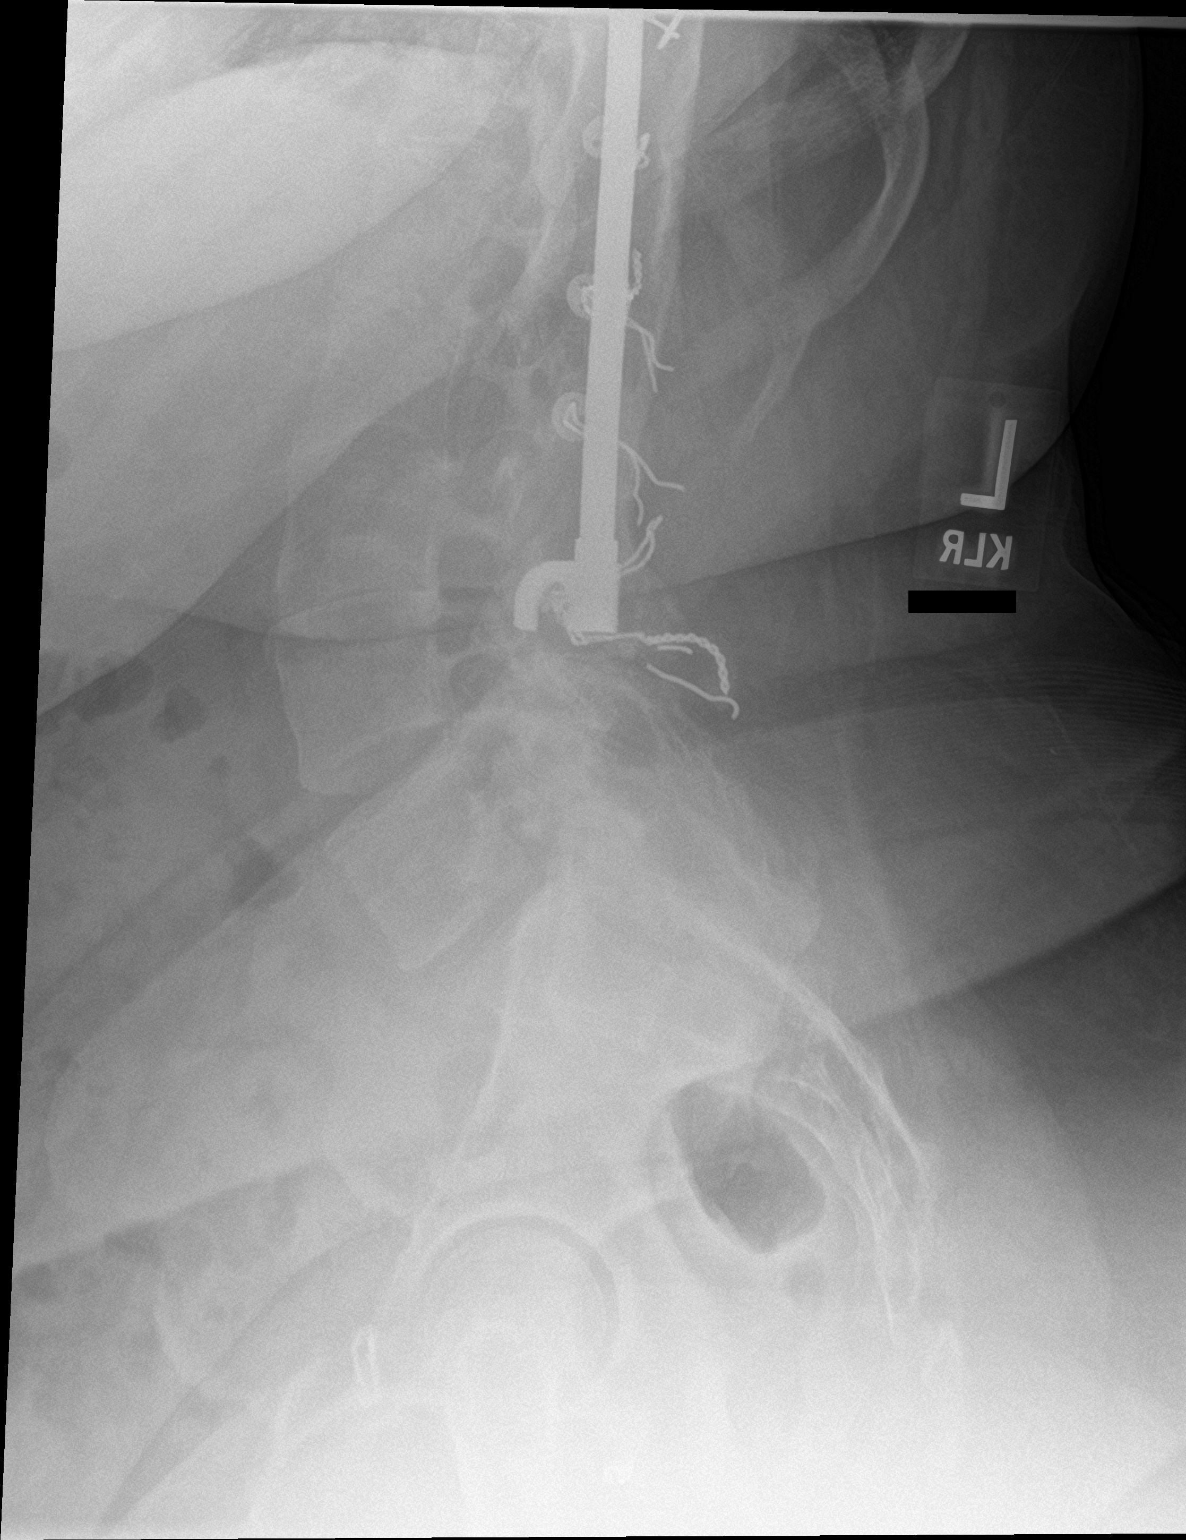

[l-spine spot]
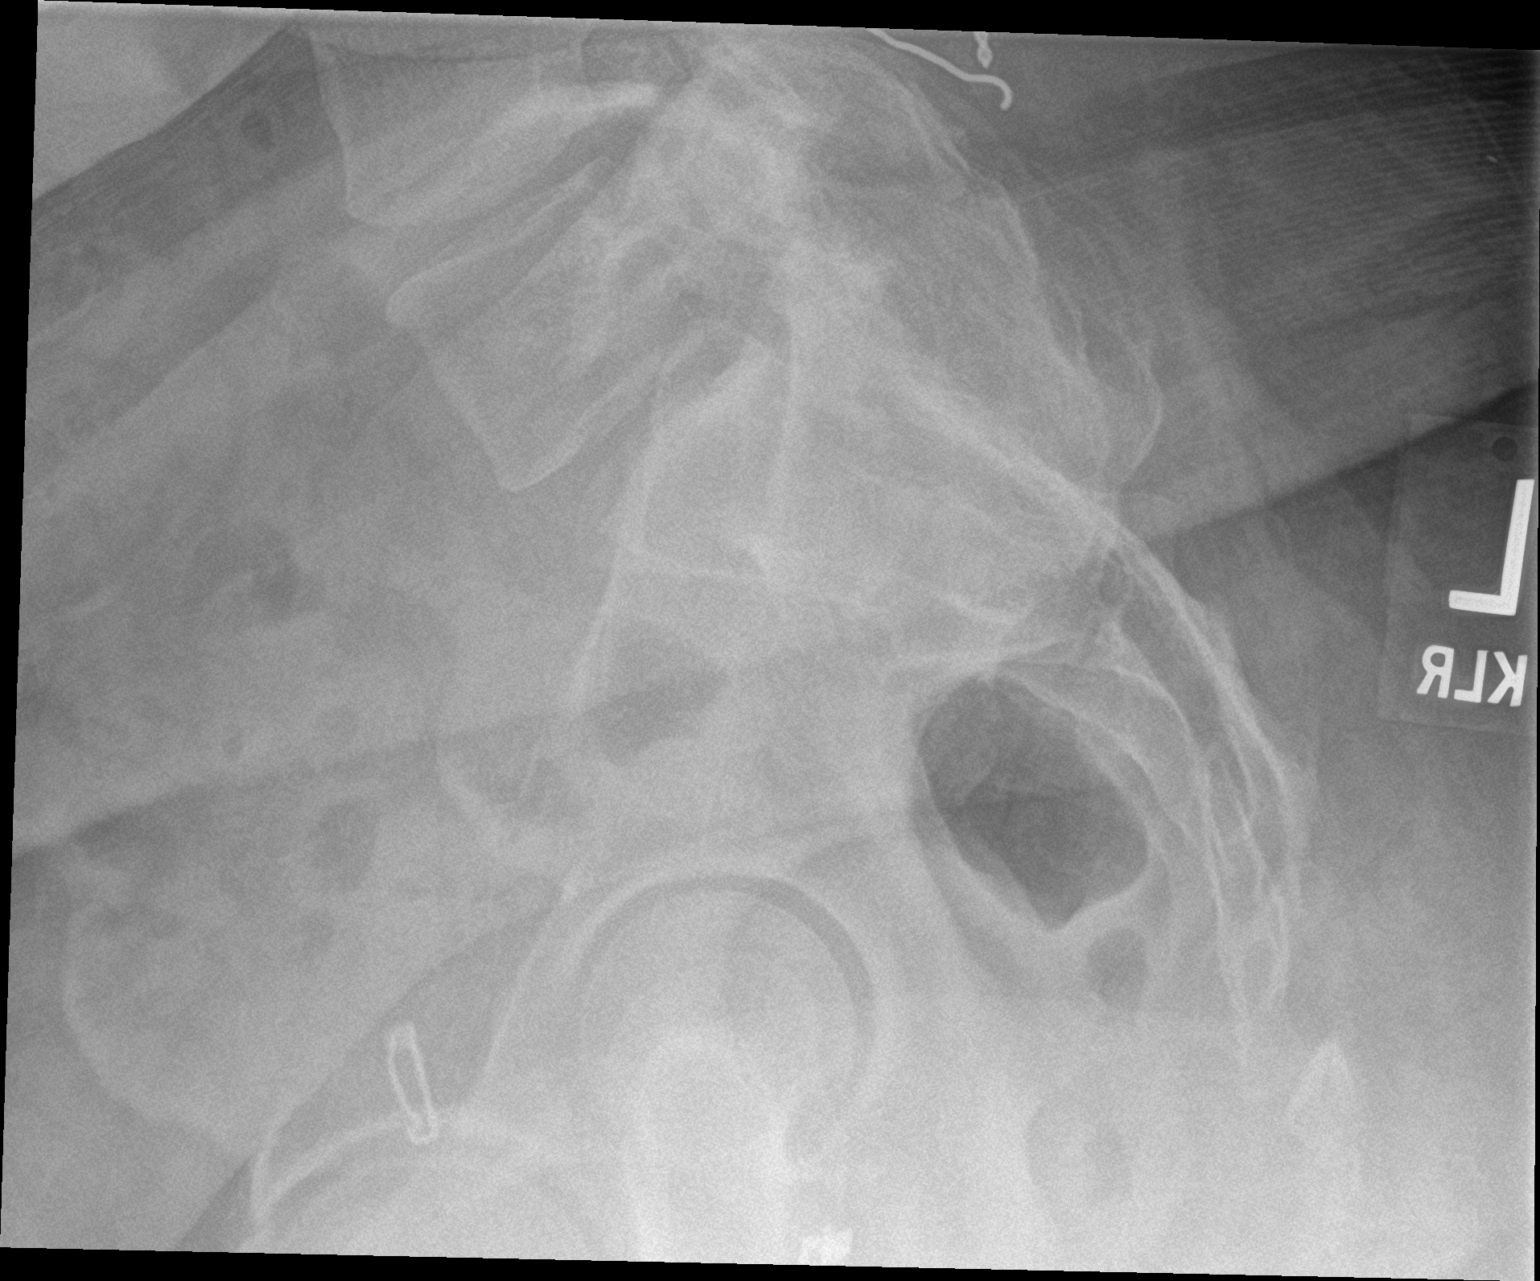

[3 of 3 positions shown; findings below may reference images not displayed]

FINDINGS: Previous posterior instrumented fusion with rod extending to the
right lamina of L4, proximal margin not visualized. solid-appearing
fusion bone involving posterior elements. A thoracolumbar
dextroscoliosis is partially visualized. No fracture or dislocation.
Facet DJD L4-S1. Tubal ligation clips are noted.
IMPRESSION: 1. No acute findings.
2. Postop and degenerative changes as above.

## 2021-05-28 IMAGING — CR DG CERVICAL SPINE 2 OR 3 VIEWS
4 series · 4 of 4 positions shown · non-contrast
Comparison: None available

CLINICAL DATA: low back pain

EXAM:
CERVICAL SPINE - 2-3 VIEW

[c-spine lat]
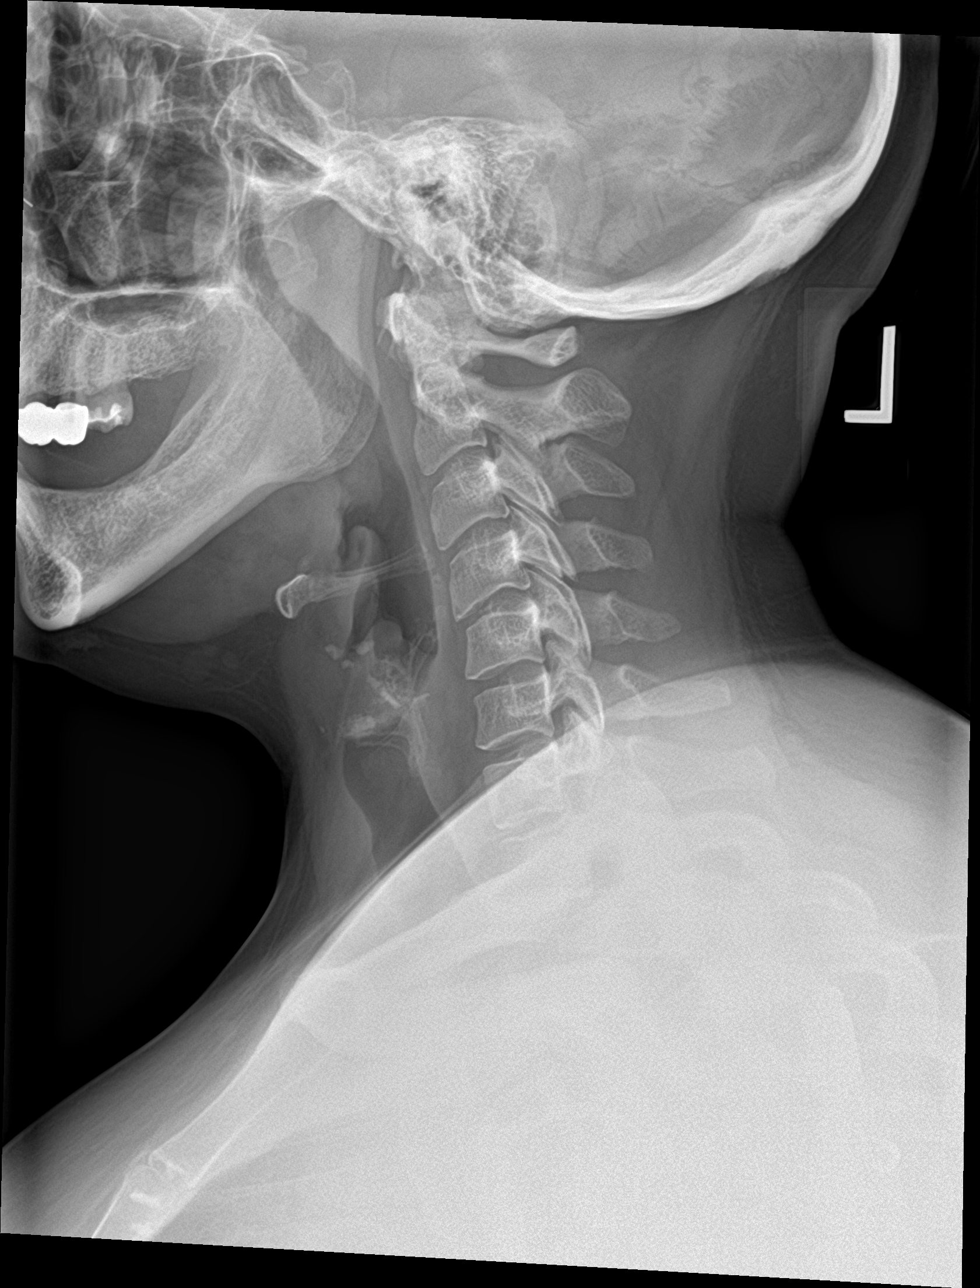

[c-spine ap]
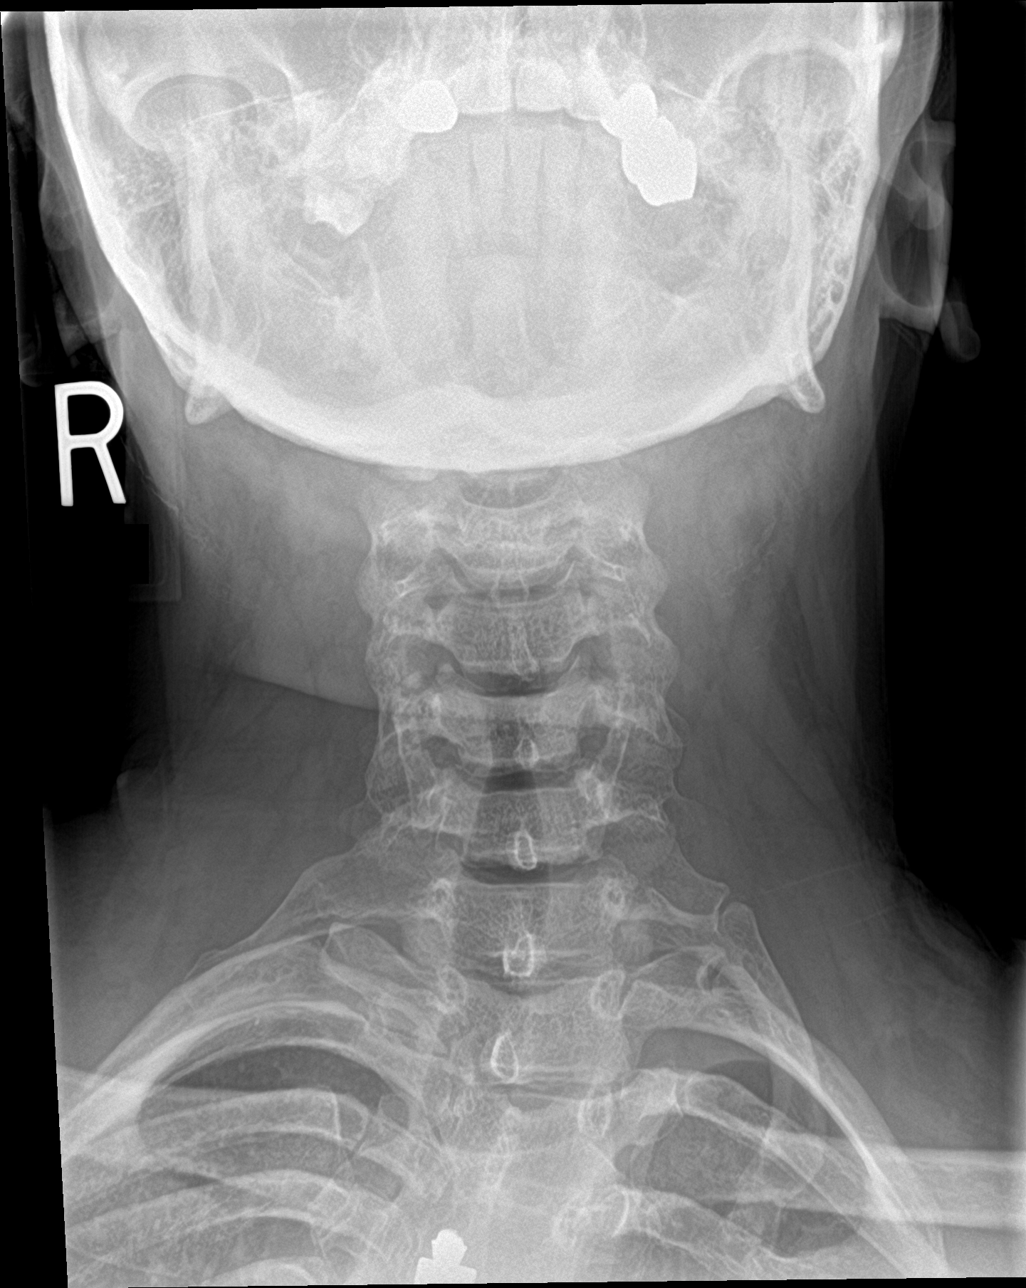

[c-spine open mouth]
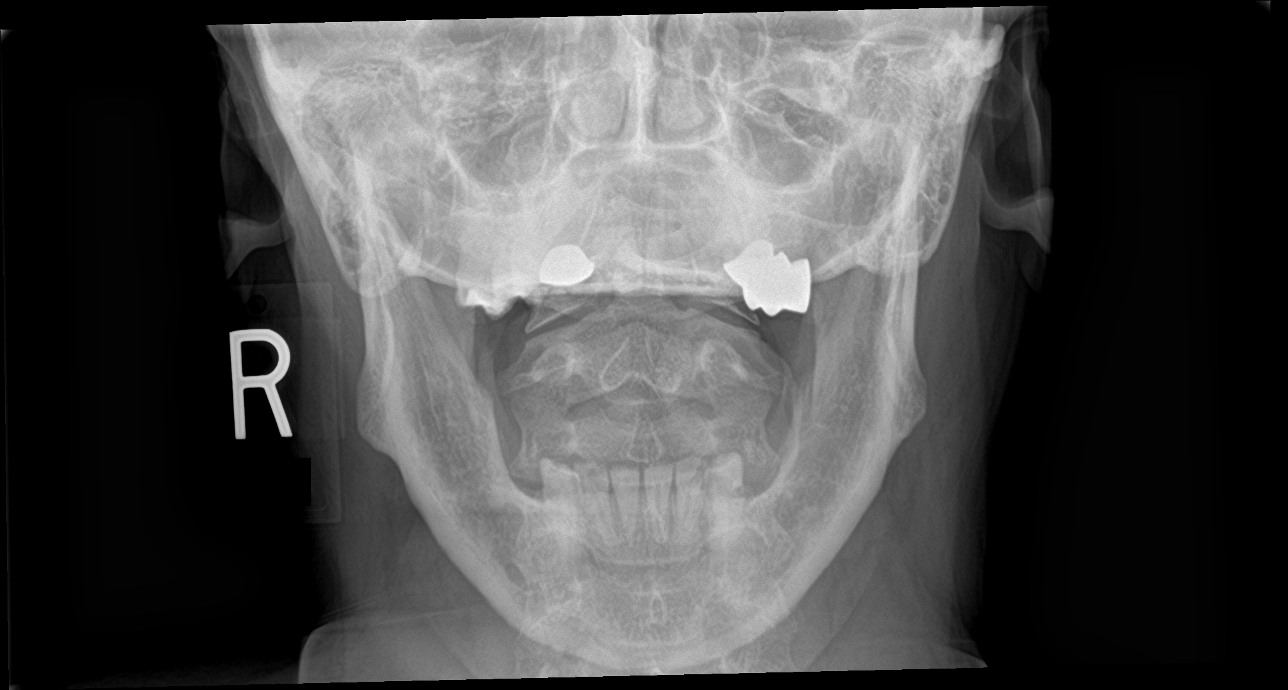

[c-spine swimmers]
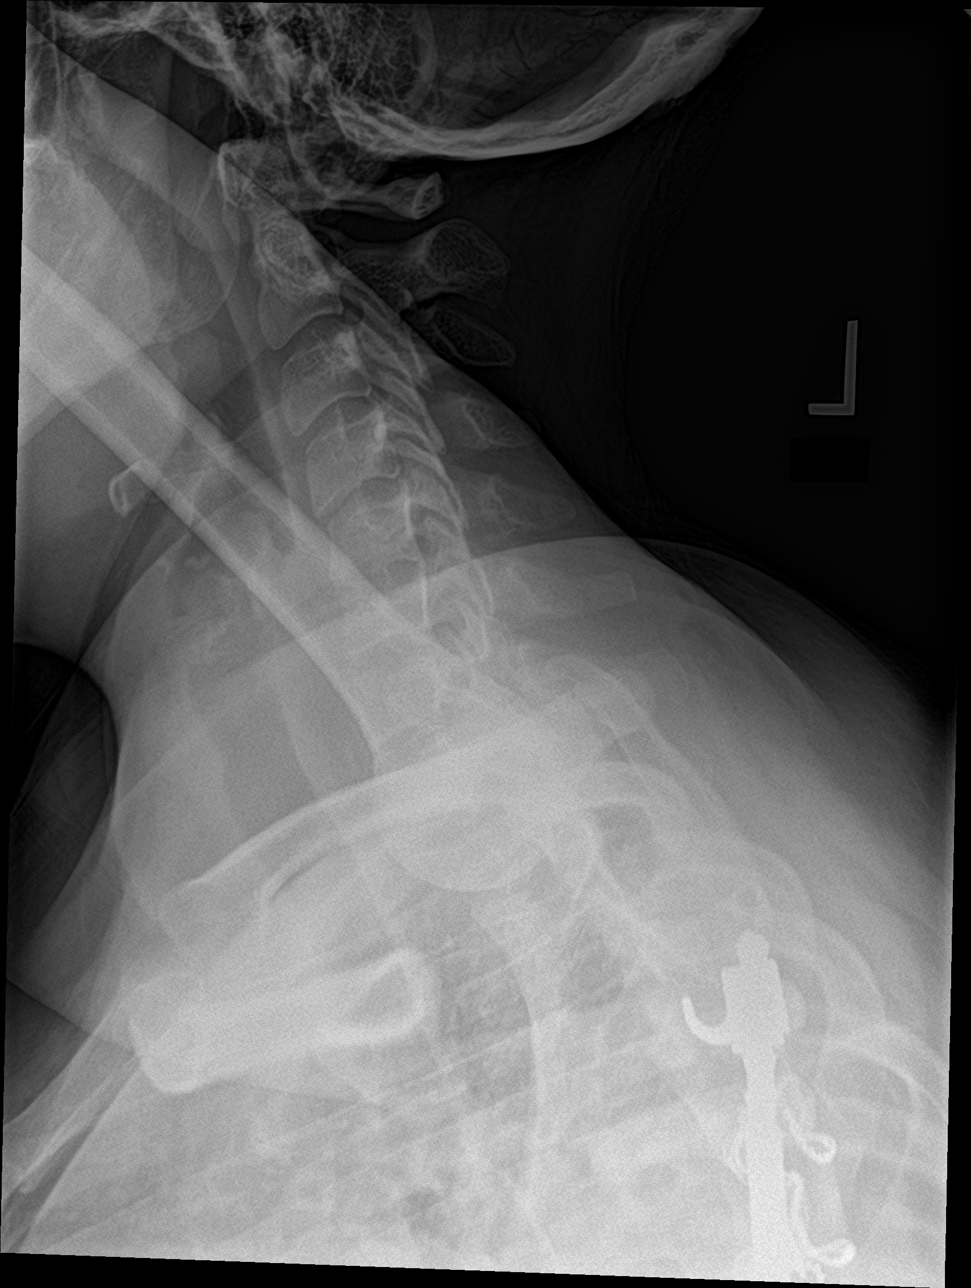

[4 of 4 positions shown; findings below may reference images not displayed]

FINDINGS: Mild reversal of the normal cervical lordosis. No fracture,
dislocation, or prevertebral soft tissue swelling. The proximal most
extent of thoracolumbar fusion hardware is noted at T5, not
visualized distally. Missing teeth and dental restorations.
IMPRESSION: No acute findings

## 2021-05-28 MED ORDER — PREDNISONE 10 MG (21) PO TBPK: ORAL_TABLET | ORAL | 0 refills | Status: DC

## 2021-05-28 MED ORDER — METHOCARBAMOL 500 MG PO TABS: 500.0000 mg | ORAL_TABLET | Freq: Three times a day (TID) | ORAL | 0 refills | Status: AC | PRN

## 2021-05-28 NOTE — ED Provider Notes (Signed)
? ?[facility] ?Provider Note ? ?Patient Contact: 4:11 PM (approximate) ? ? ?History  ? ?Back Pain and Numbness ? ? ?HPI ? ?[name] is a 41 y.o. female presents to the emergency department with bilateral low back pain.  Patient states that she has had pain for the past 7 to 10 days without bowel or bladder incontinence or saddle anesthesia.  She states that pregnancy is a possibility for her.  She also states that she has been having some right-sided neck pain and some occasional tingling in her right hand.  She denies chest pain, chest tightness or abdominal pain.  She states that she went to [facility] with similar complaints on 415 and was confused by the fact that they did not obtain x-rays. ? ?  ? ? ?Physical Exam  ? ?Triage Vital Signs: ?ED Triage Vitals  ?Enc Vitals Group  ?   BP 05/28/21 1511 129/90  ?   Pulse Rate 05/28/21 1511 76  ?   Resp 05/28/21 1511 18  ?   Temp 05/28/21 1511 98.9 ?F (37.2 ?C)  ?   Temp Source 05/28/21 1511 Oral  ?   SpO2 05/28/21 1511 95 %  ?   Weight 05/28/21 1508 265 lb (120.2 kg)  ?   Height 05/28/21 1508 5\' 1"  (1.549 m)  ?   Head Circumference --   ?   Peak Flow --   ?   Pain Score 05/28/21 1508 7  ?   Pain Loc --   ?   Pain Edu? --   ?   Excl. in [place]? --   ? ? ?Most recent vital signs: ?Vitals:  ? 05/28/21 1511  ?BP: 129/90  ?Pulse: 76  ?Resp: 18  ?Temp: 98.9 ?F (37.2 ?C)  ?SpO2: 95%  ? ? ? ?General: Alert and in no acute distress. ?Eyes:  PERRL. EOMI. ?Head: No acute traumatic findings ?ENT: ?     Ears:  ?     Nose: No congestion/rhinnorhea. ?     Mouth/Throat: Mucous membranes are moist.  ?Neck: No stridor. No cervical spine tenderness to palpation. ?Cardiovascular:  Good peripheral perfusion ?Respiratory: Normal respiratory effort without tachypnea or retractions. Lungs CTAB. Good air entry to the bases with no decreased or absent breath sounds. ?Gastrointestinal: Bowel sounds ?4 quadrants. Soft and nontender to palpation. No guarding or rigidity.  No palpable masses. No distention. No CVA tenderness. ?Musculoskeletal: Full range of motion to all extremities.  Patient has left-sided paraspinal muscle tenderness to palpation along the lumbar spine. ?Neurologic:  No gross focal neurologic deficits are appreciated.  ?Skin:   No rash noted ?Other: ? ? ?ED Results / Procedures / Treatments  ? ?Labs ?(all labs ordered are listed, but only abnormal results are displayed) ?Labs Reviewed  ?URINALYSIS, ROUTINE W REFLEX MICROSCOPIC - Abnormal; Notable for the following components:  ?    Result Value  ? Color, Urine AMBER (*)   ? APPearance CLOUDY (*)   ? Hgb urine dipstick SMALL (*)   ? Protein, ur 30 (*)   ? Leukocytes,Ua SMALL (*)   ? Bacteria, UA RARE (*)   ? All other components within normal limits  ?POC URINE PREG, ED  ? ? ? ? ?RADIOLOGY ? ?I personally viewed and evaluated these images as part of my medical decision making, as well as reviewing the written report by the radiologist. ? ?ED Provider Interpretation: I personally reviewed x-ray of the lumbar spine which showed no acute bony  abnormality.  I personally reviewed x-ray of the cervical spine which showed no acute abnormality.  I agree with radiologist interpretation for both. ? ? ?PROCEDURES: ? ?Critical Care performed: No ? ?Procedures ? ? ?MEDICATIONS ORDERED IN ED: ?Medications - No data to display ? ? ?IMPRESSION / MDM / ASSESSMENT AND PLAN / ED COURSE  ?I reviewed the triage vital signs and the nursing notes. ?             ?               ? ?Assessment and plan ?Neck pain ?Low back pain ?41 year old female presents to the emergency department with persistent low back pain and new right-sided neck pain with occasional tingling in the right hand. ? ?X-rays of the cervical and lumbar spine show no acute bony abnormality. ? ?We will discharge patient with tapered prednisone and Robaxin. ? ?FINAL CLINICAL IMPRESSION(S) / ED DIAGNOSES  ? ?Final diagnoses:  ?Acute low back pain without sciatica, unspecified  back pain laterality  ?Neck pain  ? ? ? ?Rx / DC Orders  ? ?ED Discharge Orders   ? ?      Ordered  ?  predniSONE (STERAPRED UNI-PAK 21 TAB) 10 MG (21) TBPK tablet       ? 05/28/21 1715  ?  methocarbamol (ROBAXIN) 500 MG tablet  Every 8 hours PRN       ? 05/28/21 1715  ? ?  ?  ? ?  ? ? ? ?Note:  This document was prepared using Dragon voice recognition software and may include unintentional dictation errors. ?  ?Orvil Feil, PA-C ?05/28/21 1907 ? ?  ?Gilles Chiquito, MD ?05/28/21 2304 ? ?

## 2021-05-28 NOTE — Discharge Instructions (Addendum)
You can take prednisone as directed. ?You can take Robaxin up to 3 times daily.  ?

## 2021-05-28 NOTE — ED Triage Notes (Signed)
Pt has lower back pain and numbness in right hand ,arm, shoulder and neck.  Pt also has intermittent h/a's.  Pt taking otc meds without relief.  Pt was seen at [facility] 2 weeks ago.  Pt alert, speech clear.  ?

## 2021-05-28 NOTE — ED Notes (Signed)
See triage note. P reports HA, back pain and numbness in right hand/arm/shoulder. Pt ambulatory to triage.  ?

## 2021-07-19 ENCOUNTER — Emergency Department: Payer: Medicare Other

## 2021-07-19 ENCOUNTER — Emergency Department
Admission: EM | Admit: 2021-07-19 | Discharge: 2021-07-19 | Disposition: A | Discharge status: Home / Self Care | Payer: Medicare Other | Attending: Emergency Medicine | Admitting: Emergency Medicine

## 2021-07-19 ENCOUNTER — Other Ambulatory Visit: Payer: Self-pay

## 2021-07-19 DIAGNOSIS — Z20822 Contact with and (suspected) exposure to covid-19: Secondary | ICD-10-CM | POA: Diagnosis not present

## 2021-07-19 DIAGNOSIS — I509 Heart failure, unspecified: Secondary | ICD-10-CM | POA: Diagnosis not present

## 2021-07-19 DIAGNOSIS — R519 Headache, unspecified: Secondary | ICD-10-CM | POA: Diagnosis not present

## 2021-07-19 DIAGNOSIS — R55 Syncope and collapse: Secondary | ICD-10-CM | POA: Diagnosis not present

## 2021-07-19 LAB — CBC WITH DIFFERENTIAL/PLATELET
Abs Immature Granulocytes: 0.05 10*3/uL (ref 0.00–0.07)
Basophils Absolute: 0.1 10*3/uL (ref 0.0–0.1)
Basophils Relative: 1 %
Eosinophils Absolute: 0.5 10*3/uL (ref 0.0–0.5)
Eosinophils Relative: 5 %
HCT: 36.1 % (ref 36.0–46.0)
Hemoglobin: 10.8 g/dL — ABNORMAL LOW (ref 12.0–15.0)
Immature Granulocytes: 1 %
Lymphocytes Relative: 38 %
Lymphs Abs: 3.3 10*3/uL (ref 0.7–4.0)
MCH: 24.3 pg — ABNORMAL LOW (ref 26.0–34.0)
MCHC: 29.9 g/dL — ABNORMAL LOW (ref 30.0–36.0)
MCV: 81.1 fL (ref 80.0–100.0)
Monocytes Absolute: 0.6 10*3/uL (ref 0.1–1.0)
Monocytes Relative: 7 %
Neutro Abs: 4.4 10*3/uL (ref 1.7–7.7)
Neutrophils Relative %: 48 %
Platelets: 242 10*3/uL (ref 150–400)
RBC: 4.45 MIL/uL (ref 3.87–5.11)
RDW: 14.6 % (ref 11.5–15.5)
WBC: 8.8 10*3/uL (ref 4.0–10.5)
nRBC: 0 % (ref 0.0–0.2)

## 2021-07-19 LAB — BASIC METABOLIC PANEL
Anion gap: 6 (ref 5–15)
BUN: 14 mg/dL (ref 6–20)
CO2: 24 mmol/L (ref 22–32)
Calcium: 9.1 mg/dL (ref 8.9–10.3)
Chloride: 109 mmol/L (ref 98–111)
Creatinine, Ser: 0.63 mg/dL (ref 0.44–1.00)
GFR, Estimated: >60 mL/min (ref >60)
Glucose, Bld: 98 mg/dL (ref 70–99)
Potassium: 3.9 mmol/L (ref 3.5–5.1)
Sodium: 139 mmol/L (ref 135–145)

## 2021-07-19 LAB — TROPONIN I (HIGH SENSITIVITY)
Troponin I (High Sensitivity): <2 ng/L (ref <18)
Troponin I (High Sensitivity): <2 ng/L (ref <18)

## 2021-07-19 LAB — SARS CORONAVIRUS 2 BY RT PCR: SARS Coronavirus 2 by RT PCR: NEGATIVE

## 2021-07-19 IMAGING — CT CT ANGIO HEAD
2 of 9 series · 17 of 47 positions shown · IV contrast (APPLIED)
Comparison: No prior CTA, correlation is made with CT head
[DATE]

CLINICAL DATA: Cerebral aneurysm screening, high-risk; headache

EXAM:
CT ANGIOGRAPHY HEAD
TECHNIQUE: Multidetector CT imaging of the head was performed using the
standard protocol during bolus administration of intravenous
contrast. Multiplanar CT image reconstructions and MIPs were
obtained to evaluate the vascular anatomy.

[Series 6: headangio 1.0 mpr ax · axial · 0.38mm/px · z∈[-212,-80]mm · 14 of 156 slices shown]
[im 10/156  brain]
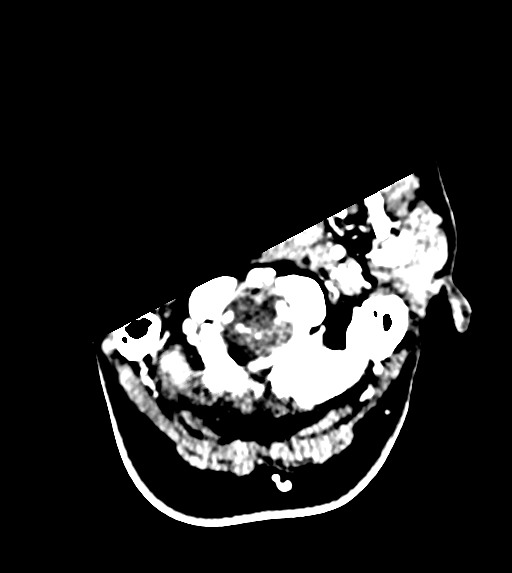
[im 20/156  bone]
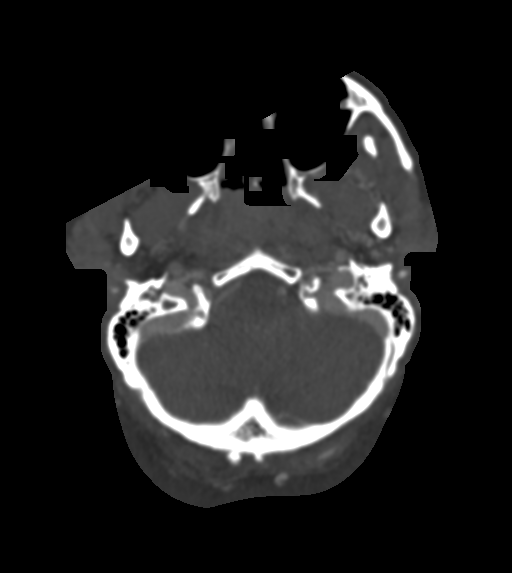
[im 30/156  brain]
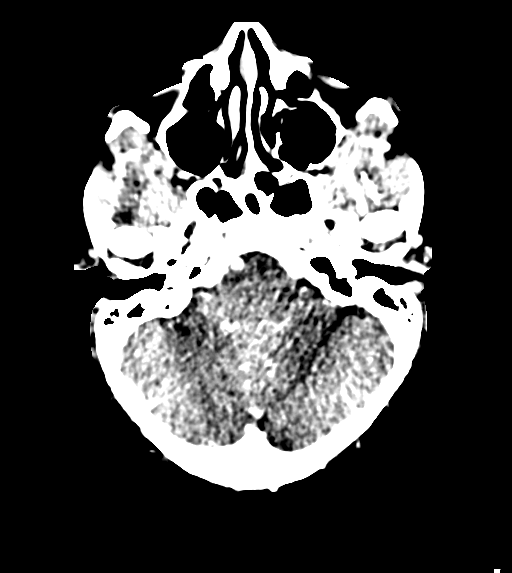
[im 39/156  bone]
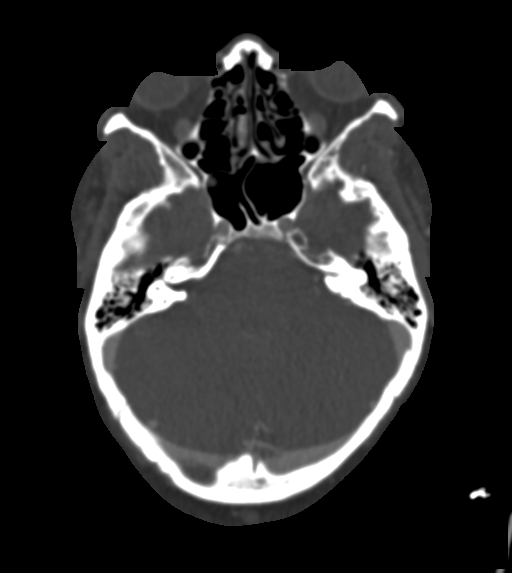
[im 49/156  brain]
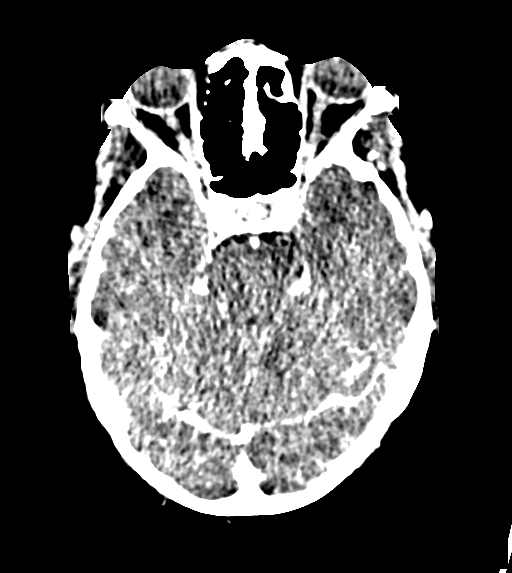
[im 59/156  bone]
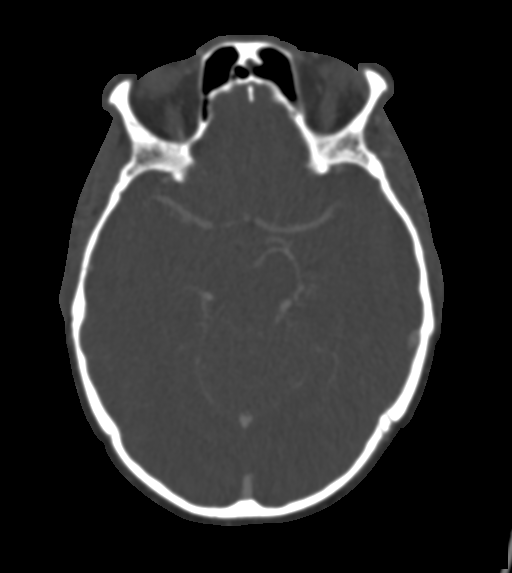
[im 68/156  brain]
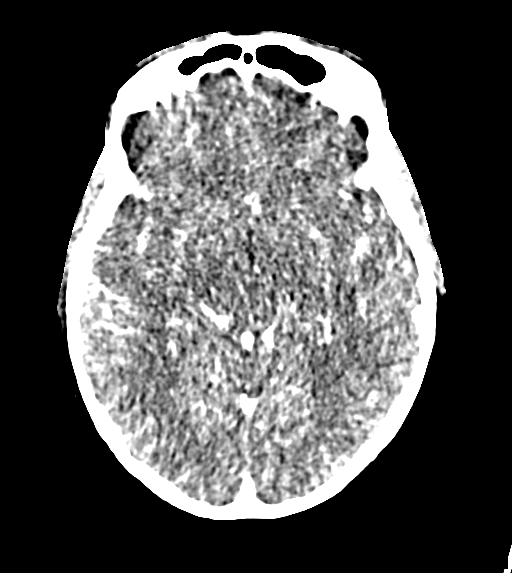
[im 88/156  bone]
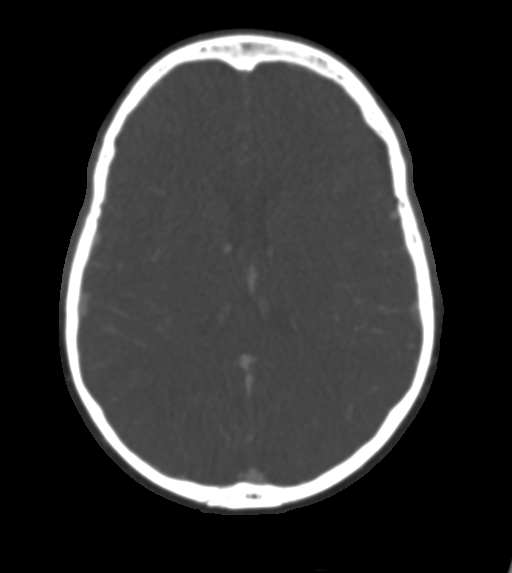
[im 97/156  brain]
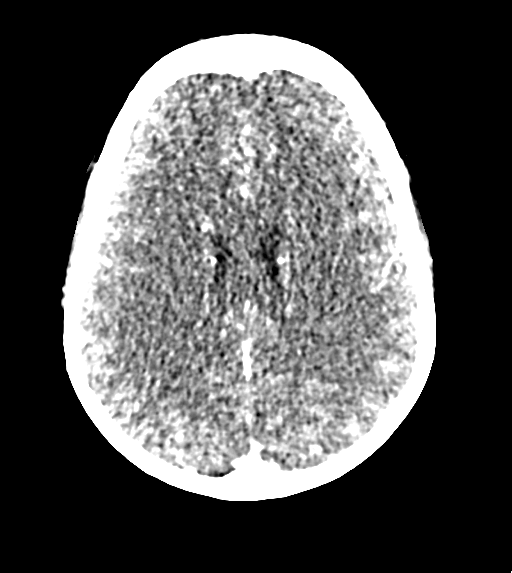
[im 107/156  bone]
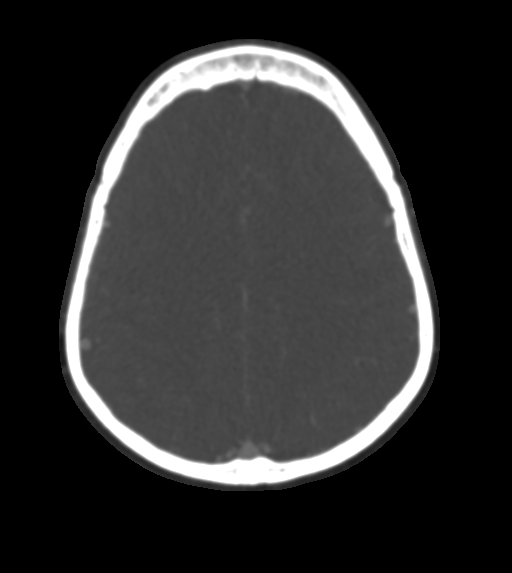
[im 117/156  brain]
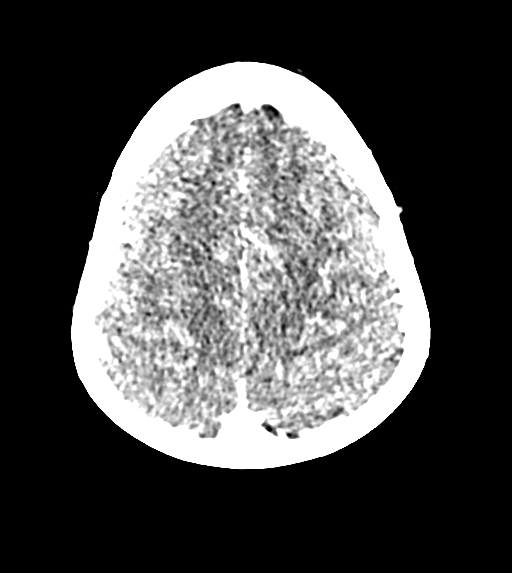
[im 126/156  bone]
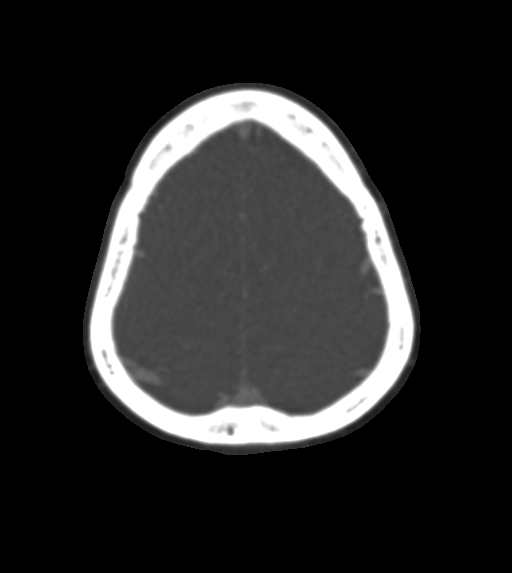
[im 136/156  brain]
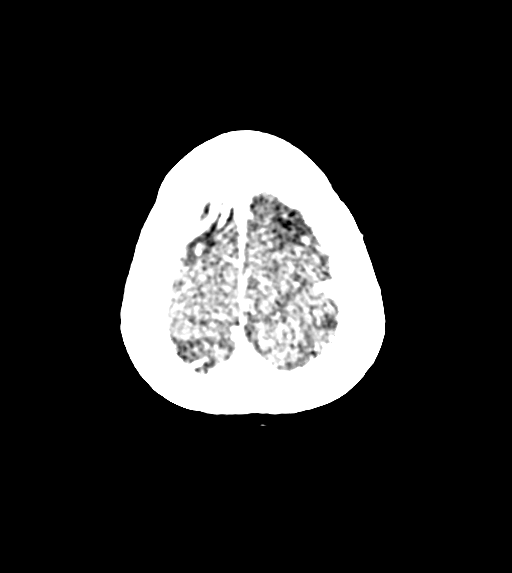
[im 146/156  bone]
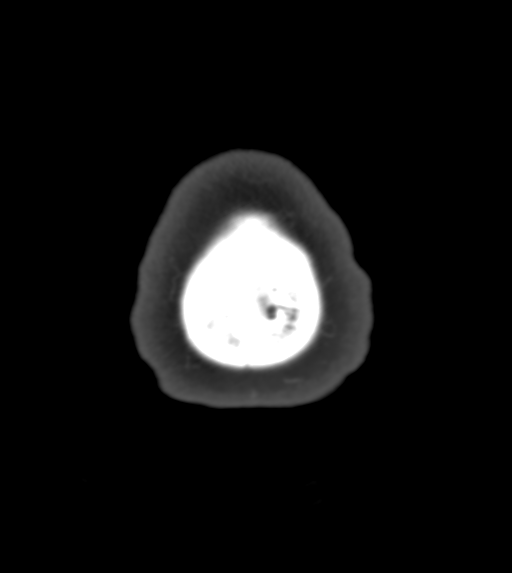

[Series 8: headangio 1.0 mpr cor · coronal · 0.33mm/px · 3 of 191 slices shown]
[im 55/191  brain]
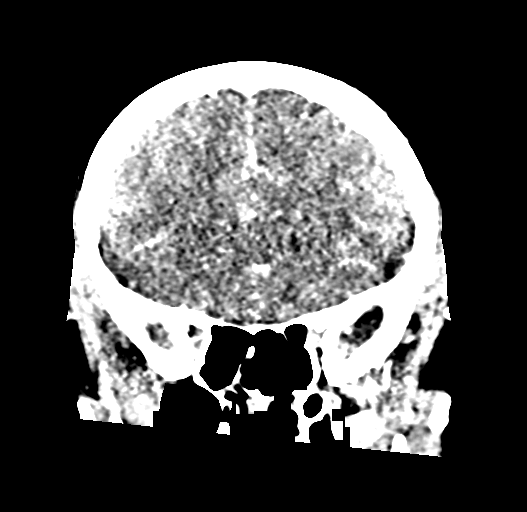
[im 82/191  brain]
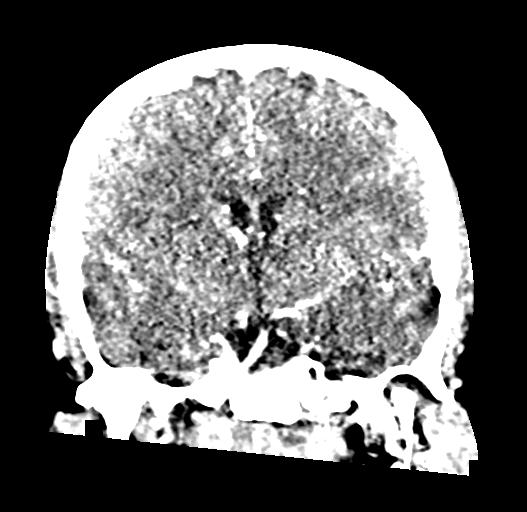
[im 109/191  brain]
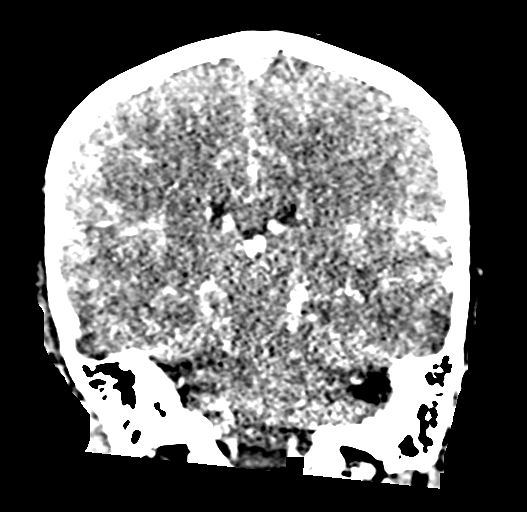

[17 of 47 positions shown; findings below may reference images not displayed]

RADIATION DOSE REDUCTION: This exam was performed according to the
departmental dose-optimization program which includes automated
exposure control, adjustment of the mA and/or kV according to
patient size and/or use of iterative reconstruction technique.

CONTRAST:  75mL OMNIPAQUE IOHEXOL 350 MG/ML SOLN
FINDINGS: CT HEAD FINDINGS

For noncontrast findings, please see same day CT head.

CTA HEAD FINDINGS

Anterior circulation: Both internal carotid arteries are patent to
the termini, without significant stenosis.

A1 segments patent. Normal anterior communicating artery. Anterior
cerebral arteries are patent to their distal aspects.

No M1 stenosis or occlusion. Normal MCA bifurcations. Distal MCA
branches perfused and symmetric.

Posterior circulation: Vertebral arteries patent to the
vertebrobasilar junction without stenosis. Posterior inferior
cerebellar arteries patent proximally.

Basilar patent to its distal aspect. Superior cerebellar arteries
patent proximally.

Patent P1 segments. PCAs perfused to their distal aspects without
stenosis. The bilateral posterior communicating arteries are
visualized.

Venous sinuses: Patent.

Anatomic variants: None significant.

Review of the MIP images confirms the above findings
IMPRESSION: No intracranial large vessel occlusion or significant stenosis. No
evidence of aneurysm.

## 2021-07-19 IMAGING — DX DG CHEST 1V PORT
1 series · 1 of 1 positions shown · non-contrast
Comparison: [DATE]

CLINICAL DATA: Syncopal episode

EXAM:
PORTABLE CHEST 1 VIEW

[chest ap]
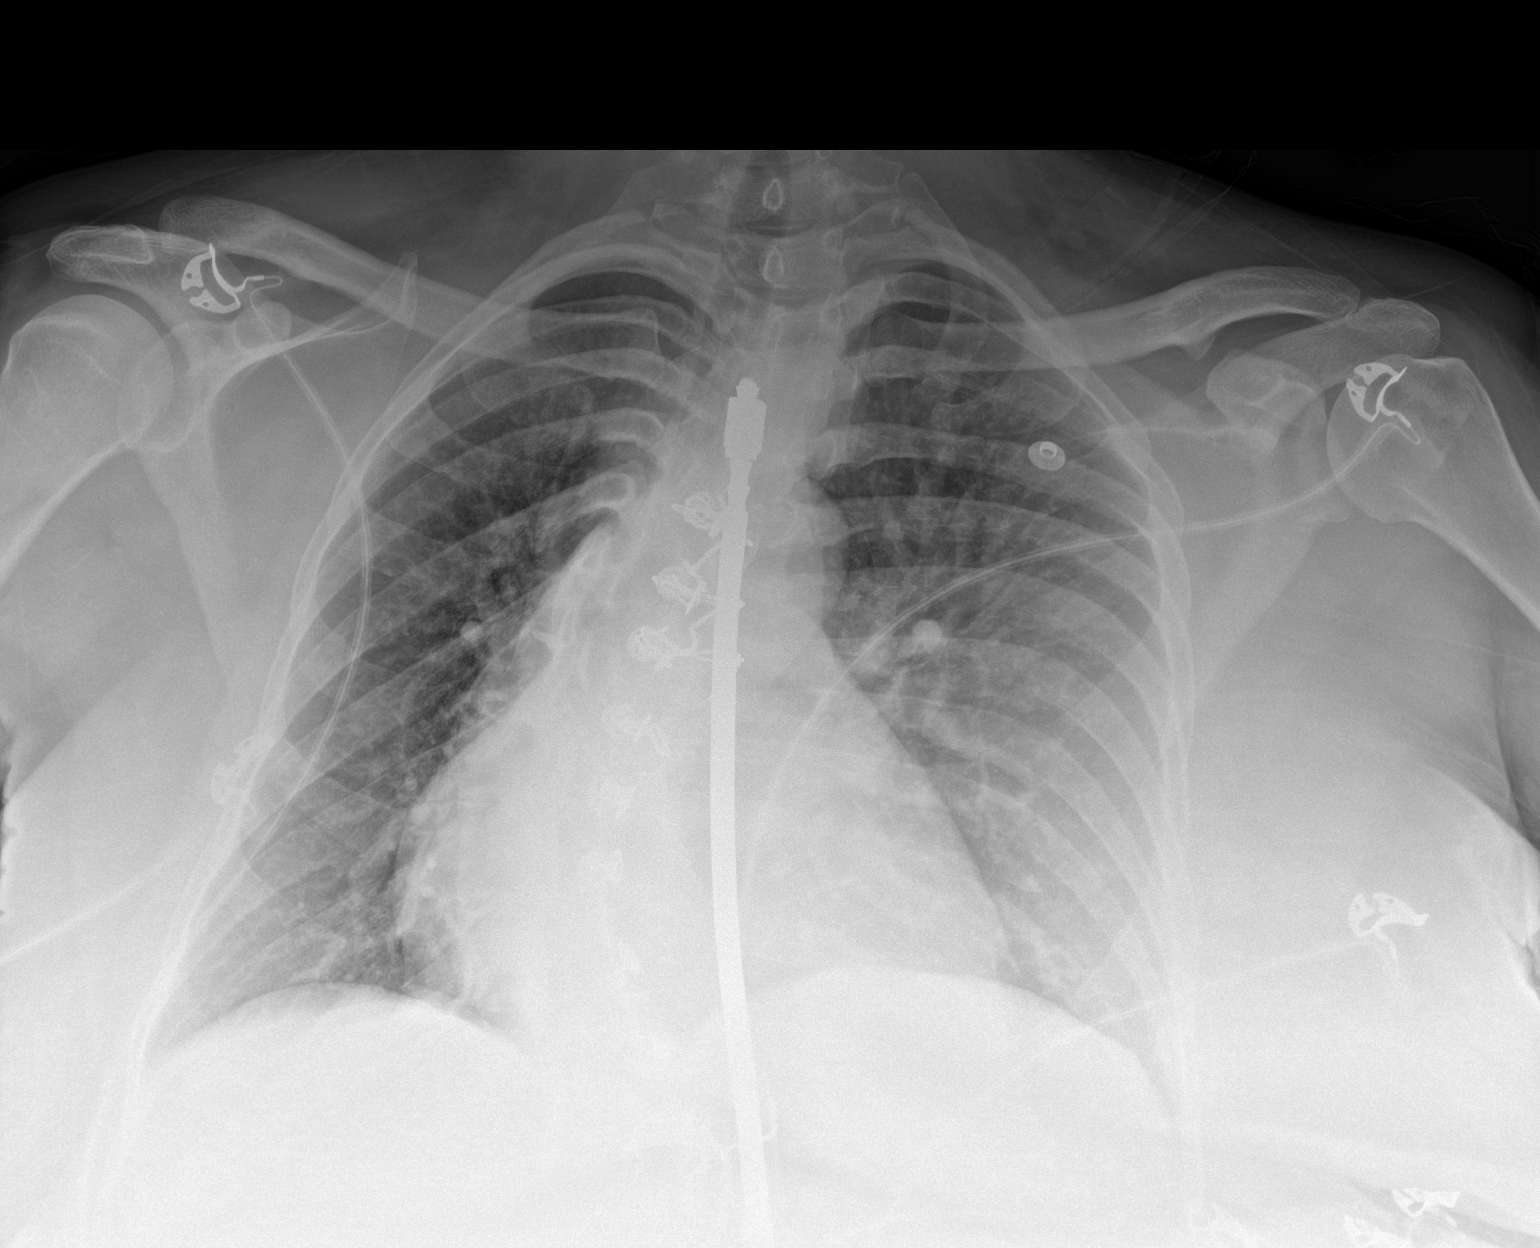

[1 of 1 positions shown; findings below may reference images not displayed]

FINDINGS: Cardiac shadow remains enlarged. Postsurgical changes are again seen
in the thoracic spine and stable. Lungs are clear. No acute bony
abnormality is noted.
IMPRESSION: Stable scoliosis with surgical fixation.

No acute abnormality noted.

## 2021-07-19 IMAGING — CT CT HEAD W/O CM
4 series · 16 of 47 positions shown, 18 images · non-contrast
Comparison: Head CT dated [DATE].

CLINICAL DATA: Headache.



[Series 2: head wo · axial · 0.48mm/px · z∈[-168,-48]mm · 7 of 33 slices shown, 9 images]
[im 5/33  brain]
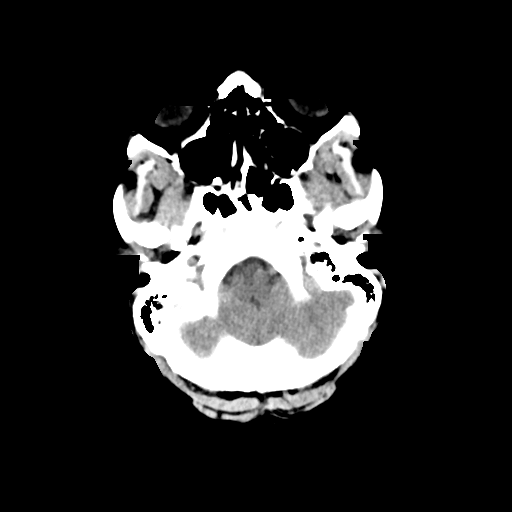
[im 5/33  bone]
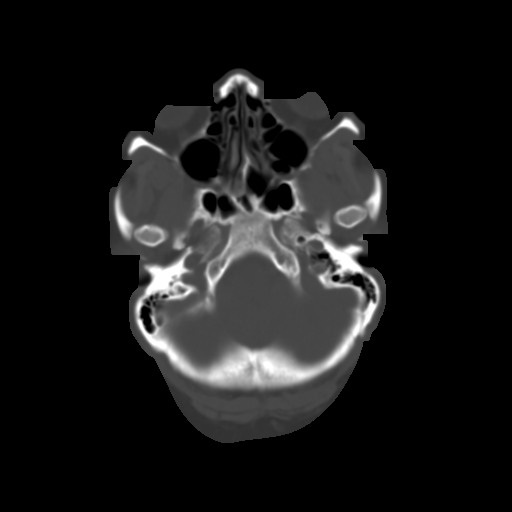
[im 9/33  brain]
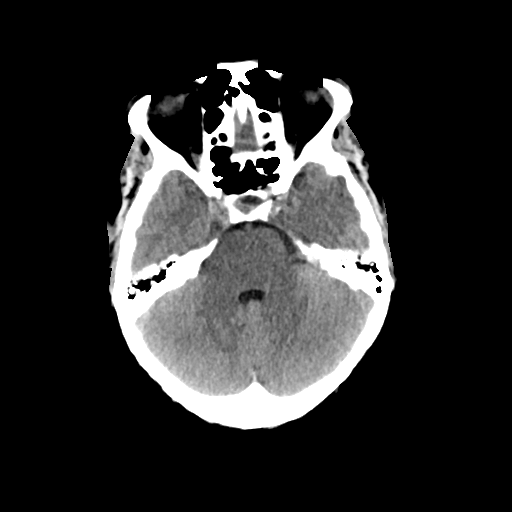
[im 13/33  brain]
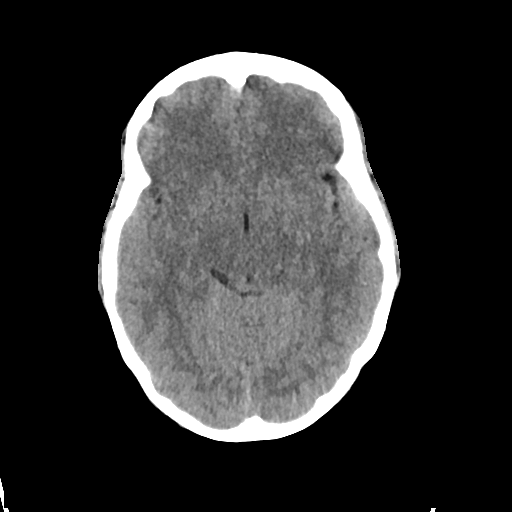
[im 17/33  brain]
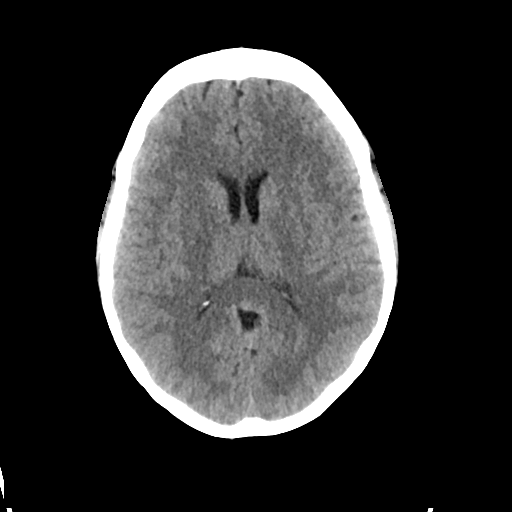
[im 21/33  brain]
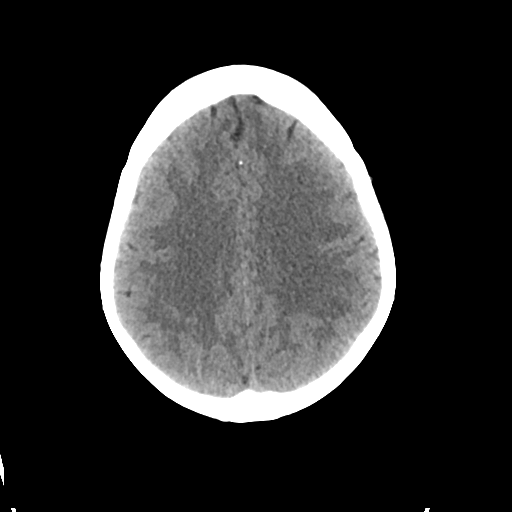
[im 21/33  bone]
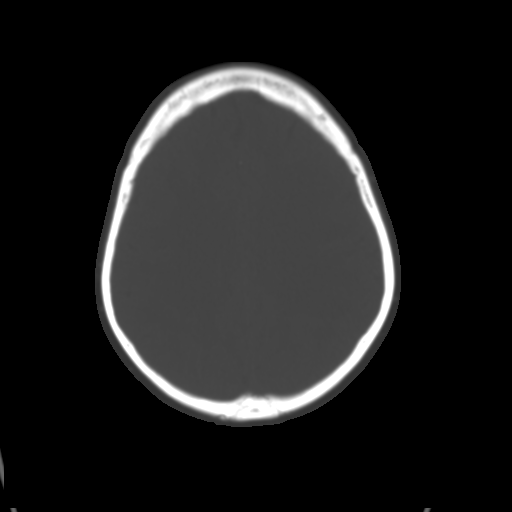
[im 25/33  brain]
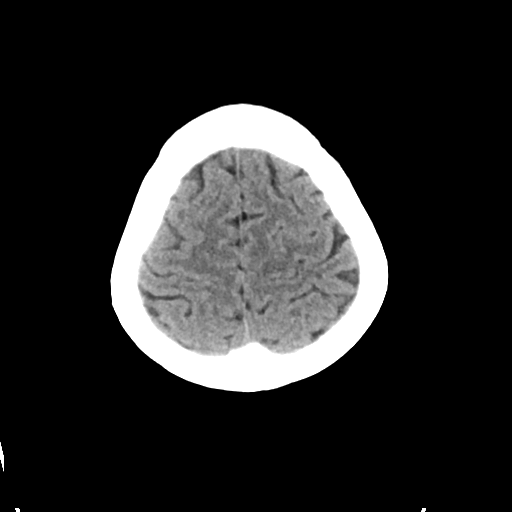
[im 29/33  brain]
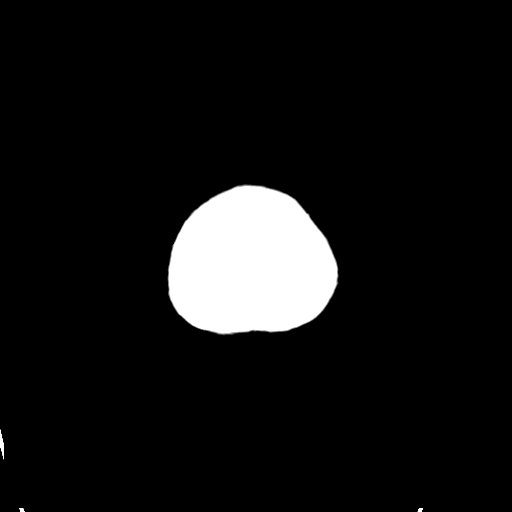

[Series 3: head bone · axial · 0.48mm/px · z∈[-172,-140]mm · 3 of 81 slices shown]
[im 9/81  bone]
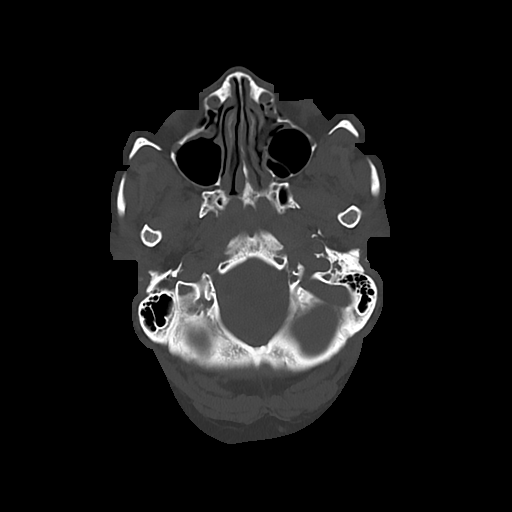
[im 17/81  bone]
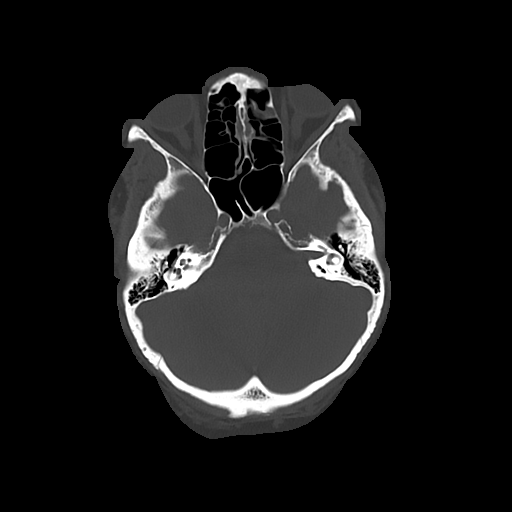
[im 25/81  bone]
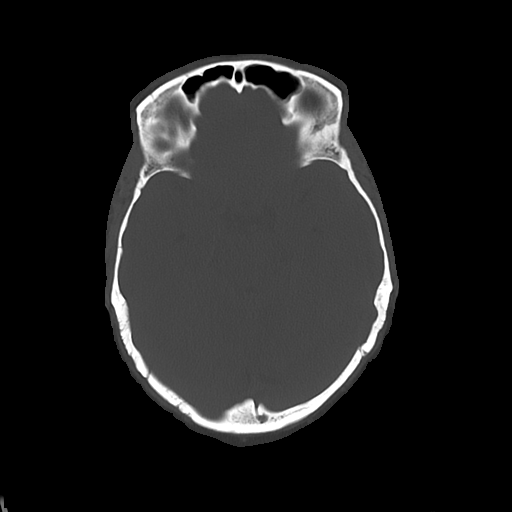

[Series 4: cor soft · coronal · 0.32mm/px · 3 of 66 slices shown]
[im 22/66  brain]
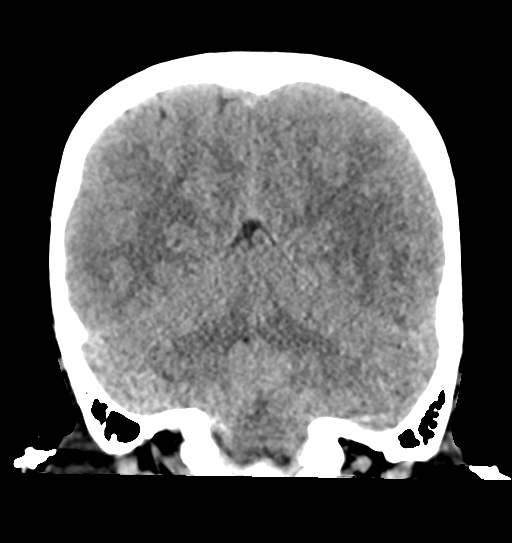
[im 29/66  brain]
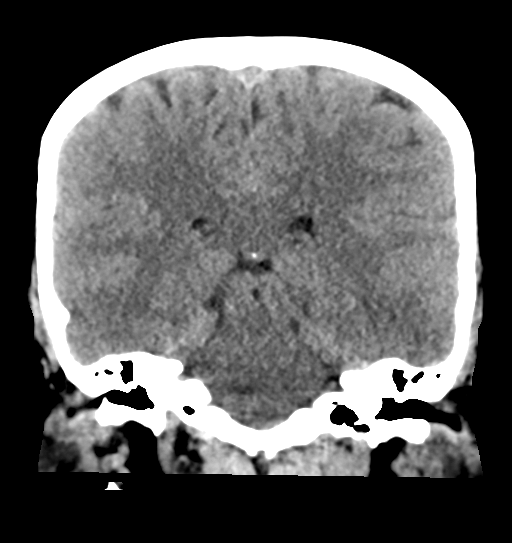
[im 37/66  brain]
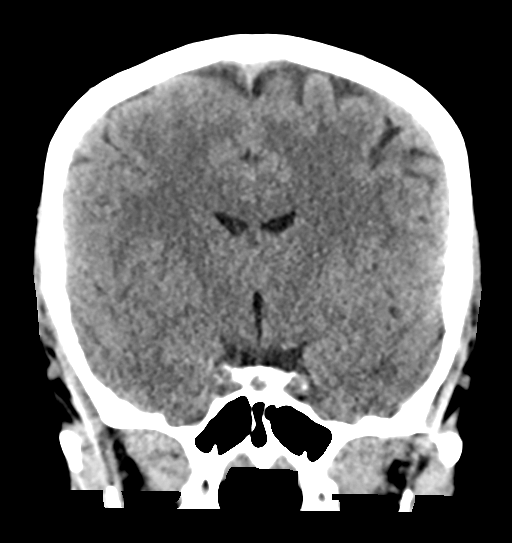

[Series 5: sag soft · sagittal · 0.32mm/px · 3 of 50 slices shown]
[im 17/50  brain]
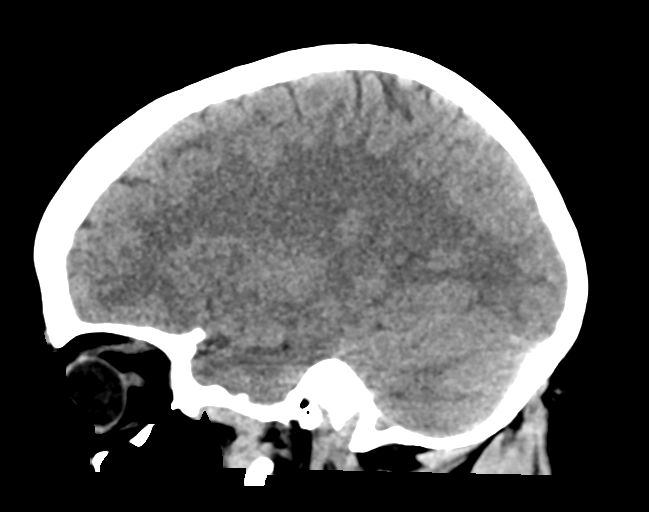
[im 25/50  brain]
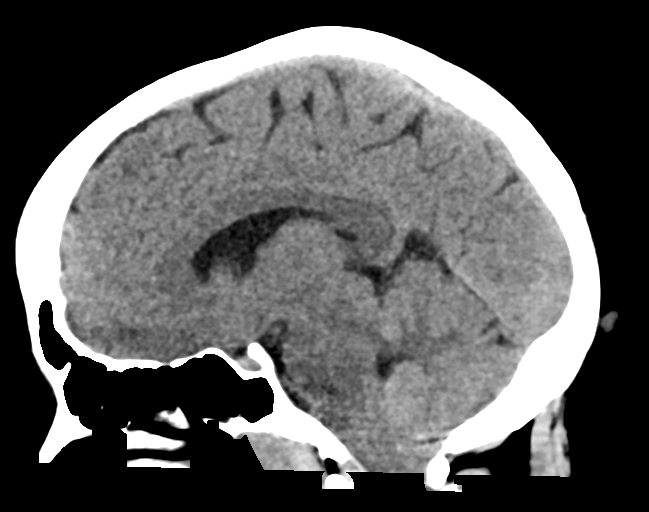
[im 33/50  brain]
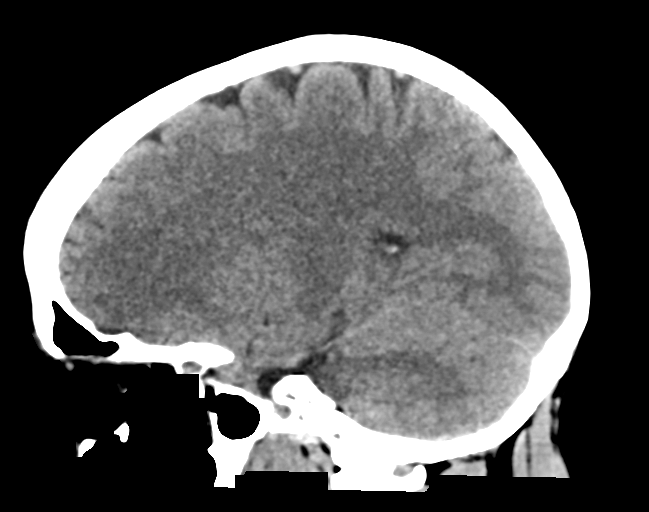

[16 of 47 positions shown; findings below may reference images not displayed]

FINDINGS: Brain: The ventricles and sulci are appropriate size for the
patient's age. The gray-white matter discrimination is preserved.
There is no acute intracranial hemorrhage. No mass effect or midline
shift. No extra-axial fluid collection.

Vascular: No hyperdense vessel or unexpected calcification.

Skull: Normal. Negative for fracture or focal lesion.

Sinuses/Orbits: Mild mucoperiosteal thickening of paranasal sinuses.
No air-fluid level. Mastoid air cells are clear.

Other: None
IMPRESSION: No acute intracranial pathology.

## 2021-07-19 MED ORDER — METOCLOPRAMIDE HCL 5 MG/ML IJ SOLN
10.0000 mg | Freq: Once | INTRAMUSCULAR | Status: AC
  Administered 2021-07-19: 10 mg via INTRAVENOUS
  Filled 2021-07-19: qty 2

## 2021-07-19 MED ORDER — LACTATED RINGERS IV BOLUS
1000.0000 mL | Freq: Once | INTRAVENOUS | Status: AC
  Administered 2021-07-19: 1000 mL via INTRAVENOUS

## 2021-07-19 MED ORDER — KETOROLAC TROMETHAMINE 15 MG/ML IJ SOLN
15.0000 mg | Freq: Once | INTRAMUSCULAR | Status: AC
  Administered 2021-07-19: 15 mg via INTRAVENOUS
  Filled 2021-07-19: qty 1

## 2021-07-19 MED ORDER — FENTANYL CITRATE PF 50 MCG/ML IJ SOSY
50.0000 ug | PREFILLED_SYRINGE | Freq: Once | INTRAMUSCULAR | Status: AC
  Administered 2021-07-19: 50 ug via INTRAVENOUS
  Filled 2021-07-19: qty 1

## 2021-07-19 MED ORDER — IOHEXOL 350 MG/ML SOLN
75.0000 mL | Freq: Once | INTRAVENOUS | Status: AC | PRN
  Administered 2021-07-19: 75 mL via INTRAVENOUS

## 2021-07-19 MED ORDER — ONDANSETRON HCL 4 MG/2ML IJ SOLN
4.0000 mg | Freq: Once | INTRAMUSCULAR | Status: AC
  Administered 2021-07-19: 4 mg via INTRAVENOUS
  Filled 2021-07-19: qty 2

## 2021-07-19 NOTE — Discharge Instructions (Signed)
You have been seen in the Emergency Department (ED) for a headache. Your evaluation today was overall reassuring. Headaches have many possible causes. Most headaches aren't a sign of a more serious problem, and they will get better on their own.   Follow-up with your doctor in 12-24 hours if you are still having a headache. Otherwise follow up with your doctor in 3-5 days.  For pain take Tylenol 1000 mg or ibuprofen 800 mg every 6 hours  When should you call for help?  Call 911 or return to the ED anytime you think you may need emergency care. For example, call if:  You have signs of a stroke. These may include:  Sudden numbness, paralysis, or weakness in your face, arm, or leg, especially on only one side of your body.  Sudden vision changes.  Sudden trouble speaking.  Sudden confusion or trouble understanding simple statements.  Sudden problems with walking or balance.  A sudden, severe headache that is different from past headaches. You have new or worsening headache Nausea and vomiting associated with your headache Fever, neck stiffness associated with your headache  Call your doctor now or seek immediate medical care if:  You have a new or worse headache.  Your headache gets much worse.  How can you care for yourself at home?  Do not drive if you have taken a prescription pain medicine.  Rest in a quiet, dark room until your headache is gone. Close your eyes and try to relax or go to sleep. Don't watch TV or read.  Put a cold, moist cloth or cold pack on the painful area for 10 to 20 minutes at a time. Put a thin cloth between the cold pack and your skin.  Use a warm, moist towel or a heating pad set on low to relax tight shoulder and neck muscles.  Have someone gently massage your neck and shoulders.  Take pain medicines exactly as directed.  If the doctor gave you a prescription medicine for pain, take it as prescribed.  If you are not taking a prescription pain medicine, ask  your doctor if you can take an over-the-counter medicine. Be careful not to take pain medicine more often than the instructions allow, because you may get worse or more frequent headaches when the medicine wears off.  Do not ignore new symptoms that occur with a headache, such as a fever, weakness or numbness, vision changes, or confusion. These may be signs of a more serious problem.  To prevent headaches  Keep a headache diary so you can figure out what triggers your headaches. Avoiding triggers may help you prevent headaches. Record when each headache began, how long it lasted, and what the pain was like (throbbing, aching, stabbing, or dull). Write down any other symptoms you had with the headache, such as nausea, flashing lights or dark spots, or sensitivity to bright light or loud noise. Note if the headache occurred near your period. List anything that might have triggered the headache, such as certain foods (chocolate, cheese, wine) or odors, smoke, bright light, stress, or lack of sleep.  Find healthy ways to deal with stress. Headaches are most common during or right after stressful times. Take time to relax before and after you do something that has caused a headache in the past.  Try to keep your muscles relaxed by keeping good posture. Check your jaw, face, neck, and shoulder muscles for tension, and try relaxing them. When sitting at a desk, change positions often,  and stretch for 30 seconds each hour.  Get plenty of sleep and exercise.  Eat regularly and well. Long periods without food can trigger a headache.  Treat yourself to a massage. Some people find that regular massages are very helpful in relieving tension.  Limit caffeine by not drinking too much coffee, tea, or soda. But don't quit caffeine suddenly, because that can also give you headaches.  Reduce eyestrain from computers by blinking frequently and looking away from the computer screen every so often. Make sure you have proper  eyewear and that your monitor is set up properly, about an arm's length away.  Seek help if you have depression or anxiety. Your headaches may be linked to these conditions. Treatment can both prevent headaches and help with symptoms of anxiety or depression.

## 2021-07-19 NOTE — ED Triage Notes (Signed)
Brought in via medic for syncopal episode post shower. Patient reports headache and congestion x 2 days. History of CHF

## 2021-07-19 NOTE — ED Notes (Signed)
Patient verbalized discharge understanding  

## 2021-10-02 ENCOUNTER — Encounter: Payer: Self-pay | Admitting: Urgent Care

## 2022-01-03 ENCOUNTER — Emergency Department
Admission: EM | Admit: 2022-01-03 | Discharge: 2022-01-03 | Discharge status: Against Medical Advice | Payer: Medicare Other

## 2022-01-03 ENCOUNTER — Encounter: Payer: Self-pay | Admitting: Urgent Care

## 2022-01-03 NOTE — ED Notes (Signed)
Pt called for triage. No answer.

## 2022-01-03 NOTE — ED Notes (Signed)
No answer when called several times from lobby 

## 2022-02-06 ENCOUNTER — Encounter: Payer: Self-pay | Admitting: Urgent Care

## 2022-02-11 NOTE — H&P (Signed)
Tracey White is a 42 y.o. female here for  Fx D+C , myosure and placement of Mirena IUD  .pt here for follow up for OAB and menorrhagia   Pt didn't start Myrbetriq ( too expensive )  Pathology [facility] :  Comment: Part A-Endometrial Biopsy: SECRETORY ENDOMETRIUM,AND POLYPOID FRAGMENTS SUGGESTIVE OF BENIGN ENDOMETRIAL POLYP. NO HYPERPLASIA OR CARCINOMA          Past Medical History:  has a past medical history of Allergy, Anxiety, Arthritis, Asthma without status asthmaticus, BMI 60.0-69.9, adult (CMS-HCC), CHF (congestive heart failure) (CMS-HCC), Chronic heart failure (CMS-HCC), Chronic pain syndrome, Depression (2009), Heart palpitations, Hypokalemia (01/09/2017), Microcytic anemia (01/09/2017), Obesity, Prediabetes (01/09/2017), Scoliosis, Sleep apnea, and Vitamin D deficiency, unspecified.  Past Surgical History:  has a past surgical history that includes Inguinal hernia repair (05/23/2007); scoliosis surgery (1989, 1991); Repair Incisional Hernia Laparoscopic Incarcerated/Strangulated (06/2010); Tubal ligation; and bariatric surgery (01/06/2019). Family History: family history includes Aneurysm in her mother; Colon cancer in her maternal uncle; Diabetes in her mother; High blood pressure (Hypertension) in her mother; No Known Problems in her father; Obesity in her daughter; Seizures in her mother; Throat cancer in her maternal grandmother. Social History:  reports that she has been smoking cigarettes. She has a 4.4 pack-year smoking history. She has never used smokeless tobacco. She reports that she does not currently use alcohol. She reports that she does not use drugs. OB/GYN History:  OB History       Gravida  2   Para  1   Term      Preterm      AB  1   Living  1        SAB      IAB      Ectopic      Molar      Multiple      Live Births  1             Allergies: has No Known Allergies. Medications:   Current Outpatient Medications:    acetaminophen (TYLENOL  ARTHRITIS ORAL), Take by mouth as needed, Disp: , Rfl:    bumetanide (BUMEX) 1 MG tablet, Take 1 tablet (1 mg total) by mouth 2 (two) times daily, Disp: 180 tablet, Rfl: 3   buPROPion (WELLBUTRIN SR) 150 MG SR tablet, Take 150 mg by mouth 2 (two) times daily with meals, Disp: , Rfl:    busPIRone (BUSPAR) 15 MG tablet, , Disp: , Rfl:    fluticasone propionate (FLONASE) 50 mcg/actuation nasal spray, Place 2 sprays into both nostrils once daily, Disp: 16 g, Rfl: 11   naltrexone (REVIA) 50 mg tablet, Take 50 mg by mouth once daily, Disp: , Rfl:    potassium chloride (K-TAB) 20 mEq TbER ER tablet, Take 1 tablet (20 mEq total) by mouth once daily as needed, Disp: 90 tablet, Rfl: 1   QUEtiapine (SEROQUEL) 25 MG tablet, TAKE (3) TABLETS BY MOUTH NIGHTLY, Disp: , Rfl:    busPIRone (BUSPAR) 7.5 MG tablet, , Disp: , Rfl:    cetirizine (ZYRTEC) 10 MG tablet, Take 1 tablet (10 mg total) by mouth once daily (Patient not taking: Reported on 01/11/2022), Disp: 30 tablet, Rfl: 11   solifenacin (VESICARE) 5 MG tablet, Take 1 tablet (5 mg total) by mouth once daily, Disp: 30 tablet, Rfl: 11   Review of Systems: General:                      No fatigue or weight  loss Eyes:                           No vision changes Ears:                            No hearing difficulty Respiratory:                No cough or shortness of breath Pulmonary:                  No asthma or shortness of breath Cardiovascular:           No chest pain, palpitations, dyspnea on exertion Gastrointestinal:          No abdominal bloating, chronic diarrhea, constipations, masses, pain or hematochezia Genitourinary:             No hematuria, dysuria, abnormal vaginal discharge, pelvic pain, Menometrorrhagia Lymphatic:                   No swollen lymph nodes Musculoskeletal:         No muscle weakness Neurologic:                  No extremity weakness, syncope, seizure disorder Psychiatric:                  No history of depression,  delusions or suicidal/homicidal ideation      Exam:       Vitals:    02/01/22 0914  BP: (!) 142/98  Pulse: 87      Body mass index is 47.59 kg/m.   WDWN / black female in NAD   Lungs: CTA  CV : RRR without murmur   Breast: exam done in sitting and lying position : No dimpling or retraction, no dominant mass, no spontaneous discharge, no axillary adenopathy Neck:  no thyromegaly Abdomen: soft , no mass, normal active bowel sounds,  non-tender, no rebound tenderness Pelvic: tanner stage 5 ,  External genitalia: vulva /labia no lesions Urethra: no prolapse Vagina: normal physiologic d/c Cervix:  high , no lesions, no cervical motion tenderness   Uterus: normal size shape and contour, non-tender Adnexa: no mass,  non-tender   Rectovaginal:    Impression:    The primary encounter diagnosis was Menorrhagia with irregular cycle. A diagnosis of OAB (overactive bladder) was also pertinent to this visit.       Plan:    Spoke to her about Fx D+C , possible myosure  and placement of Mirena IUD in OR .   risks discussed . Please reference [facility] notes            Crissa Sowder Samuel [place], MD

## 2022-02-13 ENCOUNTER — Encounter
Admission: RE | Admit: 2022-02-13 | Discharge: 2022-02-13 | Disposition: A | Discharge status: Home / Self Care | Payer: 59 | Source: Ambulatory Visit | Attending: Obstetrics and Gynecology | Admitting: Obstetrics and Gynecology

## 2022-02-13 VITALS — Ht 61.5 in | Wt 257.9 lb

## 2022-02-13 DIAGNOSIS — I119 Hypertensive heart disease without heart failure: Secondary | ICD-10-CM

## 2022-02-13 DIAGNOSIS — Z01818 Encounter for other preprocedural examination: Secondary | ICD-10-CM

## 2022-02-13 DIAGNOSIS — Z01810 Encounter for preprocedural cardiovascular examination: Secondary | ICD-10-CM

## 2022-02-13 DIAGNOSIS — I5032 Chronic diastolic (congestive) heart failure: Secondary | ICD-10-CM

## 2022-02-13 HISTORY — DX: Hypokalemia: E87.6

## 2022-02-13 HISTORY — DX: Depression, unspecified: F32.A

## 2022-02-13 HISTORY — DX: Iron deficiency anemia, unspecified: D50.9

## 2022-02-13 HISTORY — DX: Unspecified asthma, uncomplicated: J45.909

## 2022-02-13 HISTORY — DX: Prediabetes: R73.03

## 2022-02-13 HISTORY — DX: Failed or difficult intubation, initial encounter: T88.4XXA

## 2022-02-13 HISTORY — DX: Tachycardia, unspecified: R00.0

## 2022-02-13 HISTORY — DX: Polyp of corpus uteri: N84.0

## 2022-02-13 HISTORY — DX: Gastritis, unspecified, without bleeding: K29.70

## 2022-02-13 HISTORY — DX: Other specified abnormal immunological findings in serum: R76.8

## 2022-02-13 HISTORY — DX: Dyspnea, unspecified: R06.00

## 2022-02-13 HISTORY — DX: Gastro-esophageal reflux disease without esophagitis: K21.9

## 2022-02-13 NOTE — Patient Instructions (Signed)
Your procedure is scheduled on:02-21-22 Thursday Report to the Registration Desk on the 1st floor of the [facility].Then proceed to the 2nd floor Surgery Desk To find out your arrival time, please call [phone] between 1PM - 3PM on:02-20-22 Wednesday If your arrival time is 6:00 am, do not arrive prior to that time as the [facility] entrance doors do not open until 6:00 am.  REMEMBER: Instructions that are not followed completely may result in serious medical risk, up to and including death; or upon the discretion of your surgeon and anesthesiologist your surgery may need to be rescheduled.  Do not eat food after midnight the night before surgery.  No gum chewing, lozengers or hard candies.  You may however, drink CLEAR liquids up to 2 hours before you are scheduled to arrive for your surgery. Do not drink anything within 2 hours of your scheduled arrival time.  Clear liquids include: - water  - apple juice without pulp - gatorade (not RED colors) - black coffee or tea (Do NOT add milk or creamers to the coffee or tea) Do NOT drink anything that is not on this list.  In addition, your doctor has ordered for you to drink the provided  Ensure Pre-Surgery Clear Carbohydrate Drink  Drinking this carbohydrate drink up to two hours before surgery helps to reduce insulin resistance and improve patient outcomes. Please complete drinking 2 hours prior to scheduled arrival time.  TAKE THESE MEDICATIONS THE MORNING OF SURGERY WITH A SIP OF WATER: -buPROPion (WELLBUTRIN SR)  -solifenacin (VESICARE)  -omeprazole (PRILOSEC OTC) -take one the night before and one on the morning of surgery - helps to prevent nausea after surgery.)  One week prior to surgery: Stop Anti-inflammatories (NSAIDS) such as Advil, Aleve, Ibuprofen, Motrin, Naproxen, Naprosyn and Aspirin based products such as Excedrin, Goodys Powder, BC Powder.You may however, continue to take Tylenol if needed for pain up until the  day of surgery.  Stop ANY OVER THE COUNTER supplements/vitamins NOW (02-13-22) until after surgery.  No Alcohol for 24 hours before or after surgery.  No Smoking including e-cigarettes for 24 hours prior to surgery.  No chewable tobacco products for at least 6 hours prior to surgery.  No nicotine patches on the day of surgery.  Do not use any "recreational" drugs for at least a week prior to your surgery.  Please be advised that the combination of cocaine and anesthesia may have negative outcomes, up to and including death. If you test positive for cocaine, your surgery will be cancelled.  On the morning of surgery brush your teeth with toothpaste and water, you may rinse your mouth with mouthwash if you wish. Do not swallow any toothpaste or mouthwash.  Do not wear jewelry, make-up, hairpins, clips or nail polish.  Do not wear lotions, powders, or perfumes.   Do not shave body from the neck down 48 hours prior to surgery just in case you cut yourself which could leave a site for infection.  Also, freshly shaved skin may become irritated if using the CHG soap.  Contact lenses, hearing aids and dentures may not be worn into surgery.  Do not bring valuables to the hospital. [facility] is not responsible for any missing/lost belongings or valuables.   Notify your doctor if there is any change in your medical condition (cold, fever, infection).  Wear comfortable clothing (specific to your surgery type) to the hospital.  After surgery, you can help prevent lung complications by doing breathing exercises.  Take deep breaths and cough every 1-2 hours. Your doctor may order a device called an Incentive Spirometer to help you take deep breaths. When coughing or sneezing, hold a pillow firmly against your incision with both hands. This is called "splinting." Doing this helps protect your incision. It also decreases belly discomfort.  If you are being admitted to the hospital overnight,  leave your suitcase in the car. After surgery it may be brought to your room.  If you are being discharged the day of surgery, you will not be allowed to drive home. You will need a responsible adult (18 years or older) to drive you home and stay with you that night.   If you are taking public transportation, you will need to have a responsible adult (18 years or older) with you. Please confirm with your physician that it is acceptable to use public transportation.   Please call the [facility] at [phone] if you have any questions about these instructions.  Surgery Visitation Policy:  Patients undergoing a surgery or procedure may have two family members or support persons with them as long as the person is not COVID-19 positive or experiencing its symptoms.   Due to an increase in RSV and influenza rates and associated hospitalizations, children ages 70 and under will not be able to visit patients in [facility]. Masks continue to be strongly recommended.   How to Use an Incentive Spirometer An incentive spirometer is a tool that measures how well you are filling your lungs with each breath. Learning to take long, deep breaths using this tool can help you keep your lungs clear and active. This may help to reverse or lessen your chance of developing breathing (pulmonary) problems, especially infection. You may be asked to use a spirometer: After a surgery. If you have a lung problem or a history of smoking. After a long period of time when you have been unable to move or be active. If the spirometer includes an indicator to show the highest number that you have reached, your health care provider or respiratory therapist will help you set a goal. Keep a log of your progress as told by your health care provider. What are the risks? Breathing too quickly may cause dizziness or cause you to pass out. Take your time so you do not get dizzy or light-headed. If  you are in pain, you may need to take pain medicine before doing incentive spirometry. It is harder to take a deep breath if you are having pain. How to use your incentive spirometer  Sit up on the edge of your bed or on a chair. Hold the incentive spirometer so that it is in an upright position. Before you use the spirometer, breathe out normally. Place the mouthpiece in your mouth. Make sure your lips are closed tightly around it. Breathe in slowly and as deeply as you can through your mouth, causing the piston or the ball to rise toward the top of the chamber. Hold your breath for 3-5 seconds, or for as long as possible. If the spirometer includes a coach indicator, use this to guide you in breathing. Slow down your breathing if the indicator goes above the marked areas. Remove the mouthpiece from your mouth and breathe out normally. The piston or ball will return to the bottom of the chamber. Rest for a few seconds, then repeat the steps 10 or more times. Take your time and take a few normal breaths between  deep breaths so that you do not get dizzy or light-headed. Do this every 1-2 hours when you are awake. If the spirometer includes a goal marker to show the highest number you have reached (best effort), use this as a goal to work toward during each repetition. After each set of 10 deep breaths, cough a few times. This will help to make sure that your lungs are clear. If you have an incision on your chest or abdomen from surgery, place a pillow or a rolled-up towel firmly against the incision when you cough. This can help to reduce pain while taking deep breaths and coughing. General tips When you are able to get out of bed: Walk around often. Continue to take deep breaths and cough in order to clear your lungs. Keep using the incentive spirometer until your health care provider says it is okay to stop using it. If you have been in the hospital, you may be told to keep using the spirometer  at home. Contact a health care provider if: You are having difficulty using the spirometer. You have trouble using the spirometer as often as instructed. Your pain medicine is not giving enough relief for you to use the spirometer as told. You have a fever. Get help right away if: You develop shortness of breath. You develop a cough with bloody mucus from the lungs. You have fluid or blood coming from an incision site after you cough. Summary An incentive spirometer is a tool that can help you learn to take long, deep breaths to keep your lungs clear and active. You may be asked to use a spirometer after a surgery, if you have a lung problem or a history of smoking, or if you have been inactive for a long period of time. Use your incentive spirometer as instructed every 1-2 hours while you are awake. If you have an incision on your chest or abdomen, place a pillow or a rolled-up towel firmly against your incision when you cough. This will help to reduce pain. Get help right away if you have shortness of breath, you cough up bloody mucus, or blood comes from your incision when you cough. This information is not intended to replace advice given to you by your health care provider. Make sure you discuss any questions you have with your health care provider. Document Revised: 04/05/2019 Document Reviewed: 04/05/2019 Elsevier Patient Education  2023 [employer].

## 2022-02-14 ENCOUNTER — Encounter (HOSPITAL_COMMUNITY): Payer: Self-pay | Admitting: Urgent Care

## 2022-02-14 ENCOUNTER — Encounter
Admission: RE | Admit: 2022-02-14 | Discharge: 2022-02-14 | Disposition: A | Discharge status: Home / Self Care | Payer: 59 | Source: Ambulatory Visit | Attending: Obstetrics and Gynecology | Admitting: Obstetrics and Gynecology

## 2022-02-14 ENCOUNTER — Encounter: Payer: Self-pay | Admitting: Obstetrics and Gynecology

## 2022-02-14 DIAGNOSIS — I5032 Chronic diastolic (congestive) heart failure: Secondary | ICD-10-CM | POA: Diagnosis not present

## 2022-02-14 DIAGNOSIS — I119 Hypertensive heart disease without heart failure: Secondary | ICD-10-CM

## 2022-02-14 DIAGNOSIS — Z01818 Encounter for other preprocedural examination: Secondary | ICD-10-CM | POA: Diagnosis present

## 2022-02-14 DIAGNOSIS — Z01810 Encounter for preprocedural cardiovascular examination: Secondary | ICD-10-CM

## 2022-02-14 LAB — BASIC METABOLIC PANEL
Anion gap: 5 (ref 5–15)
BUN: 16 mg/dL (ref 6–20)
CO2: 27 mmol/L (ref 22–32)
Calcium: 8.7 mg/dL — ABNORMAL LOW (ref 8.9–10.3)
Chloride: 105 mmol/L (ref 98–111)
Creatinine, Ser: 0.73 mg/dL (ref 0.44–1.00)
GFR, Estimated: >60 mL/min (ref >60)
Glucose, Bld: 75 mg/dL (ref 70–99)
Potassium: 3.4 mmol/L — ABNORMAL LOW (ref 3.5–5.1)
Sodium: 137 mmol/L (ref 135–145)

## 2022-02-14 LAB — CBC
HCT: 33.5 % — ABNORMAL LOW (ref 36.0–46.0)
Hemoglobin: 10 g/dL — ABNORMAL LOW (ref 12.0–15.0)
MCH: 23.6 pg — ABNORMAL LOW (ref 26.0–34.0)
MCHC: 29.9 g/dL — ABNORMAL LOW (ref 30.0–36.0)
MCV: 79 fL — ABNORMAL LOW (ref 80.0–100.0)
Platelets: 325 10*3/uL (ref 150–400)
RBC: 4.24 MIL/uL (ref 3.87–5.11)
RDW: 16.6 % — ABNORMAL HIGH (ref 11.5–15.5)
WBC: 6.5 10*3/uL (ref 4.0–10.5)
nRBC: 0 % (ref 0.0–0.2)

## 2022-02-14 LAB — TYPE AND SCREEN
ABO/RH(D): A POS
Antibody Screen: NEGATIVE

## 2022-02-14 NOTE — Progress Notes (Incomplete)
Perioperative Services  Pre-Admission/Anesthesia Testing Clinical Review  Date: 02/14/22  Patient Demographics:  Name: Tracey White DOB:   12-May-1980 MRN:   [id number]  Planned Surgical Procedure(s):    Case: [id number] Date/Time: 02/21/22 0851   Procedures:      FRACTIONAL DILATATION & CURETTAGE/HYSTEROSCOPY WITH MYOSURE     INTRAUTERINE DEVICE (IUD) INSERTION WITH MIRENA   Anesthesia type: Choice   Pre-op diagnosis: menorrhagia, endometrial polyp   Location: [facility] OR ROOM 08 / [facility]   Surgeons: Schermerhorn, Ihor Austin, MD   NOTE: Available PAT nursing documentation and vital signs have been reviewed. Clinical nursing staff has updated patient's PMH/PSHx, current medication list, and drug allergies/intolerances to ensure comprehensive history available to assist in medical decision making as it pertains to the aforementioned surgical procedure and anticipated anesthetic course. Extensive review of available clinical information performed. [facility] PMH and PSHx updated with any diagnoses/procedures that  may have been inadvertently omitted during her intake with the pre-admission testing department's nursing staff.  Clinical Discussion:  Tracey White is a 42 y.o. female who is submitted for pre-surgical anesthesia review and clearance prior to her undergoing the above procedure. Patient is a Current Smoker (10 pack years). Pertinent PMH includes: CHF, sinus tachycardia, prediabetes, DOE, OSAH (no longer requires nocturnal PAP therapy), asthma, GERD (on daily PPI), gastritis, microcytic anemia, menorrhagia, endometrial polyp, lymphedema, chronic lower back pain, chronic pain syndrome, scoliosis, anxiety, depression.  Patient is followed by cardiology Jerolyn Center, MD). She was last seen in the cardiology clinic on ***; notes reviewed. ***  ***Patient with an atrial fibrillation diagnosis; CHA2DS2-VASc Score = *** (***). Her rate and rhythm are currently being maintained  on oral ***. She is chronically anticoagulated using ***; reported to be compliant with therapy with no evidence or reports of GI bleeding.  Blood pressure ***controlled at *** on currently prescribed *** therapies. She is on a *** for her HLD diagnosis and further ASCVD prevention. T2DM *** controlled on currently prescribed regimen; last HgbA1c was ***% when checked on ***..  ***Blood pressure***controlled at *** mmHg on currently prescribed***therapy.  Patient is on***for herHLD diagnosis and ASCVD prevention.  ***Patient is not diabetic/T2DM***controlled on currently prescribed regimen; last HgbA1c was***when checked on***. Shedoes not have an OSAH diagnosis. ***Functional capacity, as defined by DASI, is documented as being >/= 4 METS. ***Functional capacity limited by***, however without being said, patient still felt to be able to achieve at least 4 METS of physical activity without experiencing any degree of angina/anginal equivalent symptoms. No changes were made to hermedication regimen during hervisit with cardiology.  Patient follow-up with outpatient cardiology in***months or sooner if needed.  Tracey White is scheduled for an elective FRACTIONAL DILATATION & CURETTAGE/HYSTEROSCOPY WITH MYOSURE INTRAUTERINE DEVICE (IUD) INSERTION WITH MIRENA on 02/21/2022 with Dr. Jennell Corner, MD.  Given patient's past medical history significant for cardiovascular diagnoses, presurgical cardiac clearance was sought by the PAT team. ***.  In review of her medication reconciliation, the patient is not noted to be taking any type of anticoagulation or antiplatelet therapies that would need to be held during her perioperative course.  Patient denies previous perioperative complications with anesthesia in the past.  Patient reports that she has previously been advised that she was a (+) difficult intubation. Patient presented to [facility] for tubal ligation, however due to morbid obesity, patient had to be  transferred to [facility] for procedure. She has since undergone sleeve gastrectomy, resulting in a significant weight loss. Patient reports  that she has been intubated without difficulties since her weight loss. In review of the available records, it is noted that patient underwent a general anesthetic course at [facility] ([facility]) in 12/2018 without documented complications.      02/13/2022   10:00 AM 07/19/2021    4:00 AM 07/19/2021    3:30 AM  Vitals with BMI  Height 5' 1.5"    Weight 257 lbs 15 oz    BMI 60.63    Systolic  016 010  Diastolic  70 71  Pulse  74 70    Providers/Specialists:   NOTE: Primary physician provider listed below. Patient may have been seen by APP or partner within same practice.   PROVIDER ROLE / SPECIALTY LAST Cannon Kettle, MD OB/GYN (Surgeon) 02/01/2022  [facility] Primary Care Provider 12/12/2021  Leanor Rubenstein, MD Cardiology 09/28/2021  Girtha Hake, MD Physiatry 01/02/2022   Allergies:  Patient has no known allergies.  Current Home Medications:   No current facility-administered medications for this encounter.    acetaminophen (TYLENOL) 650 MG CR tablet   Aspirin-Caffeine 845-65 MG PACK   bumetanide (BUMEX) 1 MG tablet   buPROPion (WELLBUTRIN SR) 150 MG 12 hr tablet   busPIRone (BUSPAR) 7.5 MG tablet   cetirizine (ZYRTEC) 10 MG tablet   fluticasone (FLONASE) 50 MCG/ACT nasal spray   naltrexone (DEPADE) 50 MG tablet   omeprazole (PRILOSEC OTC) 20 MG tablet   potassium chloride SA (KLOR-CON M) 20 MEQ tablet   QUEtiapine (SEROQUEL) 25 MG tablet   solifenacin (VESICARE) 5 MG tablet    technetium tetrofosmin (TC-MYOVIEW) injection 93.23 millicurie   History:   Past Medical History:  Diagnosis Date   Anxiety    Asthma    B12 deficiency    CHF (congestive heart failure) (HCC)    Chronic low back pain with sciatica    Chronic pain syndrome    Depression    Difficult  intubation    x 1 with Tubal ligation-came here to [facility] for tubal and anesthesia could not get her intubated-Was sent to [facility] for surgery-Has had bariatric surgery since with no issue   DOE (dyspnea on exertion)    Endometrial polyp    Gastritis    GERD (gastroesophageal reflux disease)    Hypokalemia    Lumbosacral facet joint syndrome    Lymphedema    Major depressive disorder    Microcytic anemia    Morbid obesity (HCC)    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)    OSA (obstructive sleep apnea)    a.) no longer requires nocturnal PAP therapy following significant weight loss associated with sleeve gastrectomy   Peripheral edema    Positive ANA (antinuclear antibody)    Pre-diabetes    Scoliosis    Sinus tachycardia    Status post laparoscopic sleeve gastrectomy    Vasovagal syncope    Vitamin D deficiency    Past Surgical History:  Procedure Laterality Date   HERNIA REPAIR     LAPAROSCOPIC GASTRIC SLEEVE RESECTION N/A 2021   scoliosis repair     x2   TUBAL LIGATION     Family History  Problem Relation Age of Onset   Diabetes Mother    Hypertension Mother    Cancer Maternal Grandmother    Social History   Tobacco Use   Smoking status: Every Day    Packs/day: 0.50    Years: 20.00  Total pack years: 10.00    Types: Cigarettes   Smokeless tobacco: Never  Vaping Use   Vaping Use: Never used  Substance Use Topics   Alcohol use: Yes    Alcohol/week: 1.0 - 2.0 standard drink of alcohol    Types: 1 - 2 Standard drinks or equivalent per week    Comment: occasionally   Drug use: No    Pertinent Clinical Results:  LABS: Labs reviewed: Acceptable for surgery.  Hospital Outpatient Visit on 02/14/2022  Component Date Value Ref Range Status   WBC 02/14/2022 6.5  4.0 - 10.5 K/uL Final   RBC 02/14/2022 4.24  3.87 - 5.11 MIL/uL Final   Hemoglobin 02/14/2022 10.0 (L)  12.0 - 15.0 g/dL Final   HCT 02/14/2022 33.5 (L)  36.0 -  46.0 % Final   MCV 02/14/2022 79.0 (L)  80.0 - 100.0 fL Final   MCH 02/14/2022 23.6 (L)  26.0 - 34.0 pg Final   MCHC 02/14/2022 29.9 (L)  30.0 - 36.0 g/dL Final   RDW 02/14/2022 16.6 (H)  11.5 - 15.5 % Final   Platelets 02/14/2022 325  150 - 400 K/uL Final   nRBC 02/14/2022 0.0  0.0 - 0.2 % Final   Performed at [facility], [address] [place], [place] [address]   ABO/RH(D) 02/14/2022 A POS   Final   Antibody Screen 02/14/2022 NEG   Final   Sample Expiration 02/14/2022 02/28/2022,2359   Final   Extend sample reason 02/14/2022    Final                   Value:NO TRANSFUSIONS OR PREGNANCY IN THE PAST 3 MONTHS Performed at [facility], [place], [place] [address]    Sodium 02/14/2022 137  135 - 145 mmol/L Final   Potassium 02/14/2022 3.4 (L)  3.5 - 5.1 mmol/L Final   Chloride 02/14/2022 105  98 - 111 mmol/L Final   CO2 02/14/2022 27  22 - 32 mmol/L Final   Glucose, Bld 02/14/2022 75  70 - 99 mg/dL Final   Glucose reference range applies only to samples taken after fasting for at least 8 hours.   BUN 02/14/2022 16  6 - 20 mg/dL Final   Creatinine, Ser 02/14/2022 0.73  0.44 - 1.00 mg/dL Final   Calcium 02/14/2022 8.7 (L)  8.9 - 10.3 mg/dL Final   GFR, Estimated 02/14/2022 >60  >60 mL/min Final   Comment: (NOTE) Calculated using the CKD-EPI Creatinine Equation (2021)    Anion gap 02/14/2022 5  5 - 15 Final   Performed at [facility], [address] [place], [place] [address]    ECG: Date: 02/14/2022 Time ECG obtained: 1025 AM Rate: 72 bpm Rhythm: normal sinus Axis (leads I and aVF): Normal Intervals: PR 152 ms. QRS 88 ms. QTc 427 ms. ST segment and T wave changes: No evidence of acute ST segment elevation or depression Comparison: Similar to previous tracing obtained on 07/19/2021   IMAGING / PROCEDURES: PELVIC ULTRASOUND PROTOCOL TRANSABDOMINAL AND TRANSVAGINAL COMPLETE performed on 10/30/2021 Single intramural uterine mass,  presumably a fibroid.  Right ovary not visualized.   TRANSTHORACIC ECHOCARDIOGRAM performed on 08/14/2018 Normal left ventricular systolic function with an EF of greater than 55% No regional wall motion abnormalities right ventricular size and function normal Trivial tricuspid valve regurgitation Normal gradients; no valvular stenosis Estimated RVSP = 37 mmHg  MYOCARDIAL PERFUSION IMAGING STUDY (LEXISCAN) performed on 11/09/2015 Moderately reduced left ventricular systolic function with  an LVEF of 44%  Normal myocardial thickening and wall motion Left ventricular cavity size normal SPECT images demonstrate homogenous tracer distribution throughout the myocardium No evidence of stress-induced myocardial ischemia or arrhythmia Normal low risk study  Impression and Plan:  Tracey White has been referred for pre-anesthesia review and clearance prior to her undergoing the planned anesthetic and procedural courses. Available labs, pertinent testing, and imaging results were personally reviewed by me. This patient has been appropriately cleared by cardiology with an overall *** risk of significant perioperative cardiovascular complications.  Based on clinical review performed today (02/14/22), barring any significant acute changes in the patient's overall condition, it is anticipated that she will be able to proceed with the planned surgical intervention. Any acute changes in clinical condition may necessitate her procedure being postponed and/or cancelled. Patient will meet with anesthesia team (MD and/or CRNA) on the day of her procedure for preoperative evaluation/assessment. Questions regarding anesthetic course will be fielded at that time.   Pre-surgical instructions were reviewed with the patient during her PAT appointment and questions were fielded by PAT clinical staff. Patient was advised that if any questions or concerns arise prior to her procedure then she should return a call to PAT  and/or her surgeon's office to discuss.  Tracey Loh, MSN, APRN, FNP-C, CEN [facility]  Peri-operative Services Nurse Practitioner Phone: [phone] Fax: [phone] 02/14/22 2:27 PM  NOTE: This note has been prepared using Dragon dictation software. Despite my best ability to proofread, there is always the potential that unintentional transcriptional errors may still occur from this process.

## 2022-02-15 ENCOUNTER — Encounter: Payer: Self-pay | Admitting: Urgent Care

## 2022-02-19 ENCOUNTER — Other Ambulatory Visit: Payer: Self-pay | Admitting: Obstetrics and Gynecology

## 2022-02-21 ENCOUNTER — Ambulatory Visit: Admission: RE | Admit: 2022-02-21 | Payer: 59 | Source: Home / Self Care | Admitting: Obstetrics and Gynecology

## 2022-02-21 ENCOUNTER — Encounter: Admission: RE | Payer: Self-pay | Source: Home / Self Care

## 2022-02-21 DIAGNOSIS — Z01818 Encounter for other preprocedural examination: Secondary | ICD-10-CM

## 2022-02-21 HISTORY — DX: Other chronic pain: G89.29

## 2022-02-21 HISTORY — DX: Chronic pain syndrome: G89.4

## 2022-02-21 HISTORY — DX: Major depressive disorder, single episode, unspecified: F32.9

## 2022-02-21 HISTORY — DX: Lymphedema, not elsewhere classified: I89.0

## 2022-02-21 HISTORY — DX: 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: M45.A8

## 2022-02-21 HISTORY — DX: Deficiency of other specified B group vitamins: E53.8

## 2022-02-21 HISTORY — DX: Bariatric surgery status: Z98.84

## 2022-02-21 HISTORY — DX: Spondylosis without myelopathy or radiculopathy, lumbosacral region: M47.817

## 2022-02-21 HISTORY — DX: Vitamin D deficiency, unspecified: E55.9

## 2022-02-21 HISTORY — DX: Other forms of dyspnea: R06.09

## 2022-02-21 HISTORY — DX: Syncope and collapse: R55

## 2022-02-21 SURGERY — DILATATION & CURETTAGE/HYSTEROSCOPY WITH MYOSURE
Anesthesia: Choice

## 2022-03-02 ENCOUNTER — Encounter: Payer: Self-pay | Admitting: Urgent Care

## 2022-03-07 ENCOUNTER — Encounter: Payer: Self-pay | Admitting: Student in an Organized Health Care Education/Training Program

## 2022-03-07 ENCOUNTER — Ambulatory Visit
Payer: 59 | Attending: Student in an Organized Health Care Education/Training Program | Admitting: Student in an Organized Health Care Education/Training Program

## 2022-03-07 ENCOUNTER — Ambulatory Visit
Admission: RE | Admit: 2022-03-07 | Discharge: 2022-03-07 | Disposition: A | Discharge status: Home / Self Care | Payer: 59 | Source: Ambulatory Visit | Attending: Student in an Organized Health Care Education/Training Program | Admitting: Student in an Organized Health Care Education/Training Program

## 2022-03-07 VITALS — BP 114/70 | HR 78 | Temp 97.4°F | Ht 61.0 in | Wt 249.0 lb

## 2022-03-07 DIAGNOSIS — M5416 Radiculopathy, lumbar region: Secondary | ICD-10-CM | POA: Diagnosis not present

## 2022-03-07 DIAGNOSIS — G8929 Other chronic pain: Secondary | ICD-10-CM | POA: Diagnosis present

## 2022-03-07 DIAGNOSIS — M41115 Juvenile idiopathic scoliosis, thoracolumbar region: Secondary | ICD-10-CM

## 2022-03-07 DIAGNOSIS — M961 Postlaminectomy syndrome, not elsewhere classified: Secondary | ICD-10-CM | POA: Diagnosis present

## 2022-03-07 NOTE — Progress Notes (Signed)
Safety precautions to be maintained throughout the outpatient stay will include: orient to surroundings, keep bed in low position, maintain call bell within reach at all times, provide assistance with transfer out of bed and ambulation.  

## 2022-03-07 NOTE — Progress Notes (Signed)
Patient: Tracey White  Service Category: E/M  Provider: Gillis Santa, MD  DOB: [birth year]  DOS: 03/07/2022  Referring Provider: Doyle Askew, MD  MRN: [id number]  Setting: Ambulatory outpatient  PCP: Hilton Sinclair, PA-C  Type: New Patient  Specialty: Interventional Pain Management    Location: Office  Delivery: Face-to-face     Primary Reason(s) for Visit: Encounter for initial evaluation of one or more chronic problems (new to examiner) potentially causing chronic pain, and posing a threat to normal musculoskeletal function. (Level of risk: High) CC: Back Pain (right)  HPI  Tracey White is a 42 y.o. year old, female patient, who comes for the first time to our practice referred by Girtha Hake I, MD for our initial evaluation of her chronic pain. She has Chest pain at rest; Chronic diastolic heart failure ([place]); Sinus tachycardia; Obstructive sleep apnea; Tobacco use; Chest pain; Hypokalemia; Depression; Chronic low back pain (1ry area of Pain) (Bilateral) (R>L) w/o sciatica; Skin lesion of left leg; Lymphedema; Chronic knee pain (Bilateral) (R>L); Chronic thoracic back pain (Fourth Area of Pain) (Bilateral) (R>L); Chronic pain syndrome; Disorder of skeletal system; Other long term (current) drug therapy; Scoliosis; Asthma without status asthmaticus; Anemia; Acute diastolic heart failure ([place]); Neurogenic pain; Failed back surgical syndrome; Pharmacologic therapy; Problems influencing health status; Chronic lower extremity pain (Bilateral) (R>L); Elevated C-reactive protein (CRP); Elevated sed rate; Vitamin D deficiency; Class 3 severe obesity due to excess calories with serious comorbidity and body mass index (BMI) of 50.0 to 59.9 in adult [facility]); Positive ANA (antinuclear antibody); Positive antinuclear antibody; Iron deficiency anemia; B12 deficiency; Goals of care, counseling/discussion; AIDS ([place]); Long term current use of opiate analgesic; Hypertensive heart disease; Chronic diastolic  congestive heart failure ([place]); Prediabetes; S/P laparoscopic sleeve gastrectomy; Elevated hemoglobin A1c; Lumbar facet syndrome (Bilateral) (R>L); Chronic sacroiliac joint pain (Right); Chronic lower extremity pain (2ry area of Pain) (Right); Chronic knee pain (3ry area of Pain) (Right); Osteoarthritis of knees (Bilateral); Chronic hip pain (4th area of Pain) (Right); Bilateral sacroiliitis ([place]); 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 Today she comes in for evaluation of her Back Pain (right)  Pain Assessment: Location: Right Back Radiating: pain radiaities down her right leg to her knee Onset: More than a month ago Duration: Chronic pain Quality: Aching, Burning, Constant, Throbbing Severity: 6 /10 (subjective, self-reported pain score)  Effect on ADL: limits my daily activities Timing: Constant Modifying factors: Meds, repositioning, heating pad BP: 114/70  HR: 78  Onset and Duration: Sudden and Present longer than 3 months Cause of pain:  Scoliosis Severity: NAS-11 at its worse: 10/10, NAS-11 at its best: 4/10, NAS-11 now: 6/10, and NAS-11 on the average: 6/10 Timing: Not influenced by the time of the day, During activity or exercise, and After activity or exercise Aggravating Factors: Bending, Climbing, Intercourse (sex), Kneeling, Lifiting, Motion, Nerve blocks, Prolonged sitting, Prolonged standing, Squatting, Stooping , Twisting, Walking, Walking uphill, and Walking downhill Alleviating Factors: Lying down, Medications, and Repositioning Associated Problems: Depression, Numbness, Sadness, Spasms, Tingling, Weakness, Pain that wakes patient up, and Pain that does not allow patient to sleep Quality of Pain: Aching, Burning, Constant, Deep, Getting longer, Nagging,  Pressure-like, Pulsating, Sharp, Shooting, Tender, and Throbbing Previous Examinations or Tests: CT scan, MRI scan, and Chiropractic evaluation Previous Treatments: Epidural steroid injections, Narcotic medications, Physical Therapy, Steroid treatments by mouth, Strengthening exercises, Stretching exercises,  and TENS  Tracey White is being evaluated for possible interventional pain management therapies for the treatment of her chronic pain.   Tracey White is a pleasant 42 year old female who presents with a chief complaint of low back pain with radiation into her right leg as well as associated weakness of her right leg.  She is being referred here by Dr. Mariah MillingMorales for consideration of a spinal cord stimulator trial.  Of note she has tried various conservative and interventional therapies including medication trials with acetaminophen, NSAIDs, transforaminal epidural steroid injections done with Dr. Mariah MillingMorales, SI joint injections with limited response.  She has a history of obesity and is currently a smoker.  She also has a history of anxiety and depression.  Of note, she did have scoliosis surgery as a child in 1993 and has Harrington rods in place.  Meds   Current Outpatient Medications:    acetaminophen (TYLENOL) 650 MG CR tablet, Take 650 mg by mouth every 8 (eight) hours as needed for pain., Disp: , Rfl:    Aspirin-Caffeine 845-65 MG PACK, Take 1 packet by mouth as needed., Disp: , Rfl:    bumetanide (BUMEX) 1 MG tablet, Take 1 mg by mouth 2 (two) times daily., Disp: , Rfl:    buPROPion (WELLBUTRIN SR) 150 MG 12 hr tablet, Take 150 mg by mouth 2 (two) times daily., Disp: , Rfl:    busPIRone (BUSPAR) 7.5 MG tablet, Take 7.5 mg by mouth as needed., Disp: , Rfl:    cetirizine (ZYRTEC) 10 MG tablet, Take 10 mg by mouth as needed for allergies., Disp: , Rfl:    fluticasone (FLONASE) 50 MCG/ACT nasal spray, Place 2 sprays into both nostrils as needed for allergies or rhinitis., Disp: , Rfl:    naltrexone  (DEPADE) 50 MG tablet, Take 50 mg by mouth daily., Disp: , Rfl:    omeprazole (PRILOSEC OTC) 20 MG tablet, Take 20 mg by mouth as needed., Disp: , Rfl:    potassium chloride SA (KLOR-CON M) 20 MEQ tablet, Take 20 mEq by mouth daily., Disp: , Rfl:    solifenacin (VESICARE) 5 MG tablet, Take 5 mg by mouth every morning., Disp: , Rfl:    QUEtiapine (SEROQUEL) 25 MG tablet, Take 25 mg by mouth as needed. (Patient not taking: Reported on 03/07/2022), Disp: , Rfl:  No current facility-administered medications for this visit.  Facility-Administered Medications Ordered in Other Visits:    technetium tetrofosmin (TC-MYOVIEW) injection 30.88 millicurie, 30.88 millicurie, Intravenous, Once PRN, [place], David A, MD  Imaging Review   Narrative CLINICAL DATA:  low back pain  EXAM: CERVICAL SPINE - 2-3 VIEW  COMPARISON:  None available  FINDINGS: Mild reversal of the normal cervical lordosis. No fracture, dislocation, or prevertebral soft tissue swelling. The proximal most extent of thoracolumbar fusion hardware is noted at T5, not visualized distally. Missing teeth and dental restorations.  IMPRESSION: No acute findings   Electronically Signed By: Corlis Leak  Hassell M.D. On: 05/28/2021 17:08  MR LUMBAR SPINE WO CONTRAST  Narrative CLINICAL DATA:  Central and right low back pain. Right hip, buttock, leg and foot pain. Lumbar radiculitis.  EXAM: MRI LUMBAR SPINE WITHOUT CONTRAST  TECHNIQUE: Multiplanar, multisequence MR imaging of the lumbar spine was performed. No intravenous contrast was administered.  COMPARISON:  01/18/2020  FINDINGS: Segmentation:  The 5 lumbar type vertebral bodies assumed.  Alignment: Chronic scoliotic curvature of the spine status post fusion with of the thoracic region to the posterior elements of L3 and L4.  Vertebrae: No  acute bone finding of significance. Detail is limited by artifact from fusion hardware.  Conus medullaris and cauda equina: Conus extends  to the lower L1 level. Conus and cauda equina appear normal.  Paraspinal and other soft tissues: Negative  Disc levels:  L3-4: Question shallow left posterolateral to foraminal disc herniation that could possibly affect the left L3 nerve. Limited detail because of artifact.  L4-5: Mild disc bulge. Mild facet osteoarthritis. No compressive stenosis.  L5-S1: No disc abnormality.  Mild facet osteoarthritis.  IMPRESSION: Fusion from the thoracic region to the posterior elements of L3 or L4. Limited detail because of extensive artifact from fusion hardware.  Question shallow left posterolateral to foraminal disc herniation at L3-4 that could possibly affect the left L3 nerve.  Facet osteoarthritis at L4-5 and L5-S1 that could contribute to low back pain.   Electronically Signed By: Paulina Fusi M.D. On: 09/10/2020 19:00  DG Lumbar Spine 2-3 Views  Narrative CLINICAL DATA:  low back pain  EXAM: LUMBAR SPINE - 2-3 VIEW  COMPARISON:  07/15/2019  FINDINGS: Previous posterior instrumented fusion with rod extending to the right lamina of L4, proximal margin not visualized. solid-appearing fusion bone involving posterior elements. A thoracolumbar dextroscoliosis is partially visualized. No fracture or dislocation. Facet DJD L4-S1. Tubal ligation clips are noted.  IMPRESSION: 1. No acute findings. 2. Postop and degenerative changes as above.   Electronically Signed By: Corlis Leak M.D. On: 05/28/2021 17:07  Narrative CLINICAL DATA:  Low back pain.  EXAM: LUMBAR SPINE - COMPLETE WITH BENDING VIEWS  COMPARISON:  CT 02/24/2010.  FINDINGS: Thoracolumbar fusion again noted. Visualized hardware appears stable. No flexion or extension abnormality identified. Severe lumbar spine scoliosis concave left again noted. Diffuse degenerative change. No acute bony abnormality. No evidence of fracture. Bilateral tubal ligation clips noted in the pelvis.  IMPRESSION: 1.  Thoracolumbar fusion again noted. Visualized hardware appears stable. No flexion or extension abnormality identified.  2. Severe lumbar spine scoliosis concave left again noted. Diffuse degenerative change lumbar spine. No acute bony abnormality identified.   Electronically Signed By: Maisie Fus  Register On: 07/16/2019 06:56   Narrative CLINICAL DATA:  Sacroiliac joint pain.  EXAM: BILATERAL SACROILIAC JOINTS - 3+ VIEW  COMPARISON:  CT 02/24/2010.  FINDINGS: Prior lumbar spine fusion. Degenerative change lumbar spine. Mild degenerative changes both SI joints and both hips. No evidence of erosive arthropathy. No acute bony abnormality identified. No evidence of fracture. Bilateral tubal ligation clips noted the pelvis.  IMPRESSION: Prior lumbar spine fusion. Degenerative changes lumbar spine, both SI joints, both hips. No evidence of erosive arthropathy. No acute bony abnormality identified.   Electronically Signed By: Maisie Fus  Register On: 07/16/2019 06:58  Narrative CLINICAL DATA:  Right hip pain.  Arthralgia.  EXAM: DG HIP (WITH OR WITHOUT PELVIS) 2-3V RIGHT  COMPARISON:  Abdomen 07/08/2010.  FINDINGS: Postsurgical changes lumbar spine. Degenerative changes lumbar spine, both SI joints, and both hips. No evidence of erosive arthropathy. No acute abnormality identified. Right hip is intact. Bilateral tubal ligation clips noted the pelvis.  IMPRESSION: Postsurgical changes lumbar spine. Degenerative changes lumbar spine, both SI joints, and both hips. No evidence of erosive arthropathy. No acute abnormality identified. Right hip is intact.   Electronically Signed By: Maisie Fus  Register On: 07/16/2019 07:00   Narrative CLINICAL DATA:  Right hip pain.  Arthralgia.  EXAM: RIGHT KNEE - 1-2 VIEW  COMPARISON:  No recent prior.  FINDINGS: Mild patellofemoral and lateral compartment degenerative change. No evidence of erosive arthropathy. No  acute bony  abnormality identified. No evidence of fracture dislocation. No effusion noted.  IMPRESSION: Mild patellofemoral lateral compartment degenerative change. No acute abnormality identified.   Electronically Signed By: Marcello Moores  Register On: 07/16/2019 07:01   Narrative CLINICAL DATA:  Pain in the left wrist joint after injury.  EXAM: LEFT WRIST - COMPLETE 3+ VIEW  COMPARISON:  None.  FINDINGS: There is no evidence of fracture or dislocation. There is no evidence of arthropathy or other focal bone abnormality. Soft tissues are unremarkable.  IMPRESSION: Negative.   Electronically Signed By: Lucienne Capers M.D. On: 07/15/2015 23:48    Complexity Note: Imaging results reviewed.                         ROS  Cardiovascular: Weak heart (CHF) Pulmonary or Respiratory: Wheezing and difficulty taking a deep full breath (Asthma), Shortness of breath, Smoking, Snoring , and Temporary stoppage of breathing during sleep Neurological: Curved spine Psychological-Psychiatric: Anxiousness and Depressed Gastrointestinal: Reflux or heatburn Genitourinary: No reported renal or genitourinary signs or symptoms such as difficulty voiding or producing urine, peeing blood, non-functioning kidney, kidney stones, difficulty emptying the bladder, difficulty controlling the flow of urine, or chronic kidney disease Hematological: Brusing easily Endocrine: No reported endocrine signs or symptoms such as high or low blood sugar, rapid heart rate due to high thyroid levels, obesity or weight gain due to slow thyroid or thyroid disease Rheumatologic: Rheumatoid arthritis Musculoskeletal: Negative for myasthenia gravis, muscular dystrophy, multiple sclerosis or malignant hyperthermia Work History: Disabled  Allergies  Tracey White has No Known Allergies.  Laboratory Chemistry Profile   Renal Lab Results  Component Value Date   BUN 16 02/14/2022   CREATININE 0.73 02/14/2022   BCR 9 07/14/2019    GFRAA 119 07/14/2019   GFRNONAA >60 02/14/2022   PROTEINUR 30 (A) 05/28/2021     Electrolytes Lab Results  Component Value Date   NA 137 02/14/2022   K 3.4 (L) 02/14/2022   CL 105 02/14/2022   CALCIUM 8.7 (L) 02/14/2022   MG 2.1 07/14/2019     Hepatic Lab Results  Component Value Date   AST 12 07/14/2019   ALT 11 (L) 03/24/2017   ALBUMIN 3.9 07/14/2019   ALKPHOS 111 07/14/2019     ID Lab Results  Component Value Date   HIV Non Reactive 12/25/2016   SARSCOV2NAA NEGATIVE 07/19/2021   PREGTESTUR NEGATIVE 05/28/2021     Bone Lab Results  Component Value Date   25OHVITD1 39 07/14/2019   25OHVITD2 29 07/14/2019   25OHVITD3 10.0 07/14/2019     Endocrine Lab Results  Component Value Date   GLUCOSE 75 02/14/2022   GLUCOSEU NEGATIVE 05/28/2021   HGBA1C 5.5 07/14/2019   TSH 1.005 03/19/2017     Neuropathy Lab Results  Component Value Date   VITAMINB12 907 07/14/2019   FOLATE 8.1 06/25/2017   HGBA1C 5.5 07/14/2019   HIV Non Reactive 12/25/2016     CNS No results found for: "COLORCSF", "APPEARCSF", "RBCCOUNTCSF", "WBCCSF", "POLYSCSF", "LYMPHSCSF", "EOSCSF", "PROTEINCSF", "GLUCCSF", "JCVIRUS", "CSFOLI", "IGGCSF", "LABACHR", "ACETBL"   Inflammation (CRP: Acute  ESR: Chronic) Lab Results  Component Value Date   CRP 52 (H) 07/14/2019   ESRSEDRATE 95 (H) 07/14/2019     Rheumatology Lab Results  Component Value Date   RF <10.0 01/08/2017   ANA Positive (A) 01/08/2017   LABURIC 7.2 (H) 10/23/2020   URICUR 18.7 08/09/2019     Coagulation Lab Results  Component Value Date  PLT 325 02/14/2022   DDIMER <0.27 12/24/2016     Cardiovascular Lab Results  Component Value Date   BNP 17.0 03/24/2017   TROPONINI <0.03 09/25/2017   HGB 10.0 (L) 02/14/2022   HCT 33.5 (L) 02/14/2022     Screening Lab Results  Component Value Date   SARSCOV2NAA NEGATIVE 07/19/2021   HIV Non Reactive 12/25/2016   PREGTESTUR NEGATIVE 05/28/2021     Cancer No results found  for: "CEA", "CA125", "LABCA2"   Allergens No results found for: "ALMOND", "APPLE", "ASPARAGUS", "AVOCADO", "BANANA", "BARLEY", "BASIL", "BAYLEAF", "GREENBEAN", "LIMABEAN", "WHITEBEAN", "BEEFIGE", "REDBEET", "BLUEBERRY", "BROCCOLI", "CABBAGE", "MELON", "CARROT", "CASEIN", "CASHEWNUT", "CAULIFLOWER", "CELERY"     Note: Lab results reviewed.  PFSH  Drug: Tracey White  reports no history of drug use. Alcohol:  reports current alcohol use of about 1.0 - 2.0 standard drink of alcohol per week. Tobacco:  reports that she has been smoking cigarettes. She has a 10.00 pack-year smoking history. She has never used smokeless tobacco. 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 Family: family history includes Cancer in her maternal grandmother; Diabetes in her mother; Hypertension in her mother.  Past Surgical History:  Procedure Laterality Date   HERNIA REPAIR     LAPAROSCOPIC GASTRIC SLEEVE RESECTION N/A 2021   scoliosis repair     x2   TUBAL LIGATION     Active Ambulatory Problems    Diagnosis Date Noted   Chest pain at rest 10/18/2015   Chronic diastolic heart failure (HCC) 05/15/2016   Sinus tachycardia 10/31/2015   Obstructive sleep apnea 05/15/2016   Tobacco use 05/15/2016   Chest pain 05/31/2016   Hypokalemia  06/06/2016   Depression 08/27/2016   Chronic low back pain (1ry area of Pain) (Bilateral) (R>L) w/o sciatica 08/27/2016   Skin lesion of left leg 09/05/2016   Lymphedema 09/24/2016   Chronic knee pain (Bilateral) (R>L) 12/12/2016   Chronic thoracic back pain (Fourth Area of Pain) (Bilateral) (R>L) 12/12/2016   Chronic pain syndrome 12/12/2016   Disorder of skeletal system 12/12/2016   Other long term (current) drug therapy 12/12/2016   Scoliosis 12/12/2016   Asthma without status asthmaticus 12/24/2016   Anemia 12/25/2016   Acute diastolic heart failure (HCC) 01/01/2017   Neurogenic pain 01/08/2017   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   Pharmacologic therapy 01/08/2017   Problems influencing health status 01/08/2017   Chronic lower extremity pain (Bilateral) (R>L) 01/08/2017   Elevated C-reactive protein (CRP) 01/08/2017   Elevated sed rate 01/08/2017   Vitamin D deficiency 01/08/2017   Class 3 severe obesity due to excess calories with serious comorbidity and body mass index (BMI) of 50.0 to 59.9 in adult (HCC) 01/08/2017   Positive ANA (antinuclear antibody) 02/04/2017   Positive antinuclear antibody 02/04/2017   Iron deficiency anemia 03/19/2017   B12 deficiency 04/16/2017   Goals of care, counseling/discussion 05/07/2017   AIDS (HCC) 05/07/2017   Long term current use of opiate analgesic 06/02/2017   Hypertensive heart disease 07/22/2017   Chronic diastolic congestive heart failure (HCC) 04/02/2017  Prediabetes 07/13/2019   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   Elevated hemoglobin A1c 07/13/2019   Lumbar facet syndrome (Bilateral) (R>L) 07/14/2019   Chronic sacroiliac joint pain (Right) 07/14/2019   Chronic lower extremity pain (2ry area of Pain) (Right) 07/14/2019   Chronic knee pain (3ry area of Pain) (Right) 07/14/2019   Osteoarthritis of knees (Bilateral) 07/14/2019   Chronic hip pain (4th area of Pain) (Right) 07/14/2019   Bilateral sacroiliitis  (HCC) 10/28/2019   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) 11/11/2019   DDD (degenerative disc disease), lumbosacral 11/23/2019   Spondylosis without myelopathy or radiculopathy, lumbosacral region 11/23/2019   Chronic radicular lumbar pain 03/07/2022   Resolved Ambulatory Problems    Diagnosis Date Noted   Other specified health status 12/12/2016   Past Medical History:  Diagnosis Date   Anxiety    Asthma    CHF (congestive heart failure) (HCC)    Chronic low back pain with sciatica    Difficult intubation    DOE (dyspnea on exertion)    Endometrial polyp    Gastritis    GERD (gastroesophageal reflux disease)    Lumbosacral facet joint syndrome    Major depressive disorder    Microcytic anemia    Morbid obesity (HCC)    OSA (obstructive sleep apnea)    Peripheral edema    Pre-diabetes    Status post laparoscopic sleeve gastrectomy    Vasovagal syncope    Constitutional Exam  General appearance: Well nourished, well developed, and well hydrated. In no apparent acute distress Vitals:   03/07/22 0909  BP: 114/70  Pulse: 78  Temp: (!) 97.4 F (36.3 C)  SpO2: 100%  Weight: 249 lb (112.9 kg)  Height: 5\' 1"  (1.549 m)   BMI Assessment: Estimated body mass index is 47.05 kg/m as calculated from the following:   Height as of this encounter: 5\' 1"  (1.549 m).   Weight as of this encounter: 249 lb (112.9 kg).  BMI interpretation table: BMI level Category Range association with higher incidence of chronic pain  <18 kg/m2 Underweight   18.5-24.9 kg/m2 Ideal body weight   25-29.9 kg/m2 Overweight Increased incidence by 20%  30-34.9 kg/m2 Obese (Class I) Increased incidence by 68%  35-39.9 kg/m2 Severe obesity (Class II) Increased incidence by 136%  >40 kg/m2 Extreme obesity (Class III) Increased incidence by 254%   Patient's current BMI Ideal Body weight  Body mass index is 47.05 kg/m. Ideal body weight: 47.8 kg (105 lb 6.1  oz) Adjusted ideal body weight: 73.9 kg (162 lb 13.3 oz)   BMI Readings from Last 4 Encounters:  03/07/22 47.05 kg/m  02/13/22 47.95 kg/m  07/19/21 44.59 kg/m  05/28/21 50.07 kg/m   Wt Readings from Last 4 Encounters:  03/07/22 249 lb (112.9 kg)  02/13/22 257 lb 15 oz (117 kg)  07/19/21 236 lb (107.1 kg)  05/28/21 265 lb (120.2 kg)    Psych/Mental status: Alert, oriented x 3 (person, place, & time)       Eyes: PERLA Respiratory: No evidence of acute respiratory distress  Thoracic Spine Area Exam  Skin & Axial Inspection: Well healed scar from previous spine surgery detected Alignment: Asymmetric Functional ROM: Pain restricted ROM Stability: No instability detected Muscle Tone/Strength: Functionally intact. No obvious neuro-muscular anomalies detected. Sensory (Neurological): Neurogenic pain pattern Muscle strength & Tone: No palpable anomalies Lumbar Spine Area Exam  Skin & Axial Inspection: Well healed scar from previous spine surgery detected Alignment: Scoliosis detected Functional ROM: Restricted ROM  affecting both sides Stability: No instability detected Muscle Tone/Strength: Functionally intact. No obvious neuro-muscular anomalies detected. Sensory (Neurological): Neurogenic pain pattern  Gait & Posture Assessment  Ambulation: Unassisted Gait: Antalgic gait (limping) Posture: Difficulty with positional changes  Lower Extremity Exam    Side: Right lower extremity  Side: Left lower extremity  Stability: No instability observed          Stability: No instability observed          Skin & Extremity Inspection: Skin color, temperature, and hair growth are WNL. No peripheral edema or cyanosis. No masses, redness, swelling, asymmetry, or associated skin lesions. No contractures.  Skin & Extremity Inspection: Skin color, temperature, and hair growth are WNL. No peripheral edema or cyanosis. No masses, redness, swelling, asymmetry, or associated skin lesions. No  contractures.  Functional ROM: Pain restricted ROM for hip joint          Functional ROM: Unrestricted ROM                  Muscle Tone/Strength: Functionally intact. No obvious neuro-muscular anomalies detected.  Muscle Tone/Strength: Functionally intact. No obvious neuro-muscular anomalies detected.  Sensory (Neurological): Unimpaired        Sensory (Neurological): Unimpaired        DTR: Patellar: deferred today Achilles: deferred today Plantar: deferred today  DTR: Patellar: deferred today Achilles: deferred today Plantar: deferred today  Palpation: No palpable anomalies  Palpation: No palpable anomalies    Assessment  Primary Diagnosis & Pertinent Problem List: The primary encounter diagnosis was Failed back surgical syndrome. Diagnoses of Chronic radicular lumbar pain and Juvenile idiopathic scoliosis of thoracolumbar region were also pertinent to this visit.  Visit Diagnosis (New problems to examiner): 1. Failed back surgical syndrome   2. Chronic radicular lumbar pain   3. Juvenile idiopathic scoliosis of thoracolumbar region    Plan of Care (Initial workup plan)  I had a long discussion with Tracey White regarding spinal cord stimulation and whether she would be an appropriate candidate.  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  I informed her of this.  Unfortunately, she is not a suitable candidate for safe percutaneous spinal cord stimulator trial.   Imaging Orders         DG [facility] C-ARM 1-60 MIN NO REPORT         Provider-requested follow-up: No follow-ups on file.  No future appointments.  Duration of encounter: 13minutes.  Total time on encounter, as per AMA guidelines included  both the face-to-face and non-face-to-face time personally spent by the physician and/or other qualified health care professional(s) on the day of the encounter (includes time in activities that require the physician or other qualified health care professional and does not include time in activities normally performed by clinical staff). Physician's time may include the following activities when performed: Preparing to see the patient (e.g., pre-charting review of records, searching for previously ordered imaging, lab work, and nerve conduction tests) Review of prior analgesic pharmacotherapies. Reviewing PMP Interpreting ordered tests (e.g., lab work, imaging, nerve conduction tests) Performing post-procedure evaluations, including interpretation of diagnostic procedures Obtaining and/or reviewing separately obtained history Performing a medically appropriate examination and/or evaluation Counseling and educating the patient/family/caregiver Ordering medications, tests, or procedures Referring and  communicating with other health care professionals (when not separately reported) Documenting clinical information in the electronic or other health record Independently interpreting results (not separately reported) and communicating results to the patient/ family/caregiver Care coordination (not separately reported)  Note by: Edward Jolly, MD Date: 03/07/2022; Time: 11:36 AM

## 2022-03-29 NOTE — H&P (Signed)
Ms. Valladolid is a 42 y.o. female here for  Fx D+C , myosure and placement of Mirena IUD  .pt here for follow up for OAB and menorrhagia   Pt didn't start Myrbetriq ( too expensive )  Pathology [facility] :  Comment: Part A-Endometrial Biopsy: SECRETORY ENDOMETRIUM,AND POLYPOID FRAGMENTS SUGGESTIVE OF BENIGN ENDOMETRIAL POLYP. NO HYPERPLASIA OR CARCINOMA          Past Medical History:  has a past medical history of Allergy, Anxiety, Arthritis, Asthma without status asthmaticus, BMI 60.0-69.9, adult (CMS-HCC), CHF (congestive heart failure) (CMS-HCC), Chronic heart failure (CMS-HCC), Chronic pain syndrome, Depression (2009), Heart palpitations, Hypokalemia (01/09/2017), Microcytic anemia (01/09/2017), Obesity, Prediabetes (01/09/2017), Scoliosis, Sleep apnea, and Vitamin D deficiency, unspecified.  Past Surgical History:  has a past surgical history that includes Inguinal hernia repair (05/23/2007); scoliosis surgery (1989, 1991); Repair Incisional Hernia Laparoscopic Incarcerated/Strangulated (06/2010); Tubal ligation; and bariatric surgery (01/06/2019). Family History: family history includes Aneurysm in her mother; Colon cancer in her maternal uncle; Diabetes in her mother; High blood pressure (Hypertension) in her mother; No Known Problems in her father; Obesity in her daughter; Seizures in her mother; Throat cancer in her maternal grandmother. Social History:  reports that she has been smoking cigarettes. She has a 4.4 pack-year smoking history. She has never used smokeless tobacco. She reports that she does not currently use alcohol. She reports that she does not use drugs. OB/GYN History:  OB History       Gravida  2   Para  1   Term      Preterm      AB  1   Living  1        SAB      IAB      Ectopic      Molar      Multiple      Live Births  1             Allergies: has No Known Allergies. Medications:   Current Outpatient Medications:    acetaminophen (TYLENOL  ARTHRITIS ORAL), Take by mouth as needed, Disp: , Rfl:    bumetanide (BUMEX) 1 MG tablet, Take 1 tablet (1 mg total) by mouth 2 (two) times daily, Disp: 180 tablet, Rfl: 3   buPROPion (WELLBUTRIN SR) 150 MG SR tablet, Take 150 mg by mouth 2 (two) times daily with meals, Disp: , Rfl:    busPIRone (BUSPAR) 15 MG tablet, , Disp: , Rfl:    fluticasone propionate (FLONASE) 50 mcg/actuation nasal spray, Place 2 sprays into both nostrils once daily, Disp: 16 g, Rfl: 11   naltrexone (REVIA) 50 mg tablet, Take 50 mg by mouth once daily, Disp: , Rfl:    potassium chloride (K-TAB) 20 mEq TbER ER tablet, Take 1 tablet (20 mEq total) by mouth once daily as needed, Disp: 90 tablet, Rfl: 1   QUEtiapine (SEROQUEL) 25 MG tablet, TAKE (3) TABLETS BY MOUTH NIGHTLY, Disp: , Rfl:    busPIRone (BUSPAR) 7.5 MG tablet, , Disp: , Rfl:    cetirizine (ZYRTEC) 10 MG tablet, Take 1 tablet (10 mg total) by mouth once daily (Patient not taking: Reported on 01/11/2022), Disp: 30 tablet, Rfl: 11   solifenacin (VESICARE) 5 MG tablet, Take 1 tablet (5 mg total) by mouth once daily, Disp: 30 tablet, Rfl: 11   Review of Systems: General:                      No fatigue or weight  loss Eyes:                           No vision changes Ears:                            No hearing difficulty Respiratory:                No cough or shortness of breath Pulmonary:                  No asthma or shortness of breath Cardiovascular:           No chest pain, palpitations, dyspnea on exertion Gastrointestinal:          No abdominal bloating, chronic diarrhea, constipations, masses, pain or hematochezia Genitourinary:             No hematuria, dysuria, abnormal vaginal discharge, pelvic pain, Menometrorrhagia Lymphatic:                   No swollen lymph nodes Musculoskeletal:         No muscle weakness Neurologic:                  No extremity weakness, syncope, seizure disorder Psychiatric:                  No history of depression,  delusions or suicidal/homicidal ideation      Exam:         Vitals:    04/01/22  BP: (!) 142/98  Pulse: 87      Body mass index is 47.59 kg/m.   WDWN / black female in NAD   Lungs: CTA  CV : RRR without murmur   Breast: exam done in sitting and lying position : No dimpling or retraction, no dominant mass, no spontaneous discharge, no axillary adenopathy Neck:  no thyromegaly Abdomen: soft , no mass, normal active bowel sounds,  non-tender, no rebound tenderness Pelvic: tanner stage 5 ,  External genitalia: vulva /labia no lesions Urethra: no prolapse Vagina: normal physiologic d/c Cervix:  high , no lesions, no cervical motion tenderness   Uterus: normal size shape and contour, non-tender Adnexa: no mass,  non-tender   Rectovaginal:    Impression:    The primary encounter diagnosis was Menorrhagia with irregular cycle. A diagnosis of OAB (overactive bladder) was also pertinent to this visit.       Plan:    Spoke to her about Fx D+C , possible myosure  and placement of Mirena IUD in OR .   risks discussed .  Cardiology clearance obtained   Please reference [facility] notes             Kellin Bartling Samuel Germany, MD

## 2022-04-03 ENCOUNTER — Encounter
Admission: RE | Admit: 2022-04-03 | Discharge: 2022-04-03 | Disposition: A | Discharge status: Home / Self Care | Payer: 59 | Source: Ambulatory Visit | Attending: Obstetrics and Gynecology | Admitting: Obstetrics and Gynecology

## 2022-04-03 VITALS — Ht 61.5 in | Wt 259.0 lb

## 2022-04-03 DIAGNOSIS — Z79899 Other long term (current) drug therapy: Secondary | ICD-10-CM

## 2022-04-03 DIAGNOSIS — Z01818 Encounter for other preprocedural examination: Secondary | ICD-10-CM

## 2022-04-03 DIAGNOSIS — D509 Iron deficiency anemia, unspecified: Secondary | ICD-10-CM

## 2022-04-03 DIAGNOSIS — I5032 Chronic diastolic (congestive) heart failure: Secondary | ICD-10-CM

## 2022-04-03 DIAGNOSIS — Z01812 Encounter for preprocedural laboratory examination: Secondary | ICD-10-CM

## 2022-04-03 DIAGNOSIS — I110 Hypertensive heart disease with heart failure: Secondary | ICD-10-CM

## 2022-04-03 HISTORY — DX: Excessive and frequent menstruation with regular cycle: N92.0

## 2022-04-03 HISTORY — DX: Overactive bladder: N32.81

## 2022-04-03 NOTE — Patient Instructions (Addendum)
Your procedure is scheduled on:04-11-22 Thursday Report to the Registration Desk on the 1st floor of the [facility].Then proceed to the 2nd floor Surgery Desk To find out your arrival time, please call [phone] between 1PM - 3PM on:04-10-22 Wednesday If your arrival time is 6:00 am, do not arrive before that time as the [facility] entrance doors do not open until 6:00 am.  REMEMBER: Instructions that are not followed completely may result in serious medical risk, up to and including death; or upon the discretion of your surgeon and anesthesiologist your surgery may need to be rescheduled.  Do not eat food after midnight the night before surgery.  No gum chewing or hard candies.  You may however, drink CLEAR liquids up to 2 hours before you are scheduled to arrive for your surgery. Do not drink anything within 2 hours of your scheduled arrival time.  Clear liquids include: - water  - apple juice without pulp - gatorade (not RED colors) - black coffee or tea (Do NOT add milk or creamers to the coffee or tea) Do NOT drink anything that is not on this list.  In addition, your doctor has ordered for you to drink the provided:  Ensure Pre-Surgery Clear Carbohydrate Drink  Drinking this carbohydrate drink up to two hours before surgery helps to reduce insulin resistance and improve patient outcomes. Please complete drinking 2 hours before scheduled arrival time.  One week prior to surgery: Stop Anti-inflammatories (NSAIDS) such as Advil, Aleve, Ibuprofen, Motrin, Naproxen, Naprosyn and Aspirin based products such as Excedrin, Goody's Powder, BC Powder.You may however, continue to take Tylenol if needed for pain up until the day of surgery.  Stop ANY OVER THE COUNTER supplements/vitamins NOW (04-03-22) until after surgery.Continue your Potassium up until the day prior to surgery  [place] WITH A SIP OF WATER: -buPROPion (WELLBUTRIN SR)   -solifenacin (VESICARE)  -omeprazole (PRILOSEC OTC)-take one the night before surgery and one the morning of surgery-helps with nausea  No Alcohol for 24 hours before or after surgery.  No Smoking including e-cigarettes for 24 hours before surgery.  No chewable tobacco products for at least 6 hours before surgery.  No nicotine patches on the day of surgery.  Do not use any "recreational" drugs for at least a week (preferably 2 weeks) before your surgery.  Please be advised that the combination of cocaine and anesthesia may have negative outcomes, up to and including death. If you test positive for cocaine, your surgery will be cancelled.  On the morning of surgery brush your teeth with toothpaste and water, you may rinse your mouth with mouthwash if you wish. Do not swallow any toothpaste or mouthwash.  Do not wear jewelry, make-up, hairpins, clips or nail polish.  Do not wear lotions, powders, or perfumes.   Do not shave body hair from the neck down 48 hours before surgery.  Contact lenses, hearing aids and dentures may not be worn into surgery.  Do not bring valuables to the hospital. [facility] is not responsible for any missing/lost belongings or valuables.   Notify your doctor if there is any change in your medical condition (cold, fever, infection).  Wear comfortable clothing (specific to your surgery type) to the hospital.  After surgery, you can help prevent lung complications by doing breathing exercises.  Take deep breaths and cough every 1-2 hours. Your doctor may order a device called an Incentive Spirometer to help you take deep breaths. When  coughing or sneezing, hold a pillow firmly against your incision with both hands. This is called "splinting." Doing this helps protect your incision. It also decreases belly discomfort.  If you are being admitted to the hospital overnight, leave your suitcase in the car. After surgery it may be brought to your room.  In case  of increased patient census, it may be necessary for you, the patient, to continue your postoperative care in the Same Day Surgery department.  If you are being discharged the day of surgery, you will not be allowed to drive home. You will need a responsible individual to drive you home and stay with you for 24 hours after surgery.   If you are taking public transportation, you will need to have a responsible individual with you.  Please call the [facility]. at [phone] if you have any questions about these instructions.  Surgery Visitation Policy:  Patients undergoing a surgery or procedure may have two family members or support persons with them as long as the person is not COVID-19 positive or experiencing its symptoms.   Due to an increase in RSV and influenza rates and associated hospitalizations, children ages 25 and under will not be able to visit patients in [facility]. Masks continue to be strongly recommended.  How to Use an Incentive Spirometer An incentive spirometer is a tool that measures how well you are filling your lungs with each breath. Learning to take long, deep breaths using this tool can help you keep your lungs clear and active. This may help to reverse or lessen your chance of developing breathing (pulmonary) problems, especially infection. You may be asked to use a spirometer: After a surgery. If you have a lung problem or a history of smoking. After a long period of time when you have been unable to move or be active. If the spirometer includes an indicator to show the highest number that you have reached, your health care provider or respiratory therapist will help you set a goal. Keep a log of your progress as told by your health care provider. What are the risks? Breathing too quickly may cause dizziness or cause you to pass out. Take your time so you do not get dizzy or light-headed. If you are in pain, you may need to take pain  medicine before doing incentive spirometry. It is harder to take a deep breath if you are having pain. How to use your incentive spirometer  Sit up on the edge of your bed or on a chair. Hold the incentive spirometer so that it is in an upright position. Before you use the spirometer, breathe out normally. Place the mouthpiece in your mouth. Make sure your lips are closed tightly around it. Breathe in slowly and as deeply as you can through your mouth, causing the piston or the ball to rise toward the top of the chamber. Hold your breath for 3-5 seconds, or for as long as possible. If the spirometer includes a coach indicator, use this to guide you in breathing. Slow down your breathing if the indicator goes above the marked areas. Remove the mouthpiece from your mouth and breathe out normally. The piston or ball will return to the bottom of the chamber. Rest for a few seconds, then repeat the steps 10 or more times. Take your time and take a few normal breaths between deep breaths so that you do not get dizzy or light-headed. Do this every 1-2 hours when you are  awake. If the spirometer includes a goal marker to show the highest number you have reached (best effort), use this as a goal to work toward during each repetition. After each set of 10 deep breaths, cough a few times. This will help to make sure that your lungs are clear. If you have an incision on your chest or abdomen from surgery, place a pillow or a rolled-up towel firmly against the incision when you cough. This can help to reduce pain while taking deep breaths and coughing. General tips When you are able to get out of bed: Walk around often. Continue to take deep breaths and cough in order to clear your lungs. Keep using the incentive spirometer until your health care provider says it is okay to stop using it. If you have been in the hospital, you may be told to keep using the spirometer at home. Contact a health care provider  if: You are having difficulty using the spirometer. You have trouble using the spirometer as often as instructed. Your pain medicine is not giving enough relief for you to use the spirometer as told. You have a fever. Get help right away if: You develop shortness of breath. You develop a cough with bloody mucus from the lungs. You have fluid or blood coming from an incision site after you cough. Summary An incentive spirometer is a tool that can help you learn to take long, deep breaths to keep your lungs clear and active. You may be asked to use a spirometer after a surgery, if you have a lung problem or a history of smoking, or if you have been inactive for a long period of time. Use your incentive spirometer as instructed every 1-2 hours while you are awake. If you have an incision on your chest or abdomen, place a pillow or a rolled-up towel firmly against your incision when you cough. This will help to reduce pain. Get help right away if you have shortness of breath, you cough up bloody mucus, or blood comes from your incision when you cough. This information is not intended to replace advice given to you by your health care provider. Make sure you discuss any questions you have with your health care provider. Document Revised: 04/05/2019 Document Reviewed: 04/05/2019 Elsevier Patient Education  [place].

## 2022-04-05 ENCOUNTER — Encounter: Payer: Self-pay | Admitting: Obstetrics and Gynecology

## 2022-04-05 NOTE — Progress Notes (Signed)
Perioperative / Anesthesia Services  Pre-Admission Testing Clinical Review / Preoperative Anesthesia Consult  Date: 04/07/22  Patient Demographics:  Name: Tracey White DOB:   Sep 03, 1980 MRN:   [id number]  Planned Surgical Procedure(s):    Case: [id number] Date/Time: 04/11/22 0715   Procedures:      FRACTIONAL DILATATION & CURETTAGE/HYSTEROSCOPY WITH MYOSURE     INTRAUTERINE DEVICE (IUD) INSERTION - MIRENA   Anesthesia type: Choice   Pre-op diagnosis: menorrhagia, endometrial polyps   Location: [facility] OR ROOM 05 / [place] [facility]   Surgeons: Schermerhorn, Gwen Her, MD     NOTE: Available PAT nursing documentation and vital signs have been reviewed. Clinical nursing staff has updated patient's PMH/PSHx, current medication list, and drug allergies/intolerances to ensure comprehensive history available to assist in medical decision making as it pertains to the aforementioned surgical procedure and anticipated anesthetic course. Extensive review of available clinical information personally performed. [place] [facility] and [facility] updated with any diagnoses/procedures that  may have been inadvertently omitted during her intake with the pre-admission testing department's nursing staff.  Clinical Discussion:  Tracey White is a 42 y.o. female who is submitted for pre-surgical anesthesia review and clearance prior to her undergoing the above procedure. Patient is a Current Smoker (10 pack years). Pertinent PMH includes: CHF, sinus tachycardia, prediabetes, DOE, OSAH (no longer requires nocturnal PAP therapy), asthma, GERD (on daily PPI), gastritis, microcytic anemia, menorrhagia, endometrial polyp, lymphedema, chronic lower back pain, chronic pain syndrome, scoliosis, anxiety, depression.  Patient is followed by cardiology Zigmund Daniel, MD). She was last seen in the cardiology clinic on 03/06/2023; notes reviewed. At the time of her clinic visit, patient doing well overall from a cardiovascular  perspective. Patient reported some intermittent episodes of chest "heaviness" associated with her anxiety. Patient also with ongoing exertional dyspnea, however this symptom reported to be stable and at baseline. Previously seen for syncope. Patient reported that she had not experienced any further syncopal episodes in over a year. Patient denied any chest pain, PND, orthopnea, palpitations, significant peripheral edema, weakness, fatigue, or significant vertiginous symptoms. Patient with a past medical history significant for cardiovascular diagnoses. Documented physical exam was grossly benign, providing no evidence of acute exacerbation and/or decompensation of the patient's known cardiovascular conditions.  Most recent TTE was performed on 03/20/2022 revealing a normal left ventricular systolic function with an EF of >55%. There were no regional wall motion abnormalities. Average left ventricular GLS -16.7%. Left atrium was mildly enlarged; indexed to 35 mL/m2. There was trivial mitral, tricuspid, and pulmonary valve regurgitation. All transvalvular gradients were noted to be normal providing no evidence suggestive of valvular stenosis.  Long term event monitor study was performed on 02/25/2022 revealing a predominant underlying sinus rhythm with an average rate of 95 bpm. Rare atrial and ventricular ectomy noted (<1.0% burden).  No ventricular or supraventricular tachycardia episodes. No sinus pauses, sinus arrest, or high degree AV block. 5 patient triggered events corresponding to sinus rhythm and sinus tachycardia.   Blood pressure well controlled at 122/72 mmHg on currently prescribed diuretic (bumetanide) and vasodilator (hydralazine) therapies.  Patient into on any type of lipid lower therapies for ASCVD prevention. She does have a prediabetes diagnosis; last HgbA1c was 5.7% when checked on 06/16/2021. Patient with an OSAH diagnosis, however following bariatric surgery, she no longer requires  nocturnal PAP therapy. Functional capacity, as defined by DASI, is documented as being >/= 4 METS. No changes were made to her medication regimen during her visit with cardiology.  Patient scheduled to continue with regularly scheduled follow-up visits with outpatient cardiology for ongoing care and management of her known cardiovascular diagnoses.   Tracey White is scheduled for an [place]; INTRAUTERINE DEVICE (IUD) INSERTION - MIRENA on 04/11/2022 with Dr. Laverta Baltimore, MD.  Given patient's past medical history significant for cardiovascular diagnoses, presurgical cardiac clearance was sought by the PAT team. Per cardiology, "this patient is optimized for surgery and may proceed with the planned procedural course with a MODERATE risk of significant perioperative cardiovascular complications".   In review of her medication reconciliation, the patient is not noted to be taking any type of anticoagulation or antiplatelet therapies that would need to be held during her perioperative course.  Patient denies previous perioperative complications with anesthesia in the past.  Patient reports that she has previously been advised that she was a (+) difficult intubation. Patient presented to [facility] for tubal ligation, however due to morbid obesity, patient had to be transferred to [facility] for procedure. She has since undergone sleeve gastrectomy, resulting in a significant weight loss. Patient reports that she has been intubated without difficulties since her weight loss. In review of the available records, it is noted that patient underwent a general anesthetic course at [facility] (ASA III) in 12/2018 without documented complications.      04/03/2022    1:00 PM 03/07/2022    9:09 AM 02/13/2022   10:00 AM  Vitals with BMI  Height 5' 1.5" '5\' 1"'$  5' 1.5"  Weight 259 lbs 1 oz 249 lbs 257 lbs 15 oz  BMI 48.16 [id number]   Systolic  [id number]   Diastolic  70   Pulse  78     Providers/Specialists:   NOTE: Primary physician provider listed below. Patient may have been seen by APP or partner within same practice.   PROVIDER ROLE / SPECIALTY LAST Cannon Kettle, MD OB/GYN (Surgeon) 04/01/2022  Hilton Sinclair, [place] Primary Care Provider 12/12/2021  Kenard Gower, MD Cardiology 03/05/2022  Gillis Santa, MD Pain Management 03/07/2022  Girtha Hake, MD Physiatry 01/02/2022   Allergies:  Patient has no known allergies.  Current Home Medications:   No current facility-administered medications for this encounter.    acetaminophen (TYLENOL) 650 MG CR tablet   Aspirin-Caffeine 845-65 MG PACK   bumetanide (BUMEX) 1 MG tablet   buPROPion (WELLBUTRIN SR) 150 MG 12 hr tablet   busPIRone (BUSPAR) 7.5 MG tablet   fluticasone (FLONASE) 50 MCG/ACT nasal spray   HYDROXYZINE HCL PO   naltrexone (DEPADE) 50 MG tablet   omeprazole (PRILOSEC OTC) 20 MG tablet   potassium chloride SA (KLOR-CON M) 20 MEQ tablet   solifenacin (VESICARE) 5 MG tablet    technetium tetrofosmin (TC-MYOVIEW) injection [id number] millicurie   History:   Past Medical History:  Diagnosis Date   Anxiety    Asthma    B12 deficiency    CHF (congestive heart failure) ([place])    a.) TTE 10/19/2015: EF 65-70%; b.) TTE 04/04/2017: EF >55%; c.) TTE 08/14/2018: EF >55%, triv TR, RVSP 37; d.) TTE 03/20/2022: EF >55%, mild LAE, triv MR/TR/PR   Chronic low back pain with sciatica    Chronic pain syndrome    Depression    Difficult intubation    x 1 with Tubal ligation-came here to [facility] for tubal and anesthesia could not get her intubated-Was sent to [facility] for surgery-Has had bariatric surgery since with no issue   DOE (dyspnea  on exertion)    Endometrial polyp    Gastritis    GERD (gastroesophageal reflux disease)    Hypokalemia    Lumbosacral facet joint syndrome    Lymphedema    Major depressive disorder    Menorrhagia    Microcytic  anemia    Morbid obesity (HCC)    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)    OAB (overactive bladder)    OSA (obstructive sleep apnea)    a.) no longer requires nocturnal PAP therapy following significant weight loss associated with sleeve gastrectomy   Peripheral edema    Positive ANA (antinuclear antibody)    Pre-diabetes    Scoliosis    Sinus tachycardia    Status post laparoscopic sleeve gastrectomy    Vasovagal syncope    Vitamin D deficiency    Past Surgical History:  Procedure Laterality Date   HERNIA REPAIR     LAPAROSCOPIC GASTRIC SLEEVE RESECTION N/A 2021   scoliosis repair     x2   TUBAL LIGATION     Family History  Problem Relation Age of Onset   Diabetes Mother    Hypertension Mother    Cancer Maternal Grandmother    Social History   Tobacco Use   Smoking status: Every Day    Packs/day: 0.50    Years: 20.00    Total pack years: 10.00    Types: Cigarettes   Smokeless tobacco: Never  Vaping Use   Vaping Use: Never used  Substance Use Topics   Alcohol use: Yes    Alcohol/week: 1.0 - 2.0 standard drink of alcohol    Types: 1 - 2 Standard drinks or equivalent per week    Comment: occasionally   Drug use: No    Pertinent Clinical Results:  LABS:   No visits with results within 3 Day(s) from this visit.  Latest known visit with results is:  Hospital Outpatient Visit on 02/14/2022  Component Date Value Ref Range Status   WBC 02/14/2022 6.5  4.0 - 10.5 K/uL Final   RBC 02/14/2022 4.24  3.87 - 5.11 MIL/uL Final   Hemoglobin 02/14/2022 10.0 (L)  12.0 - 15.0 g/dL Final   HCT 02/14/2022 33.5 (L)  36.0 - 46.0 % Final   MCV 02/14/2022 79.0 (L)  80.0 - 100.0 fL Final   MCH 02/14/2022 23.6 (L)  26.0 - 34.0 pg Final   MCHC 02/14/2022 29.9 (L)  30.0 - 36.0 g/dL Final   RDW 02/14/2022 16.6 (H)  11.5 - 15.5 % Final   Platelets 02/14/2022 325  150 - 400 K/uL Final   nRBC 02/14/2022 0.0  0.0 - 0.2 % Final   Performed at  [facility], [address] [place], [place] [address]   ABO/RH(D) 02/14/2022 A POS   Final   Antibody Screen 02/14/2022 NEG   Final   Sample Expiration 02/14/2022 02/28/2022,2359   Final   Extend sample reason 02/14/2022    Final                   Value:NO TRANSFUSIONS OR PREGNANCY IN THE PAST 3 MONTHS Performed at [facility], [address] [place], [place] [address]    Sodium 02/14/2022 137  135 - 145 mmol/L Final   Potassium 02/14/2022 3.4 (L)  3.5 - 5.1 mmol/L Final   Chloride 02/14/2022 105  98 - 111 mmol/L Final   CO2 02/14/2022 27  22 - 32 mmol/L Final   Glucose, Bld 02/14/2022 75  70 -  99 mg/dL Final   Glucose reference range applies only to samples taken after fasting for at least 8 hours.   BUN 02/14/2022 16  6 - 20 mg/dL Final   Creatinine, Ser 02/14/2022 0.73  0.44 - 1.00 mg/dL Final   Calcium 02/14/2022 8.7 (L)  8.9 - 10.3 mg/dL Final   GFR, Estimated 02/14/2022 >60  >60 mL/min Final   Comment: (NOTE) Calculated using the CKD-EPI Creatinine Equation (2021)    Anion gap 02/14/2022 5  5 - 15 Final   Performed at [facility], [address] [place], [place] [address]    ECG: Date: 02/14/2022 Time ECG obtained: 1025 AM Rate: 72 bpm Rhythm: normal sinus Axis (leads I and aVF): Normal Intervals: PR 152 ms. QRS 88 ms. QTc 427 ms. ST segment and T wave changes: No evidence of acute ST segment elevation or depression Comparison: Similar to previous tracing obtained on 07/19/2021   IMAGING / PROCEDURES: TRANSTHORACIC ECHOCARDIOGRAM performed on 03/20/2022 Normal left ventricular systolic function with an EF of >55% Normal regional wall motion Left ventricular GLS -16.7% Left atrium mildly enlarged; indexed to 35 mL/m Right ventricular size and function normal  Trivial mitral, tricuspid, and pulmonary valve regurgitation Normal gradients; no valvular stenosis No pericardial effusion  LONG TERM CARDIAC EVENT MONITOR STUDY  performed on 02/25/2022 The predominant rhythm was sinus.  Maximum Heart Rate recorded was 178 bpm, 09/03 01:30:22 Minimum Heart Rate recorded was 49 bpm, 09/04 08:12:46 Average Heart Rate was 95 bpm.  There were 12 VE beats with a burden of <1 %.  There were  5 Patient Triggers.   Interpretation: Normal sinus rhythm  Rare ventricular and supraventricular ectopy  No ventricular or supraventricular tachycardia  No sinus pauses, sinus arrest, or high degree AV block  Patient triggered events reviewed:     PELVIC ULTRASOUND PROTOCOL TRANSABDOMINAL AND TRANSVAGINAL COMPLETE performed on 10/30/2021 Single intramural uterine mass, presumably a fibroid.  Right ovary not visualized.   MYOCARDIAL PERFUSION IMAGING STUDY (LEXISCAN) performed on 11/09/2015 Moderately reduced left ventricular systolic function with an LVEF of 44%  Normal myocardial thickening and wall motion Left ventricular cavity size normal SPECT images demonstrate homogenous tracer distribution throughout the myocardium No evidence of stress-induced myocardial ischemia or arrhythmia Normal low risk study  Impression and Plan:  Tracey White has been referred for pre-anesthesia review and clearance prior to her undergoing the planned anesthetic and procedural courses. Available labs, pertinent testing, and imaging results were personally reviewed by me in preparation for upcoming operative/procedural course. [facility] medical record has been updated following extensive record review and patient interview with PAT staff.   This patient has been appropriately cleared by cardiology with an overall MODERATE risk of significant perioperative cardiovascular complications. Based on clinical review performed today (04/07/22), barring any significant acute changes in the patient's overall condition, it is anticipated that she will be able to proceed with the planned surgical intervention. Any acute changes in clinical condition may  necessitate her procedure being postponed and/or cancelled. Patient will meet with anesthesia team (MD and/or CRNA) on the day of her procedure for preoperative evaluation/assessment. Questions regarding anesthetic course will be fielded at that time.   Pre-surgical instructions were reviewed with the patient during her PAT appointment, and questions were fielded to satisfaction by PAT clinical staff. She has been instructed on which medications that she will need to hold prior to surgery, as well as the ones that have been deemed safe/appropriate to take of the day  of her procedure. As part of the general education provided by PAT, patient made aware both verbally and in writing, that she would need to abstain from the use of any illegal substances during her perioperative course.  She was advised that failure to follow the provided instructions could necessitate case cancellation or result serious perioperative complications up to and including death. Patient encouraged to contact PAT and/or her surgeon's office to discuss any questions or concerns that may arise prior to surgery; verbalized understanding.   Honor Loh, MSN, APRN, FNP-C, CEN [facility]  Peri-operative Services Nurse Practitioner Phone: [phone] Fax: [phone] 04/07/22 12:21 AM  NOTE: This note has been prepared using Dragon dictation software. Despite my best ability to proofread, there is always the potential that unintentional transcriptional errors may still occur from this process.

## 2022-04-07 ENCOUNTER — Encounter: Payer: Self-pay | Admitting: Obstetrics and Gynecology

## 2022-04-07 NOTE — Progress Notes (Incomplete)
Perioperative / Anesthesia Services  Pre-Admission Testing Clinical Review / Preoperative Anesthesia Consult  Date: 04/05/22  Patient Demographics:  Name: Tracey White DOB:   05-05-1980 MRN:   [id number]  Planned Surgical Procedure(s):    Case: [id number] Date/Time: 04/11/22 0715   Procedures:      FRACTIONAL DILATATION & CURETTAGE/HYSTEROSCOPY WITH MYOSURE     INTRAUTERINE DEVICE (IUD) INSERTION - MIRENA   Anesthesia type: Choice   Pre-op diagnosis: menorrhagia, endometrial polyps   Location: [facility] OR ROOM 05 / [place] [facility]   Surgeons: Schermerhorn, Gwen Her, MD     NOTE: Available PAT nursing documentation and vital signs have been reviewed. Clinical nursing staff has updated patient's PMH/PSHx, current medication list, and drug allergies/intolerances to ensure comprehensive history available to assist in medical decision making as it pertains to the aforementioned surgical procedure and anticipated anesthetic course. Extensive review of available clinical information personally performed. [place] PMH and PSHx updated with any diagnoses/procedures that  may have been inadvertently omitted during her intake with the pre-admission testing department's nursing staff.  Clinical Discussion:  Tracey White is a 42 y.o. female who is submitted for pre-surgical anesthesia review and clearance prior to her undergoing the above procedure. Patient is a Current Smoker (10 pack years). Pertinent PMH includes: CHF, sinus tachycardia, prediabetes, DOE, OSAH (no longer requires nocturnal PAP therapy), asthma, GERD (on daily PPI), gastritis, microcytic anemia, menorrhagia, endometrial polyp, lymphedema, chronic lower back pain, chronic pain syndrome, scoliosis, anxiety, depression.  Patient is followed by cardiology Zigmund Daniel, MD). She was last seen in the cardiology clinic on 03/06/2023; notes reviewed. At the time of her clinic visit, patient doing well overall from a cardiovascular  perspective. Patient reported some intermittent episodes of chest "heaviness" associated with her anxiety. Patient also with ongoing exertional dyspnea, however this symptom reported to be stable and at baseline. Patient denied any chest pain, PND, orthopnea, palpitations, significant peripheral edema, weakness, fatigue, vertiginous symptoms, or presyncope/syncope. Patient with a past medical history significant for cardiovascular diagnoses. Documented physical exam was grossly benign, providing no evidence of acute exacerbation and/or decompensation of the patient's known cardiovascular conditions.  ***  Blood pressure***controlled at *** mmHg on currently prescribed*** therapies.  Patient is on***for her HLD diagnosis and ASCVD prevention. Patient is not diabetic/T2DM***controlled on currently prescribed regimen; last HgbA1c was***when checked on***. She does not have an OSAH diagnosis. ***Functional capacity, as defined by DASI, is documented as being >/= 4 METS. ***Functional capacity limited by***, however with that being said, patient still felt to be able to achieve at least 4 METS of physical activity without experiencing any degree of angina/anginal equivalent symptoms. No changes were made to her medication regimen during her visit with cardiology.  Patient scheduled to follow-up with outpatient cardiology in***months or sooner if needed.  Tracey White is scheduled for an [place]; INTRAUTERINE DEVICE (IUD) INSERTION - MIRENA on 04/11/2022 with Dr. Laverta Baltimore, MD.  Given patient's past medical history significant for cardiovascular diagnoses, presurgical cardiac clearance was sought by the PAT team. Per cardiology, "this patient is optimized for surgery and may proceed with the planned procedural course with a MODERATE risk of significant perioperative cardiovascular complications".   In review of her medication reconciliation, the patient  is not noted to be taking any type of anticoagulation or antiplatelet therapies that would need to be held during her perioperative course.  Patient denies previous perioperative complications with anesthesia in the past.  Patient  reports that she has previously been advised that she was a (+) difficult intubation. Patient presented to [facility] for tubal ligation, however due to morbid obesity, patient had to be transferred to [facility] for procedure. She has since undergone sleeve gastrectomy, resulting in a significant weight loss. Patient reports that she has been intubated without difficulties since her weight loss. In review of the available records, it is noted that patient underwent a general anesthetic course at [facility] (ASA III) in 12/2018 without documented complications.      04/03/2022    1:00 PM 03/07/2022    9:09 AM 02/13/2022   10:00 AM  Vitals with BMI  Height 5' 1.5" '5\' 1"'$  5' 1.5"  Weight 259 lbs 1 oz 249 lbs 257 lbs 15 oz  BMI 48.16 [id number]  Systolic  [id number]   Diastolic  70   Pulse  78     Providers/Specialists:   NOTE: Primary physician provider listed below. Patient may have been seen by APP or partner within same practice.   PROVIDER ROLE / SPECIALTY LAST Cannon Kettle, MD OB/GYN (Surgeon) 04/01/2022  Hilton Sinclair, [place] Primary Care Provider 12/12/2021  Kenard Gower, MD Cardiology 03/05/2022  Gillis Santa, MD Pain Management 03/07/2022  Girtha Hake, MD Physiatry 01/02/2022   Allergies:  Patient has no known allergies.  Current Home Medications:   No current facility-administered medications for this encounter.   Marland Kitchen acetaminophen (TYLENOL) 650 MG CR tablet  . Aspirin-Caffeine 845-65 MG PACK  . bumetanide (BUMEX) 1 MG tablet  . buPROPion (WELLBUTRIN SR) 150 MG 12 hr tablet  . busPIRone (BUSPAR) 7.5 MG tablet  . fluticasone (FLONASE) 50 MCG/ACT nasal spray  . HYDROXYZINE HCL PO  . naltrexone (DEPADE) 50 MG  tablet  . omeprazole (PRILOSEC OTC) 20 MG tablet  . potassium chloride SA (KLOR-CON M) 20 MEQ tablet  . solifenacin (VESICARE) 5 MG tablet   . technetium tetrofosmin (TC-MYOVIEW) injection [id number] millicurie   History:   Past Medical History:  Diagnosis Date  . Anxiety   . Asthma   . B12 deficiency   . CHF (congestive heart failure) ([place])   . Chronic low back pain with sciatica   . Chronic pain syndrome   . Depression   . Difficult intubation    x 1 with Tubal ligation-came here to [facility] for tubal and anesthesia could not get her intubated-Was sent to [facility] for surgery-Has had bariatric surgery since with no issue  . DOE (dyspnea on exertion)   . Endometrial polyp   . Gastritis   . GERD (gastroesophageal reflux disease)   . Hypokalemia   . Lumbosacral facet joint syndrome   . Lymphedema   . Major depressive disorder   . Menorrhagia   . Microcytic anemia   . Morbid obesity ([place])   . 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 OAB (overactive bladder)   . OSA (obstructive sleep apnea)    a.) no longer requires nocturnal PAP therapy following significant weight loss associated with sleeve gastrectomy  . Peripheral edema   . Positive ANA (antinuclear antibody)   . Pre-diabetes   . Scoliosis   . Sinus tachycardia   . Status post laparoscopic sleeve gastrectomy   . Vasovagal syncope   . Vitamin D deficiency    Past Surgical History:  Procedure Laterality Date  . HERNIA REPAIR    . [place] RESECTION N/A 2021  . scoliosis repair  x2  . TUBAL LIGATION     Family History  Problem Relation Age of Onset  . Diabetes Mother   . Hypertension Mother   . Cancer Maternal Grandmother    Social History   Tobacco Use  . Smoking status: Every Day    Packs/day: 0.50    Years: 20.00    Total pack years: 10.00    Types: Cigarettes  . Smokeless tobacco: Never  Vaping Use  . Vaping Use: Never used  Substance Use  Topics  . Alcohol use: Yes    Alcohol/week: 1.0 - 2.0 standard drink of alcohol    Types: 1 - 2 Standard drinks or equivalent per week    Comment: occasionally  . Drug use: No    Pertinent Clinical Results:  LABS:   No visits with results within 3 Day(s) from this visit.  Latest known visit with results is:  Hospital Outpatient Visit on 02/14/2022  Component Date Value Ref Range Status  . WBC 02/14/2022 6.5  4.0 - 10.5 K/uL Final  . RBC 02/14/2022 4.24  3.87 - 5.11 MIL/uL Final  . Hemoglobin 02/14/2022 10.0 (L)  12.0 - 15.0 g/dL Final  . HCT 02/14/2022 33.5 (L)  36.0 - 46.0 % Final  . MCV 02/14/2022 79.0 (L)  80.0 - 100.0 fL Final  . MCH 02/14/2022 23.6 (L)  26.0 - 34.0 pg Final  . MCHC 02/14/2022 29.9 (L)  30.0 - 36.0 g/dL Final  . RDW 02/14/2022 16.6 (H)  11.5 - 15.5 % Final  . Platelets 02/14/2022 325  150 - 400 K/uL Final  . nRBC 02/14/2022 0.0  0.0 - 0.2 % Final   Performed at [facility], [address], [place], [place] [address]  . ABO/RH(D) 02/14/2022 A POS   Final  . Antibody Screen 02/14/2022 NEG   Final  . Sample Expiration 02/14/2022 02/28/2022,2359   Final  . Extend sample reason 02/14/2022    Final                   Value:NO TRANSFUSIONS OR PREGNANCY IN THE PAST 3 MONTHS Performed at [facility], [address] [place], [place] [address]   . Sodium 02/14/2022 137  135 - 145 mmol/L Final  . Potassium 02/14/2022 3.4 (L)  3.5 - 5.1 mmol/L Final  . Chloride 02/14/2022 105  98 - 111 mmol/L Final  . CO2 02/14/2022 27  22 - 32 mmol/L Final  . Glucose, Bld 02/14/2022 75  70 - 99 mg/dL Final   Glucose reference range applies only to samples taken after fasting for at least 8 hours.  . BUN 02/14/2022 16  6 - 20 mg/dL Final  . Creatinine, Ser 02/14/2022 0.73  0.44 - 1.00 mg/dL Final  . Calcium 02/14/2022 8.7 (L)  8.9 - 10.3 mg/dL Final  . GFR, Estimated 02/14/2022 >60  >60 mL/min Final   Comment: (NOTE) Calculated using the CKD-EPI Creatinine  Equation (2021)   . Anion gap 02/14/2022 5  5 - 15 Final   Performed at [facility], [address] [place], [place] [address]    ECG: Date: 02/14/2022 Time ECG obtained: 1025 AM Rate: 72 bpm Rhythm: normal sinus Axis (leads I and aVF): Normal Intervals: PR 152 ms. QRS 88 ms. QTc 427 ms. ST segment and T wave changes: No evidence of acute ST segment elevation or depression Comparison: Similar to previous tracing obtained on 07/19/2021   IMAGING / PROCEDURES: TRANSTHORACIC ECHOCARDIOGRAM performed on 03/20/2022 Normal left ventricular  systolic function with an EF of >55% Normal regional wall motion Left ventricular GLS -16.7% Left atrium mildly enlarged; indexed to 35 mL/m Right ventricular size and function normal  Trivial mitral, tricuspid, and pulmonary valve regurgitation Normal gradients; no valvular stenosis No pericardial effusion  LONG TERM CARDIAC EVENT MONITOR STUDY performed on 02/25/2022 The predominant rhythm was sinus.  Maximum Heart Rate recorded was 178 bpm, 09/03 01:30:22 Minimum Heart Rate recorded was 49 bpm, 09/04 08:12:46 Average Heart Rate was 95 bpm.  There were 12 VE beats with a burden of <1 %.  There were  5 Patient Triggers.   Interpretation: Normal sinus rhythm  Rare ventricular and supraventricular ectopy  No ventricular or supraventricular tachycardia  No sinus pauses, sinus arrest, or high degree AV block  Patient triggered events reviewed:     PELVIC ULTRASOUND PROTOCOL TRANSABDOMINAL AND TRANSVAGINAL COMPLETE performed on 10/30/2021 Single intramural uterine mass, presumably a fibroid.  Right ovary not visualized.   MYOCARDIAL PERFUSION IMAGING STUDY (LEXISCAN) performed on 11/09/2015 Moderately reduced left ventricular systolic function with an LVEF of 44%  Normal myocardial thickening and wall motion Left ventricular cavity size normal SPECT images demonstrate homogenous tracer distribution throughout the myocardium No  evidence of stress-induced myocardial ischemia or arrhythmia Normal low risk study  Impression and Plan:  Tracey White has been referred for pre-anesthesia review and clearance prior to her undergoing the planned anesthetic and procedural courses. Available labs, pertinent testing, and imaging results were personally reviewed by me in preparation for upcoming operative/procedural course. [facility] medical record has been updated following extensive record review and patient interview with PAT staff.   This patient has been appropriately cleared by cardiology with an overall MODERATE risk of significant perioperative cardiovascular complications. Based on clinical review performed today (04/05/22), barring any significant acute changes in the patient's overall condition, it is anticipated that she will be able to proceed with the planned surgical intervention. Any acute changes in clinical condition may necessitate her procedure being postponed and/or cancelled. Patient will meet with anesthesia team (MD and/or CRNA) on the day of her procedure for preoperative evaluation/assessment. Questions regarding anesthetic course will be fielded at that time.   Pre-surgical instructions were reviewed with the patient during her PAT appointment, and questions were fielded to satisfaction by PAT clinical staff. She has been instructed on which medications that she will need to hold prior to surgery, as well as the ones that have been deemed safe/appropriate to take of the day of her procedure. As part of the general education provided by PAT, patient made aware both verbally and in writing, that she would need to abstain from the use of any illegal substances during her perioperative course.  She was advised that failure to follow the provided instructions could necessitate case cancellation or result serious perioperative complications up to and including death. Patient encouraged to contact PAT and/or her surgeon's  office to discuss any questions or concerns that may arise prior to surgery; verbalized understanding.   Honor Loh, MSN, APRN, FNP-C, CEN [facility]  Peri-operative Services Nurse Practitioner Phone: [phone] Fax: [phone] 04/05/22 10:38 AM  NOTE: This note has been prepared using Dragon dictation software. Despite my best ability to proofread, there is always the potential that unintentional transcriptional errors may still occur from this process.

## 2022-04-08 ENCOUNTER — Encounter
Admission: RE | Admit: 2022-04-08 | Discharge: 2022-04-08 | Disposition: A | Discharge status: Home / Self Care | Payer: 59 | Source: Ambulatory Visit | Attending: Obstetrics and Gynecology | Admitting: Obstetrics and Gynecology

## 2022-04-08 DIAGNOSIS — Z01818 Encounter for other preprocedural examination: Secondary | ICD-10-CM

## 2022-04-08 DIAGNOSIS — Z01812 Encounter for preprocedural laboratory examination: Secondary | ICD-10-CM | POA: Insufficient documentation

## 2022-04-08 LAB — TYPE AND SCREEN
ABO/RH(D): A POS
Antibody Screen: NEGATIVE

## 2022-04-08 LAB — CBC
HCT: 31.2 % — ABNORMAL LOW (ref 36.0–46.0)
Hemoglobin: 9.3 g/dL — ABNORMAL LOW (ref 12.0–15.0)
MCH: 22.7 pg — ABNORMAL LOW (ref 26.0–34.0)
MCHC: 29.8 g/dL — ABNORMAL LOW (ref 30.0–36.0)
MCV: 76.1 fL — ABNORMAL LOW (ref 80.0–100.0)
Platelets: 252 10*3/uL (ref 150–400)
RBC: 4.1 MIL/uL (ref 3.87–5.11)
RDW: 15.4 % (ref 11.5–15.5)
WBC: 6.8 10*3/uL (ref 4.0–10.5)
nRBC: 0 % (ref 0.0–0.2)

## 2022-04-08 LAB — BASIC METABOLIC PANEL
Anion gap: 6 (ref 5–15)
BUN: 14 mg/dL (ref 6–20)
CO2: 23 mmol/L (ref 22–32)
Calcium: 8.7 mg/dL — ABNORMAL LOW (ref 8.9–10.3)
Chloride: 108 mmol/L (ref 98–111)
Creatinine, Ser: 0.72 mg/dL (ref 0.44–1.00)
GFR, Estimated: >60 mL/min (ref >60)
Glucose, Bld: 93 mg/dL (ref 70–99)
Potassium: 4 mmol/L (ref 3.5–5.1)
Sodium: 137 mmol/L (ref 135–145)

## 2022-04-10 MED ORDER — ORAL CARE MOUTH RINSE: 15.0000 mL | Freq: Once | OROMUCOSAL | Status: AC

## 2022-04-10 MED ORDER — LACTATED RINGERS IV SOLN: INTRAVENOUS | Status: DC

## 2022-04-10 MED ORDER — ACETAMINOPHEN 500 MG PO TABS: 1000.0000 mg | ORAL_TABLET | ORAL | Status: AC

## 2022-04-10 MED ORDER — POVIDONE-IODINE 10 % EX SWAB: 2.0000 | Freq: Once | CUTANEOUS | Status: DC

## 2022-04-10 MED ORDER — CHLORHEXIDINE GLUCONATE 0.12 % MT SOLN: 15.0000 mL | Freq: Once | OROMUCOSAL | Status: AC

## 2022-04-10 MED ORDER — CEFAZOLIN SODIUM-DEXTROSE 2-4 GM/100ML-% IV SOLN
2.0000 g | Freq: Once | INTRAVENOUS | Status: AC
  Administered 2022-04-11: 2 g via INTRAVENOUS

## 2022-04-10 MED ORDER — GABAPENTIN 300 MG PO CAPS: 300.0000 mg | ORAL_CAPSULE | ORAL | Status: AC

## 2022-04-11 ENCOUNTER — Encounter: Payer: Self-pay | Admitting: Obstetrics and Gynecology

## 2022-04-11 ENCOUNTER — Ambulatory Visit: Payer: 59 | Admitting: Urgent Care

## 2022-04-11 ENCOUNTER — Other Ambulatory Visit: Payer: Self-pay

## 2022-04-11 ENCOUNTER — Encounter
Admission: RE | Disposition: A | Discharge status: Home / Self Care | Payer: Self-pay | Source: Ambulatory Visit | Attending: Obstetrics and Gynecology

## 2022-04-11 ENCOUNTER — Ambulatory Visit
Admission: RE | Admit: 2022-04-11 | Discharge: 2022-04-11 | Disposition: A | Discharge status: Home / Self Care | Payer: 59 | Source: Ambulatory Visit | Attending: Obstetrics and Gynecology | Admitting: Obstetrics and Gynecology

## 2022-04-11 DIAGNOSIS — D649 Anemia, unspecified: Secondary | ICD-10-CM | POA: Diagnosis not present

## 2022-04-11 DIAGNOSIS — N840 Polyp of corpus uteri: Secondary | ICD-10-CM | POA: Diagnosis not present

## 2022-04-11 DIAGNOSIS — Z30430 Encounter for insertion of intrauterine contraceptive device: Secondary | ICD-10-CM | POA: Diagnosis not present

## 2022-04-11 DIAGNOSIS — N920 Excessive and frequent menstruation with regular cycle: Secondary | ICD-10-CM | POA: Diagnosis present

## 2022-04-11 DIAGNOSIS — Z01818 Encounter for other preprocedural examination: Secondary | ICD-10-CM

## 2022-04-11 DIAGNOSIS — R9389 Abnormal findings on diagnostic imaging of other specified body structures: Secondary | ICD-10-CM | POA: Insufficient documentation

## 2022-04-11 DIAGNOSIS — Z6841 Body Mass Index (BMI) 40.0 and over, adult: Secondary | ICD-10-CM | POA: Diagnosis not present

## 2022-04-11 HISTORY — PX: DILATATION & CURETTAGE/HYSTEROSCOPY WITH MYOSURE: SHX6511

## 2022-04-11 HISTORY — PX: INTRAUTERINE DEVICE (IUD) INSERTION: SHX5877

## 2022-04-11 LAB — POCT PREGNANCY, URINE: Preg Test, Ur: NEGATIVE

## 2022-04-11 SURGERY — DILATATION & CURETTAGE/HYSTEROSCOPY WITH MYOSURE
Anesthesia: General | Site: Vagina

## 2022-04-11 MED ORDER — MIDAZOLAM HCL 2 MG/2ML IJ SOLN
INTRAMUSCULAR | Status: AC
  Filled 2022-04-11: qty 2

## 2022-04-11 MED ORDER — FENTANYL CITRATE (PF) 100 MCG/2ML IJ SOLN
INTRAMUSCULAR | Status: AC
  Filled 2022-04-11: qty 2

## 2022-04-11 MED ORDER — FENTANYL CITRATE (PF) 100 MCG/2ML IJ SOLN
INTRAMUSCULAR | Status: DC | PRN
  Administered 2022-04-11: 50 ug via INTRAVENOUS

## 2022-04-11 MED ORDER — LIDOCAINE HCL (CARDIAC) PF 100 MG/5ML IV SOSY
PREFILLED_SYRINGE | INTRAVENOUS | Status: DC | PRN
  Administered 2022-04-11: 100 mg via INTRAVENOUS

## 2022-04-11 MED ORDER — CEFAZOLIN SODIUM-DEXTROSE 2-4 GM/100ML-% IV SOLN
INTRAVENOUS | Status: AC
  Filled 2022-04-11: qty 100

## 2022-04-11 MED ORDER — MIDAZOLAM HCL 2 MG/2ML IJ SOLN
INTRAMUSCULAR | Status: DC | PRN
  Administered 2022-04-11: 2 mg via INTRAVENOUS

## 2022-04-11 MED ORDER — DEXAMETHASONE SODIUM PHOSPHATE 10 MG/ML IJ SOLN
INTRAMUSCULAR | Status: DC | PRN
  Administered 2022-04-11: 10 mg via INTRAVENOUS

## 2022-04-11 MED ORDER — OXYCODONE HCL 5 MG/5ML PO SOLN: 5.0000 mg | Freq: Once | ORAL | Status: AC | PRN

## 2022-04-11 MED ORDER — 0.9 % SODIUM CHLORIDE (POUR BTL) OPTIME
TOPICAL | Status: DC | PRN
  Administered 2022-04-11: 500 mL

## 2022-04-11 MED ORDER — PROPOFOL 10 MG/ML IV BOLUS
INTRAVENOUS | Status: AC
  Filled 2022-04-11: qty 20

## 2022-04-11 MED ORDER — ONDANSETRON HCL 4 MG/2ML IJ SOLN
INTRAMUSCULAR | Status: AC
  Filled 2022-04-11: qty 2

## 2022-04-11 MED ORDER — SUCCINYLCHOLINE CHLORIDE 200 MG/10ML IV SOSY
PREFILLED_SYRINGE | INTRAVENOUS | Status: AC
  Filled 2022-04-11: qty 10

## 2022-04-11 MED ORDER — PROPOFOL 10 MG/ML IV BOLUS
INTRAVENOUS | Status: DC | PRN
  Administered 2022-04-11: 200 mg via INTRAVENOUS

## 2022-04-11 MED ORDER — ACETAMINOPHEN 500 MG PO TABS
ORAL_TABLET | ORAL | Status: AC
  Administered 2022-04-11: 1000 mg via ORAL
  Filled 2022-04-11: qty 2

## 2022-04-11 MED ORDER — FENTANYL CITRATE (PF) 100 MCG/2ML IJ SOLN
25.0000 ug | INTRAMUSCULAR | Status: DC | PRN
  Administered 2022-04-11: 50 ug via INTRAVENOUS

## 2022-04-11 MED ORDER — PROMETHAZINE HCL 25 MG/ML IJ SOLN: 6.2500 mg | INTRAMUSCULAR | Status: DC | PRN

## 2022-04-11 MED ORDER — SUCCINYLCHOLINE CHLORIDE 200 MG/10ML IV SOSY
PREFILLED_SYRINGE | INTRAVENOUS | Status: DC | PRN
  Administered 2022-04-11: 120 mg via INTRAVENOUS

## 2022-04-11 MED ORDER — KETOROLAC TROMETHAMINE 30 MG/ML IJ SOLN
INTRAMUSCULAR | Status: DC | PRN
  Administered 2022-04-11: 30 mg via INTRAVENOUS

## 2022-04-11 MED ORDER — CHLORHEXIDINE GLUCONATE 0.12 % MT SOLN
OROMUCOSAL | Status: AC
  Administered 2022-04-11: 15 mL via OROMUCOSAL
  Filled 2022-04-11: qty 15

## 2022-04-11 MED ORDER — OXYCODONE HCL 5 MG PO TABS: 5.0000 mg | ORAL_TABLET | Freq: Once | ORAL | Status: AC | PRN

## 2022-04-11 MED ORDER — FERRIC SUBSULFATE 259 MG/GM EX SOLN
CUTANEOUS | Status: AC
  Filled 2022-04-11: qty 8

## 2022-04-11 MED ORDER — DROPERIDOL 2.5 MG/ML IJ SOLN: 0.6250 mg | Freq: Once | INTRAMUSCULAR | Status: DC | PRN

## 2022-04-11 MED ORDER — KETOROLAC TROMETHAMINE 30 MG/ML IJ SOLN
INTRAMUSCULAR | Status: AC
  Filled 2022-04-11: qty 1

## 2022-04-11 MED ORDER — SILVER NITRATE-POT NITRATE 75-25 % EX MISC
CUTANEOUS | Status: AC
  Filled 2022-04-11: qty 10

## 2022-04-11 MED ORDER — GABAPENTIN 300 MG PO CAPS
ORAL_CAPSULE | ORAL | Status: AC
  Administered 2022-04-11: 300 mg via ORAL
  Filled 2022-04-11: qty 1

## 2022-04-11 MED ORDER — LIDOCAINE HCL (PF) 2 % IJ SOLN
INTRAMUSCULAR | Status: AC
  Filled 2022-04-11: qty 5

## 2022-04-11 MED ORDER — DEXAMETHASONE SODIUM PHOSPHATE 10 MG/ML IJ SOLN
INTRAMUSCULAR | Status: AC
  Filled 2022-04-11: qty 1

## 2022-04-11 MED ORDER — LEVONORGESTREL 20 MCG/DAY IU IUD
INTRAUTERINE_SYSTEM | INTRAUTERINE | Status: AC
  Filled 2022-04-11: qty 1

## 2022-04-11 MED ORDER — LEVONORGESTREL 20 MCG/DAY IU IUD
1.0000 | INTRAUTERINE_SYSTEM | INTRAUTERINE | Status: AC
  Administered 2022-04-11: 1 via INTRAUTERINE

## 2022-04-11 MED ORDER — OXYCODONE HCL 5 MG PO TABS
ORAL_TABLET | ORAL | Status: AC
  Administered 2022-04-11: 5 mg via ORAL
  Filled 2022-04-11: qty 1

## 2022-04-11 MED ORDER — ONDANSETRON HCL 4 MG/2ML IJ SOLN
INTRAMUSCULAR | Status: DC | PRN
  Administered 2022-04-11: 4 mg via INTRAVENOUS

## 2022-04-11 SURGICAL SUPPLY — 28 items
APPLICATOR SWAB PROCTO LG 16IN (MISCELLANEOUS) ×2 IMPLANT
BAG PRESSURE INF REUSE 1000 (BAG) ×2 IMPLANT
DEVICE MYOSURE LITE (MISCELLANEOUS) IMPLANT
DEVICE MYOSURE REACH (MISCELLANEOUS) IMPLANT
DRSG TELFA 3X8 NADH STRL (GAUZE/BANDAGES/DRESSINGS) ×2 IMPLANT
GLOVE SURG SYN 8.0 (GLOVE) ×2 IMPLANT
GLOVE SURG SYN 8.0 PF PI (GLOVE) ×2 IMPLANT
GOWN STRL REUS W/ TWL LRG LVL3 (GOWN DISPOSABLE) ×2 IMPLANT
GOWN STRL REUS W/ TWL XL LVL3 (GOWN DISPOSABLE) ×2 IMPLANT
GOWN STRL REUS W/TWL LRG LVL3 (GOWN DISPOSABLE) ×2
GOWN STRL REUS W/TWL XL LVL3 (GOWN DISPOSABLE) ×2
IV NS 1000ML (IV SOLUTION)
IV NS 1000ML BAXH (IV SOLUTION) ×2 IMPLANT
IV NS IRRIG 3000ML ARTHROMATIC (IV SOLUTION) ×2 IMPLANT
KIT PROCEDURE FLUENT (KITS) IMPLANT
KIT TURNOVER CYSTO (KITS) ×2 IMPLANT
MANIFOLD NEPTUNE II (INSTRUMENTS) ×2 IMPLANT
Mirena IMPLANT
PACK DNC HYST (MISCELLANEOUS) ×2 IMPLANT
PAD OB MATERNITY 4.3X12.25 (PERSONAL CARE ITEMS) ×2 IMPLANT
PAD PREP 24X41 OB/GYN DISP (PERSONAL CARE ITEMS) ×2 IMPLANT
SCRUB CHG 4% DYNA-HEX 4OZ (MISCELLANEOUS) ×2 IMPLANT
SEAL ROD LENS SCOPE MYOSURE (ABLATOR) ×2 IMPLANT
SET CYSTO W/LG BORE CLAMP LF (SET/KITS/TRAYS/PACK) IMPLANT
TOWEL OR 17X26 4PK STRL BLUE (TOWEL DISPOSABLE) ×2 IMPLANT
TRAP FLUID SMOKE EVACUATOR (MISCELLANEOUS) ×2 IMPLANT
TUBING CONNECTING 10 (TUBING) IMPLANT
WATER STERILE IRR 500ML POUR (IV SOLUTION) ×2 IMPLANT

## 2022-04-11 NOTE — Anesthesia Procedure Notes (Signed)
Procedure Name: Intubation Date/Time: 04/11/2022 7:39 AM  Performed by: Nuri Larmer, [place], CRNAPre-anesthesia Checklist: Patient identified, Patient being monitored, Timeout performed, Emergency Drugs available and Suction available Patient Re-evaluated:Patient Re-evaluated prior to induction Oxygen Delivery Method: Circle system utilized Preoxygenation: Pre-oxygenation with 100% oxygen Induction Type: IV induction Ventilation: Mask ventilation without difficulty Laryngoscope Size: 3 and McGraph Grade View: Grade I Tube type: Oral Tube size: 7.0 mm Number of attempts: 1 Airway Equipment and Method: Stylet Placement Confirmation: ETT inserted through vocal cords under direct vision, positive ETCO2 and breath sounds checked- equal and bilateral Secured at: 22 cm Tube secured with: Tape Dental Injury: Teeth and Oropharynx as per pre-operative assessment

## 2022-04-11 NOTE — Anesthesia Postprocedure Evaluation (Signed)
Anesthesia Post Note  Patient: Tracey White  Procedure(s) Performed: FRACTIONAL DILATATION & CURETTAGE/HYSTEROSCOPY WITH MYOSURE (Vagina ) INTRAUTERINE DEVICE (IUD) INSERTION - MIRENA (Cervix)  Patient location during evaluation: PACU Anesthesia Type: General Level of consciousness: awake and alert Pain management: pain level controlled Vital Signs Assessment: post-procedure vital signs reviewed and stable Respiratory status: spontaneous breathing, nonlabored ventilation, respiratory function stable and patient connected to nasal cannula oxygen Cardiovascular status: blood pressure returned to baseline and stable Postop Assessment: no apparent nausea or vomiting Anesthetic complications: no  No notable events documented.   Last Vitals:  Vitals:   04/11/22 0857 04/11/22 0908  BP: 137/74 111/64  Pulse: 86 86  Resp: 10 17  Temp: (!) 36.2 C (!) 36.2 C  SpO2: 100% (!) 80%    Last Pain:  Vitals:   04/11/22 0908  TempSrc: Temporal  PainSc: 0-No pain                 Dimas Millin

## 2022-04-11 NOTE — Transfer of Care (Signed)
Immediate Anesthesia Transfer of Care Note  Patient: Tracey White  Procedure(s) Performed: Procedure(s): [place] (N/A) INTRAUTERINE DEVICE (IUD) INSERTION - MIRENA (N/A)  Patient Location: PACU  Anesthesia Type:General  Level of Consciousness: sedated  Airway & Oxygen Therapy: Patient Spontanous Breathing and Patient connected to face mask oxygen  Post-op Assessment: Report given to RN and Post -op Vital signs reviewed and stable  Post vital signs: Reviewed and stable  Last Vitals:  Vitals:   04/11/22 0629 04/11/22 0831  BP: 122/76 136/87  Pulse: 84 97  Resp: 16 12  Temp: (!) 36.1 C   SpO2: 123XX123 [id number]    Complications: No apparent anesthesia complications

## 2022-04-11 NOTE — Progress Notes (Signed)
Here for Fx D+C , H/S , possible myosure and placement of Mirena IUD  Labs reviewed . Neg HCG . All questions answered . Proceed

## 2022-04-11 NOTE — Op Note (Signed)
NAME: Tracey White, Tracey White. MEDICAL RECORD NO: [id number] ACCOUNT NO: [id number] DATE OF BIRTH: 02-08-1980 FACILITY: [facility] LOCATION: [facility] PHYSICIAN: Boykin Nearing, MD  Operative Report   DATE OF PROCEDURE: 04/11/2022  PREOPERATIVE DIAGNOSES:   1.  Menorrhagia. 2.  Anemia. 3.  Thickened endometrium.  POSTOPERATIVE DIAGNOSES:   1.  Menorrhagia. 2.  Anemia. 3.  Thickened endometrium.  PROCEDURE:   1.  Fractional dilation and curettage. 2.  Hysteroscopy. 3.  Mirena intrauterine device placement.  SURGEON:  Boykin Nearing, MD  ANESTHESIA:  General endotracheal anesthesia.  INDICATIONS:  A 42 year old female with a long history of menorrhagia.  Endometrial biopsy in the office revealed possible endometrial polyps.  The patient has morbid obesity.  DESCRIPTION OF PROCEDURE:  After adequate general endotracheal anesthesia, the patient was placed in dorsal supine position with the legs in the candy cane stirrups.  The patient's perineum and vagina were prepped and draped in normal sterile fashion.   The patient did receive 2 grams of IV Ancef prior to commencement of the procedure for surgical prophylaxis.  Timeout was performed.  Straight catheterization of the bladder yielded 75 mL clear urine.  A weighted speculum was placed in the posterior  vaginal vault and the anterior cervix was grasped with a single tooth tenaculum.  An endocervical curettage was performed followed by placement of the single tooth tenaculum on the posterior aspect of the cervix.  Cervix was then dilated to #17 Hanks  dilator.  The 5 mm hysteroscope was then advanced into the endometrial cavity and revealed a thickened endometrium without any definitive polyps.  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  Hysteroscope was removed and the cervix was then dilated  to #20 Hanks dilator and an endometrial curettage was performed with copious amount of endometrial tissue.  The Mirena IUD was then  brought up to the operative field.  The uterus sounded to 9 cm and the Mirena was placed according to manufacturer's  recommendations.  The Mirena IUD strings were cut at 3 cm.  Good hemostasis was noted.  There were no complications.  The patient did receive 30 mg intravenous Toradol at the end of the procedure.  INTRAOPERATIVE FLUIDS:  300 mL  URINE OUTPUT:  75 mL  ESTIMATED BLOOD LOSS:  5 mL  The patient tolerated the procedure well and was taken to recovery room in good condition.   VAI D: 04/11/2022 8:46:49 am T: 04/11/2022 8:59:00 am  JOB: [id number]

## 2022-04-11 NOTE — Discharge Instructions (Signed)

## 2022-04-11 NOTE — Brief Op Note (Signed)
04/11/2022  8:20 AM  PATIENT:  Tracey White  42 y.o. female  PRE-OPERATIVE DIAGNOSIS:  menorrhagia, endometrial polyps  POST-OPERATIVE DIAGNOSIS:  menorrhagia, thickened endometrium   PROCEDURE:   Fractional Dilation and Curettage  Hysteroscopy Mirena IUD placement   SURGEON:  Surgeon(s) and Role:    * Yuriana Gaal, Gwen Her, MD - Primary  PHYSICIAN ASSISTANT:   ASSISTANTS:CSt  ANESTHESIA:   general  EBL:  5 mL  IUF 300 cc UO 75 cc  BLOOD ADMINISTERED:none  DRAINS: none   LOCAL MEDICATIONS USED:  NONE  SPECIMEN:  Source of Specimen:  ecc, endometrial curettings   DISPOSITION OF SPECIMEN:  PATHOLOGY  COUNTS:  YES  TOURNIQUET:  * No tourniquets in log *  DICTATION: .Other Dictation: Dictation Number verbal  PLAN OF CARE: Discharge to home after PACU  PATIENT DISPOSITION:  PACU - hemodynamically stable.   Delay start of Pharmacological VTE agent (>24hrs) due to surgical blood loss or risk of bleeding: not applicable

## 2022-04-11 NOTE — Anesthesia Preprocedure Evaluation (Signed)
Anesthesia Evaluation  Patient identified by MRN, date of birth, ID band Patient awake    Reviewed: Allergy & Precautions, NPO status , Patient's Chart, lab work & pertinent test results  Airway Mallampati: III  TM Distance: >3 FB Neck ROM: full    Dental  (+) Chipped, Dental Advidsory Given   Pulmonary asthma , sleep apnea , Current Smoker and Patient abstained from smoking.   Pulmonary exam normal        Cardiovascular +CHF and + DOE  Normal cardiovascular exam     Neuro/Psych  PSYCHIATRIC DISORDERS      negative neurological ROS     GI/Hepatic Neg liver ROS,GERD  Medicated,,  Endo/Other  diabetes    Renal/GU      Musculoskeletal   Abdominal   Peds  Hematology  (+) Blood dyscrasia, anemia   Anesthesia Other Findings Past Medical History: No date: Anxiety No date: Asthma No date: B12 deficiency No date: CHF (congestive heart failure) ([place])     Comment:  a.) TTE 10/19/2015: EF 65-70%; b.) TTE 04/04/2017: EF               >55%; c.) TTE 08/14/2018: EF >55%, triv TR, RVSP 37; d.)               TTE 03/20/2022: EF >55%, mild LAE, triv MR/TR/PR No date: Chronic low back pain with sciatica No date: Chronic pain syndrome No date: Depression No date: Difficult intubation     Comment:  x 1 with Tubal ligation-came here to [facility] for tubal and               anesthesia could not get her intubated-Was sent to [facility]               for surgery-Has had bariatric surgery since with no issue No date: DOE (dyspnea on exertion) No date: Endometrial polyp No date: Gastritis No date: GERD (gastroesophageal reflux disease) No date: Hypokalemia No date: Lumbosacral facet joint syndrome No date: Lymphedema No date: Major depressive disorder No date: Menorrhagia No date: Microcytic anemia No date: Morbid obesity ([place]) No date: Non-radiographic axial spondyloarthritis of sacral and  sacrococcygeal region ([place]) No date: OAB  (overactive bladder) No date: OSA (obstructive sleep apnea)     Comment:  a.) no longer requires nocturnal PAP therapy following               significant weight loss associated with sleeve               gastrectomy No date: Peripheral edema No date: Positive ANA (antinuclear antibody) No date: Pre-diabetes No date: Scoliosis No date: Sinus tachycardia No date: Status post laparoscopic sleeve gastrectomy No date: Vasovagal syncope No date: Vitamin D deficiency  Past Surgical History: No date: HERNIA REPAIR 2021: LAPAROSCOPIC GASTRIC SLEEVE RESECTION; N/A No date: scoliosis repair     Comment:  x2 No date: TUBAL LIGATION  BMI    Body Mass Index: 48.15 kg/m      Reproductive/Obstetrics negative OB ROS                             Anesthesia Physical Anesthesia Plan  ASA: 3  Anesthesia Plan: General ETT   Post-op Pain Management:    Induction: Intravenous  PONV Risk Score and Plan: 3 and Ondansetron and Dexamethasone  Airway Management Planned: Oral ETT  Additional Equipment:   Intra-op Plan:   Post-operative Plan: Extubation in OR  Informed Consent: I have reviewed the patients History and Physical, chart, labs and discussed the procedure including the risks, benefits and alternatives for the proposed anesthesia with the patient or authorized representative who has indicated his/her understanding and acceptance.     Dental Advisory Given  Plan Discussed with: Anesthesiologist, CRNA and Surgeon  Anesthesia Plan Comments: (Patient consented for risks of anesthesia including but not limited to:  - adverse reactions to medications - damage to eyes, teeth, lips or other oral mucosa - nerve damage due to positioning  - sore throat or hoarseness - Damage to heart, brain, nerves, lungs, other parts of body or loss of life  Patient voiced understanding.)       Anesthesia Quick Evaluation

## 2022-04-12 ENCOUNTER — Encounter: Payer: Self-pay | Admitting: Obstetrics and Gynecology

## 2022-04-12 LAB — SURGICAL PATHOLOGY

## 2022-07-11 ENCOUNTER — Encounter: Payer: Self-pay | Admitting: Urgent Care

## 2022-07-11 ENCOUNTER — Ambulatory Visit
Admission: EM | Admit: 2022-07-11 | Discharge: 2022-07-11 | Disposition: A | Discharge status: Home / Self Care | Payer: 59 | Attending: Physician Assistant | Admitting: Physician Assistant

## 2022-07-11 DIAGNOSIS — Z202 Contact with and (suspected) exposure to infections with a predominantly sexual mode of transmission: Secondary | ICD-10-CM | POA: Diagnosis present

## 2022-07-11 DIAGNOSIS — B9689 Other specified bacterial agents as the cause of diseases classified elsewhere: Secondary | ICD-10-CM | POA: Diagnosis present

## 2022-07-11 DIAGNOSIS — N760 Acute vaginitis: Secondary | ICD-10-CM | POA: Insufficient documentation

## 2022-07-11 DIAGNOSIS — A599 Trichomoniasis, unspecified: Secondary | ICD-10-CM | POA: Diagnosis present

## 2022-07-11 LAB — WET PREP, GENITAL
Sperm: NONE SEEN
WBC, Wet Prep HPF POC: >10 — AB (ref <10)
Yeast Wet Prep HPF POC: NONE SEEN

## 2022-07-11 MED ORDER — METRONIDAZOLE 500 MG PO TABS: 500.0000 mg | ORAL_TABLET | Freq: Two times a day (BID) | ORAL | 0 refills | Status: AC

## 2022-07-11 NOTE — ED Triage Notes (Signed)
Patient here today due to vaginal itching and odor X 1 week. She is currently on her menstrual cycle. She states that her taste bud have been off as well. Her partner tested positive for trich. He received his results 2 days ago.

## 2022-07-11 NOTE — ED Provider Notes (Signed)
[facility]    CSN: [id number] Arrival date & time: 07/11/22  1416      History   Chief Complaint Chief Complaint  Patient presents with   Exposure to STD    HPI Tracey White is a 42 y.o. female presenting for 1 week history of foul-smelling vaginal discharge.  Denies fever, fatigue, abdominal/pelvic pain, back pain, dysuria, frequency or urgency.  Reports her partner recently told her that he was positive for trichomonas.  She would like STI screening today.  HPI  Past Medical History:  Diagnosis Date   Anxiety    Asthma    B12 deficiency    CHF (congestive heart failure) (HCC)    a.) TTE 10/19/2015: EF 65-70%; b.) TTE 04/04/2017: EF >55%; c.) TTE 08/14/2018: EF >55%, triv TR, RVSP 37; d.) TTE 03/20/2022: EF >55%, mild LAE, triv MR/TR/PR   Chronic low back pain with sciatica    Chronic pain syndrome    Depression    Difficult intubation    x 1 with Tubal ligation-came here to [facility] for tubal and anesthesia could not get her intubated-Was sent to [facility] for surgery-Has had bariatric surgery since with no issue   DOE (dyspnea on exertion)    Endometrial polyp    Gastritis    GERD (gastroesophageal reflux disease)    Hypokalemia    Lumbosacral facet joint syndrome    Lymphedema    Major depressive disorder    Menorrhagia    Microcytic anemia    Morbid obesity (HCC)    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)    OAB (overactive bladder)    OSA (obstructive sleep apnea)    a.) no longer requires nocturnal PAP therapy following significant weight loss associated with sleeve gastrectomy   Peripheral edema    Positive ANA (antinuclear antibody)    Pre-diabetes    Scoliosis    Sinus tachycardia    Status post laparoscopic sleeve gastrectomy    Vasovagal syncope    Vitamin D deficiency     Patient Active Problem List   Diagnosis Date Noted   Chronic radicular lumbar pain 03/07/2022   DDD (degenerative disc disease),  lumbosacral 11/23/2019   Spondylosis without myelopathy or radiculopathy, lumbosacral region 11/23/2019   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) 11/11/2019   Bilateral sacroiliitis (HCC) 10/28/2019   Lumbar facet syndrome (Bilateral) (R>L) 07/14/2019   Chronic sacroiliac joint pain (Right) 07/14/2019   Chronic lower extremity pain (2ry area of Pain) (Right) 07/14/2019   Chronic knee pain (3ry area of Pain) (Right) 07/14/2019   Osteoarthritis of knees (Bilateral) 07/14/2019   Chronic hip pain (4th area of Pain) (Right) 07/14/2019   Prediabetes 07/13/2019   Elevated hemoglobin A1c 07/13/2019   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   Hypertensive heart disease 07/22/2017   Long term current use of opiate analgesic 06/02/2017   Goals of care, counseling/discussion 05/07/2017   AIDS (HCC) 05/07/2017   B12 deficiency 04/16/2017   Chronic diastolic congestive heart failure (HCC) 04/02/2017   Iron deficiency anemia 03/19/2017   Positive ANA (antinuclear antibody) 02/04/2017   Positive antinuclear antibody 02/04/2017   Neurogenic pain 01/08/2017   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   Pharmacologic therapy 01/08/2017   Problems influencing health status 01/08/2017   Chronic lower extremity pain (Bilateral) (R>L) 01/08/2017   Elevated C-reactive protein (CRP) 01/08/2017   Elevated sed rate 01/08/2017   Vitamin D deficiency 01/08/2017   Class 3 severe obesity due to  excess calories with serious comorbidity and body mass index (BMI) of 50.0 to 59.9 in adult [facility]) 01/08/2017   Acute diastolic heart failure (HCC) 01/01/2017   Anemia 12/25/2016   Asthma without status asthmaticus 12/24/2016   Chronic knee pain (Bilateral) (R>L) 12/12/2016   Chronic thoracic back pain (Fourth Area of Pain) (Bilateral) (R>L) 12/12/2016   Chronic pain syndrome 12/12/2016   Disorder of skeletal system 12/12/2016   Other long term (current) drug therapy  12/12/2016   Scoliosis 12/12/2016   Lymphedema 09/24/2016   Skin lesion of left leg 09/05/2016   Depression 08/27/2016   Chronic low back pain (1ry area of Pain) (Bilateral) (R>L) w/o sciatica 08/27/2016   Hypokalemia 06/06/2016   Chest pain 05/31/2016   Chronic diastolic heart failure (HCC) 05/15/2016   Obstructive sleep apnea 05/15/2016   Tobacco use 05/15/2016   Sinus tachycardia 10/31/2015   Chest pain at rest 10/18/2015    Past Surgical History:  Procedure Laterality Date   DILATATION & CURETTAGE/HYSTEROSCOPY WITH MYOSURE N/A 04/11/2022   Procedure: FRACTIONAL DILATATION & CURETTAGE/HYSTEROSCOPY WITH MYOSURE;  Surgeon: Schermerhorn, Ihor Austin, MD;  Location: [facility] ORS;  Service: Gynecology;  Laterality: N/A;   HERNIA REPAIR     INTRAUTERINE DEVICE (IUD) INSERTION N/A 04/11/2022   Procedure: INTRAUTERINE DEVICE (IUD) INSERTION - MIRENA;  Surgeon: Schermerhorn, Ihor Austin, MD;  Location: [facility] ORS;  Service: Gynecology;  Laterality: N/A;   LAPAROSCOPIC GASTRIC SLEEVE RESECTION N/A 2021   scoliosis repair     x2   TUBAL LIGATION      OB History   No obstetric history on file.      Home Medications    Prior to Admission medications   Medication Sig Start Date End Date Taking? Authorizing Provider  acetaminophen (TYLENOL) 650 MG CR tablet Take 650 mg by mouth every 8 (eight) hours as needed for pain.   Yes [provider]  bumetanide (BUMEX) 1 MG tablet Take 1 mg by mouth 2 (two) times daily.   Yes [provider]  buPROPion (WELLBUTRIN SR) 150 MG 12 hr tablet Take 150 mg by mouth 2 (two) times daily.   Yes [provider]  busPIRone (BUSPAR) 7.5 MG tablet Take 7.5 mg by mouth as needed.   Yes [provider]  HYDROXYZINE HCL PO Take 1 tablet by mouth at bedtime.   Yes [provider]  metroNIDAZOLE (FLAGYL) 500 MG tablet Take 1 tablet (500 mg total) by mouth 2 (two) times daily for 7 days. 07/11/22 07/18/22 Yes Eusebio Friendly B, PA-C   naltrexone (DEPADE) 50 MG tablet Take 50 mg by mouth at bedtime.   Yes [provider]  potassium chloride SA (KLOR-CON M) 20 MEQ tablet Take 20 mEq by mouth daily.   Yes [provider]  solifenacin (VESICARE) 5 MG tablet Take 5 mg by mouth every morning.   Yes [provider]  Aspirin-Caffeine 845-65 MG PACK Take 1 packet by mouth as needed.    [provider]  fluticasone (FLONASE) 50 MCG/ACT nasal spray Place 2 sprays into both nostrils as needed for allergies or rhinitis.    [provider]  omeprazole (PRILOSEC OTC) 20 MG tablet Take 20 mg by mouth as needed.    [provider]  ipratropium (ATROVENT) 0.06 % nasal spray Place 2 sprays into both nostrils 3 (three) times daily.   05/24/20  [provider]  spironolactone (ALDACTONE) 25 MG tablet Take 25 mg by mouth daily.  05/24/20  [provider]  traMADol (ULTRAM) 50 MG tablet Take 1 tablet (50 mg total) by mouth every 6 (six) hours as needed for severe pain. Must last 30 days 11/15/19 05/24/20  Delano Metz, MD    Family History Family History  Problem Relation Age of Onset   Diabetes Mother    Hypertension Mother    Cancer Maternal Grandmother     Social History Social History   Tobacco Use   Smoking status: Every Day    Packs/day: 0.50    Years: 20.00    Additional pack years: 0.00    Total pack years: 10.00    Types: Cigarettes   Smokeless tobacco: Never  Vaping Use   Vaping Use: Never used  Substance Use Topics   Alcohol use: Yes    Alcohol/week: 1.0 - 2.0 standard drink of alcohol    Types: 1 - 2 Standard drinks or equivalent per week    Comment: occasionally   Drug use: No     Allergies   Patient has no known allergies.   Review of Systems Review of Systems  Constitutional:  Negative for fatigue and fever.  Gastrointestinal:  Negative for abdominal pain.  Genitourinary:  Positive for vaginal discharge. Negative for dysuria,  flank pain, frequency, hematuria, urgency, vaginal bleeding and vaginal pain.  Musculoskeletal:  Negative for back pain.  Skin:  Negative for rash.     Physical Exam Triage Vital Signs ED Triage Vitals [07/11/22 1422]  Enc Vitals Group     BP 123/71     Pulse Rate 99     Resp 16     Temp 98.2 F (36.8 C)     Temp Source Oral     SpO2 98 %     Weight      Height      Head Circumference      Peak Flow      Pain Score      Pain Loc      Pain Edu?      Excl. in GC?    No data found.  Updated Vital Signs BP 123/71 (BP Location: Left Arm)   Pulse 99   Temp 98.2 F (36.8 C) (Oral)   Resp 16   Ht 5\' 1"  (1.549 m)   Wt 264 lb (119.7 kg)   LMP 07/11/2022 (Exact Date)   SpO2 98%   BMI 49.88 kg/m      Physical Exam Vitals and nursing note reviewed.  Constitutional:      General: She is not in acute distress.    Appearance: Normal appearance. She is not ill-appearing or toxic-appearing.  HENT:     Head: Normocephalic and atraumatic.  Eyes:     General: No scleral icterus.       Right eye: No discharge.        Left eye: No discharge.     Conjunctiva/sclera: Conjunctivae normal.  Cardiovascular:     Rate and Rhythm: Normal rate and regular rhythm.  Pulmonary:     Effort: Pulmonary effort is normal. No respiratory distress.  Musculoskeletal:     Cervical back: Neck supple.  Skin:    General: Skin is dry.  Neurological:     General: No focal deficit present.     Mental Status: She is alert. Mental status is at baseline.     Motor: No weakness.     Gait: Gait normal.  Psychiatric:        Mood and Affect: Mood normal.  Behavior: Behavior normal.        Thought Content: Thought content normal.      UC Treatments / Results  Labs (all labs ordered are listed, but only abnormal results are displayed) Labs Reviewed  WET PREP, GENITAL - Abnormal; Notable for the following components:      Result Value   Trich, Wet Prep PRESENT (*)    Clue Cells Wet Prep  HPF POC PRESENT (*)    WBC, Wet Prep HPF POC >10 (*)    All other components within normal limits  CYTOLOGY, (ORAL, ANAL, URETHRAL) ANCILLARY ONLY    EKG   Radiology No results found.  Procedures Procedures (including critical care time)  Medications Ordered in UC Medications - No data to display  Initial Impression / Assessment and Plan / UC Course  I have reviewed the triage vital signs and the nursing notes.  Pertinent labs & imaging results that were available during my care of the patient were reviewed by me and considered in my medical decision making (see chart for details).   42 year old female presents for foul-smelling vaginal discharge after being exposed to trichomonas.  Wet prep positive for trichomonas and clue cells.  Treating with metronidazole.  Discussed no sexual intercourse until a week after she finished treatment and her partner completed treatment as well.  She reports she is no longer with his partner.  Results of GC/chlamydia test pending.  Will add additional therapy if positive.  Safe sex advised.   Final Clinical Impressions(s) / UC Diagnoses   Final diagnoses:  Trichomoniasis  Bacterial vaginosis  Exposure to STD     Discharge Instructions      -You are positive for trichomonas and bacterial vaginosis.  I sent metronidazole which will treat both infections. - Results of the gonorrhea committee test will be back in the next couple days and we will call you if it is positive.  Results will come through MyChart.  The most common types of vaginal infections are yeast infections and bacterial vaginosis. Neither of which are really considered to be sexually transmitted. Often a pH swab or wet prep is performed and if abnormal may reveal either type of infection. Begin metronidazole if prescribed for possible BV infection. If there is concern for yeast infection, fluconazole is often prescribed . Take this as directed. You may also apply topical  miconazole (can be purchased OTC) externally for relief of itching. Increase rest and fluid intake. If labs sent out, we will call within 2-5 days with results and amend treatment if necessary. Always try to use pH balanced washes/wipes, urinate after intercourse, stay hydrated, and take probiotics if you are prone to vaginal infections. Return or see PCP or gynecologist for new/worsening infections.       ED Prescriptions     Medication Sig Dispense Auth. Provider   metroNIDAZOLE (FLAGYL) 500 MG tablet Take 1 tablet (500 mg total) by mouth 2 (two) times daily for 7 days. 14 tablet Gareth Morgan      PDMP not reviewed this encounter.   Shirlee Latch, PA-C 07/11/22 1500

## 2022-07-11 NOTE — Discharge Instructions (Addendum)
-  You are positive for trichomonas and bacterial vaginosis.  I sent metronidazole which will treat both infections. - Results of the gonorrhea committee test will be back in the next couple days and we will call you if it is positive.  Results will come through MyChart.  The most common types of vaginal infections are yeast infections and bacterial vaginosis. Neither of which are really considered to be sexually transmitted. Often a pH swab or wet prep is performed and if abnormal may reveal either type of infection. Begin metronidazole if prescribed for possible BV infection. If there is concern for yeast infection, fluconazole is often prescribed . Take this as directed. You may also apply topical miconazole (can be purchased OTC) externally for relief of itching. Increase rest and fluid intake. If labs sent out, we will call within 2-5 days with results and amend treatment if necessary. Always try to use pH balanced washes/wipes, urinate after intercourse, stay hydrated, and take probiotics if you are prone to vaginal infections. Return or see PCP or gynecologist for new/worsening infections.

## 2022-07-12 LAB — CYTOLOGY, (ORAL, ANAL, URETHRAL) ANCILLARY ONLY
Chlamydia: NEGATIVE
Comment: NEGATIVE
Comment: NEGATIVE
Comment: NORMAL
Neisseria Gonorrhea: NEGATIVE
Trichomonas: POSITIVE — AB

## 2022-07-15 ENCOUNTER — Ambulatory Visit
Admission: EM | Admit: 2022-07-15 | Discharge: 2022-07-15 | Disposition: A | Discharge status: Home / Self Care | Payer: 59 | Attending: Physician Assistant | Admitting: Physician Assistant

## 2022-07-15 DIAGNOSIS — M79602 Pain in left arm: Secondary | ICD-10-CM

## 2022-07-15 DIAGNOSIS — M5412 Radiculopathy, cervical region: Secondary | ICD-10-CM | POA: Diagnosis not present

## 2022-07-15 MED ORDER — KETOROLAC TROMETHAMINE 30 MG/ML IJ SOLN
30.0000 mg | Freq: Once | INTRAMUSCULAR | Status: AC
  Administered 2022-07-15: 30 mg via INTRAMUSCULAR

## 2022-07-15 MED ORDER — PREGABALIN 75 MG PO CAPS: 75.0000 mg | ORAL_CAPSULE | Freq: Two times a day (BID) | ORAL | 0 refills | Status: AC | PRN

## 2022-07-15 MED ORDER — PREDNISONE 10 MG PO TABS: ORAL_TABLET | ORAL | 0 refills | Status: DC

## 2022-07-15 NOTE — ED Triage Notes (Signed)
Pt c/o L arm pain x1 day. Denies any injuries, did lift a heavy bag of plates yesterday which caused sharp pain that radiated to neck area.

## 2022-07-15 NOTE — Discharge Instructions (Signed)
NECK PAIN: Stressed avoiding painful activities. This can exacerbate your symptoms and make them worse.  May apply heat to the areas of pain for some relief. Use medications as directed. Be aware of which medications make you drowsy and do not drive or operate any kind of heavy machinery while using the medication (ie pain medications or muscle relaxers). F/U with PCP for reexamination or ortho sooner if condition worsens or does not begin to improve over the next few days.   NECK PAIN RED FLAGS: If symptoms get worse than they are right now, you should come back sooner for re-evaluation. If you have increased numbness/ tingling or notice that the numbness/tingling is affecting the legs or saddle region, go to ER. If you ever lose continence go to ER.

## 2022-07-15 NOTE — ED Provider Notes (Signed)
[facility]    CSN: [id number] Arrival date & time: 07/15/22  1702      History   Chief Complaint Chief Complaint  Patient presents with   Arm Pain    HPI Tracey White is a 42 y.o. female presenting for atraumatic left arm pain intermittently for the past 2 months which is recently worsened over the past couple of days.  Patient states the pain radiates from her wrist all the way up into her neck.  She reports the pain will be relieved sometimes by pressing a specific spot in her wrist.  Nothing really seems to make the pain worse.  She states when she gets a sharp pain she has dropped things but denies any associated weakness.  No numbness.  Pain is worse in the arm than it is in the neck.  She has never had a problem with her neck before but she has had chronic back pain and disc problems.  She has taken over-the-counter NSAIDs without any relief.  She takes naltrexone to prevent alcohol use.  She has taken gabapentin in the past but states it caused her to have headaches.  HPI  Past Medical History:  Diagnosis Date   Anxiety    Asthma    B12 deficiency    CHF (congestive heart failure) (HCC)    a.) TTE 10/19/2015: EF 65-70%; b.) TTE 04/04/2017: EF >55%; c.) TTE 08/14/2018: EF >55%, triv TR, RVSP 37; d.) TTE 03/20/2022: EF >55%, mild LAE, triv MR/TR/PR   Chronic low back pain with sciatica    Chronic pain syndrome    Depression    Difficult intubation    x 1 with Tubal ligation-came here to [facility] for tubal and anesthesia could not get her intubated-Was sent to [facility] for surgery-Has had bariatric surgery since with no issue   DOE (dyspnea on exertion)    Endometrial polyp    Gastritis    GERD (gastroesophageal reflux disease)    Hypokalemia    Lumbosacral facet joint syndrome    Lymphedema    Major depressive disorder    Menorrhagia    Microcytic anemia    Morbid obesity (HCC)    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)     OAB (overactive bladder)    OSA (obstructive sleep apnea)    a.) no longer requires nocturnal PAP therapy following significant weight loss associated with sleeve gastrectomy   Peripheral edema    Positive ANA (antinuclear antibody)    Pre-diabetes    Scoliosis    Sinus tachycardia    Status post laparoscopic sleeve gastrectomy    Vasovagal syncope    Vitamin D deficiency     Patient Active Problem List   Diagnosis Date Noted   Chronic radicular lumbar pain 03/07/2022   DDD (degenerative disc disease), lumbosacral 11/23/2019   Spondylosis without myelopathy or radiculopathy, lumbosacral region 11/23/2019   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) 11/11/2019   Bilateral sacroiliitis (HCC) 10/28/2019   Lumbar facet syndrome (Bilateral) (R>L) 07/14/2019   Chronic sacroiliac joint pain (Right) 07/14/2019   Chronic lower extremity pain (2ry area of Pain) (Right) 07/14/2019   Chronic knee pain (3ry area of Pain) (Right) 07/14/2019   Osteoarthritis of knees (Bilateral) 07/14/2019   Chronic hip pain (4th area of Pain) (Right) 07/14/2019   Prediabetes 07/13/2019   Elevated hemoglobin A1c 07/13/2019   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   Hypertensive heart disease 07/22/2017   Long term current  use of opiate analgesic 06/02/2017   Goals of care, counseling/discussion 05/07/2017   AIDS (HCC) 05/07/2017   B12 deficiency 04/16/2017   Chronic diastolic congestive heart failure (HCC) 04/02/2017   Iron deficiency anemia 03/19/2017   Positive ANA (antinuclear antibody) 02/04/2017   Positive antinuclear antibody 02/04/2017   Neurogenic pain 01/08/2017   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   Pharmacologic therapy 01/08/2017   Problems influencing health status 01/08/2017   Chronic lower extremity pain (Bilateral) (R>L) 01/08/2017   Elevated C-reactive protein (CRP) 01/08/2017   Elevated sed rate 01/08/2017   Vitamin D deficiency  01/08/2017   Class 3 severe obesity due to excess calories with serious comorbidity and body mass index (BMI) of 50.0 to 59.9 in adult (HCC) 01/08/2017   Acute diastolic heart failure (HCC) 01/01/2017   Anemia 12/25/2016   Asthma without status asthmaticus 12/24/2016   Chronic knee pain (Bilateral) (R>L) 12/12/2016   Chronic thoracic back pain (Fourth Area of Pain) (Bilateral) (R>L) 12/12/2016   Chronic pain syndrome 12/12/2016   Disorder of skeletal system 12/12/2016   Other long term (current) drug therapy 12/12/2016   Scoliosis 12/12/2016   Lymphedema 09/24/2016   Skin lesion of left leg 09/05/2016   Depression 08/27/2016   Chronic low back pain (1ry area of Pain) (Bilateral) (R>L) w/o sciatica 08/27/2016   Hypokalemia 06/06/2016   Chest pain 05/31/2016   Chronic diastolic heart failure (HCC) 05/15/2016   Obstructive sleep apnea 05/15/2016   Tobacco use 05/15/2016   Sinus tachycardia 10/31/2015   Chest pain at rest 10/18/2015    Past Surgical History:  Procedure Laterality Date   DILATATION & CURETTAGE/HYSTEROSCOPY WITH MYOSURE N/A 04/11/2022   Procedure: FRACTIONAL DILATATION & CURETTAGE/HYSTEROSCOPY WITH MYOSURE;  Surgeon: Schermerhorn, Ihor Austin, MD;  Location: [facility];  Service: Gynecology;  Laterality: N/A;   HERNIA REPAIR     INTRAUTERINE DEVICE (IUD) INSERTION N/A 04/11/2022   Procedure: INTRAUTERINE DEVICE (IUD) INSERTION - MIRENA;  Surgeon: Schermerhorn, Ihor Austin, MD;  Location: [facility];  Service: Gynecology;  Laterality: N/A;   LAPAROSCOPIC GASTRIC SLEEVE RESECTION N/A 2021   scoliosis repair     x2   TUBAL LIGATION      OB History   No obstetric history on file.      Home Medications    Prior to Admission medications   Medication Sig Start Date End Date Taking? Authorizing Provider  acetaminophen (TYLENOL) 650 MG CR tablet Take 650 mg by mouth every 8 (eight) hours as needed for pain.   Yes [provider]  Aspirin-Caffeine 845-65 MG PACK Take 1  packet by mouth as needed.   Yes [provider]  bumetanide (BUMEX) 1 MG tablet Take 1 mg by mouth 2 (two) times daily.   Yes [provider]  buPROPion (WELLBUTRIN SR) 150 MG 12 hr tablet Take 150 mg by mouth 2 (two) times daily.   Yes [provider]  busPIRone (BUSPAR) 7.5 MG tablet Take 7.5 mg by mouth as needed.   Yes [provider]  fluticasone (FLONASE) 50 MCG/ACT nasal spray Place 2 sprays into both nostrils as needed for allergies or rhinitis.   Yes [provider]  HYDROXYZINE HCL PO Take 1 tablet by mouth at bedtime.   Yes [provider]  metroNIDAZOLE (FLAGYL) 500 MG tablet Take 1 tablet (500 mg total) by mouth 2 (two) times daily for 7 days. 07/11/22 07/18/22 Yes Eusebio Friendly B, PA-C  naltrexone (DEPADE) 50 MG tablet Take 50 mg by mouth at  bedtime.   Yes [provider]  omeprazole (PRILOSEC OTC) 20 MG tablet Take 20 mg by mouth as needed.   Yes [provider]  potassium chloride SA (KLOR-CON M) 20 MEQ tablet Take 20 mEq by mouth daily.   Yes [provider]  predniSONE (DELTASONE) 10 MG tablet Take 6 tabs p.o. on day 1 and decrease by 1 tablet daily until complete 07/15/22  Yes Eusebio Friendly B, PA-C  pregabalin (LYRICA) 75 MG capsule Take 1 capsule (75 mg total) by mouth 2 (two) times daily as needed for up to 15 days. 07/15/22 07/30/22 Yes Eusebio Friendly B, PA-C  solifenacin (VESICARE) 5 MG tablet Take 5 mg by mouth every morning.   Yes [provider]  ipratropium (ATROVENT) 0.06 % nasal spray Place 2 sprays into both nostrils 3 (three) times daily.   05/24/20  [provider]  spironolactone (ALDACTONE) 25 MG tablet Take 25 mg by mouth daily.  05/24/20  [provider]  traMADol (ULTRAM) 50 MG tablet Take 1 tablet (50 mg total) by mouth every 6 (six) hours as needed for severe pain. Must last 30 days 11/15/19 05/24/20  Delano Metz, MD    Family History Family History   Problem Relation Age of Onset   Diabetes Mother    Hypertension Mother    Cancer Maternal Grandmother     Social History Social History   Tobacco Use   Smoking status: Every Day    Packs/day: 0.50    Years: 20.00    Additional pack years: 0.00    Total pack years: 10.00    Types: Cigarettes   Smokeless tobacco: Never  Vaping Use   Vaping Use: Never used  Substance Use Topics   Alcohol use: Yes    Alcohol/week: 1.0 - 2.0 standard drink of alcohol    Types: 1 - 2 Standard drinks or equivalent per week    Comment: occasionally   Drug use: No     Allergies   Patient has no known allergies.   Review of Systems Review of Systems  Musculoskeletal:  Positive for arthralgias and neck pain. Negative for back pain and neck stiffness.  Neurological:  Negative for weakness, numbness and headaches.     Physical Exam Triage Vital Signs ED Triage Vitals  Enc Vitals Group     BP 07/15/22 1716 130/84     Pulse Rate 07/15/22 1716 80     Resp 07/15/22 1716 16     Temp 07/15/22 1716 98.8 F (37.1 C)     Temp Source 07/15/22 1716 Oral     SpO2 07/15/22 1716 97 %     Weight 07/15/22 1716 264 lb (119.7 kg)     Height 07/15/22 1716 5\' 1"  (1.549 m)     Head Circumference --      Peak Flow --      Pain Score 07/15/22 1719 10     Pain Loc --      Pain Edu? --      Excl. in GC? --    No data found.  Updated Vital Signs BP 130/84 (BP Location: Right Arm)   Pulse 80   Temp 98.8 F (37.1 C) (Oral)   Resp 16   Ht 5\' 1"  (1.549 m)   Wt 264 lb (119.7 kg)   LMP 07/11/2022 (Exact Date)   SpO2 97%   BMI 49.88 kg/m    Physical Exam Vitals and nursing note reviewed.  Constitutional:      General:  She is not in acute distress.    Appearance: Normal appearance. She is not ill-appearing or toxic-appearing.  HENT:     Head: Normocephalic and atraumatic.  Eyes:     General: No scleral icterus.       Right eye: No discharge.        Left eye: No discharge.      Conjunctiva/sclera: Conjunctivae normal.  Cardiovascular:     Rate and Rhythm: Normal rate and regular rhythm.     Heart sounds: Normal heart sounds.  Pulmonary:     Effort: Pulmonary effort is normal. No respiratory distress.     Breath sounds: Normal breath sounds.  Musculoskeletal:     Cervical back: Normal range of motion and neck supple. Tenderness (left paracervical muscles and trap) present. No pain with movement. Normal range of motion.     Comments: Left arm: There is tenderness to palpation of the left posterior shoulder, left deltoid, left biceps groove, left biceps and left forearm.  Skin:    General: Skin is dry.  Neurological:     General: No focal deficit present.     Mental Status: She is alert. Mental status is at baseline.     Motor: No weakness.     Gait: Gait normal.  Psychiatric:        Mood and Affect: Mood normal.        Behavior: Behavior normal.        Thought Content: Thought content normal.      UC Treatments / Results  Labs (all labs ordered are listed, but only abnormal results are displayed) Labs Reviewed - No data to display  EKG   Radiology No results found.  Procedures Procedures (including critical care time)  Medications Ordered in UC Medications  ketorolac (TORADOL) 30 MG/ML injection 30 mg (30 mg Intramuscular Given 07/15/22 1739)    Initial Impression / Assessment and Plan / UC Course  I have reviewed the triage vital signs and the nursing notes.  Pertinent labs & imaging results that were available during my care of the patient were reviewed by me and considered in my medical decision making (see chart for details).   42 year old female presents for left arm pain and neck pain for the past couple of months, worsening over the past couple days.  Presentation is consistent with cervical radiculopathy.  Patient given 30 mg IM ketorolac injection in clinic for acute relief of pain.  Placing patient on Lyrica as needed for severe pain  twice daily.  Reports history of headaches when taking gabapentin or I would have prescribed that first.  On naltrexone so will avoid any narcotics.  Starting patient on prednisone taper as well.  Reviewed supportive care, rest, Tylenol, heat, ice and follow-up with PCP or orthopedics especially if no improvement in the next couple weeks or symptoms worsen.  Work note given.   Final Clinical Impressions(s) / UC Diagnoses   Final diagnoses:  Left arm pain  Cervical radiculopathy     Discharge Instructions      NECK PAIN: Stressed avoiding painful activities. This can exacerbate your symptoms and make them worse.  May apply heat to the areas of pain for some relief. Use medications as directed. Be aware of which medications make you drowsy and do not drive or operate any kind of heavy machinery while using the medication (ie pain medications or muscle relaxers). F/U with PCP for reexamination or ortho sooner if condition worsens or does not begin to improve over  the next few days.   NECK PAIN RED FLAGS: If symptoms get worse than they are right now, you should come back sooner for re-evaluation. If you have increased numbness/ tingling or notice that the numbness/tingling is affecting the legs or saddle region, go to ER. If you ever lose continence go to ER.         ED Prescriptions     Medication Sig Dispense Auth. Provider   predniSONE (DELTASONE) 10 MG tablet Take 6 tabs p.o. on day 1 and decrease by 1 tablet daily until complete 21 tablet Eusebio Friendly B, PA-C   pregabalin (LYRICA) 75 MG capsule Take 1 capsule (75 mg total) by mouth 2 (two) times daily as needed for up to 15 days. 30 capsule Shirlee Latch, PA-C      I have reviewed the PDMP during this encounter.   Shirlee Latch, PA-C 07/15/22 1744

## 2023-05-04 ENCOUNTER — Emergency Department
Admission: EM | Admit: 2023-05-04 | Discharge: 2023-05-04 | Disposition: A | Discharge status: Home / Self Care | Attending: Emergency Medicine | Admitting: Emergency Medicine

## 2023-05-04 ENCOUNTER — Emergency Department

## 2023-05-04 ENCOUNTER — Other Ambulatory Visit: Payer: Self-pay

## 2023-05-04 DIAGNOSIS — W1830XA Fall on same level, unspecified, initial encounter: Secondary | ICD-10-CM | POA: Insufficient documentation

## 2023-05-04 DIAGNOSIS — S0990XA Unspecified injury of head, initial encounter: Secondary | ICD-10-CM

## 2023-05-04 DIAGNOSIS — I509 Heart failure, unspecified: Secondary | ICD-10-CM | POA: Insufficient documentation

## 2023-05-04 DIAGNOSIS — R519 Headache, unspecified: Secondary | ICD-10-CM | POA: Diagnosis present

## 2023-05-04 DIAGNOSIS — S0083XA Contusion of other part of head, initial encounter: Secondary | ICD-10-CM | POA: Insufficient documentation

## 2023-05-04 DIAGNOSIS — S060X0A Concussion without loss of consciousness, initial encounter: Secondary | ICD-10-CM

## 2023-05-04 MED ORDER — BUTALBITAL-APAP-CAFFEINE 50-325-40 MG PO TABS
2.0000 | ORAL_TABLET | Freq: Once | ORAL | Status: AC
  Administered 2023-05-04: 2 via ORAL
  Filled 2023-05-04: qty 2

## 2023-05-04 MED ORDER — BUTALBITAL-APAP-CAFFEINE 50-325-40 MG PO TABS: 1.0000 | ORAL_TABLET | Freq: Four times a day (QID) | ORAL | 0 refills | Status: AC | PRN

## 2023-05-04 NOTE — ED Provider Notes (Signed)
   [facility] Provider Note    Event Date/Time   First MD Initiated Contact with Patient 05/04/23 1503     (approximate)  History   Chief Complaint: Head Injury  HPI  Tracey White is a 44 y.o. female with a past medical history of anxiety, CHF, presents to the emergency department for headache.  According to the patient she states she was drinking alcohol last night she is not sure if she hit her head or what she hit her head on but she states this morning she noted a lump to her forehead and she has had a headache throughout the day today.  Patient denies any fever.  Patient was concerned given her continued headache so she came to the emergency department.  States that headache is worse with loud noises or light.  Physical Exam   Triage Vital Signs: ED Triage Vitals  Encounter Vitals Group     BP 05/04/23 1354 (!) 144/92     Systolic BP Percentile --      Diastolic BP Percentile --      Pulse Rate 05/04/23 1354 87     Resp 05/04/23 1354 18     Temp 05/04/23 1354 98 F (36.7 C)     Temp src --      SpO2 05/04/23 1354 98 %     Weight 05/04/23 1354 264 lb (119.7 kg)     Height 05/04/23 1354 5\' 1"  (1.549 m)     Head Circumference --      Peak Flow --      Pain Score 05/04/23 1352 8     Pain Loc --      Pain Education --      Exclude from Growth Chart --     Most recent vital signs: Vitals:   05/04/23 1354  BP: (!) 144/92  Pulse: 87  Resp: 18  Temp: 98 F (36.7 C)  SpO2: 98%    General: Awake, no distress.  CV:  Good peripheral perfusion.  Regular rate and rhythm  Resp:  Normal effort.  Equal breath sounds bilaterally.  Other:  Small hematoma to left forehead with mild tenderness to this area.  No depression noted.   ED Results / Procedures / Treatments   RADIOLOGY  I have reviewed and interpreted CT head images.  No bleed seen on my evaluation. Radiology is read the CT scan is negative for acute abnormality.  MEDICATIONS ORDERED IN  ED: Medications - No data to display   IMPRESSION / MDM / ASSESSMENT AND PLAN / ED COURSE  I reviewed the triage vital signs and the nursing notes.  Patient's presentation is most consistent with acute presentation with potential threat to life or bodily function.  Patient presents emergency department following a head injury last night with continued headache today.  Patient has tenderness to the forehead where she has a small hematoma.  Reassuringly patient CT scan of the head and C-spine are negative for acute abnormality.  We will dose Fioricet I discussed with the patient to try to get 6+ hours of sleep to see if this helps with her headache.  Patient will follow-up with her doctor.  Patient reassured by today's workup.  FINAL CLINICAL IMPRESSION(S) / ED DIAGNOSES   Head injury  Note:  This document was prepared using Dragon voice recognition software and may include unintentional dictation errors.   Minna Antis, MD 05/04/23 1521

## 2023-05-04 NOTE — ED Notes (Signed)
 See triage note  Presents s/p fall States she is not sure if she fell from a chair or the bed Landed on left side  Having left sided headache and shoulder pain  No deformity noted to shoulder  Small hematoma noted to forehead

## 2023-05-04 NOTE — ED Triage Notes (Signed)
 Pt comes with c/o fall last night. Pt states she hit her left side of face, head and all down her left side. Pt does have hematoma noted to left side forehead.

## 2023-05-13 ENCOUNTER — Ambulatory Visit: Admitting: Student

## 2023-08-30 ENCOUNTER — Ambulatory Visit
Admission: EM | Admit: 2023-08-30 | Discharge: 2023-08-30 | Disposition: A | Discharge status: Home / Self Care | Attending: Physician Assistant | Admitting: Physician Assistant

## 2023-08-30 DIAGNOSIS — B370 Candidal stomatitis: Secondary | ICD-10-CM | POA: Diagnosis present

## 2023-08-30 DIAGNOSIS — R051 Acute cough: Secondary | ICD-10-CM | POA: Diagnosis present

## 2023-08-30 DIAGNOSIS — J029 Acute pharyngitis, unspecified: Secondary | ICD-10-CM | POA: Diagnosis not present

## 2023-08-30 LAB — SARS CORONAVIRUS 2 BY RT PCR: SARS Coronavirus 2 by RT PCR: NEGATIVE

## 2023-08-30 LAB — GROUP A STREP BY PCR: Group A Strep by PCR: NOT DETECTED

## 2023-08-30 MED ORDER — NYSTATIN 100000 UNIT/ML MT SUSP: OROMUCOSAL | 0 refills | Status: AC

## 2023-08-30 NOTE — ED Provider Notes (Signed)
 [facility]    CSN: [id number] Arrival date & time: 08/30/23  1022      History   Chief Complaint Chief Complaint  Patient presents with   Mouth Lesions   Sore Throat    HPI Tracey White is a 43 y.o. female presenting for 2 day history of sore throat, white coating on tongue, cough, headaches, and fatigue. Feels a little short of breath. Cough is dry. Has taken OTC meds and used albuterol  inhaler. Son sick as well. Patient does not use any corticosteroid inhalers and no history of thrush.  HPI  Past Medical History:  Diagnosis Date   Anxiety    Asthma    B12 deficiency    CHF (congestive heart failure) (HCC)    a.) TTE 10/19/2015: EF 65-70%; b.) TTE 04/04/2017: EF >55%; c.) TTE 08/14/2018: EF >55%, triv TR, RVSP 37; d.) TTE 03/20/2022: EF >55%, mild LAE, triv MR/TR/PR   Chronic low back pain with sciatica    Chronic pain syndrome    Depression    Difficult intubation    x 1 with Tubal ligation-came [facility] to [facility] for tubal and anesthesia could not get her intubated-Was sent to [facility] for surgery-Has had bariatric surgery since with no issue   DOE (dyspnea on exertion)    Endometrial polyp    Gastritis    GERD (gastroesophageal reflux disease)    Hypokalemia    Lumbosacral facet joint syndrome    Lymphedema    Major depressive disorder    Menorrhagia    Microcytic anemia    Morbid obesity (HCC)    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)    OAB (overactive bladder)    OSA (obstructive sleep apnea)    a.) no longer requires nocturnal PAP therapy following significant weight loss associated with sleeve gastrectomy   Peripheral edema    Positive ANA (antinuclear antibody)    Pre-diabetes    Scoliosis    Sinus tachycardia    Status post laparoscopic sleeve gastrectomy    Vasovagal syncope    Vitamin D  deficiency     Patient Active Problem List   Diagnosis Date Noted   Chronic radicular lumbar pain 03/07/2022    DDD (degenerative disc disease), lumbosacral 11/23/2019   Spondylosis without myelopathy or radiculopathy, lumbosacral region 11/23/2019   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) 11/11/2019   Bilateral sacroiliitis (HCC) 10/28/2019   Lumbar facet syndrome (Bilateral) (R>L) 07/14/2019   Chronic sacroiliac joint pain (Right) 07/14/2019   Chronic lower extremity pain (2ry area of Pain) (Right) 07/14/2019   Chronic knee pain (3ry area of Pain) (Right) 07/14/2019   Osteoarthritis of knees (Bilateral) 07/14/2019   Chronic hip pain (4th area of Pain) (Right) 07/14/2019   Prediabetes 07/13/2019   Elevated hemoglobin A1c 07/13/2019   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   Hypertensive heart disease 07/22/2017   Long term current use of opiate analgesic 06/02/2017   Goals of care, counseling/discussion 05/07/2017   AIDS (HCC) 05/07/2017   B12 deficiency 04/16/2017   Chronic diastolic congestive heart failure (HCC) 04/02/2017   Iron  deficiency anemia 03/19/2017   Positive ANA (antinuclear antibody) 02/04/2017   Positive antinuclear antibody 02/04/2017   Neurogenic pain 01/08/2017   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   Pharmacologic therapy 01/08/2017   Problems influencing health status 01/08/2017   Chronic lower extremity pain (Bilateral) (R>L) 01/08/2017   Elevated C-reactive protein (CRP) 01/08/2017   Elevated sed rate 01/08/2017   Vitamin  D deficiency 01/08/2017   Class 3 severe obesity due to excess calories with serious comorbidity and body mass index (BMI) of 50.0 to 59.9 in adult 01/08/2017   Acute diastolic heart failure (HCC) 01/01/2017   Anemia 12/25/2016   Asthma without status asthmaticus 12/24/2016   Chronic knee pain (Bilateral) (R>L) 12/12/2016   Chronic thoracic back pain (Fourth Area of Pain) (Bilateral) (R>L) 12/12/2016   Chronic pain syndrome 12/12/2016   Disorder of skeletal system 12/12/2016   Other long term  (current) drug therapy 12/12/2016   Scoliosis 12/12/2016   Lymphedema 09/24/2016   Skin lesion of left leg 09/05/2016   Depression 08/27/2016   Chronic low back pain (1ry area of Pain) (Bilateral) (R>L) w/o sciatica 08/27/2016   Hypokalemia 06/06/2016   Chest pain 05/31/2016   Chronic diastolic heart failure (HCC) 05/15/2016   Obstructive sleep apnea 05/15/2016   Tobacco use 05/15/2016   Sinus tachycardia 10/31/2015   Chest pain at rest 10/18/2015    Past Surgical History:  Procedure Laterality Date   DILATATION & CURETTAGE/HYSTEROSCOPY WITH MYOSURE N/A 04/11/2022   Procedure: FRACTIONAL DILATATION & CURETTAGE/HYSTEROSCOPY WITH MYOSURE;  Surgeon: Schermerhorn, Debby PARAS, MD;  Location: [facility];  Service: Gynecology;  Laterality: N/A;   HERNIA REPAIR     INTRAUTERINE DEVICE (IUD) INSERTION N/A 04/11/2022   Procedure: INTRAUTERINE DEVICE (IUD) INSERTION - MIRENA ;  Surgeon: Schermerhorn, Debby PARAS, MD;  Location: [facility];  Service: Gynecology;  Laterality: N/A;   LAPAROSCOPIC GASTRIC SLEEVE RESECTION N/A 2021   scoliosis repair     x2   TUBAL LIGATION      OB History   No obstetric history on file.      Home Medications    Prior to Admission medications   Medication Sig Start Date End Date Taking? Authorizing Provider  bumetanide  (BUMEX ) 1 MG tablet Take 4 mg by mouth daily.   Yes [provider]  buPROPion (WELLBUTRIN SR) 150 MG 12 hr tablet Take 150 mg by mouth 2 (two) times daily.   Yes [provider]  medroxyPROGESTERone (PROVERA) 10 MG tablet Take 10 mg by mouth daily. 08/25/23 08/24/24 Yes [provider]  nystatin  (MYCOSTATIN ) 100000 UNIT/ML suspension Swish and hold in mouth for several seconds before swallowing or spitting every 6 hours and continue 48 hours after symptoms resolve 08/30/23  Yes Arvis Jolan NOVAK, PA-C  omeprazole (PRILOSEC) 40 MG capsule Take 40 mg by mouth daily. 07/31/23 07/25/24 Yes [provider]  ondansetron  (ZOFRAN ) 4  MG tablet Take 4 mg by mouth every 8 (eight) hours as needed for nausea. 08/13/23 08/12/24 Yes [provider]  solifenacin (VESICARE) 5 MG tablet Take 5 mg by mouth every morning.   Yes [provider]  topiramate (TOPAMAX) 25 MG tablet Take 25 mg by mouth daily. 08/29/23 07/30/24 Yes [provider]  acetaminophen  (TYLENOL ) 650 MG CR tablet Take 650 mg by mouth every 8 (eight) hours as needed for pain.    [provider]  Aspirin -Caffeine  845-65 MG PACK Take 1 packet by mouth as needed. Patient not taking: Reported on 08/30/2023    [provider]  busPIRone (BUSPAR) 7.5 MG tablet Take 7.5 mg by mouth 3 (three) times daily.    [provider]  butalbital -acetaminophen -caffeine  (FIORICET ) 50-325-40 MG tablet Take 1-2 tablets by mouth every 6 (six) hours as needed for headache. Patient not taking: Reported on 08/30/2023 05/04/23 05/03/24  Dorothyann Drivers, MD  Ferrous Sulfate (IRON  PO) Take 1 capsule by mouth daily.  [provider]  fluticasone  (FLONASE ) 50 MCG/ACT nasal spray Place 2 sprays into both nostrils as needed for allergies or rhinitis.    [provider]  HYDROXYZINE HCL PO Take 1 tablet by mouth at bedtime. Patient not taking: Reported on 08/30/2023    [provider]  levonorgestrel  (MIRENA ) 20 MCG/DAY IUD 1 each by Intrauterine route once.    [provider]  naltrexone (DEPADE) 50 MG tablet Take 50 mg by mouth at bedtime. Patient not taking: Reported on 08/30/2023    [provider]  omeprazole (PRILOSEC OTC) 20 MG tablet Take 20 mg by mouth as needed. Patient not taking: Reported on 08/30/2023    [provider]  potassium chloride  SA (KLOR-CON  M) 20 MEQ tablet Take 40 mEq by mouth 2 (two) times daily.    [provider]  predniSONE  (DELTASONE ) 10 MG tablet Take 6 tabs p.o. on day 1 and decrease by 1 tablet daily until complete Patient not taking: Reported on 08/30/2023 07/15/22    Arvis Jolan NOVAK, PA-C  pregabalin  (LYRICA ) 75 MG capsule Take 1 capsule (75 mg total) by mouth 2 (two) times daily as needed for up to 15 days. Patient not taking: Reported on 08/30/2023 07/15/22 07/30/22  Arvis Jolan B, PA-C  ipratropium (ATROVENT) 0.06 % nasal spray Place 2 sprays into both nostrils 3 (three) times daily.   05/24/20  [provider]  spironolactone (ALDACTONE) 25 MG tablet Take 25 mg by mouth daily.  05/24/20  [provider]  traMADol  (ULTRAM ) 50 MG tablet Take 1 tablet (50 mg total) by mouth every 6 (six) hours as needed for severe pain. Must last 30 days 11/15/19 05/24/20  Tanya Glisson, MD    Family History Family History  Problem Relation Age of Onset   Diabetes Mother    Hypertension Mother    Cancer Maternal Grandmother     Social History Social History   Tobacco Use   Smoking status: Every Day    Current packs/day: 0.50    Average packs/day: 0.5 packs/day for 20.0 years (10.0 ttl pk-yrs)    Types: Cigarettes   Smokeless tobacco: Never  Vaping Use   Vaping status: Never Used  Substance Use Topics   Alcohol use: Yes    Alcohol/week: 1.0 - 2.0 standard drink of alcohol    Types: 1 - 2 Standard drinks or equivalent per week    Comment: occasionally   Drug use: No     Allergies   Patient has no known allergies.   Review of Systems Review of Systems  Constitutional:  Positive for fatigue. Negative for chills, diaphoresis and fever.  HENT:  Positive for congestion, rhinorrhea and sore throat. Negative for ear pain, sinus pressure and sinus pain.   Respiratory:  Positive for cough and shortness of breath.   Cardiovascular:  Negative for chest pain.  Gastrointestinal:  Negative for abdominal pain, nausea and vomiting.  Musculoskeletal:  Negative for arthralgias and myalgias.  Skin:  Negative for rash.  Neurological:  Negative for weakness and headaches.  Hematological:  Positive for adenopathy.     Physical Exam Triage Vital  Signs ED Triage Vitals  Encounter Vitals Group     BP      Girls Systolic BP Percentile      Girls Diastolic BP Percentile      Boys Systolic BP Percentile      Boys Diastolic BP Percentile      Pulse      Resp  Temp      Temp src      SpO2      Weight      Height      Head Circumference      Peak Flow      Pain Score      Pain Loc      Pain Education      Exclude from Growth Chart    No data found.  Updated Vital Signs BP 134/87 (BP Location: Right Arm)   Pulse 77   Temp 98.3 F (36.8 C) (Oral)   LMP 07/11/2023 (Approximate)   SpO2 96%    Physical Exam Vitals and nursing note reviewed.  Constitutional:      General: She is not in acute distress.    Appearance: Normal appearance. She is not ill-appearing or toxic-appearing.  HENT:     Head: Normocephalic and atraumatic.     Nose: Nose normal.     Mouth/Throat:     Mouth: Mucous membranes are moist.     Pharynx: Oropharynx is clear. Posterior oropharyngeal erythema present.     Comments: Thick white coating on tongue. Able to scrape off with tongue scraper. Eyes:     General: No scleral icterus.       Right eye: No discharge.        Left eye: No discharge.     Conjunctiva/sclera: Conjunctivae normal.  Cardiovascular:     Rate and Rhythm: Normal rate and regular rhythm.     Heart sounds: Normal heart sounds.  Pulmonary:     Effort: Pulmonary effort is normal. No respiratory distress.     Breath sounds: Normal breath sounds.  Musculoskeletal:     Cervical back: Neck supple.  Skin:    General: Skin is dry.  Neurological:     General: No focal deficit present.     Mental Status: She is alert. Mental status is at baseline.     Motor: No weakness.     Gait: Gait normal.  Psychiatric:        Mood and Affect: Mood normal.        Behavior: Behavior normal.      UC Treatments / Results  Labs (all labs ordered are listed, but only abnormal results are displayed) Labs Reviewed  SARS CORONAVIRUS 2 BY  RT PCR  GROUP A STREP BY PCR    EKG   Radiology No results found.  Procedures Procedures (including critical care time)  Medications Ordered in UC Medications - No data to display  Initial Impression / Assessment and Plan / UC Course  I have reviewed the triage vital signs and the nursing notes.  Pertinent labs & imaging results that were available during my care of the patient were reviewed by me and considered in my medical decision making (see chart for details).   43 y/o female presents for 2 day history of sore throat, white coating on tongue, cough, fatigue and shortness of breath.  PCR COVID and strep testing obtained.   Oral thrush. Sent nystatin  mouthwash.  Advised OTC cough meds, rest and fluids.  Reviewed return precautions.   Final Clinical Impressions(s) / UC Diagnoses   Final diagnoses:  Thrush  Sore throat  Acute cough     Discharge Instructions      -Negative strep and COVId -You have thrush -Use mouthwash as directed -May take over the counter cough meds and increase rest and fluids      ED Prescriptions  Medication Sig Dispense Auth. Provider   nystatin  (MYCOSTATIN ) 100000 UNIT/ML suspension Swish and hold in mouth for several seconds before swallowing or spitting every 6 hours and continue 48 hours after symptoms resolve 473 mL Arvis Jolan NOVAK, PA-C      PDMP not reviewed this encounter.   Arvis Jolan NOVAK, PA-C 08/30/23 [phone]

## 2023-08-30 NOTE — ED Triage Notes (Signed)
 Onset 2-3 days ago.  Thick white coating to tongue, sore throat and malaise.6/7 headache.  Reports anterior lymph node tenderness bilaterally.  Also reports some SOB and dry cough starting last night.

## 2023-08-30 NOTE — Discharge Instructions (Addendum)
-  Negative strep and COVId -You have thrush -Use mouthwash as directed -May take over the counter cough meds and increase rest and fluids

## 2023-09-25 ENCOUNTER — Ambulatory Visit
Admission: EM | Admit: 2023-09-25 | Discharge: 2023-09-25 | Disposition: A | Discharge status: Home / Self Care | Attending: Family Medicine | Admitting: Family Medicine

## 2023-09-25 DIAGNOSIS — Z8709 Personal history of other diseases of the respiratory system: Secondary | ICD-10-CM | POA: Diagnosis present

## 2023-09-25 DIAGNOSIS — U071 COVID-19: Secondary | ICD-10-CM | POA: Diagnosis not present

## 2023-09-25 DIAGNOSIS — Z20822 Contact with and (suspected) exposure to covid-19: Secondary | ICD-10-CM | POA: Diagnosis present

## 2023-09-25 LAB — RESP PANEL BY RT-PCR (RSV, FLU A&B, COVID)  RVPGX2
Influenza A by PCR: NEGATIVE
Influenza B by PCR: NEGATIVE
Resp Syncytial Virus by PCR: NEGATIVE
SARS Coronavirus 2 by RT PCR: POSITIVE — AB

## 2023-09-25 MED ORDER — PREDNISONE 50 MG PO TABS: 50.0000 mg | ORAL_TABLET | Freq: Every day | ORAL | 0 refills | Status: AC

## 2023-09-25 MED ORDER — PROMETHAZINE-DM 6.25-15 MG/5ML PO SYRP: 5.0000 mL | ORAL_SOLUTION | Freq: Four times a day (QID) | ORAL | 0 refills | Status: AC | PRN

## 2023-09-25 MED ORDER — PAXLOVID (300/100) 20 X 150 MG & 10 X 100MG PO TBPK: ORAL_TABLET | ORAL | 0 refills | Status: AC

## 2023-09-25 NOTE — ED Provider Notes (Signed)
 [facility]    CSN: [id number] Arrival date & time: 09/25/23  0902      History   Chief Complaint Chief Complaint  Patient presents with   Cough   Covid Exposure    HPI Tracey White is a 43 y.o. female.   HPI  History obtained from the patient. Tracey White presents for productive cough, sore throat, headache in the past days.   She took her client to the ED yesterday and was diagnosed with COVID.  Denies fever, rhinorrhea, nasal congestion, vomiting, diarrhea. She has asthma and has been needing to use her inhaler more. Has wheezing, chest tightness and shortness of breath.  Took NyQuil without relief.      Past Medical History:  Diagnosis Date   Anxiety    Asthma    B12 deficiency    CHF (congestive heart failure) (HCC)    a.) TTE 10/19/2015: EF 65-70%; b.) TTE 04/04/2017: EF >55%; c.) TTE 08/14/2018: EF >55%, triv TR, RVSP 37; d.) TTE 03/20/2022: EF >55%, mild LAE, triv MR/TR/PR   Chronic low back pain with sciatica    Chronic pain syndrome    Depression    Difficult intubation    x 1 with Tubal ligation-came here to [facility] for tubal and anesthesia could not get her intubated-Was sent to [facility] for surgery-Has had bariatric surgery since with no issue   DOE (dyspnea on exertion)    Endometrial polyp    Gastritis    GERD (gastroesophageal reflux disease)    Hypokalemia    Lumbosacral facet joint syndrome    Lymphedema    Major depressive disorder    Menorrhagia    Microcytic anemia    Morbid obesity (HCC)    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)    OAB (overactive bladder)    OSA (obstructive sleep apnea)    a.) no longer requires nocturnal PAP therapy following significant weight loss associated with sleeve gastrectomy   Peripheral edema    Positive ANA (antinuclear antibody)    Pre-diabetes    Scoliosis    Sinus tachycardia    Status post laparoscopic sleeve gastrectomy    Vasovagal syncope    Vitamin D   deficiency     Patient Active Problem List   Diagnosis Date Noted   Chronic radicular lumbar pain 03/07/2022   DDD (degenerative disc disease), lumbosacral 11/23/2019   Spondylosis without myelopathy or radiculopathy, lumbosacral region 11/23/2019   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) 11/11/2019   Bilateral sacroiliitis (HCC) 10/28/2019   Lumbar facet syndrome (Bilateral) (R>L) 07/14/2019   Chronic sacroiliac joint pain (Right) 07/14/2019   Chronic lower extremity pain (2ry area of Pain) (Right) 07/14/2019   Chronic knee pain (3ry area of Pain) (Right) 07/14/2019   Osteoarthritis of knees (Bilateral) 07/14/2019   Chronic hip pain (4th area of Pain) (Right) 07/14/2019   Prediabetes 07/13/2019   Elevated hemoglobin A1c 07/13/2019   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   Hypertensive heart disease 07/22/2017   Long term current use of opiate analgesic 06/02/2017   Goals of care, counseling/discussion 05/07/2017   AIDS (HCC) 05/07/2017   B12 deficiency 04/16/2017   Chronic diastolic congestive heart failure (HCC) 04/02/2017   Iron  deficiency anemia 03/19/2017   Positive ANA (antinuclear antibody) 02/04/2017   Positive antinuclear antibody 02/04/2017   Neurogenic pain 01/08/2017   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   Pharmacologic therapy 01/08/2017   Problems influencing health status 01/08/2017   Chronic lower  extremity pain (Bilateral) (R>L) 01/08/2017   Elevated C-reactive protein (CRP) 01/08/2017   Elevated sed rate 01/08/2017   Vitamin D  deficiency 01/08/2017   Class 3 severe obesity due to excess calories with serious comorbidity and body mass index (BMI) of 50.0 to 59.9 in adult 01/08/2017   Acute diastolic heart failure (HCC) 01/01/2017   Anemia 12/25/2016   Asthma without status asthmaticus 12/24/2016   Chronic knee pain (Bilateral) (R>L) 12/12/2016   Chronic thoracic back pain (Fourth Area of Pain)  (Bilateral) (R>L) 12/12/2016   Chronic pain syndrome 12/12/2016   Disorder of skeletal system 12/12/2016   Other long term (current) drug therapy 12/12/2016   Scoliosis 12/12/2016   Lymphedema 09/24/2016   Skin lesion of left leg 09/05/2016   Depression 08/27/2016   Chronic low back pain (1ry area of Pain) (Bilateral) (R>L) w/o sciatica 08/27/2016   Hypokalemia 06/06/2016   Chest pain 05/31/2016   Chronic diastolic heart failure (HCC) 05/15/2016   Obstructive sleep apnea 05/15/2016   Tobacco use 05/15/2016   Sinus tachycardia 10/31/2015   Chest pain at rest 10/18/2015    Past Surgical History:  Procedure Laterality Date   DILATATION & CURETTAGE/HYSTEROSCOPY WITH MYOSURE N/A 04/11/2022   Procedure: FRACTIONAL DILATATION & CURETTAGE/HYSTEROSCOPY WITH MYOSURE;  Surgeon: Schermerhorn, Debby PARAS, MD;  Location: [facility];  Service: Gynecology;  Laterality: N/A;   HERNIA REPAIR     INTRAUTERINE DEVICE (IUD) INSERTION N/A 04/11/2022   Procedure: INTRAUTERINE DEVICE (IUD) INSERTION - MIRENA ;  Surgeon: Schermerhorn, Debby PARAS, MD;  Location: [facility];  Service: Gynecology;  Laterality: N/A;   LAPAROSCOPIC GASTRIC SLEEVE RESECTION N/A 2021   scoliosis repair     x2   TUBAL LIGATION      OB History   No obstetric history on file.      Home Medications    Prior to Admission medications   Medication Sig Start Date End Date Taking? Authorizing Provider  acetaminophen  (TYLENOL ) 650 MG CR tablet Take 650 mg by mouth every 8 (eight) hours as needed for pain.   Yes [provider]  Aspirin -Caffeine  845-65 MG PACK Take 1 packet by mouth as needed.   Yes [provider]  bumetanide  (BUMEX ) 1 MG tablet Take 4 mg by mouth daily.   Yes [provider]  buPROPion (WELLBUTRIN SR) 150 MG 12 hr tablet Take 150 mg by mouth 2 (two) times daily.   Yes [provider]  busPIRone (BUSPAR) 7.5 MG tablet Take 7.5 mg by mouth 3 (three) times daily.   Yes [provider]  butalbital -acetaminophen -caffeine  (FIORICET ) 50-325-40 MG tablet Take 1-2 tablets by mouth every 6 (six) hours as needed for headache. 05/04/23 05/03/24 Yes Dorothyann Drivers, MD  Ferrous Sulfate (IRON  PO) Take 1 capsule by mouth daily.   Yes [provider]  fluticasone  (FLONASE ) 50 MCG/ACT nasal spray Place 2 sprays into both nostrils as needed for allergies or rhinitis.   Yes [provider]  levonorgestrel  (MIRENA ) 20 MCG/DAY IUD 1 each by Intrauterine route once.   Yes [provider]  medroxyPROGESTERone (PROVERA) 10 MG tablet Take 10 mg by mouth daily. 08/25/23 08/24/24 Yes [provider]  nirmatrelvir/ritonavir (PAXLOVID , 300/100,) 20 x 150 MG & 10 x 100MG  TBPK Patient GFR is greater than 90. Take nirmatrelvir (150 mg) two tablets twice daily for 5 days and ritonavir (100 mg) one tablet twice daily for 5 days. 09/25/23  Yes Dmitry Macomber, DO  norethindrone (AYGESTIN) 5 MG tablet Take 10 mg by mouth. 09/25/23  09/24/24 Yes [provider]  nystatin  (MYCOSTATIN ) 100000 UNIT/ML suspension Swish and hold in mouth for several seconds before swallowing or spitting every 6 hours and continue 48 hours after symptoms resolve 08/30/23  Yes Arvis Jolan NOVAK, PA-C  omeprazole (PRILOSEC) 40 MG capsule Take 40 mg by mouth daily. 07/31/23 07/25/24 Yes [provider]  ondansetron  (ZOFRAN ) 4 MG tablet Take 4 mg by mouth every 8 (eight) hours as needed for nausea. 08/13/23 08/12/24 Yes [provider]  potassium chloride  SA (KLOR-CON  M) 20 MEQ tablet Take 40 mEq by mouth 2 (two) times daily.   Yes [provider]  predniSONE  (DELTASONE ) 50 MG tablet Take 1 tablet (50 mg total) by mouth daily for 5 days. 09/25/23 09/30/23 Yes Reznor Ferrando, DO  promethazine -dextromethorphan (PROMETHAZINE -DM) 6.25-15 MG/5ML syrup Take 5 mLs by mouth 4 (four) times daily as needed. 09/25/23  Yes Aydden Cumpian, DO  solifenacin (VESICARE) 5 MG tablet Take 5 mg by mouth  every morning.   Yes [provider]  topiramate (TOPAMAX) 25 MG tablet Take 25 mg by mouth daily. 08/29/23 07/30/24 Yes [provider]  HYDROXYZINE HCL PO Take 1 tablet by mouth at bedtime. Patient not taking: Reported on 08/30/2023    [provider]  naltrexone (DEPADE) 50 MG tablet Take 50 mg by mouth at bedtime. Patient not taking: Reported on 08/30/2023    [provider]  omeprazole (PRILOSEC OTC) 20 MG tablet Take 20 mg by mouth as needed. Patient not taking: Reported on 08/30/2023    [provider]  pregabalin  (LYRICA ) 75 MG capsule Take 1 capsule (75 mg total) by mouth 2 (two) times daily as needed for up to 15 days. Patient not taking: Reported on 08/30/2023 07/15/22 07/30/22  Arvis Jolan B, PA-C  ipratropium (ATROVENT) 0.06 % nasal spray Place 2 sprays into both nostrils 3 (three) times daily.   05/24/20  [provider]  spironolactone (ALDACTONE) 25 MG tablet Take 25 mg by mouth daily.  05/24/20  [provider]  traMADol  (ULTRAM ) 50 MG tablet Take 1 tablet (50 mg total) by mouth every 6 (six) hours as needed for severe pain. Must last 30 days 11/15/19 05/24/20  Tanya Glisson, MD    Family History Family History  Problem Relation Age of Onset   Diabetes Mother    Hypertension Mother    Cancer Maternal Grandmother     Social History Social History   Tobacco Use   Smoking status: Every Day    Current packs/day: 0.50    Average packs/day: 0.5 packs/day for 20.0 years (10.0 ttl pk-yrs)    Types: Cigarettes   Smokeless tobacco: Never  Vaping Use   Vaping status: Never Used  Substance Use Topics   Alcohol use: Yes    Alcohol/week: 1.0 - 2.0 standard drink of alcohol    Types: 1 - 2 Standard drinks or equivalent per week    Comment: occasionally   Drug use: No     Allergies   Patient has no known allergies.   Review of Systems Review of Systems: negative unless otherwise stated in HPI.      Physical  Exam Triage Vital Signs ED Triage Vitals  Encounter Vitals Group     BP      Girls Systolic BP Percentile      Girls Diastolic BP Percentile      Boys Systolic BP Percentile      Boys Diastolic BP Percentile      Pulse  Resp      Temp      Temp src      SpO2      Weight      Height      Head Circumference      Peak Flow      Pain Score      Pain Loc      Pain Education      Exclude from Growth Chart    No data found.  Updated Vital Signs BP (!) 144/91 (BP Location: Left Arm)   Pulse 82   Temp 98.7 F (37.1 C) (Oral)   Resp 16   Ht 5' 1 (1.549 m)   Wt 119.3 kg   LMP 09/16/2023 (Approximate)   SpO2 92%   BMI 49.69 kg/m   Visual Acuity Right Eye Distance:   Left Eye Distance:   Bilateral Distance:    Right Eye Near:   Left Eye Near:    Bilateral Near:     Physical Exam GEN:     alert, non-toxic appearing female in no distress    HENT:  mucus membranes moist, oropharyngeal without lesions, +erythema, no tonsillar hypertrophy or exudates, no nasal discharge, EYES:   pupils equal and reactive, no scleral injection or discharge NECK:  normal ROM, no meningismus   RESP:  no increased work of breathing, clear to auscultation bilaterally CVS:   regular rate and rhythm Skin:   warm and dry, no rash on visible skin    UC Treatments / Results  Labs (all labs ordered are listed, but only abnormal results are displayed) Labs Reviewed  RESP PANEL BY RT-PCR (RSV, FLU A&B, COVID)  RVPGX2 - Abnormal; Notable for the following components:      Result Value   SARS Coronavirus 2 by RT PCR POSITIVE (*)    All other components within normal limits    EKG   Radiology No results found.   Procedures Procedures (including critical care time)  Medications Ordered in UC Medications - No data to display  Initial Impression / Assessment and Plan / UC Course  I have reviewed the triage vital signs and the nursing notes.  Pertinent labs & imaging results that  were available during my care of the patient were reviewed by me and considered in my medical decision making (see chart for details).       Pt is a 43 y.o. female who presents for  respiratory symptoms. Tracey White is afebrile here without recent antipyretics. Satting well on room air. Overall pt is non-toxic appearing, well hydrated, without respiratory distress. Pulmonary exam is unremarkable.  COVID, RSV and influenza panel obtained. I will call patient with test results. History consistent with viral respiratory illness. Discussed symptomatic treatment. Typical duration of symptoms discussed. She has asthma with symptoms of pending exacerbation. Prednisone  prescribed. Albuterol  refill not needed, per pt.     Return and ED precautions given and voiced understanding. Discussed MDM, treatment plan and plan for follow-up with patient who agrees with plan.   Called pt to discuss her respiratory panel.  She is COVID-positive.  We discussed Paxlovid  treatment and she is agreeable.  Serum creatinine in the last year was normal.  Paxlovid  prescription sent to the pharmacy.  She already has a work note.  Current CDC recommendations discussed.  Final Clinical Impressions(s) / UC Diagnoses   Final diagnoses:  Exposure to COVID-19 virus  History of asthma  COVID-19     Discharge Instructions      We  will contact you regarding your RSV, influenza and COVID test results.    You can take Tylenol  and/or Ibuprofen  as needed for fever reduction and pain relief.    For cough: honey 1/2 to 1 teaspoon (you can dilute the honey in water or another fluid).  Stop at the pharmacy to pick up your prescription cough medication. You can use a humidifier for chest congestion and cough.  If you don't have a humidifier, you can sit in the bathroom with the hot shower running.      For sore throat: try warm salt water gargles, Mucinex  sore throat cough drops or cepacol lozenges, throat spray, warm tea or water with  lemon/honey, popsicles or ice, or OTC cold relief medicine for throat discomfort. You can also purchase chloraseptic spray at the pharmacy or dollar store.   For congestion: take a daily anti-histamine like Zyrtec, Claritin, and a oral decongestant, such as pseudoephedrine.  You can also use Flonase  1-2 sprays in each nostril daily. Afrin is also a good option, if you do not have high blood pressure.    It is important to stay hydrated: drink plenty of fluids (water, gatorade/powerade/pedialyte, juices, or teas) to keep your throat moisturized and help further relieve irritation/discomfort.    Return or go to the Emergency Department if symptoms worsen or do not improve in the next few days      ED Prescriptions     Medication Sig Dispense Auth. Provider   predniSONE  (DELTASONE ) 50 MG tablet Take 1 tablet (50 mg total) by mouth daily for 5 days. 5 tablet Jovie Swanner, DO   promethazine -dextromethorphan (PROMETHAZINE -DM) 6.25-15 MG/5ML syrup Take 5 mLs by mouth 4 (four) times daily as needed. 118 mL Kenneth Cuaresma, DO   nirmatrelvir/ritonavir (PAXLOVID , 300/100,) 20 x 150 MG & 10 x 100MG  TBPK Patient GFR is greater than 90. Take nirmatrelvir (150 mg) two tablets twice daily for 5 days and ritonavir (100 mg) one tablet twice daily for 5 days. 30 tablet Alinah Sheard, DO      PDMP not reviewed this encounter.   Marishka Rentfrow, DO 09/25/23 1200

## 2023-09-25 NOTE — Discharge Instructions (Addendum)
 We will contact you regarding your RSV, influenza and COVID test results.    You can take Tylenol  and/or Ibuprofen  as needed for fever reduction and pain relief.    For cough: honey 1/2 to 1 teaspoon (you can dilute the honey in water or another fluid).  Stop at the pharmacy to pick up your prescription cough medication. You can use a humidifier for chest congestion and cough.  If you don't have a humidifier, you can sit in the bathroom with the hot shower running.      For sore throat: try warm salt water gargles, Mucinex  sore throat cough drops or cepacol lozenges, throat spray, warm tea or water with lemon/honey, popsicles or ice, or OTC cold relief medicine for throat discomfort. You can also purchase chloraseptic spray at the pharmacy or dollar store.   For congestion: take a daily anti-histamine like Zyrtec, Claritin, and a oral decongestant, such as pseudoephedrine.  You can also use Flonase  1-2 sprays in each nostril daily. Afrin is also a good option, if you do not have high blood pressure.    It is important to stay hydrated: drink plenty of fluids (water, gatorade/powerade/pedialyte, juices, or teas) to keep your throat moisturized and help further relieve irritation/discomfort.    Return or go to the Emergency Department if symptoms worsen or do not improve in the next few days

## 2023-09-25 NOTE — ED Triage Notes (Signed)
 Pt c/o covid exposure  Pt states that she has asthma, and is experiencing cough, congestion, trouble breathing upon waking, headache x1week  Pt states that she was here 2 weeks ago due to thrush and sore throat. 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

## 2023-09-26 ENCOUNTER — Ambulatory Visit (HOSPITAL_COMMUNITY): Payer: Self-pay
